# Patient Record
Sex: Female | Born: 1945 | Race: Black or African American | Hispanic: No | Marital: Married | Smoking: Former smoker
Health system: Southern US, Community
[De-identification: ages and names within clinical notes are randomized; demographics above are authoritative.]

## PROBLEM LIST (undated history)

## (undated) DIAGNOSIS — C3490 Malignant neoplasm of unspecified part of unspecified bronchus or lung: Secondary | ICD-10-CM

## (undated) DIAGNOSIS — C140 Malignant neoplasm of pharynx, unspecified: Secondary | ICD-10-CM

## (undated) DIAGNOSIS — I10 Essential (primary) hypertension: Secondary | ICD-10-CM

## (undated) DIAGNOSIS — N189 Chronic kidney disease, unspecified: Secondary | ICD-10-CM

## (undated) DIAGNOSIS — I4891 Unspecified atrial fibrillation: Secondary | ICD-10-CM

## (undated) DIAGNOSIS — D649 Anemia, unspecified: Secondary | ICD-10-CM

## (undated) HISTORY — DX: Chronic kidney disease, unspecified: N18.9

## (undated) HISTORY — DX: Malignant neoplasm of unspecified part of unspecified bronchus or lung: C34.90

## (undated) HISTORY — PX: OTHER SURGICAL HISTORY: SHX169

## (undated) HISTORY — DX: Unspecified atrial fibrillation: I48.91

---

## 1997-11-27 ENCOUNTER — Other Ambulatory Visit: Admission: RE | Admit: 1997-11-27 | Discharge: 1997-11-27 | Payer: Self-pay | Admitting: Obstetrics & Gynecology

## 1998-12-10 ENCOUNTER — Other Ambulatory Visit: Admission: RE | Admit: 1998-12-10 | Discharge: 1998-12-10 | Payer: Self-pay | Admitting: Internal Medicine

## 1999-10-18 ENCOUNTER — Ambulatory Visit (HOSPITAL_COMMUNITY): Admission: RE | Admit: 1999-10-18 | Discharge: 1999-10-18 | Payer: Self-pay | Admitting: Internal Medicine

## 1999-10-18 ENCOUNTER — Encounter: Payer: Self-pay | Admitting: Internal Medicine

## 1999-12-10 ENCOUNTER — Other Ambulatory Visit: Admission: RE | Admit: 1999-12-10 | Discharge: 1999-12-10 | Payer: Self-pay | Admitting: Internal Medicine

## 2001-02-12 ENCOUNTER — Other Ambulatory Visit: Admission: RE | Admit: 2001-02-12 | Discharge: 2001-02-12 | Payer: Self-pay | Admitting: Internal Medicine

## 2005-02-07 ENCOUNTER — Ambulatory Visit (HOSPITAL_COMMUNITY): Admission: RE | Admit: 2005-02-07 | Discharge: 2005-02-07 | Payer: Self-pay | Admitting: Internal Medicine

## 2010-08-28 ENCOUNTER — Encounter: Payer: Self-pay | Admitting: Nephrology

## 2010-11-09 ENCOUNTER — Emergency Department (HOSPITAL_COMMUNITY): Payer: Medicare Other

## 2010-11-09 ENCOUNTER — Inpatient Hospital Stay (HOSPITAL_COMMUNITY)
Admission: EM | Admit: 2010-11-09 | Discharge: 2010-11-29 | DRG: 207 | Disposition: A | Discharge status: Skilled Nursing Facility | Payer: Medicare Other | Attending: Emergency Medicine | Admitting: Emergency Medicine

## 2010-11-09 DIAGNOSIS — G589 Mononeuropathy, unspecified: Secondary | ICD-10-CM | POA: Diagnosis present

## 2010-11-09 DIAGNOSIS — N179 Acute kidney failure, unspecified: Secondary | ICD-10-CM | POA: Diagnosis present

## 2010-11-09 DIAGNOSIS — J90 Pleural effusion, not elsewhere classified: Secondary | ICD-10-CM | POA: Diagnosis present

## 2010-11-09 DIAGNOSIS — I4891 Unspecified atrial fibrillation: Secondary | ICD-10-CM

## 2010-11-09 DIAGNOSIS — E872 Acidosis, unspecified: Secondary | ICD-10-CM | POA: Diagnosis present

## 2010-11-09 DIAGNOSIS — C3490 Malignant neoplasm of unspecified part of unspecified bronchus or lung: Secondary | ICD-10-CM | POA: Diagnosis present

## 2010-11-09 DIAGNOSIS — I498 Other specified cardiac arrhythmias: Secondary | ICD-10-CM | POA: Diagnosis not present

## 2010-11-09 DIAGNOSIS — J9600 Acute respiratory failure, unspecified whether with hypoxia or hypercapnia: Secondary | ICD-10-CM | POA: Diagnosis not present

## 2010-11-09 DIAGNOSIS — E876 Hypokalemia: Secondary | ICD-10-CM | POA: Diagnosis present

## 2010-11-09 DIAGNOSIS — R197 Diarrhea, unspecified: Secondary | ICD-10-CM | POA: Diagnosis present

## 2010-11-09 DIAGNOSIS — I5020 Unspecified systolic (congestive) heart failure: Secondary | ICD-10-CM | POA: Diagnosis present

## 2010-11-09 DIAGNOSIS — R222 Localized swelling, mass and lump, trunk: Secondary | ICD-10-CM | POA: Diagnosis present

## 2010-11-09 DIAGNOSIS — A419 Sepsis, unspecified organism: Secondary | ICD-10-CM | POA: Diagnosis not present

## 2010-11-09 DIAGNOSIS — I10 Essential (primary) hypertension: Secondary | ICD-10-CM | POA: Diagnosis present

## 2010-11-09 DIAGNOSIS — D649 Anemia, unspecified: Secondary | ICD-10-CM | POA: Diagnosis present

## 2010-11-09 DIAGNOSIS — E46 Unspecified protein-calorie malnutrition: Secondary | ICD-10-CM | POA: Diagnosis present

## 2010-11-09 DIAGNOSIS — D696 Thrombocytopenia, unspecified: Secondary | ICD-10-CM | POA: Diagnosis present

## 2010-11-09 DIAGNOSIS — F410 Panic disorder [episodic paroxysmal anxiety] without agoraphobia: Secondary | ICD-10-CM | POA: Diagnosis not present

## 2010-11-09 DIAGNOSIS — R5381 Other malaise: Secondary | ICD-10-CM | POA: Diagnosis present

## 2010-11-09 DIAGNOSIS — F17200 Nicotine dependence, unspecified, uncomplicated: Secondary | ICD-10-CM | POA: Diagnosis present

## 2010-11-09 DIAGNOSIS — R6521 Severe sepsis with septic shock: Secondary | ICD-10-CM | POA: Diagnosis not present

## 2010-11-09 DIAGNOSIS — I509 Heart failure, unspecified: Secondary | ICD-10-CM | POA: Diagnosis present

## 2010-11-09 DIAGNOSIS — J189 Pneumonia, unspecified organism: Principal | ICD-10-CM | POA: Diagnosis present

## 2010-11-09 DIAGNOSIS — R634 Abnormal weight loss: Secondary | ICD-10-CM | POA: Diagnosis present

## 2010-11-09 HISTORY — DX: Malignant neoplasm of pharynx, unspecified: C14.0

## 2010-11-09 HISTORY — DX: Anemia, unspecified: D64.9

## 2010-11-09 HISTORY — DX: Essential (primary) hypertension: I10

## 2010-11-09 LAB — OCCULT BLOOD, POC DEVICE: Fecal Occult Bld: NEGATIVE

## 2010-11-09 LAB — BASIC METABOLIC PANEL
BUN: 28 mg/dL — ABNORMAL HIGH (ref 6–23)
CO2: 17 mEq/L — ABNORMAL LOW (ref 19–32)
CO2: 15 mEq/L — ABNORMAL LOW (ref 19–32)
Calcium: 6.6 mg/dL — ABNORMAL LOW (ref 8.4–10.5)
Chloride: 106 mEq/L (ref 96–112)
Chloride: 116 mEq/L — ABNORMAL HIGH (ref 96–112)
Creatinine, Ser: 1.72 mg/dL — ABNORMAL HIGH (ref 0.4–1.2)
Creatinine, Ser: 1.59 mg/dL — ABNORMAL HIGH (ref 0.4–1.2)
GFR calc Af Amer: 36 mL/min — ABNORMAL LOW (ref >60)
GFR calc Af Amer: 39 mL/min — ABNORMAL LOW (ref >60)
GFR calc non Af Amer: 32 mL/min — ABNORMAL LOW (ref >60)
Potassium: <2 mEq/L — CL (ref 3.5–5.1)
Potassium: 2.4 mEq/L — CL (ref 3.5–5.1)
Sodium: 138 mEq/L (ref 135–145)
Sodium: 138 mEq/L (ref 135–145)

## 2010-11-09 LAB — DIFFERENTIAL
Basophils Absolute: 0 10*3/uL (ref 0.0–0.1)
Basophils Relative: 0 % (ref 0–1)
Eosinophils Absolute: 0 10*3/uL (ref 0.0–0.7)
Eosinophils Relative: 0 % (ref 0–5)
Lymphocytes Relative: 30 % (ref 12–46)
Lymphs Abs: 2.1 10*3/uL (ref 0.7–4.0)
Monocytes Absolute: 1 10*3/uL (ref 0.1–1.0)
Monocytes Relative: 14 % — ABNORMAL HIGH (ref 3–12)
Neutro Abs: 3.8 10*3/uL (ref 1.7–7.7)
Neutrophils Relative %: 55 % (ref 43–77)

## 2010-11-09 LAB — CBC
HCT: 26.2 % — ABNORMAL LOW (ref 36.0–46.0)
Hemoglobin: 8.5 g/dL — ABNORMAL LOW (ref 12.0–15.0)
MCH: 29.8 pg (ref 26.0–34.0)
MCHC: 32.4 g/dL (ref 30.0–36.0)
MCV: 91.9 fL (ref 78.0–100.0)
Platelets: 250 10*3/uL (ref 150–400)
RBC: 2.85 MIL/uL — ABNORMAL LOW (ref 3.87–5.11)
RDW: 17.7 % — ABNORMAL HIGH (ref 11.5–15.5)
WBC: 6.9 10*3/uL (ref 4.0–10.5)

## 2010-11-09 LAB — HEPATIC FUNCTION PANEL
ALT: 17 U/L (ref 0–35)
AST: 31 U/L (ref 0–37)
Indirect Bilirubin: 0.3 mg/dL (ref 0.3–0.9)
Total Protein: 7.1 g/dL (ref 6.0–8.3)

## 2010-11-09 LAB — TSH: TSH: 1.612 u[IU]/mL (ref 0.350–4.500)

## 2010-11-09 LAB — T4, FREE: Free T4: 1.43 ng/dL (ref 0.80–1.80)

## 2010-11-09 LAB — MRSA PCR SCREENING: MRSA by PCR: NEGATIVE

## 2010-11-10 ENCOUNTER — Inpatient Hospital Stay (HOSPITAL_COMMUNITY): Payer: Medicare Other

## 2010-11-10 LAB — BASIC METABOLIC PANEL
BUN: 18 mg/dL (ref 6–23)
CO2: 14 mEq/L — ABNORMAL LOW (ref 19–32)
Calcium: 6.4 mg/dL — CL (ref 8.4–10.5)
Calcium: 6.9 mg/dL — ABNORMAL LOW (ref 8.4–10.5)
Chloride: 119 mEq/L — ABNORMAL HIGH (ref 96–112)
Chloride: 118 mEq/L — ABNORMAL HIGH (ref 96–112)
Creatinine, Ser: 1.48 mg/dL — ABNORMAL HIGH (ref 0.4–1.2)
GFR calc Af Amer: 40 mL/min — ABNORMAL LOW (ref >60)
GFR calc Af Amer: 43 mL/min — ABNORMAL LOW (ref >60)
GFR calc Af Amer: 45 mL/min — ABNORMAL LOW (ref >60)
GFR calc non Af Amer: 33 mL/min — ABNORMAL LOW (ref >60)
GFR calc non Af Amer: 35 mL/min — ABNORMAL LOW (ref >60)
Glucose, Bld: 92 mg/dL (ref 70–99)
Potassium: 2.2 mEq/L — CL (ref 3.5–5.1)
Potassium: 2.3 mEq/L — CL (ref 3.5–5.1)
Potassium: 2.8 mEq/L — ABNORMAL LOW (ref 3.5–5.1)
Sodium: 139 mEq/L (ref 135–145)
Sodium: 141 mEq/L (ref 135–145)

## 2010-11-10 LAB — ALBUMIN: Albumin: 1.7 g/dL — ABNORMAL LOW (ref 3.5–5.2)

## 2010-11-10 LAB — CBC
HCT: 28.4 % — ABNORMAL LOW (ref 36.0–46.0)
Hemoglobin: 6.4 g/dL — CL (ref 12.0–15.0)
Hemoglobin: 9.6 g/dL — ABNORMAL LOW (ref 12.0–15.0)
Platelets: 194 10*3/uL (ref 150–400)
RBC: 2.11 MIL/uL — ABNORMAL LOW (ref 3.87–5.11)
RBC: 3.26 MIL/uL — ABNORMAL LOW (ref 3.87–5.11)
WBC: 8.5 10*3/uL (ref 4.0–10.5)

## 2010-11-10 LAB — GIARDIA/CRYPTOSPORIDIUM SCREEN(EIA): Cryptosporidium Screen (EIA): NEGATIVE

## 2010-11-10 LAB — MAGNESIUM: Magnesium: 1.5 mg/dL (ref 1.5–2.5)

## 2010-11-10 NOTE — H&P (Signed)
  NAME:  Madison [name], Madison [name]                 ACCOUNT NO.:  [id number]  MEDICAL RECORD NO.:  [id number]           PATIENT TYPE:  E  LOCATION:  [facility]                         FACILITY:  [facility]  PHYSICIAN:  Mauro Kaufmann, MD         DATE OF BIRTH:  [birth year]  DATE OF ADMISSION:  11/09/2010 DATE OF DISCHARGE:                             HISTORY & PHYSICAL   ADDENDUM: Please make a note that, the patient has been having cough for past 2 weeks, but denies any fever and so once the patient's CAT scan is done and it is confirmed that she has mass and not pneumonia, we will get Pulmonary consultation.  If the CT scan shows pneumonia, then we will put the patient on antibiotics.     Mauro Kaufmann, MD     GL/MEDQ  D:  11/09/2010  T:  11/09/2010  Job:  [id number]  Electronically Signed by Mauro Kaufmann  on 11/10/2010 02:56:26 PM

## 2010-11-10 NOTE — H&P (Signed)
NAMESERA, HITSMAN                 ACCOUNT NO.:  [id number]  MEDICAL RECORD NO.:  [id number]           PATIENT TYPE:  E  LOCATION:  [facility]                         FACILITY:  [facility]  PHYSICIAN:  Mauro Kaufmann, MD         DATE OF BIRTH:  14-Jul-1946  DATE OF ADMISSION:  11/09/2010 DATE OF DISCHARGE:                             HISTORY & PHYSICAL   PRIMARY CARE PHYSICIAN:  Halford Chessman at [facility].  CHIEF COMPLAINT:  Abnormal labs.  HISTORY OF PRESENT ILLNESS:  This is a 65 year old female who was sent to the ER because of abnormal labs.  The patient was seen by her primary care yesterday and the lab work was done which showed a potassium of 2.0 and the patient was on call to come to the hospital so the patient came to the hospital.  The patient tells me that she has been on hydrochlorothiazide for the hypertension along with amlodipine, Cozaar, losartan by the primary care physician.  The hydrochlorothiazide which could have resulted in the hypokalemia was discontinued yesterday.  The patient also has a history of chronic watery diarrhea and unintentional weight loss going on for past 5 months.  The patient says that she has usually has 4-5 loose bowel movements every day and she blames this diarrhea for the unintentional weight loss.  The patient says that she has lost about 20 pounds of weight since December of last year.  Denies any complaints.  No chest pain.  No shortness of breath.  No nausea, vomiting.  No headache.  No blurred vision.  No passing out spell.  No dysuria, urgency. No frequency of urination.  No cough.  No shortness of breath.  PAST MEDICAL HISTORY: 1. Significant for hypertension. 2. Anemia. 3. Neuropathy.  PAST SURGICAL HISTORY:  None.  SOCIAL HISTORY:  The patient smokes one pack of cigarettes per day.  No history of alcohol abuse.  No history of illicit drug abuse.  FAMILY HISTORY:  There is positive family history of coronary artery disease in  both the parents and the patient's first cousin has a history of cancer.  The patient is not sure which cancer.  ALLERGIES:  LISINOPRIL WHICH CAUSES ANGIOEDEMA.  MEDICATIONS: 1. Amlodipine 10 mg p.o. daily. 2. Cozaar 100 mg p.o. daily. 3. Losartan 10 mg p.o. daily. 4. Folic acid 2 mg p.o. daily. 5. Multivitamin 1 p.o. daily. 6. Vitamin B1 1000 units once a day.  REVIEW OF SYSTEMS:  HEENT:  There is no headache, no blurred vision.  No runny nose or sore throat. NECK:  No history of thyroid problems. CHEST:  No history of COPD or emphysema. HEART:  No history of CAD in the past. GI: As in HPI. GENITOURINARY:  No dysuria, urgency, frequency of urination. NEUROLOGICAL:  No history of stroke or TIA in the past.  PHYSICAL EXAMINATION:  VITAL SIGNS:  The patient's blood pressure is 95/54, temperature is 98.0, pulse of 94, respiration is 19, O2 sat 100%. HEENT:  Head is atraumatic, normocephalic.  Eyes: Extraocular muscles intact.  Oral mucosa is pink and moist. NECK:  Supple. CHEST:  Clear to auscultation bilaterally. HEART:  S1 and S2 is regular.  No murmurs, rubs, or gallops. ABDOMEN:  Soft, nontender.  No organomegaly. EXTREMITIES:  No cyanosis or clubbing or edema. NEUROLOGICAL:  Cranial nerves II-XII grossly intact.  Motor strength is 5/5 in both upper and lower extremities.  Sensations are intact.  PERTINENT LABS:  WBCs 6.9, hemoglobin 8.5, hematocrit 26.2, platelet count 250.  Sodium is 138, potassium less than 2.0, chloride 106, CO2 19, BUN 28, creatinine 2.10, glucose is 89, AST 31, ALT 17, calcium is 7.6, albumin is 2.4.  Stool for occult blood is negative.  EKG shows atrial fibrillation with a rate of 79-86.  ASSESSMENT: 1. New onset atrial fibrillation. 2. Hypokalemia. 3. Lung mass. 4. Chronic diarrhea. 5. History of hypertension. 6. Weight loss. 7. Anemia. 8. Acute renal failure. 9. Tobacco abuse.  PLAN: 1. New onset atrial fibrillation.  The patient did not  have AFib in     the past and this is a new onset.  So at this time the rate is     pretty well controlled so I am going to start the patient on p.o.     Cardizem and start the patient on Lovenox 1 mg/kg subcu q.12 h.  I     called the cardiology consultation.  The AFib at this time could     have been triggered by the low potassium so the potassium will be     replaced. 2. Hypokalemia.  The patient's potassium is less than 2.0.  We will     give her 10 mEq IV x3 and also repeat a BMET today and also check     her magnesium levels. 3. Questionable lung mass.  The chest x-ray done today shows right     hilar enlargement and wedge-shaped anterior right upper lobe     airspace disease.  This could reflect combination of acute     pneumonia with right hilar lymphadenopathy or alternatively     bronchogenic carcinoma.  The patient does not have any sinus     symptoms of pneumonia at this time.  Her white count is normal.     She did not have any fever and with her history of 20 pounds of     weight loss to over past 4 months the bronchogenic carcinoma is     very likely.  So the patient will need a CT of the chest with     contrast, but unfortunately her creatinine is elevated this time so     we cannot obtain CT till the creatinine is stabilized.  I am going     to repeat a BMET and put the order for a CT of the chest with     contrast once the creatinine is less than 1.5.  The patient will go     down for CAT of the chest to rule out any bronchogenic carcinoma.     In the meantime, the patient will be admitted to the step-down unit     as the patient is mildly hypotensive, which most likely is     secondary to dehydration.  I am not going to start the antibiotics     till I get the CAT scan of the chest report showing definite     pneumonia which I doubt at this time. 4. Chronic diarrhea.  The patient has been having diarrhea for last 4     months and we are going to obtain the  stool  studies also check the     PCR C diff.  The patient may need a CT of the abdomen and pelvis     later once the creatinine is stabilized and other acute issues are     taken care of. 5. History of hypertension.  At this time the patient is hypotensive,     so we are going to hold all the antihypertensive medications.  The     patient was taking hydrochlorothiazide which was recently     discontinued by the patient's primary care provider. 6. Weight-loss.  This is an unintentional weight loss and all could be     explained due to the bronchogenic carcinoma.  We will wait for the     CT of the chest.  If the CT of the chest does not show bronchogenic     carcinoma, then further evaluation has to be done for the patient's     chronic diarrhea and further imaging of the abdomen will be done as     necessary. 7. Anemia.  The patient's stool for occult blood is negative in the     ER.  We are going to obtain 3 more stool for occult blood and this     anemia could be all anemia of chronic disease secondary to the     cancer. 8. Acute renal failure.  The patient will be started on IV fluids for     the acute renal failure.  We will check the patient's BMET in the     a.m.     Mauro Kaufmann, MD     GL/MEDQ  D:  11/09/2010  T:  11/09/2010  Job:  [id number]  Electronically Signed by Mauro Kaufmann  on 11/10/2010 02:56:20 PM

## 2010-11-11 ENCOUNTER — Other Ambulatory Visit: Payer: Self-pay | Admitting: Emergency Medicine

## 2010-11-11 ENCOUNTER — Inpatient Hospital Stay (HOSPITAL_COMMUNITY): Payer: Medicare Other

## 2010-11-11 DIAGNOSIS — C3490 Malignant neoplasm of unspecified part of unspecified bronchus or lung: Secondary | ICD-10-CM

## 2010-11-11 DIAGNOSIS — E871 Hypo-osmolality and hyponatremia: Secondary | ICD-10-CM

## 2010-11-11 DIAGNOSIS — R222 Localized swelling, mass and lump, trunk: Secondary | ICD-10-CM

## 2010-11-11 DIAGNOSIS — E782 Mixed hyperlipidemia: Secondary | ICD-10-CM

## 2010-11-11 DIAGNOSIS — J189 Pneumonia, unspecified organism: Secondary | ICD-10-CM

## 2010-11-11 DIAGNOSIS — J9600 Acute respiratory failure, unspecified whether with hypoxia or hypercapnia: Secondary | ICD-10-CM

## 2010-11-11 DIAGNOSIS — I059 Rheumatic mitral valve disease, unspecified: Secondary | ICD-10-CM

## 2010-11-11 HISTORY — DX: Malignant neoplasm of unspecified part of unspecified bronchus or lung: C34.90

## 2010-11-11 LAB — GLUCOSE, CAPILLARY
Glucose-Capillary: 160 mg/dL — ABNORMAL HIGH (ref 70–99)
Glucose-Capillary: 320 mg/dL — ABNORMAL HIGH (ref 70–99)
Glucose-Capillary: 285 mg/dL — ABNORMAL HIGH (ref 70–99)

## 2010-11-11 LAB — BASIC METABOLIC PANEL
BUN: 10 mg/dL (ref 6–23)
CO2: 11 mEq/L — ABNORMAL LOW (ref 19–32)
Chloride: 120 mEq/L — ABNORMAL HIGH (ref 96–112)
Chloride: 119 mEq/L — ABNORMAL HIGH (ref 96–112)
Chloride: 116 mEq/L — ABNORMAL HIGH (ref 96–112)
GFR calc Af Amer: 55 mL/min — ABNORMAL LOW (ref >60)
GFR calc Af Amer: 54 mL/min — ABNORMAL LOW (ref >60)
GFR calc non Af Amer: 45 mL/min — ABNORMAL LOW (ref >60)
GFR calc non Af Amer: 41 mL/min — ABNORMAL LOW (ref >60)
Glucose, Bld: 133 mg/dL — ABNORMAL HIGH (ref 70–99)
Glucose, Bld: 361 mg/dL — ABNORMAL HIGH (ref 70–99)
Potassium: 4.1 mEq/L (ref 3.5–5.1)
Potassium: 4.5 mEq/L (ref 3.5–5.1)
Potassium: 4.3 mEq/L (ref 3.5–5.1)
Sodium: 139 mEq/L (ref 135–145)
Sodium: 137 mEq/L (ref 135–145)

## 2010-11-11 LAB — BLOOD GAS, ARTERIAL
Acid-base deficit: 17.1 mmol/L — ABNORMAL HIGH (ref 0.0–2.0)
Drawn by: 277551
O2 Content: 2 L/min
O2 Saturation: 83.7 %
TCO2: 9.5 mmol/L (ref 0–100)
pCO2 arterial: 20.5 mmHg — ABNORMAL LOW (ref 35.0–45.0)
pO2, Arterial: 57 mmHg — ABNORMAL LOW (ref 80.0–100.0)

## 2010-11-11 LAB — COMPREHENSIVE METABOLIC PANEL
AST: 38 U/L — ABNORMAL HIGH (ref 0–37)
Albumin: 2.7 g/dL — ABNORMAL LOW (ref 3.5–5.2)
Alkaline Phosphatase: 66 U/L (ref 39–117)
BUN: 10 mg/dL (ref 6–23)
CO2: 13 mEq/L — ABNORMAL LOW (ref 19–32)
Chloride: 118 mEq/L — ABNORMAL HIGH (ref 96–112)
Creatinine, Ser: 1.3 mg/dL — ABNORMAL HIGH (ref 0.4–1.2)
GFR calc Af Amer: 50 mL/min — ABNORMAL LOW (ref >60)
GFR calc non Af Amer: 41 mL/min — ABNORMAL LOW (ref >60)
Potassium: 2.9 mEq/L — ABNORMAL LOW (ref 3.5–5.1)
Total Bilirubin: 1.1 mg/dL (ref 0.3–1.2)

## 2010-11-11 LAB — CBC
HCT: 29.4 % — ABNORMAL LOW (ref 36.0–46.0)
Hemoglobin: 10.1 g/dL — ABNORMAL LOW (ref 12.0–15.0)
Hemoglobin: 9.8 g/dL — ABNORMAL LOW (ref 12.0–15.0)
MCH: 29.7 pg (ref 26.0–34.0)
MCH: 29.6 pg (ref 26.0–34.0)
MCV: 87.6 fL (ref 78.0–100.0)
MCV: 88.8 fL (ref 78.0–100.0)
Platelets: 213 10*3/uL (ref 150–400)
RBC: 3.4 MIL/uL — ABNORMAL LOW (ref 3.87–5.11)
RBC: 3.31 MIL/uL — ABNORMAL LOW (ref 3.87–5.11)
WBC: 8.8 10*3/uL (ref 4.0–10.5)
WBC: 15.3 10*3/uL — ABNORMAL HIGH (ref 4.0–10.5)

## 2010-11-11 LAB — POCT I-STAT 3, ART BLOOD GAS (G3+)
Acid-base deficit: 17 mmol/L — ABNORMAL HIGH (ref 0.0–2.0)
O2 Saturation: 88 %
Patient temperature: 35
TCO2: 11 mmol/L (ref 0–100)

## 2010-11-11 LAB — APTT: aPTT: 33 seconds (ref 24–37)

## 2010-11-11 LAB — IRON AND TIBC
Iron: 35 ug/dL — ABNORMAL LOW (ref 42–135)
Saturation Ratios: 31 % (ref 20–55)
UIBC: 79 ug/dL

## 2010-11-11 LAB — CARBOXYHEMOGLOBIN
Methemoglobin: 0.7 % (ref 0.0–1.5)
Methemoglobin: 0.4 % (ref 0.0–1.5)

## 2010-11-11 LAB — CARDIAC PANEL(CRET KIN+CKTOT+MB+TROPI)
CK, MB: 2.5 ng/mL (ref 0.3–4.0)
Relative Index: INVALID (ref 0.0–2.5)
Troponin I: 0.11 ng/mL — ABNORMAL HIGH (ref 0.00–0.06)

## 2010-11-11 LAB — LACTIC ACID, PLASMA: Lactic Acid, Venous: 3.3 mmol/L — ABNORMAL HIGH (ref 0.5–2.2)

## 2010-11-11 LAB — FERRITIN: Ferritin: 824 ng/mL — ABNORMAL HIGH (ref 10–291)

## 2010-11-11 LAB — PROCALCITONIN: Procalcitonin: 0.6 ng/mL

## 2010-11-11 MED ORDER — TECHNETIUM TO 99M ALBUMIN AGGREGATED
6.0000 | Freq: Once | INTRAVENOUS | Status: AC | PRN
  Administered 2010-11-11: 6 via INTRAVENOUS

## 2010-11-11 MED ORDER — XENON XE 133 GAS
10.0000 | GAS_FOR_INHALATION | Freq: Once | RESPIRATORY_TRACT | Status: AC | PRN
  Administered 2010-11-11: 10 via RESPIRATORY_TRACT

## 2010-11-12 ENCOUNTER — Inpatient Hospital Stay (HOSPITAL_COMMUNITY): Payer: Medicare Other

## 2010-11-12 LAB — BLOOD GAS, ARTERIAL
Acid-base deficit: 11.8 mmol/L — ABNORMAL HIGH (ref 0.0–2.0)
Drawn by: 31101
FIO2: 0.6 %
MECHVT: 500 mL
O2 Saturation: 89.4 %
PEEP: 5 cmH2O
RATE: 22 resp/min
pCO2 arterial: 26.2 mmHg — ABNORMAL LOW (ref 35.0–45.0)
pO2, Arterial: 61.2 mmHg — ABNORMAL LOW (ref 80.0–100.0)

## 2010-11-12 LAB — BASIC METABOLIC PANEL
BUN: 10 mg/dL (ref 6–23)
CO2: 18 mEq/L — ABNORMAL LOW (ref 19–32)
Calcium: 6.4 mg/dL — CL (ref 8.4–10.5)
Calcium: 6.2 mg/dL — CL (ref 8.4–10.5)
Chloride: 113 mEq/L — ABNORMAL HIGH (ref 96–112)
Creatinine, Ser: 1.21 mg/dL — ABNORMAL HIGH (ref 0.4–1.2)
Creatinine, Ser: 1.06 mg/dL (ref 0.4–1.2)
GFR calc Af Amer: >60 mL/min (ref >60)
GFR calc non Af Amer: 45 mL/min — ABNORMAL LOW (ref >60)
Glucose, Bld: 126 mg/dL — ABNORMAL HIGH (ref 70–99)
Sodium: 135 mEq/L (ref 135–145)

## 2010-11-12 LAB — GLUCOSE, CAPILLARY
Glucose-Capillary: 113 mg/dL — ABNORMAL HIGH (ref 70–99)
Glucose-Capillary: 162 mg/dL — ABNORMAL HIGH (ref 70–99)
Glucose-Capillary: 126 mg/dL — ABNORMAL HIGH (ref 70–99)

## 2010-11-12 LAB — CBC
HCT: 27.5 % — ABNORMAL LOW (ref 36.0–46.0)
Hemoglobin: 9.3 g/dL — ABNORMAL LOW (ref 12.0–15.0)
MCH: 29.5 pg (ref 26.0–34.0)
MCH: 29.7 pg (ref 26.0–34.0)
MCHC: 33.1 g/dL (ref 30.0–36.0)
MCV: 89.3 fL (ref 78.0–100.0)
Platelets: 172 10*3/uL (ref 150–400)
Platelets: 168 10*3/uL (ref 150–400)
RBC: 3.13 MIL/uL — ABNORMAL LOW (ref 3.87–5.11)
RDW: 19.3 % — ABNORMAL HIGH (ref 11.5–15.5)
WBC: 14 10*3/uL — ABNORMAL HIGH (ref 4.0–10.5)

## 2010-11-12 LAB — LACTIC ACID, PLASMA: Lactic Acid, Venous: 4.8 mmol/L — ABNORMAL HIGH (ref 0.5–2.2)

## 2010-11-12 LAB — CARBOXYHEMOGLOBIN
Carboxyhemoglobin: 0.5 % (ref 0.5–1.5)
Methemoglobin: 0.6 % (ref 0.0–1.5)
O2 Saturation: 65 %
O2 Saturation: 63.5 %
Total hemoglobin: 8.4 g/dL — ABNORMAL LOW (ref 12.5–16.0)

## 2010-11-12 LAB — CALCIUM, IONIZED: Calcium, Ion: 0.97 mmol/L — ABNORMAL LOW (ref 1.12–1.32)

## 2010-11-13 ENCOUNTER — Inpatient Hospital Stay (HOSPITAL_COMMUNITY): Payer: Medicare Other

## 2010-11-13 LAB — STOOL CULTURE

## 2010-11-13 LAB — BASIC METABOLIC PANEL
BUN: 8 mg/dL (ref 6–23)
Calcium: 6.3 mg/dL — CL (ref 8.4–10.5)
Chloride: 104 mEq/L (ref 96–112)
Creatinine, Ser: 1.11 mg/dL (ref 0.4–1.2)
GFR calc Af Amer: 55 mL/min — ABNORMAL LOW (ref >60)
GFR calc non Af Amer: 46 mL/min — ABNORMAL LOW (ref >60)
Potassium: 5.2 mEq/L — ABNORMAL HIGH (ref 3.5–5.1)
Sodium: 130 mEq/L — ABNORMAL LOW (ref 135–145)

## 2010-11-13 LAB — POCT I-STAT 3, ART BLOOD GAS (G3+)
Acid-Base Excess: 6 mmol/L — ABNORMAL HIGH (ref 0.0–2.0)
Acid-base deficit: 8 mmol/L — ABNORMAL HIGH (ref 0.0–2.0)
Acid-base deficit: 6 mmol/L — ABNORMAL HIGH (ref 0.0–2.0)
O2 Saturation: 88 %
O2 Saturation: 89 %
TCO2: 20 mmol/L (ref 0–100)
pCO2 arterial: 26.1 mmHg — ABNORMAL LOW (ref 35.0–45.0)
pCO2 arterial: 34.5 mmHg — ABNORMAL LOW (ref 35.0–45.0)

## 2010-11-13 LAB — CBC
MCHC: 33.9 g/dL (ref 30.0–36.0)
RDW: 18.7 % — ABNORMAL HIGH (ref 11.5–15.5)
WBC: 15 10*3/uL — ABNORMAL HIGH (ref 4.0–10.5)

## 2010-11-13 LAB — GLUCOSE, CAPILLARY
Glucose-Capillary: 173 mg/dL — ABNORMAL HIGH (ref 70–99)
Glucose-Capillary: 172 mg/dL — ABNORMAL HIGH (ref 70–99)
Glucose-Capillary: 174 mg/dL — ABNORMAL HIGH (ref 70–99)
Glucose-Capillary: 174 mg/dL — ABNORMAL HIGH (ref 70–99)

## 2010-11-13 LAB — LACTIC ACID, PLASMA: Lactic Acid, Venous: 3 mmol/L — ABNORMAL HIGH (ref 0.5–2.2)

## 2010-11-14 ENCOUNTER — Inpatient Hospital Stay (HOSPITAL_COMMUNITY): Payer: Medicare Other

## 2010-11-14 LAB — CALCIUM, IONIZED: Calcium, Ion: 0.96 mmol/L — ABNORMAL LOW (ref 1.12–1.32)

## 2010-11-14 LAB — BASIC METABOLIC PANEL
BUN: 9 mg/dL (ref 6–23)
CO2: 19 mEq/L (ref 19–32)
Calcium: 6.3 mg/dL — CL (ref 8.4–10.5)
Chloride: 91 mEq/L — ABNORMAL LOW (ref 96–112)
GFR calc Af Amer: 57 mL/min — ABNORMAL LOW (ref >60)
GFR calc non Af Amer: 46 mL/min — ABNORMAL LOW (ref >60)
Glucose, Bld: 159 mg/dL — ABNORMAL HIGH (ref 70–99)
Potassium: 4.7 mEq/L (ref 3.5–5.1)
Sodium: 121 mEq/L — ABNORMAL LOW (ref 135–145)

## 2010-11-14 LAB — DIC (DISSEMINATED INTRAVASCULAR COAGULATION)PANEL
D-Dimer, Quant: 3.17 ug/mL-FEU — ABNORMAL HIGH (ref 0.00–0.48)
Platelets: 62 10*3/uL — ABNORMAL LOW (ref 150–400)
Prothrombin Time: 16.2 seconds — ABNORMAL HIGH (ref 11.6–15.2)
aPTT: 41 seconds — ABNORMAL HIGH (ref 24–37)

## 2010-11-14 LAB — CULTURE, BAL-QUANTITATIVE W GRAM STAIN: Colony Count: 3000

## 2010-11-14 LAB — CROSSMATCH
ABO/RH(D): O POS
Unit division: 0
Unit division: 0

## 2010-11-14 LAB — CBC
MCH: 29.8 pg (ref 26.0–34.0)
MCHC: 34 g/dL (ref 30.0–36.0)
MCV: 87.7 fL (ref 78.0–100.0)
Platelets: 78 10*3/uL — ABNORMAL LOW (ref 150–400)
RDW: 17.9 % — ABNORMAL HIGH (ref 11.5–15.5)

## 2010-11-14 LAB — BLOOD GAS, ARTERIAL
Acid-base deficit: 2.1 mmol/L — ABNORMAL HIGH (ref 0.0–2.0)
Drawn by: 252031
FIO2: 0.6 %
MECHVT: 360 mL
O2 Saturation: 92.9 %
RATE: 35 resp/min
pCO2 arterial: 35.3 mmHg (ref 35.0–45.0)

## 2010-11-14 LAB — GLUCOSE, CAPILLARY
Glucose-Capillary: 147 mg/dL — ABNORMAL HIGH (ref 70–99)
Glucose-Capillary: 144 mg/dL — ABNORMAL HIGH (ref 70–99)
Glucose-Capillary: 112 mg/dL — ABNORMAL HIGH (ref 70–99)

## 2010-11-15 ENCOUNTER — Inpatient Hospital Stay (HOSPITAL_COMMUNITY): Payer: Medicare Other

## 2010-11-15 DIAGNOSIS — E871 Hypo-osmolality and hyponatremia: Secondary | ICD-10-CM

## 2010-11-15 DIAGNOSIS — J9600 Acute respiratory failure, unspecified whether with hypoxia or hypercapnia: Secondary | ICD-10-CM

## 2010-11-15 DIAGNOSIS — R222 Localized swelling, mass and lump, trunk: Secondary | ICD-10-CM

## 2010-11-15 DIAGNOSIS — E782 Mixed hyperlipidemia: Secondary | ICD-10-CM

## 2010-11-15 LAB — BASIC METABOLIC PANEL
CO2: 24 mEq/L (ref 19–32)
CO2: 22 mEq/L (ref 19–32)
Calcium: 6.6 mg/dL — ABNORMAL LOW (ref 8.4–10.5)
Calcium: 6.6 mg/dL — ABNORMAL LOW (ref 8.4–10.5)
Calcium: 6.2 mg/dL — CL (ref 8.4–10.5)
Chloride: 96 mEq/L (ref 96–112)
Creatinine, Ser: 1.13 mg/dL (ref 0.4–1.2)
Creatinine, Ser: 1.05 mg/dL (ref 0.4–1.2)
Creatinine, Ser: 1.07 mg/dL (ref 0.4–1.2)
GFR calc Af Amer: 58 mL/min — ABNORMAL LOW (ref >60)
GFR calc Af Amer: >60 mL/min (ref >60)
GFR calc non Af Amer: 48 mL/min — ABNORMAL LOW (ref >60)
Glucose, Bld: 101 mg/dL — ABNORMAL HIGH (ref 70–99)
Glucose, Bld: 97 mg/dL (ref 70–99)

## 2010-11-15 LAB — GLUCOSE, CAPILLARY
Glucose-Capillary: 105 mg/dL — ABNORMAL HIGH (ref 70–99)
Glucose-Capillary: 105 mg/dL — ABNORMAL HIGH (ref 70–99)
Glucose-Capillary: 107 mg/dL — ABNORMAL HIGH (ref 70–99)
Glucose-Capillary: 124 mg/dL — ABNORMAL HIGH (ref 70–99)
Glucose-Capillary: 97 mg/dL (ref 70–99)

## 2010-11-15 LAB — BLOOD GAS, ARTERIAL
Bicarbonate: 26.1 mEq/L — ABNORMAL HIGH (ref 20.0–24.0)
MECHVT: 360 mL
O2 Saturation: 94 %
PEEP: 5 cmH2O
Patient temperature: 98.6
RATE: 35 resp/min
pH, Arterial: 7.501 — ABNORMAL HIGH (ref 7.350–7.400)

## 2010-11-15 LAB — HEPATIC FUNCTION PANEL
Alkaline Phosphatase: 56 U/L (ref 39–117)
Indirect Bilirubin: 0.3 mg/dL (ref 0.3–0.9)
Total Bilirubin: 0.6 mg/dL (ref 0.3–1.2)
Total Protein: 4.1 g/dL — ABNORMAL LOW (ref 6.0–8.3)

## 2010-11-15 LAB — CBC
Hemoglobin: 8.3 g/dL — ABNORMAL LOW (ref 12.0–15.0)
MCH: 30.1 pg (ref 26.0–34.0)
MCHC: 35.2 g/dL (ref 30.0–36.0)
RDW: 16.8 % — ABNORMAL HIGH (ref 11.5–15.5)

## 2010-11-15 LAB — HAPTOGLOBIN: Haptoglobin: 93 mg/dL (ref 16–200)

## 2010-11-16 ENCOUNTER — Inpatient Hospital Stay (HOSPITAL_COMMUNITY): Payer: Medicare Other

## 2010-11-16 ENCOUNTER — Encounter (HOSPITAL_COMMUNITY): Payer: Self-pay | Admitting: Radiology

## 2010-11-16 LAB — BASIC METABOLIC PANEL
BUN: 11 mg/dL (ref 6–23)
CO2: 23 mEq/L (ref 19–32)
Calcium: 7.9 mg/dL — ABNORMAL LOW (ref 8.4–10.5)
Chloride: 89 mEq/L — ABNORMAL LOW (ref 96–112)
GFR calc non Af Amer: 48 mL/min — ABNORMAL LOW (ref >60)
Glucose, Bld: 92 mg/dL (ref 70–99)
Potassium: 3.7 mEq/L (ref 3.5–5.1)
Sodium: 125 mEq/L — ABNORMAL LOW (ref 135–145)
Sodium: 121 mEq/L — ABNORMAL LOW (ref 135–145)

## 2010-11-16 LAB — DIFFERENTIAL
Basophils Absolute: 0 10*3/uL (ref 0.0–0.1)
Eosinophils Absolute: 0 10*3/uL (ref 0.0–0.7)
Eosinophils Relative: 0 % (ref 0–5)

## 2010-11-16 LAB — GLUCOSE, CAPILLARY
Glucose-Capillary: 102 mg/dL — ABNORMAL HIGH (ref 70–99)
Glucose-Capillary: 82 mg/dL (ref 70–99)
Glucose-Capillary: 97 mg/dL (ref 70–99)

## 2010-11-16 LAB — CBC
HCT: 25.6 % — ABNORMAL LOW (ref 36.0–46.0)
Hemoglobin: 9 g/dL — ABNORMAL LOW (ref 12.0–15.0)
MCHC: 35.2 g/dL (ref 30.0–36.0)
Platelets: 68 10*3/uL — ABNORMAL LOW (ref 150–400)
RBC: 3.04 MIL/uL — ABNORMAL LOW (ref 3.87–5.11)
RDW: 16.4 % — ABNORMAL HIGH (ref 11.5–15.5)
WBC: 9 10*3/uL (ref 4.0–10.5)

## 2010-11-16 LAB — MAGNESIUM: Magnesium: 1.1 mg/dL — ABNORMAL LOW (ref 1.5–2.5)

## 2010-11-16 LAB — POCT I-STAT 3, ART BLOOD GAS (G3+)
Acid-Base Excess: 3 mmol/L — ABNORMAL HIGH (ref 0.0–2.0)
Patient temperature: 36.3

## 2010-11-16 MED ORDER — IOHEXOL 300 MG/ML  SOLN: 100.0000 mL | Freq: Once | INTRAMUSCULAR | Status: DC | PRN

## 2010-11-17 ENCOUNTER — Inpatient Hospital Stay (HOSPITAL_COMMUNITY): Payer: Medicare Other

## 2010-11-17 ENCOUNTER — Other Ambulatory Visit: Payer: Self-pay | Admitting: Internal Medicine

## 2010-11-17 LAB — CULTURE, BLOOD (ROUTINE X 2)
Culture  Setup Time: 201204052325
Culture: NO GROWTH

## 2010-11-17 LAB — POCT I-STAT 3, ART BLOOD GAS (G3+)
TCO2: 25 mmol/L (ref 0–100)
pCO2 arterial: 32.8 mmHg — ABNORMAL LOW (ref 35.0–45.0)
pH, Arterial: 7.474 — ABNORMAL HIGH (ref 7.350–7.400)
pO2, Arterial: 98 mmHg (ref 80.0–100.0)

## 2010-11-17 LAB — BODY FLUID CELL COUNT WITH DIFFERENTIAL: Total Nucleated Cell Count, Fluid: 27 cu mm (ref 0–1000)

## 2010-11-17 LAB — BASIC METABOLIC PANEL
CO2: 22 mEq/L (ref 19–32)
Calcium: 7.3 mg/dL — ABNORMAL LOW (ref 8.4–10.5)
Calcium: 8 mg/dL — ABNORMAL LOW (ref 8.4–10.5)
Chloride: 92 mEq/L — ABNORMAL LOW (ref 96–112)
Chloride: 90 mEq/L — ABNORMAL LOW (ref 96–112)
Creatinine, Ser: 1.3 mg/dL — ABNORMAL HIGH (ref 0.4–1.2)
Creatinine, Ser: 1.53 mg/dL — ABNORMAL HIGH (ref 0.4–1.2)
GFR calc Af Amer: 41 mL/min — ABNORMAL LOW (ref >60)
GFR calc Af Amer: 47 mL/min — ABNORMAL LOW (ref >60)
GFR calc Af Amer: 50 mL/min — ABNORMAL LOW (ref >60)
GFR calc non Af Amer: 36 mL/min — ABNORMAL LOW (ref >60)
GFR calc non Af Amer: 41 mL/min — ABNORMAL LOW (ref >60)
Glucose, Bld: 119 mg/dL — ABNORMAL HIGH (ref 70–99)
Potassium: 3.9 mEq/L (ref 3.5–5.1)
Potassium: 3.7 mEq/L (ref 3.5–5.1)
Sodium: 121 mEq/L — ABNORMAL LOW (ref 135–145)
Sodium: 123 mEq/L — ABNORMAL LOW (ref 135–145)
Sodium: 123 mEq/L — ABNORMAL LOW (ref 135–145)

## 2010-11-17 LAB — AMYLASE, BODY FLUID

## 2010-11-17 LAB — GLUCOSE, CAPILLARY
Glucose-Capillary: 99 mg/dL (ref 70–99)
Glucose-Capillary: 101 mg/dL — ABNORMAL HIGH (ref 70–99)
Glucose-Capillary: 113 mg/dL — ABNORMAL HIGH (ref 70–99)
Glucose-Capillary: 123 mg/dL — ABNORMAL HIGH (ref 70–99)

## 2010-11-17 LAB — TRIGLYCERIDES: Triglycerides: 62 mg/dL (ref <150)

## 2010-11-17 LAB — LACTATE DEHYDROGENASE: LDH: 209 U/L (ref 94–250)

## 2010-11-17 LAB — CBC
HCT: 30.9 % — ABNORMAL LOW (ref 36.0–46.0)
Hemoglobin: 10.6 g/dL — ABNORMAL LOW (ref 12.0–15.0)
RBC: 3.65 MIL/uL — ABNORMAL LOW (ref 3.87–5.11)

## 2010-11-17 LAB — MAGNESIUM: Magnesium: 2.2 mg/dL (ref 1.5–2.5)

## 2010-11-17 LAB — CHOLESTEROL, BODY FLUID: Cholesterol, Fluid: 11 mg/dL

## 2010-11-17 LAB — PHOSPHORUS: Phosphorus: 4.1 mg/dL (ref 2.3–4.6)

## 2010-11-17 LAB — CHOLESTEROL, TOTAL: Cholesterol: 96 mg/dL (ref 0–200)

## 2010-11-17 LAB — GLUCOSE, SEROUS FLUID

## 2010-11-17 LAB — TRIGLYCERIDES, BODY FLUIDS

## 2010-11-17 LAB — PROTEIN, BODY FLUID: Total protein, fluid: <3 g/dL

## 2010-11-18 ENCOUNTER — Inpatient Hospital Stay (HOSPITAL_COMMUNITY): Payer: Medicare Other

## 2010-11-18 DIAGNOSIS — C3490 Malignant neoplasm of unspecified part of unspecified bronchus or lung: Secondary | ICD-10-CM

## 2010-11-18 DIAGNOSIS — J9600 Acute respiratory failure, unspecified whether with hypoxia or hypercapnia: Secondary | ICD-10-CM

## 2010-11-18 DIAGNOSIS — R579 Shock, unspecified: Secondary | ICD-10-CM

## 2010-11-18 LAB — BASIC METABOLIC PANEL
BUN: 24 mg/dL — ABNORMAL HIGH (ref 6–23)
CO2: 25 mEq/L (ref 19–32)
CO2: 23 mEq/L (ref 19–32)
Calcium: 8.5 mg/dL (ref 8.4–10.5)
Calcium: 8.4 mg/dL (ref 8.4–10.5)
Calcium: 8.2 mg/dL — ABNORMAL LOW (ref 8.4–10.5)
Chloride: 106 mEq/L (ref 96–112)
Creatinine, Ser: 1.06 mg/dL (ref 0.4–1.2)
Creatinine, Ser: 1.53 mg/dL — ABNORMAL HIGH (ref 0.4–1.2)
Creatinine, Ser: 1.62 mg/dL — ABNORMAL HIGH (ref 0.4–1.2)
GFR calc Af Amer: >60 mL/min (ref >60)
GFR calc non Af Amer: 34 mL/min — ABNORMAL LOW (ref >60)
Glucose, Bld: 116 mg/dL — ABNORMAL HIGH (ref 70–99)
Glucose, Bld: 114 mg/dL — ABNORMAL HIGH (ref 70–99)
Potassium: 2.7 mEq/L — CL (ref 3.5–5.1)
Potassium: 5.6 mEq/L — ABNORMAL HIGH (ref 3.5–5.1)
Sodium: 128 mEq/L — ABNORMAL LOW (ref 135–145)
Sodium: 127 mEq/L — ABNORMAL LOW (ref 135–145)

## 2010-11-18 LAB — CBC
MCH: 30.2 pg (ref 26.0–34.0)
MCV: 83.7 fL (ref 78.0–100.0)
Platelets: 97 10*3/uL — ABNORMAL LOW (ref 150–400)
RBC: 2.95 MIL/uL — ABNORMAL LOW (ref 3.87–5.11)

## 2010-11-18 LAB — GLUCOSE, CAPILLARY
Glucose-Capillary: 125 mg/dL — ABNORMAL HIGH (ref 70–99)
Glucose-Capillary: 133 mg/dL — ABNORMAL HIGH (ref 70–99)

## 2010-11-18 LAB — PROTEIN C, TOTAL: Protein C, Total: 71 % — ABNORMAL LOW (ref 72–160)

## 2010-11-19 ENCOUNTER — Inpatient Hospital Stay (HOSPITAL_COMMUNITY): Payer: Medicare Other

## 2010-11-19 LAB — GLUCOSE, CAPILLARY: Glucose-Capillary: 117 mg/dL — ABNORMAL HIGH (ref 70–99)

## 2010-11-19 LAB — BASIC METABOLIC PANEL
BUN: 27 mg/dL — ABNORMAL HIGH (ref 6–23)
CO2: 26 mEq/L (ref 19–32)
Calcium: 8.3 mg/dL — ABNORMAL LOW (ref 8.4–10.5)
Calcium: 8.1 mg/dL — ABNORMAL LOW (ref 8.4–10.5)
Chloride: 94 mEq/L — ABNORMAL LOW (ref 96–112)
Creatinine, Ser: 1.55 mg/dL — ABNORMAL HIGH (ref 0.4–1.2)
GFR calc Af Amer: 40 mL/min — ABNORMAL LOW (ref >60)
GFR calc Af Amer: 41 mL/min — ABNORMAL LOW (ref >60)
GFR calc non Af Amer: 32 mL/min — ABNORMAL LOW (ref >60)
GFR calc non Af Amer: 33 mL/min — ABNORMAL LOW (ref >60)
GFR calc non Af Amer: 34 mL/min — ABNORMAL LOW (ref >60)
Glucose, Bld: 122 mg/dL — ABNORMAL HIGH (ref 70–99)
Potassium: 3.5 mEq/L (ref 3.5–5.1)
Potassium: 3.6 mEq/L (ref 3.5–5.1)
Sodium: 131 mEq/L — ABNORMAL LOW (ref 135–145)

## 2010-11-19 LAB — CBC
HCT: 23 % — ABNORMAL LOW (ref 36.0–46.0)
MCHC: 35.7 g/dL (ref 30.0–36.0)
MCV: 84.6 fL (ref 78.0–100.0)
RDW: 15.8 % — ABNORMAL HIGH (ref 11.5–15.5)

## 2010-11-20 ENCOUNTER — Inpatient Hospital Stay (HOSPITAL_COMMUNITY): Payer: Medicare Other

## 2010-11-20 LAB — BLOOD GAS, ARTERIAL
Acid-Base Excess: 7.2 mmol/L — ABNORMAL HIGH (ref 0.0–2.0)
Bicarbonate: 30.7 mEq/L — ABNORMAL HIGH (ref 20.0–24.0)
FIO2: 0.3 %
TCO2: 31.9 mmol/L (ref 0–100)
pCO2 arterial: 39.5 mmHg (ref 35.0–45.0)
pO2, Arterial: 105 mmHg — ABNORMAL HIGH (ref 80.0–100.0)

## 2010-11-20 LAB — CBC
MCV: 85.6 fL (ref 78.0–100.0)
Platelets: 115 10*3/uL — ABNORMAL LOW (ref 150–400)
RDW: 15.9 % — ABNORMAL HIGH (ref 11.5–15.5)
WBC: 8.2 10*3/uL (ref 4.0–10.5)

## 2010-11-20 LAB — PHOSPHORUS: Phosphorus: 3.9 mg/dL (ref 2.3–4.6)

## 2010-11-20 LAB — BASIC METABOLIC PANEL
Calcium: 8.2 mg/dL — ABNORMAL LOW (ref 8.4–10.5)
GFR calc Af Amer: 44 mL/min — ABNORMAL LOW (ref >60)
GFR calc non Af Amer: 36 mL/min — ABNORMAL LOW (ref >60)
Glucose, Bld: 109 mg/dL — ABNORMAL HIGH (ref 70–99)
Sodium: 131 mEq/L — ABNORMAL LOW (ref 135–145)

## 2010-11-20 LAB — GLUCOSE, CAPILLARY
Glucose-Capillary: 128 mg/dL — ABNORMAL HIGH (ref 70–99)
Glucose-Capillary: 109 mg/dL — ABNORMAL HIGH (ref 70–99)
Glucose-Capillary: 131 mg/dL — ABNORMAL HIGH (ref 70–99)
Glucose-Capillary: 113 mg/dL — ABNORMAL HIGH (ref 70–99)

## 2010-11-20 LAB — MAGNESIUM: Magnesium: 1.4 mg/dL — ABNORMAL LOW (ref 1.5–2.5)

## 2010-11-20 LAB — HEPARIN LEVEL (UNFRACTIONATED): Heparin Unfractionated: 0.21 IU/mL — ABNORMAL LOW (ref 0.30–0.70)

## 2010-11-21 ENCOUNTER — Inpatient Hospital Stay (HOSPITAL_COMMUNITY): Payer: Medicare Other

## 2010-11-21 DIAGNOSIS — J9600 Acute respiratory failure, unspecified whether with hypoxia or hypercapnia: Secondary | ICD-10-CM

## 2010-11-21 DIAGNOSIS — E871 Hypo-osmolality and hyponatremia: Secondary | ICD-10-CM

## 2010-11-21 DIAGNOSIS — R222 Localized swelling, mass and lump, trunk: Secondary | ICD-10-CM

## 2010-11-21 DIAGNOSIS — E782 Mixed hyperlipidemia: Secondary | ICD-10-CM

## 2010-11-21 LAB — CBC
HCT: 23.3 % — ABNORMAL LOW (ref 36.0–46.0)
Hemoglobin: 7.9 g/dL — ABNORMAL LOW (ref 12.0–15.0)
MCH: 29.4 pg (ref 26.0–34.0)
MCHC: 33.9 g/dL (ref 30.0–36.0)

## 2010-11-21 LAB — BASIC METABOLIC PANEL
BUN: 31 mg/dL — ABNORMAL HIGH (ref 6–23)
CO2: 32 mEq/L (ref 19–32)
Chloride: 94 mEq/L — ABNORMAL LOW (ref 96–112)
Creatinine, Ser: 1.36 mg/dL — ABNORMAL HIGH (ref 0.4–1.2)

## 2010-11-21 LAB — BODY FLUID CULTURE: Gram Stain: NONE SEEN

## 2010-11-21 LAB — BLOOD GAS, ARTERIAL
Acid-Base Excess: 9.3 mmol/L — ABNORMAL HIGH (ref 0.0–2.0)
MECHVT: 360 mL
Patient temperature: 98.6
TCO2: 34.2 mmol/L (ref 0–100)
pH, Arterial: 7.508 — ABNORMAL HIGH (ref 7.350–7.400)

## 2010-11-21 LAB — HEMOGLOBIN AND HEMATOCRIT, BLOOD
HCT: 22.1 % — ABNORMAL LOW (ref 36.0–46.0)
Hemoglobin: 7.6 g/dL — ABNORMAL LOW (ref 12.0–15.0)

## 2010-11-21 LAB — GLUCOSE, CAPILLARY
Glucose-Capillary: 112 mg/dL — ABNORMAL HIGH (ref 70–99)
Glucose-Capillary: 95 mg/dL (ref 70–99)

## 2010-11-22 ENCOUNTER — Inpatient Hospital Stay (HOSPITAL_COMMUNITY): Payer: Medicare Other

## 2010-11-22 LAB — HEPARIN LEVEL (UNFRACTIONATED): Heparin Unfractionated: 0.17 IU/mL — ABNORMAL LOW (ref 0.30–0.70)

## 2010-11-22 LAB — CBC
Platelets: 142 10*3/uL — ABNORMAL LOW (ref 150–400)
RBC: 2.64 MIL/uL — ABNORMAL LOW (ref 3.87–5.11)
RDW: 17.3 % — ABNORMAL HIGH (ref 11.5–15.5)
WBC: 8.3 10*3/uL (ref 4.0–10.5)

## 2010-11-22 LAB — BASIC METABOLIC PANEL
BUN: 30 mg/dL — ABNORMAL HIGH (ref 6–23)
Chloride: 99 mEq/L (ref 96–112)
GFR calc Af Amer: 53 mL/min — ABNORMAL LOW (ref >60)
GFR calc non Af Amer: 44 mL/min — ABNORMAL LOW (ref >60)
Potassium: 3.8 mEq/L (ref 3.5–5.1)
Sodium: 138 mEq/L (ref 135–145)

## 2010-11-22 LAB — GLUCOSE, CAPILLARY
Glucose-Capillary: 112 mg/dL — ABNORMAL HIGH (ref 70–99)
Glucose-Capillary: 103 mg/dL — ABNORMAL HIGH (ref 70–99)
Glucose-Capillary: 106 mg/dL — ABNORMAL HIGH (ref 70–99)

## 2010-11-22 LAB — MAGNESIUM: Magnesium: 2 mg/dL (ref 1.5–2.5)

## 2010-11-22 LAB — PHOSPHORUS: Phosphorus: 3.7 mg/dL (ref 2.3–4.6)

## 2010-11-23 ENCOUNTER — Inpatient Hospital Stay (HOSPITAL_COMMUNITY): Payer: Medicare Other

## 2010-11-23 LAB — BASIC METABOLIC PANEL
CO2: 28 mEq/L (ref 19–32)
Calcium: 8.6 mg/dL (ref 8.4–10.5)
Chloride: 103 mEq/L (ref 96–112)
Creatinine, Ser: 1.29 mg/dL — ABNORMAL HIGH (ref 0.4–1.2)
GFR calc Af Amer: 50 mL/min — ABNORMAL LOW (ref >60)
Glucose, Bld: 83 mg/dL (ref 70–99)

## 2010-11-23 LAB — CBC
HCT: 23.9 % — ABNORMAL LOW (ref 36.0–46.0)
MCH: 29.6 pg (ref 26.0–34.0)
MCV: 89.5 fL (ref 78.0–100.0)
Platelets: 201 10*3/uL (ref 150–400)
RDW: 17.8 % — ABNORMAL HIGH (ref 11.5–15.5)

## 2010-11-24 ENCOUNTER — Inpatient Hospital Stay (HOSPITAL_COMMUNITY): Payer: Medicare Other

## 2010-11-24 DIAGNOSIS — C3410 Malignant neoplasm of upper lobe, unspecified bronchus or lung: Secondary | ICD-10-CM

## 2010-11-24 LAB — BASIC METABOLIC PANEL
BUN: 22 mg/dL (ref 6–23)
CO2: 21 mEq/L (ref 19–32)
Glucose, Bld: 83 mg/dL (ref 70–99)
Potassium: 3.7 mEq/L (ref 3.5–5.1)
Sodium: 139 mEq/L (ref 135–145)

## 2010-11-24 LAB — CBC
Hemoglobin: 8.3 g/dL — ABNORMAL LOW (ref 12.0–15.0)
MCH: 29.9 pg (ref 26.0–34.0)
MCHC: 32.8 g/dL (ref 30.0–36.0)
MCV: 91 fL (ref 78.0–100.0)
RBC: 2.78 MIL/uL — ABNORMAL LOW (ref 3.87–5.11)

## 2010-11-24 LAB — PROTIME-INR: Prothrombin Time: 16.4 seconds — ABNORMAL HIGH (ref 11.6–15.2)

## 2010-11-25 ENCOUNTER — Inpatient Hospital Stay (HOSPITAL_COMMUNITY): Payer: Medicare Other

## 2010-11-25 LAB — CBC
HCT: 33.2 % — ABNORMAL LOW (ref 36.0–46.0)
Hemoglobin: 10.5 g/dL — ABNORMAL LOW (ref 12.0–15.0)
MCH: 28.8 pg (ref 26.0–34.0)
MCV: 91.2 fL (ref 78.0–100.0)
RBC: 3.64 MIL/uL — ABNORMAL LOW (ref 3.87–5.11)
WBC: 10.7 10*3/uL — ABNORMAL HIGH (ref 4.0–10.5)

## 2010-11-25 LAB — BASIC METABOLIC PANEL
BUN: 17 mg/dL (ref 6–23)
BUN: 16 mg/dL (ref 6–23)
CO2: 18 mEq/L — ABNORMAL LOW (ref 19–32)
Chloride: 108 mEq/L (ref 96–112)
Chloride: 108 mEq/L (ref 96–112)
Creatinine, Ser: 1.2 mg/dL (ref 0.4–1.2)
Glucose, Bld: 91 mg/dL (ref 70–99)
Glucose, Bld: 86 mg/dL (ref 70–99)
Potassium: 3.3 mEq/L — ABNORMAL LOW (ref 3.5–5.1)
Potassium: 3.4 mEq/L — ABNORMAL LOW (ref 3.5–5.1)

## 2010-11-25 LAB — PROCALCITONIN: Procalcitonin: 0.39 ng/mL

## 2010-11-25 LAB — PROTIME-INR: Prothrombin Time: 21.6 seconds — ABNORMAL HIGH (ref 11.6–15.2)

## 2010-11-26 ENCOUNTER — Inpatient Hospital Stay (HOSPITAL_COMMUNITY): Payer: Medicare Other

## 2010-11-26 DIAGNOSIS — C3410 Malignant neoplasm of upper lobe, unspecified bronchus or lung: Secondary | ICD-10-CM

## 2010-11-26 LAB — CBC
HCT: 26.7 % — ABNORMAL LOW (ref 36.0–46.0)
Hemoglobin: 8.8 g/dL — ABNORMAL LOW (ref 12.0–15.0)
MCH: 30.1 pg (ref 26.0–34.0)
MCHC: 33 g/dL (ref 30.0–36.0)

## 2010-11-26 LAB — PROTIME-INR: INR: 2.72 — ABNORMAL HIGH (ref 0.00–1.49)

## 2010-11-27 LAB — PROTIME-INR
INR: 2.86 — ABNORMAL HIGH (ref 0.00–1.49)
Prothrombin Time: 30.1 seconds — ABNORMAL HIGH (ref 11.6–15.2)

## 2010-11-28 LAB — PROTIME-INR: Prothrombin Time: 23.8 seconds — ABNORMAL HIGH (ref 11.6–15.2)

## 2010-11-29 LAB — CBC
MCH: 29.6 pg (ref 26.0–34.0)
MCHC: 32.2 g/dL (ref 30.0–36.0)
Platelets: 238 10*3/uL (ref 150–400)
RBC: 2.97 MIL/uL — ABNORMAL LOW (ref 3.87–5.11)
RDW: 19.8 % — ABNORMAL HIGH (ref 11.5–15.5)

## 2010-11-29 LAB — PROTIME-INR: Prothrombin Time: 28.4 seconds — ABNORMAL HIGH (ref 11.6–15.2)

## 2010-11-29 NOTE — Consult Note (Signed)
Madison [name], Madison [name]                 ACCOUNT NO.:  [id number]  MEDICAL RECORD NO.:  [id number]           PATIENT TYPE:  I  LOCATION:  2103                         FACILITY:  [facility]  PHYSICIAN:  Samul Dada, M.D.DATE OF BIRTH:  11-09-1945  DATE OF CONSULTATION:  11/18/2010 DATE OF DISCHARGE:                                CONSULTATION   REASON FOR CONSULTATION:  Lung cancer.  CONSULTING PHYSICIAN:  Samul Dada, MD.  PRIMARY PHYSICIAN:  Dr. Blain Pais.  HISTORY OF PRESENT ILLNESS:  Madison [name] is a 65 year old African American female with a history of tobacco abuse, admitted on November 09, 2010, with a new onset of atrial fibrillation and rapid ventricular rate.  Her chest x-ray on admission had demonstrated the right hilar mass with right hilar adenopathy in combination with pneumonia changes. The patient had significant cardiac complications, for which she was transferred to the ICU.  Once stabilized, a CT of the chest without contrast and CT angio of the chest performed on November 10, 2010, and November 11, 2010, respectively, revealed postobstructive process involving the medial right upper lobe, with probable right hilar adenopathy - mass with enlarged AP window and tracheal lymph nodes with small bilateral pleural effusion which were scant on November 17, 2010, or larger.  No obvious disease on noncontrast CT scan, including the brain CT done today.  Based on these findings, we were asked to see the patient in consultation, after a bronchoscopy performed on November 11, 2010, revealed a white shiny endobronchial lesion in the left anterior subsegment of the left upper lobe, from which brushings were not taken, and cytology was positive for squamous cell carcinoma.  Biopsy of the right upper lobe of the bronchial admission also was positive for squamous cell carcinoma (case number [id number], Dr. Priscille Kluver).  Of note, on November 17, 2010, she had undergone right thoracentesis as  well, with removal of 950 mL of yellow nonturbid fluid, with improvement of the respiratory status, both with the findings of LDH in fluid, being at the level of 61.  The patient is sedated and intubated at this time, and we are to see her, with recommendations regarding her care.  PAST MEDICAL HISTORY: 1. Hypertension. 2. Anemia requiring transfusions during this admission for a     hemoglobin of 6.4. 3. History of neuropathy for the last 3 years, worse during the last 6     months. 4. Tobacco abuse. 5. New onset of atrial fibrillation - rapid ventricular rate during     this admission. 6. History of alcohol intake. 7. Right upper lobe pneumonia during this admission, complicated with     ARD and septic shock. 8. Ejection fraction 45% with mild MR per 2D echo. 9. Acute renal failure during this admission. 10.Thrombocytopenia during this admission.  SURGERIES:  None prior.  During this admission, the patient underwent 2 procedures, including bronchoscopy by Dr. Ortencia Kick on November 11, 2010, and right thoracentesis on November 17, 2010.  ALLERGIES:  LISINOPRIL, causes angioedema.  MEDICATIONS:  Peridex, Decadron, folic acid, Lasix, NovoLog, Zaroxolyn, Nepro, Protonix, Feldene, KCl, ProSorb, B1, fentanyl, phenylephrine.  REVIEW OF  SYSTEMS:  Unable to obtain, due to the patient's status, she is sedated and intubated.  Per chart report, it appears that she had unintentional weight loss over the last 5 months, accompanied by fatigue, chronic watery diarrhea, and urine incontinence.  Unable to obtain the rest of the review of systems.  FAMILY HISTORY:  Mother died at 61 with kidney disease.  Father died at 67 with MI.  He had a history of Alzheimer's.  He has two siblings who died with MI.  SOCIAL HISTORY:  The patient is married.  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 On health maintenance, the patient smokes one pack a day of tobacco for at least 40 years, and per family report.  According to  the chart, she drinks one gallon of brandy a week.  PHYSICAL EXAM:  GENERAL:  This is a seen 65 year old black female, ill- appearing, sedated, opens eyes upon sound, unable to interact.  She is intubated. VITAL SIGNS:  Blood pressure 100/62, pulse 100, respirations 20. HEENT:  Remarkable for multiple areas of presence of tubes, including nasogastric and ventilation.  PERRLA.  Unable to visualize the oral cavity. NECK:  Supple.  No cervical or supraclavicular masses.  The exam is limited due to the presence of other line placement. LUNGS:  With coarse breath sounds anteriorly, including rhonchi and anterior rales.  No axillary masses. CARDIOVASCULAR:  Regular rate and rhythm with a 2/6 systolic murmur.  No rubs or gallops. ABDOMEN:  No apparent tenderness.  Bowel sounds x4.  No apparent hepatosplenomegaly. GU and RECTAL:  Deferred. EXTREMITIES:  With no clubbing or cyanosis.  There is 1-2+ edema bilaterally, per edema as well.  Upper extremity edema as well.  No inguinal masses. SKIN:  Without lesions or bruising.  No apparent petechial rash.  This is the presence of bilateral TIS calf. BREASTS:  Not examined. NEURO:  Not able to be performed.  LABS:  Hemoglobin 8.9, hematocrit 24.7, white count 7.8, platelets 97. MCV 83.7, ANC 3.8 for a white count of 6.9 with lymphocytes 2.1, monocytes 1.0, iron 35, TIBC 114, percent saturation 21, ferritin 84, folic acid 12.1, B12 1338, hapto 93, D-dimer 3.17, and fibrinogen 429. PTT 41, PT 16.2, INR 1.28.  Sodium 127, potassium 5.6, BUN 21, creatinine 1.51, glucose 116, total bilirubin 0.6, AST 90, and alkaline phosphatase 56, ALT 13, total protein 4.1, albumin 1.2, calcium 8.5, LDH 209 with a pH in fluid of 61, TSH 1.612.  Cultures negative.  Hemoccult negative on November 09, 2010.  Serum osmolality 252.  Urine osmolality 295. C difficile negative.  ASSESSMENT/PLAN:  Dr. Arline Asp had seen and evaluated the patient and reviewed the chart.   This is a 65 year old African American female, currently in the ICU on ventilator, with 40% oxygen, responding to pain, but apparently not able to communicate, and not following commands.  The bronchoscopy on November 11, 2010, showed an obstructing endobronchial lesion in the anterior segment of the right upper lobe with brushings and washings showing squamous cell carcinoma in the setting of he history of tobacco abuse.  Right pleural fluid cytology is negative. From the CT scan of the chest done without IVC on November 10, 2010, the patient appears to have a postobstructive process involving the medial right upper lobe with probable right hilar adenopathy - mass with enlarged AP window and pretracheal lymph nodes, as well as small bilateral pleural effusions which scans on November 17, 2010, showed them to be larger.  No obvious distant disease on noncontrast CT scan,  including the brain scan done today.  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  In addition, it does not explain that delay toward dependency and current mental status.  Dr. Arline Asp has no diagnostic or therapeutic recommendations to make at this time.  We see no indication of benefit or benefit from radiation or chemotherapy at this moment.  If the patient's medical status does improve, she will be a candidate for a more complete, accurate staging, and combined chemo- radiation.  Dr. Arline Asp will plan to discuss her condition with the patient's family, but they are not present at this time.     Marlowe Kays, P.A.   ______________________________ Samul Dada, M.D.    SW/MEDQ  D:  11/19/2010  T:  11/20/2010  Job:  [id number]  Electronically Signed by Marlowe Kays P.A. on 11/22/2010 08:35:23 AM Electronically Signed by Kimberlee Nearing M.D. on 11/29/2010 03:17:04 PM

## 2010-12-01 NOTE — Discharge Summary (Addendum)
Madison [name], Madison [name]                 ACCOUNT NO.:  [id number]  MEDICAL RECORD NO.:  [id number]           PATIENT TYPE:  I  LOCATION:  2021                         FACILITY:  [facility]  PHYSICIAN:  Oley Balm. Sung Amabile, MD   DATE OF BIRTH:  1946/05/23  DATE OF ADMISSION:  11/09/2010 DATE OF DISCHARGE:  11/26/2010                              DISCHARGE SUMMARY   DISCHARGE DIAGNOSES: 1. Status post shock secondary to diarrhea, pneumonia/effusions/acute     respiratory distress syndrome. 2. Severe metabolic acidosis. 3. Atrial fibrillation and systolic congestive heart failure. 4. Anemia. 5. Acute renal failure secondary to shock. 6. Malnutrition. 7. Debilitation. 8. Thrombocytopenia.  LINES/TUBES: 1. November 11, 2010 to November 24, 2010 a left IJ triple-lumen catheter. 2. November 11, 2010 to November 21, 2010 oral endotracheal tube. 3. November 12, 2010 November 17, 2010 is a left radial A-line.  MICRO DATA: 1. On November 09, 2010, stool culture is negative.  On November 11, 2010 BAL     demonstrates normal oropharyngeal flora. 2. November 11, 2010, blood cultures x2 demonstrate no growth. 3. On November 17, 2010, pleural fluid demonstrates no growth.  ANTIBIOTIC DATA: 1. On November 10, 2010 to November 13, 2010, the patient was placed on     ceftriaxone. 2. On November 13, 2010 to November 15, 2010 azithromycin. 3. On November 13, 2010 to November 15, 2010 vancomycin. 4. On November 13, 2010 to November 18, 2010 Zosyn.  BEST PRACTICE:  The patient was placed on Protonix for stress ulcer prophylaxis and Lovenox for DVT prevention in the setting of new onset atrial fibrillation.  NUTRITION:  Initially, the patient was supported nutritionally with tube feeding during intensive care stay and was transitioned to an oral diet with advancement per Speech Therapy secondary to dysphagia postextubation.  PROTOCOLS/CONSULTANTS: 1. ICU hyperglycemia protocol. 2. Sedation protocol. 3. Samul Dada, MD  KEY EVENTS/STUDIES: 1. CT of the  chest on November 10, 2010 demonstrates dense medial right     upper lobe pulmonary parenchymal consolidation with small bilateral     effusions. 2. On November 11, 2010, echo demonstrates an EF of 45% with mild mitral     regurgitation.  On November 11, 2010, a bronchoscopy demonstrates a     white shiny endobronchial lesions seen in the left anterior     subsegment of the left upper lobe.  Bronchial brushings from November 11, 2010 demonstrates squamous cell carcinoma. 3. On November 16, 2010, CT of abdomen and pelvis demonstrates anasarca     and ascites.  Noted bilateral pleural effusions. 4. On November 17, 2010, thoracentesis was performed with 1 liter of     fluid removed from the right. 5. On November 18, 2010, ultrasound of the chest demonstrates moderate     pleural effusion on the left, but is not able drain. 6. On November 18, 2010, the patient required a diltiazem drip and     heparin drip for new onset atrial fibrillation.  CT of the head     without contrast demonstrates no acute abnormality.  On November 21, 2010, the patient was extubated.  HISTORY OF PRESENT ILLNESS:  Madison [name] is a 65 year old African American female smoker with a past medical history of hypertension, anemia and alcohol use who presented to her primary care provider with complaints of watery diarrhea and unintentional weight loss.  Workup demonstrated significant hypokalemia with a potassium of 2.0, and she was sent to the emergency department and admitted by [facility] with chronic diarrhea, atrial fibrillation and hypokalemia.  On November 11, 2010, she underwent a V/Q scan and had a severe panic attack with significant hypoxia and required intubation and mechanical ventilation. At that time, she was noted have severe metabolic acidosis and on 2-D echo demonstrated an EF of 45% with mild mitral regurgitation.  She also underwent bronchoscopy at the time of intubation secondary to concerns for pneumonia and was noted  to have an endobronchial lesion consistent with squamous cell carcinoma on biopsy and brushings.  She also underwent a thoracentesis on November 17, 2010, with 1 liter of fluid removed with improvement in respiratory status.  Pleural fluid was negative.  She subsequently was noted to have new onset atrial fibrillation.  During her ICU stay, she was noted to have new onset atrial fibrillation and was treated with a diltiazem drip as well as IV heparin.  She underwent a CT of the head secondary to being slow to wakened with no acute abnormality, and she subsequently was extubated on November 21, 2010.  Postextubation,  Madison [name] was noted to be severely debilitated from her already poor baseline on admission.  Dr. Arline Asp was consulted during hospital course secondary to squamous cell carcinoma findings on bronchoscopy.  At time of the evaluation, Madison [name] was intubated.  He was felt by Hem/Onc at that time that if her medical status improved, she would be a candidate for more complete accurate staging and chemo radiation.  At the time of discharge, Madison [name] plans to discharge to skilled nursing facility for acute short- term rehab.  Ms. Malicoat will need to revisit further Oncology plan with Dr. Arline Asp post skilled nursing facility discharge.  It is anticipated that she should require more than 2 weeks of intensive rehab. Unfortunately during hospital course, Ms. Stifter did have acute renal failure secondary to shock.  This did resolve during hospital course and at the time of discharge serum creatinine has returned to 1.25.  Speech Therapy, Occupational Therapy and Physical Therapy were consulted during the hospital course.  Also noted Ms. Biggs was treated for her initial pneumonia and has completed IV antibiotic therapy for such.  HOSPITAL COURSE BY DISCHARGE DIAGNOSES: 1. Status post shock secondary to diarrhea, pneumonia/effusions/acute     respiratory distress syndrome.  As per  HPI, Ms. Vuncannon was admitted     to [facility] on November 09, 2010 by [facility] secondary to     watery diarrhea and pneumonia.  She did undergo a V/Q scan on     admission to rule out pulmonary embolism.  She did have a severe     panic attacks during testing with associated hypoxia, requiring     intubation and mechanical ventilation.  At the time of intubation,     she was noted to be profoundly acidotic.  She was intubated and     maintained on mechanical ventilation as per dates above.  She     underwent a bronchoscopy after intubation, which demonstrated     findings consistent with squamous cell carcinoma..  Stool culture  was negative during the hospital course.  She was given Imodium     with reduction in watery diarrhea.  She was treated with IV     vancomycin and Zosyn for pneumonia and has completed IV antibiotics     for such.  She also underwent a thoracentesis for effusion, which     was negative for malignancy. 2. Severe metabolic acidosis in the setting of chronic diarrhea,     pneumonia and acute shock with correction of electrolyte     abnormalities and underlying pneumonia/shock, metabolic acidosis     did resolve and her renal function did return to normal. 3. New-onset atrial fibrillation/systolic congestive heart failure.     Unfortunately, Ms. Spengler did have new onset atrial fibrillation     during hospital course.  She was placed on IV heparin and IV     diltiazem initially with transition to by mouth Cardizem and     Coumadin.  At the time of dictation, Ms. Pickerel's INR has reached     2.72.  Bridge Lovenox may be stopped at this time.  It is     recommended that with change in INR from 1.86-2.72 that she     received 2.5 mg of Coumadin this evening on November 26, 2010.  Day     prior on November 25, 2010, she did receive 5 mg of Coumadin.  It is     also recommended that she have a followup PT/INR on November 27, 2010,     for further adjustment of Coumadin  dosing. 4. Anemia.  This is the setting of chronic disease.  She did require     transfusion during hospital course secondary to anemia.  At this     time, there is no evidence of acute bleeding and hemoglobin and     hematocrit are stable. 5. Acute renal failure secondary to shock.  As per HPI, Ms. Furnari     unfortunately did have subsequent renal failure in the setting of     shock.  This was monitored serially during hospital course and     currently her renal function has returned to near baseline. 6. Malnutrition.  Ms. Mordan reportedly drinks approximately one     gallon of brandy per week.  Her baseline admitting nutritional     status was poor at best with an albumin of 1.7.  During hospital     course, Nutrition was consulted for dietary recommendations.     Nutrition recommends at this time that she have snacks added     between meals for protein intake.  She also has supplemental Ensure     and  ProSource.  No carb during the day.  The patient herself     indicates that she has had minimal appetite and was afraid to eat     secondary to diarrhea.  At this time, her p.o. intake has improved     with approximately 75-100%.  It is still recommended that she     continue with a dysphagia III diet with thin liquids. 7. Debilitation secondary to baseline poor nutritional status and     acute critical illness.  Ms. Oplinger unfortunately is debilitated at     time of dictation.  She does need intensive physical therapy for     strengthening as well as adequate nutritional intake.  At this     time, it is recommended that she discharged to a skilled nursing     facility  for rehab needs prior to oncology treatment. 8. Thrombocytopenia in the setting of shock and has resolved. 9. 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  At this time, it is recommended that she     completed approximately at least 2 weeks of intensive rehab therapy      prior to pursuing oncology treatment.  DISCHARGE INSTRUCTIONS: 1. Diet:  Dysphagia III with thin liquids heart-healthy diet. 2. Activity:  She has been instructed to increase activity slowly and     as tolerated. 3. Followup:  Ms. Hillenburg is followed for primary care needs by Dr.     Blain Pais and Dr. Arline Asp for oncology need.  She will need to     follow up with Dr. Arline Asp and Dr. Blain Pais at time of discharge     from skilled nursing facility.  ANTICOAGULATION:  Ms. Geidel currently is scheduled to receive 2.5 mg of Coumadin on November 26, 2010.  The day prior her INR was 1.86.  On the day prior of November 25, 2010, she received 5 mg in the evening and her INR increased to 2.72.  She also was on a Lovenox bridge at that time. Today November 26, 2010, her Lovenox will be discontinued, and she will receive 2.5 mg dosing as above.  It is recommended she have a followup PT/INR in the a.m. of November 27, 2010, for further recommendations and adjustment of Coumadin.  DISCHARGE MEDICATIONS: 1. Coumadin.  She has been dispensed 5 mg tablets with a dose of 2.5     mg in the evening of November 26, 2010, by mouth. 2. Cardizem 120 mg ER by mouth daily. 3. Ensure 237 mL by mouth 3 times daily with meals. 4. Guaifenesin 100 mg per 5 mL by mouth every 6 hours as needed. 5. Combivent 2 puffs inhaled every 6 hours. 6. Imodium 2 mg by mouth every 8 hours as needed. 7. Multivitamin by mouth daily. 8. Folic acid 1 mg 2 tablets by mouth daily. 9. Loratadine 10 mg tabs 1 tablet by mouth daily as needed for allergy     symptoms. 10.Vitamin B 100 mg 1/2 tablet by mouth daily. 11.Vitamin D3 one tablet by mouth daily.  Ms. Putnam has been instructed to stop taking the following medications that were her previous home medications: 1. Losartan 50 mg by mouth daily. 2. Amlodipine 10 mg by mouth daily.  DISPOSITION:  At the time of discharge, Ms. Schwering has met maximum benefit of inpatient therapy and is  currently medically stable and cleared for discharge to skilled nursing facility of her choice.  Time spent on disposition greater than 45 minutes.     Canary Brim, NP   ______________________________ Oley Balm Sung Amabile, MD    BO/MEDQ  D:  11/26/2010  T:  11/26/2010  Job:  [id number]  cc:   Samul Dada, M.D. Dr. Blain Pais  Electronically Signed by Billy Fischer MD on 12/01/2010 12:59:54 AM Electronically Signed by Canary Brim  on 12/10/2010 02:21:36 PM

## 2010-12-07 NOTE — Consult Note (Signed)
[name], Madison                 ACCOUNT NO.:  [id number]  MEDICAL RECORD NO.:  [id number]           PATIENT TYPE:  I  LOCATION:  3311                         FACILITY:  [facility]  PHYSICIAN:  Bevelyn Buckles. Bensimhon, MDDATE OF BIRTH:  26-Jul-1946  DATE OF CONSULTATION:  11/09/2010 DATE OF DISCHARGE:                                CONSULTATION   PRIMARY CARE PHYSICIAN:  Dr[facility] Halford [facility]  PRIMARY CARDIOLOGIST:  None.  CHIEF COMPLAINT:  AFib/RVR.  HISTORY OF PRESENT ILLNESS:  Madison [name] is a 65 year old female with no previous history of arrhythmia.  She reports watery diarrhea 4-5 episodes per day with an approximately 20-pound weight loss over the last several months.  According to family, her nutrition is also poor. She has ongoing tobacco use and poor ambulation chronically secondary to neuropathy for which she uses a walker.  The patient says she quit drinking several months ago, but in a private conversation, a family member stated that she is continuing to drink.  Yesterday, her family including a sister and niece found that these symptoms had been going on.  They made her see her primary care yesterday and labs revealed potassium of less than 2.0.  The patient was contacted and came to the emergency room for potassium supplementation.  Madison [name] was in AFib/ RVR at times and so Cardiology was asked to evaluate her.  She has been admitted by Primary Care for hypokalemia and dehydration among other issues.  Madison [name] states that because of the neuropathy she chronically holds onto things and uses a walker.  She is chronically weak.  She denies dyspnea on exertion.  She denies palpitations or chest pain.  She has no awareness that her heart rate is rapid or irregular at this time and does not know when it started.  She has no history of presyncope or syncope.  Currently, in the emergency room on O2 and receiving IV fluids as well as potassium  supplementation, she is resting comfortably.  PAST MEDICAL HISTORY: 1. Hypertension. 2. History of mitral valve prolapse, although she has never had an     echo. 3. Neuropathy.  SURGICAL HISTORY:  None.  ALLERGIES:  LISINOPRIL, which causes angioedema.  MEDICATIONS PRIOR TO ADMISSION: 1. Amlodipine 10 mg a day. 2. Cozaar 100 mg a day. 3. Losartan 10 mg a day. 4. Folic acid 2 mg a day. 5. Multivitamin daily. 6. Vitamin B1 1000 units a day.  SOCIAL HISTORY:  She lives in [place] with her husband.  She is a retired [employer] officer.  She has a greater than 40-pack-year history of ongoing tobacco use.  She has a history of EtOH abuse, drinking half a gallon of brandy a week and according to the family member this is still going on although the patient denies it.  She denies drug abuse.  She chronically uses a walker for the last 5-6 months because of neuropathy.  FAMILY HISTORY:  Her mother died at 69 of kidney disease and stroke. Her father died at 56 with an MI and possible history of atrial fibrillation, he also had Alzheimer's.  She has 2 siblings that died of MIs, one at 83 and one at 35.  REVIEW OF SYSTEMS:  Her appetite and nutritional status is poor.  She sleeps a lot.  Her weight is down.  She wears glasses.  She has a dry cough that has been going on for several weeks.  She takes a nap for 3-4 hours every day which she has been doing for approximately the last 5 years.  She has problems with incontinence, but he wears a pad to keep from getting up at night.  She was diagnosed with neuropathy about 3 years ago.  The diarrhea is watery and nonbloody.  Full 14-point review of systems is otherwise negative except as stated in the HPI.  PHYSICAL EXAMINATION:  VITAL SIGNS:  Temperature is 98.0, blood pressure 95/54, pulse 94, respiratory rate 19, and O2 saturation 100% on room air. GENERAL:  She is a well-developed, frail African American female in no acute  distress at rest. HEENT:  Normal. NECK:  There is no lymphadenopathy, thyromegaly, or bruit noted.  She has mild JVD at approximately 10 cm. CARDIOVASCULAR:  Her heart is irregular in rate and rhythm and rapid at times with an S1 and S2 and no significant murmur, rub, or gallop is noted.  Distal pulses are intact in all 4 extremities. LUNGS:  She has a few rales noted. SKIN:  No rashes or lesions are noted. ABDOMEN:  Soft and slightly diffusely tender with active bowel sounds. EXTREMITIES:  There is no cyanosis, clubbing, or edema noted. MUSCULOSKELETAL:  There is no joint deformity or effusions and no spine or CVA tenderness. NEURO:  She is alert and oriented.  Cranial nerves II-XII grossly intact.  Chest x-ray shows a right hilar mass-like enlargement with wedge-shaped airspace disease extending anteriorly from the right hilum and the right upper lobe follow minor fissure.  There is good reflect with a combination of acute pneumonia with right hilar lymphadenopathy or bronchogenic carcinoma.  Abdominal x-ray shows a bowel gas pattern within normal limits.  EKG is AFib, rate 93, with no acute ischemic changes.  LABORATORY VALUES:  Hemoglobin 8.5, hematocrit 26.2, WBC 6.9, and platelets 250.  Sodium 138, potassium less than 2.0, chloride 106, CO2 of 19, BUN 28, creatinine 2.1, and glucose 89.  Other CMET values within normal limits except for albumin low at 2.4 and calcium at 7.6.  Fecal occult blood negative.  IMPRESSION:  Madison [name] was seen today by Dr. Gala Romney, the patient evaluated and the data reviewed.  She is a 65 year old female with a history of ethyl alcohol abuse, hypertension, and progressive weight loss.  She is admitted with a potassium of 2.0 and possible lung mass on chest x-ray.  There is an incidental finding of AFib.  She denies chest pain, shortness of breath, or palpitations.  Her heart rate is now 95- 115.  We recommend rate control with low-dose calcium  channel blockers such as Cardizem 30 mg q.6 h. as needed or as tolerated.  Her current heart rate is probably fine given her current illness level.  We agree with hydration and checking thyroid function tests.  We will make sure an echo was done as well and follow up on these results.  Because of multiple medical problems, she is not currently a Coumadin candidate.  We will continue to follow her closely with you.     Theodore Demark, PA-C   ______________________________ Bevelyn Buckles. Bensimhon, MD    RB/MEDQ  D:  11/09/2010  T:  11/10/2010  Job:  [id number]  Electronically Signed by Theodore Demark PA-C on 11/10/2010 01:48:40 PM Electronically Signed by Arvilla Meres MD on 12/07/2010 07:18:06 PM

## 2010-12-07 NOTE — Discharge Summary (Signed)
  NAMECANDIS, [name]                 ACCOUNT NO.:  [id number]  MEDICAL RECORD NO.:  [id number]           PATIENT TYPE:  I  LOCATION:  2021                         FACILITY:  [facility]  PHYSICIAN:  Charlcie Cradle. Delford Field, MD, FCCPDATE OF BIRTH:  08/15/1945  DATE OF ADMISSION:  11/09/2010 DATE OF DISCHARGE:                              DISCHARGE SUMMARY   ADDENDUM  This is addendum to a discharge summary performed on November 26, 2010, done for Dr. Shan Levans.  No significant change from previous discharge summary.  Just note that her Coumadin has been changed from 5 mg a day to 4 mg daily and that her hemoglobin is now 8.8, hematocrit 27.3, platelets 238, WBC 6.9.  Her INR on November 28, 2010 was 2.11 and is pending for today's date.  She is scheduled to be transferred to a skilled nursing facility on today's date.     Devra Dopp, MSN, ACNP   ______________________________ Charlcie Cradle. Delford Field, MD, FCCP    SM/MEDQ  D:  11/29/2010  T:  11/29/2010  Job:  [id number]  Electronically Signed by Devra Dopp MSN ACNP on 12/06/2010 04:43:37 PM Electronically Signed by Shan Levans MD FCCP on 12/07/2010 01:19:15 PM

## 2010-12-09 ENCOUNTER — Other Ambulatory Visit: Payer: Self-pay | Admitting: Internal Medicine

## 2010-12-09 ENCOUNTER — Ambulatory Visit (HOSPITAL_COMMUNITY): Payer: Medicare Other | Attending: Internal Medicine

## 2010-12-09 DIAGNOSIS — D649 Anemia, unspecified: Secondary | ICD-10-CM | POA: Insufficient documentation

## 2010-12-10 LAB — CROSSMATCH
ABO/RH(D): O POS
Unit division: 0

## 2010-12-10 NOTE — Discharge Summary (Addendum)
  NAMEPRACHI, [name]                 ACCOUNT NO.:  [id number]  MEDICAL RECORD NO.:  [id number]           PATIENT TYPE:  I  LOCATION:  2021                         FACILITY:  [facility]  PHYSICIAN:  Oley Balm. Sung Amabile, MD   DATE OF BIRTH:  11/16/1945  DATE OF ADMISSION:  11/09/2010 DATE OF DISCHARGE:                              DISCHARGE SUMMARY   ADDENDUM  Madison [name] has been scheduled for followup with Dr. Arline Asp on Dec 27, 2010, at 10 a.m.  [facility] number is [phone] and if there are any appointment problems, the direct line for scheduling is [phone].  Please call if any difficulties with keeping up appointment.     Canary Brim, NP   ______________________________ Oley Balm Sung Amabile, MD    BO/MEDQ  D:  11/26/2010  T:  11/27/2010  Job:  [id number]  cc:   Samul Dada, M.D. [facility]  Electronically Signed by Canary Brim  on 12/10/2010 02:21:38 PM Electronically Signed by Billy Fischer MD on 12/13/2010 04:23:40 AM

## 2010-12-27 ENCOUNTER — Encounter: Payer: Medicare Other | Admitting: Oncology

## 2010-12-27 ENCOUNTER — Other Ambulatory Visit (HOSPITAL_COMMUNITY): Payer: Self-pay | Admitting: Oncology

## 2010-12-27 ENCOUNTER — Encounter (HOSPITAL_BASED_OUTPATIENT_CLINIC_OR_DEPARTMENT_OTHER): Payer: Medicare Other | Admitting: Oncology

## 2010-12-27 DIAGNOSIS — C3490 Malignant neoplasm of unspecified part of unspecified bronchus or lung: Secondary | ICD-10-CM

## 2010-12-27 LAB — COMPREHENSIVE METABOLIC PANEL
ALT: 14 U/L (ref 0–35)
AST: 18 U/L (ref 0–37)
Albumin: 3.5 g/dL (ref 3.5–5.2)
BUN: 14 mg/dL (ref 6–23)
CO2: 20 mEq/L (ref 19–32)
Calcium: 8.9 mg/dL (ref 8.4–10.5)
Chloride: 109 mEq/L (ref 96–112)
Creatinine, Ser: 1.26 mg/dL — ABNORMAL HIGH (ref 0.40–1.20)
Potassium: 3.9 mEq/L (ref 3.5–5.3)

## 2010-12-27 LAB — CEA: CEA: 0.9 ng/mL (ref 0.0–5.0)

## 2010-12-27 LAB — CBC WITH DIFFERENTIAL/PLATELET
Eosinophils Absolute: 0.1 10*3/uL (ref 0.0–0.5)
HCT: 30.7 % — ABNORMAL LOW (ref 34.8–46.6)
HGB: 10.3 g/dL — ABNORMAL LOW (ref 11.6–15.9)
LYMPH%: 44.7 % (ref 14.0–49.7)
MONO#: 0.7 10*3/uL (ref 0.1–0.9)
NEUT#: 1.6 10*3/uL (ref 1.5–6.5)
NEUT%: 37.2 % — ABNORMAL LOW (ref 38.4–76.8)
Platelets: 240 10*3/uL (ref 145–400)
WBC: 4.4 10*3/uL (ref 3.9–10.3)
lymph#: 2 10*3/uL (ref 0.9–3.3)

## 2010-12-30 ENCOUNTER — Encounter (HOSPITAL_COMMUNITY)
Admission: RE | Admit: 2010-12-30 | Discharge: 2010-12-30 | Disposition: A | Discharge status: Home / Self Care | Payer: Medicare Other | Source: Ambulatory Visit | Attending: Oncology | Admitting: Oncology

## 2010-12-30 ENCOUNTER — Other Ambulatory Visit (HOSPITAL_COMMUNITY): Payer: Self-pay | Admitting: Oncology

## 2010-12-30 DIAGNOSIS — C3490 Malignant neoplasm of unspecified part of unspecified bronchus or lung: Secondary | ICD-10-CM | POA: Insufficient documentation

## 2010-12-30 DIAGNOSIS — R948 Abnormal results of function studies of other organs and systems: Secondary | ICD-10-CM | POA: Insufficient documentation

## 2010-12-30 MED ORDER — FLUDEOXYGLUCOSE F - 18 (FDG) INJECTION
17.1000 | Freq: Once | INTRAVENOUS | Status: AC | PRN
  Administered 2010-12-30: 17.1 via INTRAVENOUS

## 2010-12-31 ENCOUNTER — Ambulatory Visit (HOSPITAL_COMMUNITY)
Admission: RE | Admit: 2010-12-31 | Discharge: 2010-12-31 | Disposition: A | Discharge status: Home / Self Care | Payer: Medicare Other | Source: Ambulatory Visit | Attending: Oncology | Admitting: Oncology

## 2010-12-31 ENCOUNTER — Other Ambulatory Visit (HOSPITAL_COMMUNITY): Payer: Self-pay | Admitting: Oncology

## 2010-12-31 DIAGNOSIS — R937 Abnormal findings on diagnostic imaging of other parts of musculoskeletal system: Secondary | ICD-10-CM | POA: Insufficient documentation

## 2010-12-31 DIAGNOSIS — G319 Degenerative disease of nervous system, unspecified: Secondary | ICD-10-CM | POA: Insufficient documentation

## 2010-12-31 DIAGNOSIS — R5381 Other malaise: Secondary | ICD-10-CM | POA: Insufficient documentation

## 2010-12-31 DIAGNOSIS — Z8673 Personal history of transient ischemic attack (TIA), and cerebral infarction without residual deficits: Secondary | ICD-10-CM | POA: Insufficient documentation

## 2010-12-31 DIAGNOSIS — C3490 Malignant neoplasm of unspecified part of unspecified bronchus or lung: Secondary | ICD-10-CM | POA: Insufficient documentation

## 2010-12-31 DIAGNOSIS — R5383 Other fatigue: Secondary | ICD-10-CM | POA: Insufficient documentation

## 2011-01-04 ENCOUNTER — Encounter (HOSPITAL_BASED_OUTPATIENT_CLINIC_OR_DEPARTMENT_OTHER): Payer: Medicare Other | Admitting: Oncology

## 2011-01-04 DIAGNOSIS — C3410 Malignant neoplasm of upper lobe, unspecified bronchus or lung: Secondary | ICD-10-CM

## 2011-01-04 DIAGNOSIS — J90 Pleural effusion, not elsewhere classified: Secondary | ICD-10-CM

## 2011-01-06 ENCOUNTER — Other Ambulatory Visit (HOSPITAL_COMMUNITY): Payer: Self-pay | Admitting: Oncology

## 2011-01-06 DIAGNOSIS — C3490 Malignant neoplasm of unspecified part of unspecified bronchus or lung: Secondary | ICD-10-CM

## 2011-01-10 ENCOUNTER — Ambulatory Visit
Admission: RE | Admit: 2011-01-10 | Discharge: 2011-01-10 | Disposition: A | Discharge status: Home / Self Care | Payer: Medicare Other | Source: Ambulatory Visit | Attending: Radiation Oncology | Admitting: Radiation Oncology

## 2011-01-10 DIAGNOSIS — Z79899 Other long term (current) drug therapy: Secondary | ICD-10-CM | POA: Insufficient documentation

## 2011-01-10 DIAGNOSIS — Z7901 Long term (current) use of anticoagulants: Secondary | ICD-10-CM | POA: Insufficient documentation

## 2011-01-10 DIAGNOSIS — I059 Rheumatic mitral valve disease, unspecified: Secondary | ICD-10-CM | POA: Insufficient documentation

## 2011-01-10 DIAGNOSIS — G609 Hereditary and idiopathic neuropathy, unspecified: Secondary | ICD-10-CM | POA: Insufficient documentation

## 2011-01-10 DIAGNOSIS — C3410 Malignant neoplasm of upper lobe, unspecified bronchus or lung: Secondary | ICD-10-CM | POA: Insufficient documentation

## 2011-01-10 DIAGNOSIS — Z510 Encounter for antineoplastic radiation therapy: Secondary | ICD-10-CM | POA: Insufficient documentation

## 2011-01-10 DIAGNOSIS — I10 Essential (primary) hypertension: Secondary | ICD-10-CM | POA: Insufficient documentation

## 2011-01-13 ENCOUNTER — Encounter (HOSPITAL_BASED_OUTPATIENT_CLINIC_OR_DEPARTMENT_OTHER): Payer: Medicare Other | Admitting: Oncology

## 2011-01-13 ENCOUNTER — Other Ambulatory Visit (HOSPITAL_COMMUNITY): Payer: Self-pay | Admitting: Oncology

## 2011-01-13 ENCOUNTER — Other Ambulatory Visit (HOSPITAL_COMMUNITY): Payer: Medicare Other

## 2011-01-13 DIAGNOSIS — C3410 Malignant neoplasm of upper lobe, unspecified bronchus or lung: Secondary | ICD-10-CM

## 2011-01-13 DIAGNOSIS — C3490 Malignant neoplasm of unspecified part of unspecified bronchus or lung: Secondary | ICD-10-CM

## 2011-01-13 DIAGNOSIS — J90 Pleural effusion, not elsewhere classified: Secondary | ICD-10-CM

## 2011-01-13 LAB — COMPREHENSIVE METABOLIC PANEL
AST: 21 U/L (ref 0–37)
Alkaline Phosphatase: 113 U/L (ref 39–117)
BUN: 22 mg/dL (ref 6–23)
Calcium: 9.6 mg/dL (ref 8.4–10.5)
Chloride: 110 mEq/L (ref 96–112)
Creatinine, Ser: 1.31 mg/dL — ABNORMAL HIGH (ref 0.50–1.10)
Total Bilirubin: 0.4 mg/dL (ref 0.3–1.2)

## 2011-01-13 LAB — CBC WITH DIFFERENTIAL/PLATELET
BASO%: 0.4 % (ref 0.0–2.0)
EOS%: 1.3 % (ref 0.0–7.0)
LYMPH%: 51.4 % — ABNORMAL HIGH (ref 14.0–49.7)
MCH: 31.6 pg (ref 25.1–34.0)
MCHC: 33.5 g/dL (ref 31.5–36.0)
MCV: 94.1 fL (ref 79.5–101.0)
MONO%: 14.2 % — ABNORMAL HIGH (ref 0.0–14.0)
Platelets: 264 10*3/uL (ref 145–400)
RBC: 3.39 10*6/uL — ABNORMAL LOW (ref 3.70–5.45)
RDW: 14.2 % (ref 11.2–14.5)
nRBC: 0 % (ref 0–0)

## 2011-01-20 ENCOUNTER — Other Ambulatory Visit (HOSPITAL_COMMUNITY): Payer: Medicare Other

## 2011-01-20 ENCOUNTER — Ambulatory Visit (HOSPITAL_COMMUNITY)
Admission: RE | Admit: 2011-01-20 | Discharge: 2011-01-20 | Disposition: A | Discharge status: Home / Self Care | Payer: Medicare Other | Source: Ambulatory Visit | Attending: Oncology | Admitting: Oncology

## 2011-01-20 ENCOUNTER — Other Ambulatory Visit (HOSPITAL_COMMUNITY): Payer: Self-pay | Admitting: Oncology

## 2011-01-20 ENCOUNTER — Ambulatory Visit (HOSPITAL_COMMUNITY): Admission: RE | Admit: 2011-01-20 | Payer: Medicare Other | Source: Ambulatory Visit | Admitting: Oncology

## 2011-01-20 ENCOUNTER — Encounter (HOSPITAL_BASED_OUTPATIENT_CLINIC_OR_DEPARTMENT_OTHER): Payer: Medicare Other | Admitting: Oncology

## 2011-01-20 DIAGNOSIS — Z79899 Other long term (current) drug therapy: Secondary | ICD-10-CM | POA: Insufficient documentation

## 2011-01-20 DIAGNOSIS — C3490 Malignant neoplasm of unspecified part of unspecified bronchus or lung: Secondary | ICD-10-CM

## 2011-01-20 DIAGNOSIS — Z7901 Long term (current) use of anticoagulants: Secondary | ICD-10-CM | POA: Insufficient documentation

## 2011-01-20 DIAGNOSIS — C3410 Malignant neoplasm of upper lobe, unspecified bronchus or lung: Secondary | ICD-10-CM

## 2011-01-20 LAB — CBC
HCT: 34.6 % — ABNORMAL LOW (ref 36.0–46.0)
Hemoglobin: 11.2 g/dL — ABNORMAL LOW (ref 12.0–15.0)
MCHC: 32.4 g/dL (ref 30.0–36.0)

## 2011-01-20 LAB — COMPREHENSIVE METABOLIC PANEL
AST: 25 U/L (ref 0–37)
Albumin: 4 g/dL (ref 3.5–5.2)
Alkaline Phosphatase: 85 U/L (ref 39–117)
Chloride: 108 mEq/L (ref 96–112)
Potassium: 4.2 mEq/L (ref 3.5–5.1)
Total Bilirubin: 0.3 mg/dL (ref 0.3–1.2)

## 2011-01-20 LAB — DIFFERENTIAL
Basophils Absolute: 0 10*3/uL (ref 0.0–0.1)
Lymphocytes Relative: 48 % — ABNORMAL HIGH (ref 12–46)
Lymphs Abs: 2.8 10*3/uL (ref 0.7–4.0)
Monocytes Absolute: 0.7 10*3/uL (ref 0.1–1.0)
Neutro Abs: 2.1 10*3/uL (ref 1.7–7.7)

## 2011-01-20 LAB — APTT: aPTT: 36 seconds (ref 24–37)

## 2011-01-20 LAB — PROTIME-INR: INR: 1.16 (ref 0.00–1.49)

## 2011-01-27 ENCOUNTER — Encounter (HOSPITAL_BASED_OUTPATIENT_CLINIC_OR_DEPARTMENT_OTHER): Payer: Medicare Other | Admitting: Oncology

## 2011-01-27 ENCOUNTER — Other Ambulatory Visit (HOSPITAL_COMMUNITY): Payer: Self-pay | Admitting: Oncology

## 2011-01-27 DIAGNOSIS — C3410 Malignant neoplasm of upper lobe, unspecified bronchus or lung: Secondary | ICD-10-CM

## 2011-01-27 DIAGNOSIS — Z5111 Encounter for antineoplastic chemotherapy: Secondary | ICD-10-CM

## 2011-01-27 DIAGNOSIS — C3490 Malignant neoplasm of unspecified part of unspecified bronchus or lung: Secondary | ICD-10-CM

## 2011-01-27 DIAGNOSIS — Z7901 Long term (current) use of anticoagulants: Secondary | ICD-10-CM

## 2011-01-27 LAB — CBC WITH DIFFERENTIAL/PLATELET
Eosinophils Absolute: 0.2 10*3/uL (ref 0.0–0.5)
HCT: 32.8 % — ABNORMAL LOW (ref 34.8–46.6)
LYMPH%: 43.8 % (ref 14.0–49.7)
MONO#: 0.5 10*3/uL (ref 0.1–0.9)
NEUT#: 2.4 10*3/uL (ref 1.5–6.5)
NEUT%: 42.9 % (ref 38.4–76.8)
Platelets: 217 10*3/uL (ref 145–400)
WBC: 5.6 10*3/uL (ref 3.9–10.3)

## 2011-01-27 LAB — COMPREHENSIVE METABOLIC PANEL
ALT: 19 U/L (ref 0–35)
Albumin: 4.1 g/dL (ref 3.5–5.2)
BUN: 24 mg/dL — ABNORMAL HIGH (ref 6–23)
CO2: 22 mEq/L (ref 19–32)
Calcium: 10 mg/dL (ref 8.4–10.5)
Chloride: 110 mEq/L (ref 96–112)
Creatinine, Ser: 1.4 mg/dL — ABNORMAL HIGH (ref 0.50–1.10)

## 2011-01-27 LAB — LACTATE DEHYDROGENASE: LDH: 137 U/L (ref 94–250)

## 2011-01-31 ENCOUNTER — Encounter (HOSPITAL_BASED_OUTPATIENT_CLINIC_OR_DEPARTMENT_OTHER): Payer: Medicare Other | Admitting: Oncology

## 2011-01-31 ENCOUNTER — Other Ambulatory Visit (HOSPITAL_COMMUNITY): Payer: Self-pay | Admitting: Oncology

## 2011-01-31 DIAGNOSIS — C3490 Malignant neoplasm of unspecified part of unspecified bronchus or lung: Secondary | ICD-10-CM

## 2011-01-31 DIAGNOSIS — Z7901 Long term (current) use of anticoagulants: Secondary | ICD-10-CM

## 2011-01-31 LAB — COMPREHENSIVE METABOLIC PANEL
Albumin: 4.1 g/dL (ref 3.5–5.2)
BUN: 30 mg/dL — ABNORMAL HIGH (ref 6–23)
Calcium: 9.2 mg/dL (ref 8.4–10.5)
Chloride: 110 mEq/L (ref 96–112)
Glucose, Bld: 91 mg/dL (ref 70–99)
Potassium: 4.4 mEq/L (ref 3.5–5.3)

## 2011-01-31 LAB — CBC WITH DIFFERENTIAL/PLATELET
Basophils Absolute: 0 10*3/uL (ref 0.0–0.1)
HCT: 33.8 % — ABNORMAL LOW (ref 34.8–46.6)
HGB: 11.3 g/dL — ABNORMAL LOW (ref 11.6–15.9)
MCH: 31.3 pg (ref 25.1–34.0)
MONO#: 0.4 10*3/uL (ref 0.1–0.9)
NEUT%: 39.7 % (ref 38.4–76.8)
WBC: 4.9 10*3/uL (ref 3.9–10.3)
lymph#: 2.4 10*3/uL (ref 0.9–3.3)

## 2011-01-31 LAB — PROTIME-INR: Protime: 21.6 Seconds — ABNORMAL HIGH (ref 10.6–13.4)

## 2011-02-01 ENCOUNTER — Encounter (HOSPITAL_BASED_OUTPATIENT_CLINIC_OR_DEPARTMENT_OTHER): Payer: Medicare Other | Admitting: Oncology

## 2011-02-01 DIAGNOSIS — J90 Pleural effusion, not elsewhere classified: Secondary | ICD-10-CM

## 2011-02-01 DIAGNOSIS — C3410 Malignant neoplasm of upper lobe, unspecified bronchus or lung: Secondary | ICD-10-CM

## 2011-02-04 ENCOUNTER — Encounter (HOSPITAL_BASED_OUTPATIENT_CLINIC_OR_DEPARTMENT_OTHER): Payer: Medicare Other | Admitting: Oncology

## 2011-02-04 ENCOUNTER — Other Ambulatory Visit (HOSPITAL_COMMUNITY): Payer: Self-pay | Admitting: Oncology

## 2011-02-04 ENCOUNTER — Encounter: Payer: BC Managed Care – PPO | Admitting: Oncology

## 2011-02-04 DIAGNOSIS — C3410 Malignant neoplasm of upper lobe, unspecified bronchus or lung: Secondary | ICD-10-CM

## 2011-02-04 DIAGNOSIS — J90 Pleural effusion, not elsewhere classified: Secondary | ICD-10-CM

## 2011-02-04 DIAGNOSIS — Z7901 Long term (current) use of anticoagulants: Secondary | ICD-10-CM

## 2011-02-04 LAB — BASIC METABOLIC PANEL
CO2: 22 mEq/L (ref 19–32)
Glucose, Bld: 71 mg/dL (ref 70–99)
Potassium: 3.9 mEq/L (ref 3.5–5.3)
Sodium: 141 mEq/L (ref 135–145)

## 2011-02-04 LAB — CBC WITH DIFFERENTIAL/PLATELET
Eosinophils Absolute: 0.1 10*3/uL (ref 0.0–0.5)
MONO#: 0.6 10*3/uL (ref 0.1–0.9)
MONO%: 17.1 % — ABNORMAL HIGH (ref 0.0–14.0)
NEUT#: 1.4 10*3/uL — ABNORMAL LOW (ref 1.5–6.5)
RBC: 3.18 10*6/uL — ABNORMAL LOW (ref 3.70–5.45)
RDW: 13.6 % (ref 11.2–14.5)
WBC: 3.6 10*3/uL — ABNORMAL LOW (ref 3.9–10.3)
nRBC: 0 % (ref 0–0)

## 2011-02-04 LAB — PROTIME-INR: Protime: 24 Seconds — ABNORMAL HIGH (ref 10.6–13.4)

## 2011-02-11 ENCOUNTER — Other Ambulatory Visit (HOSPITAL_COMMUNITY): Payer: Self-pay | Admitting: Oncology

## 2011-02-11 ENCOUNTER — Encounter (HOSPITAL_BASED_OUTPATIENT_CLINIC_OR_DEPARTMENT_OTHER): Payer: Medicare Other | Admitting: Oncology

## 2011-02-11 DIAGNOSIS — C3410 Malignant neoplasm of upper lobe, unspecified bronchus or lung: Secondary | ICD-10-CM

## 2011-02-11 DIAGNOSIS — J90 Pleural effusion, not elsewhere classified: Secondary | ICD-10-CM

## 2011-02-11 DIAGNOSIS — Z7901 Long term (current) use of anticoagulants: Secondary | ICD-10-CM

## 2011-02-11 DIAGNOSIS — Z5111 Encounter for antineoplastic chemotherapy: Secondary | ICD-10-CM

## 2011-02-11 LAB — CBC WITH DIFFERENTIAL/PLATELET
BASO%: 0.8 % (ref 0.0–2.0)
Basophils Absolute: 0 10*3/uL (ref 0.0–0.1)
EOS%: 1.3 % (ref 0.0–7.0)
HGB: 9.9 g/dL — ABNORMAL LOW (ref 11.6–15.9)
MCH: 31.2 pg (ref 25.1–34.0)
MCHC: 33.8 g/dL (ref 31.5–36.0)
MCV: 92.4 fL (ref 79.5–101.0)
MONO%: 14.5 % — ABNORMAL HIGH (ref 0.0–14.0)
NEUT%: 52.8 % (ref 38.4–76.8)
RDW: 14.2 % (ref 11.2–14.5)

## 2011-02-11 LAB — COMPREHENSIVE METABOLIC PANEL
AST: 23 U/L (ref 0–37)
Alkaline Phosphatase: 102 U/L (ref 39–117)
BUN: 40 mg/dL — ABNORMAL HIGH (ref 6–23)
Glucose, Bld: 91 mg/dL (ref 70–99)
Sodium: 141 mEq/L (ref 135–145)
Total Bilirubin: 0.2 mg/dL — ABNORMAL LOW (ref 0.3–1.2)

## 2011-02-11 LAB — PROTIME-INR

## 2011-02-15 ENCOUNTER — Encounter (HOSPITAL_BASED_OUTPATIENT_CLINIC_OR_DEPARTMENT_OTHER): Payer: Medicare Other | Admitting: Oncology

## 2011-02-15 ENCOUNTER — Other Ambulatory Visit (HOSPITAL_COMMUNITY): Payer: Self-pay | Admitting: Oncology

## 2011-02-15 DIAGNOSIS — J90 Pleural effusion, not elsewhere classified: Secondary | ICD-10-CM

## 2011-02-15 DIAGNOSIS — Z7901 Long term (current) use of anticoagulants: Secondary | ICD-10-CM

## 2011-02-15 LAB — PROTIME-INR
INR: 1.7 — ABNORMAL LOW (ref 2.00–3.50)
Protime: 20.4 Seconds — ABNORMAL HIGH (ref 10.6–13.4)

## 2011-02-18 ENCOUNTER — Other Ambulatory Visit (HOSPITAL_COMMUNITY): Payer: Self-pay | Admitting: Oncology

## 2011-02-18 ENCOUNTER — Encounter (HOSPITAL_BASED_OUTPATIENT_CLINIC_OR_DEPARTMENT_OTHER): Payer: Medicare Other | Admitting: Oncology

## 2011-02-18 DIAGNOSIS — C3410 Malignant neoplasm of upper lobe, unspecified bronchus or lung: Secondary | ICD-10-CM

## 2011-02-18 DIAGNOSIS — Z7901 Long term (current) use of anticoagulants: Secondary | ICD-10-CM

## 2011-02-18 LAB — CBC WITH DIFFERENTIAL/PLATELET
EOS%: 1.3 % (ref 0.0–7.0)
Eosinophils Absolute: 0 10*3/uL (ref 0.0–0.5)
MCV: 94.2 fL (ref 79.5–101.0)
MONO%: 15.7 % — ABNORMAL HIGH (ref 0.0–14.0)
NEUT#: 1.7 10*3/uL (ref 1.5–6.5)
RBC: 3.36 10*6/uL — ABNORMAL LOW (ref 3.70–5.45)
RDW: 14.4 % (ref 11.2–14.5)
WBC: 2.7 10*3/uL — ABNORMAL LOW (ref 3.9–10.3)
lymph#: 0.6 10*3/uL — ABNORMAL LOW (ref 0.9–3.3)

## 2011-02-18 LAB — BASIC METABOLIC PANEL
Chloride: 106 mEq/L (ref 96–112)
Glucose, Bld: 100 mg/dL — ABNORMAL HIGH (ref 70–99)
Potassium: 4.4 mEq/L (ref 3.5–5.3)
Sodium: 137 mEq/L (ref 135–145)

## 2011-02-18 LAB — PROTIME-INR
INR: 2 (ref 2.00–3.50)
Protime: 24 Seconds — ABNORMAL HIGH (ref 10.6–13.4)

## 2011-02-25 ENCOUNTER — Other Ambulatory Visit (HOSPITAL_COMMUNITY): Payer: Self-pay | Admitting: Oncology

## 2011-02-25 ENCOUNTER — Encounter (HOSPITAL_BASED_OUTPATIENT_CLINIC_OR_DEPARTMENT_OTHER): Payer: Medicare Other | Admitting: Oncology

## 2011-02-25 DIAGNOSIS — Z5111 Encounter for antineoplastic chemotherapy: Secondary | ICD-10-CM

## 2011-02-25 DIAGNOSIS — C3410 Malignant neoplasm of upper lobe, unspecified bronchus or lung: Secondary | ICD-10-CM

## 2011-02-25 DIAGNOSIS — Z7901 Long term (current) use of anticoagulants: Secondary | ICD-10-CM

## 2011-02-25 DIAGNOSIS — R32 Unspecified urinary incontinence: Secondary | ICD-10-CM

## 2011-02-25 DIAGNOSIS — J90 Pleural effusion, not elsewhere classified: Secondary | ICD-10-CM

## 2011-02-25 LAB — COMPREHENSIVE METABOLIC PANEL
Albumin: 3.6 g/dL (ref 3.5–5.2)
CO2: 19 mEq/L (ref 19–32)
Calcium: 10 mg/dL (ref 8.4–10.5)
Glucose, Bld: 88 mg/dL (ref 70–99)
Potassium: 4.4 mEq/L (ref 3.5–5.3)
Sodium: 138 mEq/L (ref 135–145)
Total Bilirubin: 0.2 mg/dL — ABNORMAL LOW (ref 0.3–1.2)
Total Protein: 7.6 g/dL (ref 6.0–8.3)

## 2011-02-25 LAB — CBC WITH DIFFERENTIAL/PLATELET
Eosinophils Absolute: 0 10*3/uL (ref 0.0–0.5)
HCT: 29.7 % — ABNORMAL LOW (ref 34.8–46.6)
LYMPH%: 17.4 % (ref 14.0–49.7)
MONO#: 0.6 10*3/uL (ref 0.1–0.9)
NEUT#: 1.6 10*3/uL (ref 1.5–6.5)
NEUT%: 60.4 % (ref 38.4–76.8)
Platelets: 166 10*3/uL (ref 145–400)
WBC: 2.7 10*3/uL — ABNORMAL LOW (ref 3.9–10.3)

## 2011-03-01 ENCOUNTER — Encounter (HOSPITAL_BASED_OUTPATIENT_CLINIC_OR_DEPARTMENT_OTHER): Payer: Medicare Other | Admitting: Oncology

## 2011-03-01 ENCOUNTER — Other Ambulatory Visit (HOSPITAL_COMMUNITY): Payer: Self-pay | Admitting: Oncology

## 2011-03-01 DIAGNOSIS — Z5111 Encounter for antineoplastic chemotherapy: Secondary | ICD-10-CM

## 2011-03-01 DIAGNOSIS — Z7901 Long term (current) use of anticoagulants: Secondary | ICD-10-CM

## 2011-03-01 DIAGNOSIS — J90 Pleural effusion, not elsewhere classified: Secondary | ICD-10-CM

## 2011-03-01 LAB — PROTIME-INR
INR: 2.2 (ref 2.00–3.50)
Protime: 26.4 Seconds — ABNORMAL HIGH (ref 10.6–13.4)

## 2011-03-04 ENCOUNTER — Encounter (HOSPITAL_BASED_OUTPATIENT_CLINIC_OR_DEPARTMENT_OTHER): Payer: Medicare Other | Admitting: Oncology

## 2011-03-04 ENCOUNTER — Other Ambulatory Visit (HOSPITAL_COMMUNITY): Payer: Self-pay | Admitting: Oncology

## 2011-03-04 DIAGNOSIS — Z7901 Long term (current) use of anticoagulants: Secondary | ICD-10-CM

## 2011-03-04 DIAGNOSIS — J90 Pleural effusion, not elsewhere classified: Secondary | ICD-10-CM

## 2011-03-04 DIAGNOSIS — C3490 Malignant neoplasm of unspecified part of unspecified bronchus or lung: Secondary | ICD-10-CM

## 2011-03-04 DIAGNOSIS — Z5111 Encounter for antineoplastic chemotherapy: Secondary | ICD-10-CM

## 2011-03-04 LAB — CBC WITH DIFFERENTIAL/PLATELET
Basophils Absolute: 0 10*3/uL (ref 0.0–0.1)
EOS%: 0.3 % (ref 0.0–7.0)
HCT: 29.6 % — ABNORMAL LOW (ref 34.8–46.6)
HGB: 10.3 g/dL — ABNORMAL LOW (ref 11.6–15.9)
MCH: 32.5 pg (ref 25.1–34.0)
MCV: 93.5 fL (ref 79.5–101.0)
MONO%: 11.7 % (ref 0.0–14.0)
NEUT%: 74.2 % (ref 38.4–76.8)
Platelets: 163 10*3/uL (ref 145–400)

## 2011-03-04 LAB — BASIC METABOLIC PANEL
BUN: 37 mg/dL — ABNORMAL HIGH (ref 6–23)
Creatinine, Ser: 1.42 mg/dL — ABNORMAL HIGH (ref 0.50–1.10)
Glucose, Bld: 80 mg/dL (ref 70–99)

## 2011-03-10 ENCOUNTER — Encounter (HOSPITAL_BASED_OUTPATIENT_CLINIC_OR_DEPARTMENT_OTHER): Payer: Medicare Other | Admitting: Oncology

## 2011-03-10 ENCOUNTER — Other Ambulatory Visit (HOSPITAL_COMMUNITY): Payer: Self-pay | Admitting: Oncology

## 2011-03-10 DIAGNOSIS — D696 Thrombocytopenia, unspecified: Secondary | ICD-10-CM

## 2011-03-10 DIAGNOSIS — D72819 Decreased white blood cell count, unspecified: Secondary | ICD-10-CM

## 2011-03-10 DIAGNOSIS — Z7901 Long term (current) use of anticoagulants: Secondary | ICD-10-CM

## 2011-03-10 DIAGNOSIS — C3410 Malignant neoplasm of upper lobe, unspecified bronchus or lung: Secondary | ICD-10-CM

## 2011-03-10 LAB — COMPREHENSIVE METABOLIC PANEL
Alkaline Phosphatase: 96 U/L (ref 39–117)
BUN: 29 mg/dL — ABNORMAL HIGH (ref 6–23)
Creatinine, Ser: 1.36 mg/dL — ABNORMAL HIGH (ref 0.50–1.10)
Glucose, Bld: 82 mg/dL (ref 70–99)
Total Bilirubin: 0.3 mg/dL (ref 0.3–1.2)

## 2011-03-10 LAB — PROTIME-INR
INR: 2 (ref 2.00–3.50)
Protime: 24 Seconds — ABNORMAL HIGH (ref 10.6–13.4)

## 2011-03-10 LAB — CBC WITH DIFFERENTIAL/PLATELET
Basophils Absolute: 0 10*3/uL (ref 0.0–0.1)
Eosinophils Absolute: 0 10*3/uL (ref 0.0–0.5)
HCT: 29.6 % — ABNORMAL LOW (ref 34.8–46.6)
HGB: 10.4 g/dL — ABNORMAL LOW (ref 11.6–15.9)
LYMPH%: 15.2 % (ref 14.0–49.7)
MCV: 94 fL (ref 79.5–101.0)
MONO%: 18.6 % — ABNORMAL HIGH (ref 0.0–14.0)
NEUT#: 1.5 10*3/uL (ref 1.5–6.5)
NEUT%: 65.5 % (ref 38.4–76.8)
Platelets: 131 10*3/uL — ABNORMAL LOW (ref 145–400)
RBC: 3.15 10*6/uL — ABNORMAL LOW (ref 3.70–5.45)

## 2011-03-10 LAB — LACTATE DEHYDROGENASE: LDH: 167 U/L (ref 94–250)

## 2011-03-17 ENCOUNTER — Other Ambulatory Visit (HOSPITAL_COMMUNITY): Payer: Self-pay | Admitting: Oncology

## 2011-03-17 ENCOUNTER — Encounter (HOSPITAL_BASED_OUTPATIENT_CLINIC_OR_DEPARTMENT_OTHER): Payer: Medicare Other | Admitting: Oncology

## 2011-03-17 DIAGNOSIS — Z7901 Long term (current) use of anticoagulants: Secondary | ICD-10-CM

## 2011-03-17 LAB — CBC WITH DIFFERENTIAL/PLATELET
BASO%: 0.4 % (ref 0.0–2.0)
Eosinophils Absolute: 0 10*3/uL (ref 0.0–0.5)
MCHC: 33.4 g/dL (ref 31.5–36.0)
MCV: 94 fL (ref 79.5–101.0)
MONO#: 0.5 10*3/uL (ref 0.1–0.9)
MONO%: 21.8 % — ABNORMAL HIGH (ref 0.0–14.0)
NEUT#: 1.3 10*3/uL — ABNORMAL LOW (ref 1.5–6.5)
RBC: 3.15 10*6/uL — ABNORMAL LOW (ref 3.70–5.45)
RDW: 16 % — ABNORMAL HIGH (ref 11.2–14.5)
WBC: 2.4 10*3/uL — ABNORMAL LOW (ref 3.9–10.3)
nRBC: 0 % (ref 0–0)

## 2011-03-17 LAB — PROTIME-INR
INR: 1.8 — ABNORMAL LOW (ref 2.00–3.50)
Protime: 21.6 Seconds — ABNORMAL HIGH (ref 10.6–13.4)

## 2011-03-24 ENCOUNTER — Other Ambulatory Visit (HOSPITAL_COMMUNITY): Payer: Self-pay | Admitting: Oncology

## 2011-03-24 ENCOUNTER — Encounter (HOSPITAL_BASED_OUTPATIENT_CLINIC_OR_DEPARTMENT_OTHER): Payer: Medicare Other | Admitting: Oncology

## 2011-03-24 DIAGNOSIS — Z7901 Long term (current) use of anticoagulants: Secondary | ICD-10-CM

## 2011-03-24 LAB — PROTIME-INR
INR: 2 (ref 2.00–3.50)
Protime: 24 Seconds — ABNORMAL HIGH (ref 10.6–13.4)

## 2011-03-29 ENCOUNTER — Other Ambulatory Visit (HOSPITAL_COMMUNITY): Payer: Self-pay | Admitting: Oncology

## 2011-03-29 ENCOUNTER — Encounter (HOSPITAL_BASED_OUTPATIENT_CLINIC_OR_DEPARTMENT_OTHER): Payer: Medicare Other | Admitting: Oncology

## 2011-03-29 DIAGNOSIS — C3410 Malignant neoplasm of upper lobe, unspecified bronchus or lung: Secondary | ICD-10-CM

## 2011-03-29 DIAGNOSIS — C3490 Malignant neoplasm of unspecified part of unspecified bronchus or lung: Secondary | ICD-10-CM

## 2011-03-29 DIAGNOSIS — Z7901 Long term (current) use of anticoagulants: Secondary | ICD-10-CM

## 2011-03-29 LAB — BASIC METABOLIC PANEL
BUN: 28 mg/dL — ABNORMAL HIGH (ref 6–23)
CO2: 22 mEq/L (ref 19–32)
Chloride: 105 mEq/L (ref 96–112)
Creatinine, Ser: 1.55 mg/dL — ABNORMAL HIGH (ref 0.50–1.10)
Glucose, Bld: 81 mg/dL (ref 70–99)
Potassium: 3.9 mEq/L (ref 3.5–5.3)

## 2011-03-29 LAB — CBC WITH DIFFERENTIAL/PLATELET
Basophils Absolute: 0 10*3/uL (ref 0.0–0.1)
EOS%: 1.1 % (ref 0.0–7.0)
Eosinophils Absolute: 0 10*3/uL (ref 0.0–0.5)
HGB: 10.3 g/dL — ABNORMAL LOW (ref 11.6–15.9)
LYMPH%: 25.5 % (ref 14.0–49.7)
MCH: 33.3 pg (ref 25.1–34.0)
MCV: 96.4 fL (ref 79.5–101.0)
MONO%: 14.8 % — ABNORMAL HIGH (ref 0.0–14.0)
NEUT#: 2 10*3/uL (ref 1.5–6.5)
Platelets: 191 10*3/uL (ref 145–400)
RBC: 3.1 10*6/uL — ABNORMAL LOW (ref 3.70–5.45)
RDW: 17.6 % — ABNORMAL HIGH (ref 11.2–14.5)

## 2011-03-29 LAB — PROTIME-INR: INR: 1.8 — ABNORMAL LOW (ref 2.00–3.50)

## 2011-04-05 ENCOUNTER — Encounter (HOSPITAL_BASED_OUTPATIENT_CLINIC_OR_DEPARTMENT_OTHER): Payer: Medicare Other | Admitting: Oncology

## 2011-04-05 ENCOUNTER — Other Ambulatory Visit (HOSPITAL_COMMUNITY): Payer: Self-pay | Admitting: Oncology

## 2011-04-05 DIAGNOSIS — Z7901 Long term (current) use of anticoagulants: Secondary | ICD-10-CM

## 2011-04-05 DIAGNOSIS — Z5111 Encounter for antineoplastic chemotherapy: Secondary | ICD-10-CM

## 2011-04-05 DIAGNOSIS — J90 Pleural effusion, not elsewhere classified: Secondary | ICD-10-CM

## 2011-04-05 LAB — PROTIME-INR: INR: 2.1 (ref 2.00–3.50)

## 2011-04-12 ENCOUNTER — Other Ambulatory Visit (HOSPITAL_COMMUNITY): Payer: Self-pay | Admitting: Oncology

## 2011-04-12 ENCOUNTER — Encounter (HOSPITAL_BASED_OUTPATIENT_CLINIC_OR_DEPARTMENT_OTHER): Payer: Medicare Other | Admitting: Oncology

## 2011-04-12 DIAGNOSIS — Z5111 Encounter for antineoplastic chemotherapy: Secondary | ICD-10-CM

## 2011-04-12 DIAGNOSIS — C3410 Malignant neoplasm of upper lobe, unspecified bronchus or lung: Secondary | ICD-10-CM

## 2011-04-12 DIAGNOSIS — Z7901 Long term (current) use of anticoagulants: Secondary | ICD-10-CM

## 2011-04-12 DIAGNOSIS — J90 Pleural effusion, not elsewhere classified: Secondary | ICD-10-CM

## 2011-04-12 LAB — PROTIME-INR
INR: 3.8 — ABNORMAL HIGH (ref 2.00–3.50)
Protime: 45.6 Seconds — ABNORMAL HIGH (ref 10.6–13.4)

## 2011-04-12 LAB — COMPREHENSIVE METABOLIC PANEL
ALT: 11 U/L (ref 0–35)
AST: 18 U/L (ref 0–37)
Alkaline Phosphatase: 82 U/L (ref 39–117)
Chloride: 108 mEq/L (ref 96–112)
Creatinine, Ser: 1.55 mg/dL — ABNORMAL HIGH (ref 0.50–1.10)
Total Bilirubin: 0.4 mg/dL (ref 0.3–1.2)

## 2011-04-18 ENCOUNTER — Encounter (HOSPITAL_BASED_OUTPATIENT_CLINIC_OR_DEPARTMENT_OTHER): Payer: Medicare Other | Admitting: Oncology

## 2011-04-18 ENCOUNTER — Other Ambulatory Visit (HOSPITAL_COMMUNITY): Payer: Self-pay | Admitting: Oncology

## 2011-04-18 DIAGNOSIS — Z5111 Encounter for antineoplastic chemotherapy: Secondary | ICD-10-CM

## 2011-04-18 DIAGNOSIS — J90 Pleural effusion, not elsewhere classified: Secondary | ICD-10-CM

## 2011-04-18 DIAGNOSIS — C3410 Malignant neoplasm of upper lobe, unspecified bronchus or lung: Secondary | ICD-10-CM

## 2011-04-18 DIAGNOSIS — Z7901 Long term (current) use of anticoagulants: Secondary | ICD-10-CM

## 2011-04-19 ENCOUNTER — Encounter (HOSPITAL_COMMUNITY): Payer: Self-pay

## 2011-04-19 ENCOUNTER — Ambulatory Visit (HOSPITAL_COMMUNITY)
Admission: RE | Admit: 2011-04-19 | Discharge: 2011-04-19 | Disposition: A | Discharge status: Home / Self Care | Payer: Medicare Other | Source: Ambulatory Visit | Attending: Oncology | Admitting: Oncology

## 2011-04-19 DIAGNOSIS — C3490 Malignant neoplasm of unspecified part of unspecified bronchus or lung: Secondary | ICD-10-CM | POA: Insufficient documentation

## 2011-04-19 DIAGNOSIS — J984 Other disorders of lung: Secondary | ICD-10-CM | POA: Insufficient documentation

## 2011-04-19 DIAGNOSIS — R599 Enlarged lymph nodes, unspecified: Secondary | ICD-10-CM | POA: Insufficient documentation

## 2011-04-20 ENCOUNTER — Other Ambulatory Visit (HOSPITAL_COMMUNITY): Payer: Self-pay | Admitting: Oncology

## 2011-04-20 ENCOUNTER — Encounter (HOSPITAL_BASED_OUTPATIENT_CLINIC_OR_DEPARTMENT_OTHER): Payer: Medicare Other | Admitting: Oncology

## 2011-04-20 DIAGNOSIS — Z7901 Long term (current) use of anticoagulants: Secondary | ICD-10-CM

## 2011-04-20 LAB — PROTIME-INR
INR: 3.4 (ref 2.00–3.50)
Protime: 40.8 Seconds — ABNORMAL HIGH (ref 10.6–13.4)

## 2011-04-21 ENCOUNTER — Ambulatory Visit
Admission: RE | Admit: 2011-04-21 | Discharge: 2011-04-21 | Disposition: A | Discharge status: Home / Self Care | Payer: Medicare Other | Source: Ambulatory Visit | Attending: Radiation Oncology | Admitting: Radiation Oncology

## 2011-04-22 ENCOUNTER — Other Ambulatory Visit (HOSPITAL_COMMUNITY): Payer: Self-pay | Admitting: Oncology

## 2011-04-22 ENCOUNTER — Encounter (HOSPITAL_BASED_OUTPATIENT_CLINIC_OR_DEPARTMENT_OTHER): Payer: Medicare Other | Admitting: Oncology

## 2011-04-22 DIAGNOSIS — Z7901 Long term (current) use of anticoagulants: Secondary | ICD-10-CM

## 2011-04-22 DIAGNOSIS — Z5111 Encounter for antineoplastic chemotherapy: Secondary | ICD-10-CM

## 2011-04-22 DIAGNOSIS — J90 Pleural effusion, not elsewhere classified: Secondary | ICD-10-CM

## 2011-04-22 DIAGNOSIS — C3410 Malignant neoplasm of upper lobe, unspecified bronchus or lung: Secondary | ICD-10-CM

## 2011-04-22 LAB — CBC WITH DIFFERENTIAL/PLATELET
BASO%: 0.5 % (ref 0.0–2.0)
Basophils Absolute: 0 10*3/uL (ref 0.0–0.1)
Eosinophils Absolute: 0.3 10*3/uL (ref 0.0–0.5)
HCT: 31.7 % — ABNORMAL LOW (ref 34.8–46.6)
HGB: 10.9 g/dL — ABNORMAL LOW (ref 11.6–15.9)
LYMPH%: 24.4 % (ref 14.0–49.7)
MONO#: 0.6 10*3/uL (ref 0.1–0.9)
NEUT#: 2.5 10*3/uL (ref 1.5–6.5)
NEUT%: 55.3 % (ref 38.4–76.8)
Platelets: 285 10*3/uL (ref 145–400)
WBC: 4.6 10*3/uL (ref 3.9–10.3)
lymph#: 1.1 10*3/uL (ref 0.9–3.3)

## 2011-04-22 LAB — CEA: CEA: 1.1 ng/mL (ref 0.0–5.0)

## 2011-04-22 LAB — COMPREHENSIVE METABOLIC PANEL
ALT: 15 U/L (ref 0–35)
BUN: 31 mg/dL — ABNORMAL HIGH (ref 6–23)
CO2: 19 mEq/L (ref 19–32)
Calcium: 9.4 mg/dL (ref 8.4–10.5)
Chloride: 107 mEq/L (ref 96–112)
Creatinine, Ser: 1.64 mg/dL — ABNORMAL HIGH (ref 0.50–1.10)
Glucose, Bld: 80 mg/dL (ref 70–99)

## 2011-04-22 LAB — PROTIME-INR

## 2011-04-25 ENCOUNTER — Other Ambulatory Visit (HOSPITAL_COMMUNITY): Payer: Self-pay | Admitting: Oncology

## 2011-04-25 ENCOUNTER — Encounter (HOSPITAL_BASED_OUTPATIENT_CLINIC_OR_DEPARTMENT_OTHER): Payer: Medicare Other | Admitting: Oncology

## 2011-04-25 DIAGNOSIS — Z7901 Long term (current) use of anticoagulants: Secondary | ICD-10-CM

## 2011-04-25 DIAGNOSIS — C3410 Malignant neoplasm of upper lobe, unspecified bronchus or lung: Secondary | ICD-10-CM

## 2011-04-25 DIAGNOSIS — Z452 Encounter for adjustment and management of vascular access device: Secondary | ICD-10-CM

## 2011-04-25 LAB — PROTIME-INR
INR: 2.4 (ref 2.00–3.50)
Protime: 28.8 Seconds — ABNORMAL HIGH (ref 10.6–13.4)

## 2011-04-28 ENCOUNTER — Encounter (HOSPITAL_BASED_OUTPATIENT_CLINIC_OR_DEPARTMENT_OTHER): Payer: Medicare Other | Admitting: Oncology

## 2011-04-28 ENCOUNTER — Other Ambulatory Visit (HOSPITAL_COMMUNITY): Payer: Self-pay | Admitting: Oncology

## 2011-04-28 DIAGNOSIS — Z5111 Encounter for antineoplastic chemotherapy: Secondary | ICD-10-CM

## 2011-04-28 DIAGNOSIS — J90 Pleural effusion, not elsewhere classified: Secondary | ICD-10-CM

## 2011-04-28 DIAGNOSIS — Z7901 Long term (current) use of anticoagulants: Secondary | ICD-10-CM

## 2011-04-28 DIAGNOSIS — C3410 Malignant neoplasm of upper lobe, unspecified bronchus or lung: Secondary | ICD-10-CM

## 2011-05-02 ENCOUNTER — Encounter (HOSPITAL_BASED_OUTPATIENT_CLINIC_OR_DEPARTMENT_OTHER): Payer: Medicare Other | Admitting: Oncology

## 2011-05-02 ENCOUNTER — Other Ambulatory Visit (HOSPITAL_COMMUNITY): Payer: Self-pay | Admitting: Oncology

## 2011-05-02 DIAGNOSIS — Z7901 Long term (current) use of anticoagulants: Secondary | ICD-10-CM

## 2011-05-02 DIAGNOSIS — J90 Pleural effusion, not elsewhere classified: Secondary | ICD-10-CM

## 2011-05-02 DIAGNOSIS — Z5111 Encounter for antineoplastic chemotherapy: Secondary | ICD-10-CM

## 2011-05-02 DIAGNOSIS — C3410 Malignant neoplasm of upper lobe, unspecified bronchus or lung: Secondary | ICD-10-CM

## 2011-05-02 LAB — PROTIME-INR: Protime: 24 Seconds — ABNORMAL HIGH (ref 10.6–13.4)

## 2011-05-10 ENCOUNTER — Other Ambulatory Visit (HOSPITAL_COMMUNITY): Payer: Self-pay | Admitting: Oncology

## 2011-05-10 ENCOUNTER — Encounter (HOSPITAL_BASED_OUTPATIENT_CLINIC_OR_DEPARTMENT_OTHER): Payer: Medicare Other | Admitting: Oncology

## 2011-05-10 DIAGNOSIS — Z7901 Long term (current) use of anticoagulants: Secondary | ICD-10-CM

## 2011-05-10 DIAGNOSIS — Z5111 Encounter for antineoplastic chemotherapy: Secondary | ICD-10-CM

## 2011-05-10 DIAGNOSIS — I4891 Unspecified atrial fibrillation: Secondary | ICD-10-CM

## 2011-05-10 DIAGNOSIS — G609 Hereditary and idiopathic neuropathy, unspecified: Secondary | ICD-10-CM

## 2011-05-10 DIAGNOSIS — J90 Pleural effusion, not elsewhere classified: Secondary | ICD-10-CM

## 2011-05-10 DIAGNOSIS — C3410 Malignant neoplasm of upper lobe, unspecified bronchus or lung: Secondary | ICD-10-CM

## 2011-05-10 LAB — PROTIME-INR
INR: 3.3 (ref 2.00–3.50)
Protime: 39.6 Seconds — ABNORMAL HIGH (ref 10.6–13.4)

## 2011-05-10 LAB — CBC WITH DIFFERENTIAL/PLATELET
Eosinophils Absolute: 0.1 10*3/uL (ref 0.0–0.5)
HGB: 11.9 g/dL (ref 11.6–15.9)
LYMPH%: 21.7 % (ref 14.0–49.7)
MONO#: 0.7 10*3/uL (ref 0.1–0.9)
NEUT#: 3.4 10*3/uL (ref 1.5–6.5)
Platelets: 224 10*3/uL (ref 145–400)
RBC: 3.37 10*6/uL — ABNORMAL LOW (ref 3.70–5.45)
WBC: 5.3 10*3/uL (ref 3.9–10.3)

## 2011-05-10 LAB — COMPREHENSIVE METABOLIC PANEL
ALT: 15 U/L (ref 0–35)
Alkaline Phosphatase: 118 U/L — ABNORMAL HIGH (ref 39–117)
Sodium: 141 mEq/L (ref 135–145)
Total Bilirubin: 0.6 mg/dL (ref 0.3–1.2)
Total Protein: 8.4 g/dL — ABNORMAL HIGH (ref 6.0–8.3)

## 2011-05-12 ENCOUNTER — Encounter (HOSPITAL_BASED_OUTPATIENT_CLINIC_OR_DEPARTMENT_OTHER): Payer: Medicare Other | Admitting: Oncology

## 2011-05-12 ENCOUNTER — Other Ambulatory Visit (HOSPITAL_COMMUNITY): Payer: Self-pay | Admitting: Oncology

## 2011-05-12 DIAGNOSIS — Z5111 Encounter for antineoplastic chemotherapy: Secondary | ICD-10-CM

## 2011-05-12 DIAGNOSIS — Z7901 Long term (current) use of anticoagulants: Secondary | ICD-10-CM

## 2011-05-12 DIAGNOSIS — C3410 Malignant neoplasm of upper lobe, unspecified bronchus or lung: Secondary | ICD-10-CM

## 2011-05-12 LAB — PROTIME-INR

## 2011-05-13 ENCOUNTER — Encounter (HOSPITAL_BASED_OUTPATIENT_CLINIC_OR_DEPARTMENT_OTHER): Payer: Medicare Other | Admitting: Oncology

## 2011-05-13 DIAGNOSIS — C3410 Malignant neoplasm of upper lobe, unspecified bronchus or lung: Secondary | ICD-10-CM

## 2011-05-13 DIAGNOSIS — Z5189 Encounter for other specified aftercare: Secondary | ICD-10-CM

## 2011-05-16 ENCOUNTER — Encounter (HOSPITAL_BASED_OUTPATIENT_CLINIC_OR_DEPARTMENT_OTHER): Payer: Medicare Other | Admitting: Oncology

## 2011-05-16 ENCOUNTER — Other Ambulatory Visit (HOSPITAL_COMMUNITY): Payer: Self-pay | Admitting: Oncology

## 2011-05-16 DIAGNOSIS — Z7901 Long term (current) use of anticoagulants: Secondary | ICD-10-CM

## 2011-05-16 LAB — PROTIME-INR
INR: 2 (ref 2.00–3.50)
Protime: 24 Seconds — ABNORMAL HIGH (ref 10.6–13.4)

## 2011-05-23 ENCOUNTER — Other Ambulatory Visit (HOSPITAL_COMMUNITY): Payer: Self-pay | Admitting: Oncology

## 2011-05-23 ENCOUNTER — Encounter (HOSPITAL_BASED_OUTPATIENT_CLINIC_OR_DEPARTMENT_OTHER): Payer: Medicare Other | Admitting: Oncology

## 2011-05-23 DIAGNOSIS — Z7901 Long term (current) use of anticoagulants: Secondary | ICD-10-CM

## 2011-05-23 DIAGNOSIS — J90 Pleural effusion, not elsewhere classified: Secondary | ICD-10-CM

## 2011-05-23 LAB — CBC WITH DIFFERENTIAL/PLATELET
BASO%: 0.3 % (ref 0.0–2.0)
Eosinophils Absolute: 0.1 10*3/uL (ref 0.0–0.5)
HCT: 28.1 % — ABNORMAL LOW (ref 34.8–46.6)
MCHC: 33.8 g/dL (ref 31.5–36.0)
MONO#: 1 10*3/uL — ABNORMAL HIGH (ref 0.1–0.9)
NEUT#: 5.4 10*3/uL (ref 1.5–6.5)
NEUT%: 69.6 % (ref 38.4–76.8)
RBC: 2.8 10*6/uL — ABNORMAL LOW (ref 3.70–5.45)
WBC: 7.7 10*3/uL (ref 3.9–10.3)
lymph#: 1.3 10*3/uL (ref 0.9–3.3)
nRBC: 0 % (ref 0–0)

## 2011-05-23 LAB — PROTIME-INR

## 2011-05-27 ENCOUNTER — Other Ambulatory Visit (HOSPITAL_COMMUNITY): Payer: Self-pay | Admitting: Oncology

## 2011-05-27 ENCOUNTER — Encounter (HOSPITAL_BASED_OUTPATIENT_CLINIC_OR_DEPARTMENT_OTHER): Payer: Medicare Other | Admitting: Oncology

## 2011-05-27 DIAGNOSIS — Z7901 Long term (current) use of anticoagulants: Secondary | ICD-10-CM

## 2011-05-27 DIAGNOSIS — J90 Pleural effusion, not elsewhere classified: Secondary | ICD-10-CM

## 2011-05-27 LAB — CBC WITH DIFFERENTIAL/PLATELET
Basophils Absolute: 0 10*3/uL (ref 0.0–0.1)
Eosinophils Absolute: 0.1 10*3/uL (ref 0.0–0.5)
HCT: 27.8 % — ABNORMAL LOW (ref 34.8–46.6)
HGB: 9.5 g/dL — ABNORMAL LOW (ref 11.6–15.9)
NEUT#: 5.5 10*3/uL (ref 1.5–6.5)
RDW: 13.2 % (ref 11.2–14.5)
lymph#: 0.9 10*3/uL (ref 0.9–3.3)

## 2011-05-27 LAB — PROTIME-INR: INR: 1.9 — ABNORMAL LOW (ref 2.00–3.50)

## 2011-06-02 ENCOUNTER — Other Ambulatory Visit (HOSPITAL_COMMUNITY): Payer: Self-pay | Admitting: Oncology

## 2011-06-02 ENCOUNTER — Encounter (HOSPITAL_BASED_OUTPATIENT_CLINIC_OR_DEPARTMENT_OTHER): Payer: Medicare Other | Admitting: Oncology

## 2011-06-02 DIAGNOSIS — R030 Elevated blood-pressure reading, without diagnosis of hypertension: Secondary | ICD-10-CM

## 2011-06-02 DIAGNOSIS — Z7901 Long term (current) use of anticoagulants: Secondary | ICD-10-CM

## 2011-06-02 DIAGNOSIS — Z5111 Encounter for antineoplastic chemotherapy: Secondary | ICD-10-CM

## 2011-06-02 DIAGNOSIS — J90 Pleural effusion, not elsewhere classified: Secondary | ICD-10-CM

## 2011-06-02 DIAGNOSIS — C3410 Malignant neoplasm of upper lobe, unspecified bronchus or lung: Secondary | ICD-10-CM

## 2011-06-02 DIAGNOSIS — G609 Hereditary and idiopathic neuropathy, unspecified: Secondary | ICD-10-CM

## 2011-06-02 LAB — COMPREHENSIVE METABOLIC PANEL
Albumin: 4.4 g/dL (ref 3.5–5.2)
BUN: 21 mg/dL (ref 6–23)
Calcium: 9.3 mg/dL (ref 8.4–10.5)
Chloride: 108 mEq/L (ref 96–112)
Glucose, Bld: 90 mg/dL (ref 70–99)
Potassium: 4.1 mEq/L (ref 3.5–5.3)

## 2011-06-02 LAB — CBC WITH DIFFERENTIAL/PLATELET
Basophils Absolute: 0 10*3/uL (ref 0.0–0.1)
EOS%: 0.9 % (ref 0.0–7.0)
Eosinophils Absolute: 0 10*3/uL (ref 0.0–0.5)
HGB: 10.5 g/dL — ABNORMAL LOW (ref 11.6–15.9)
MONO#: 0.8 10*3/uL (ref 0.1–0.9)
NEUT#: 3.3 10*3/uL (ref 1.5–6.5)
RDW: 13.3 % (ref 11.2–14.5)
WBC: 5 10*3/uL (ref 3.9–10.3)
lymph#: 0.9 10*3/uL (ref 0.9–3.3)

## 2011-06-02 LAB — PROTIME-INR: INR: 1.9 — ABNORMAL LOW (ref 2.00–3.50)

## 2011-06-03 ENCOUNTER — Encounter (HOSPITAL_BASED_OUTPATIENT_CLINIC_OR_DEPARTMENT_OTHER): Payer: Medicare Other | Admitting: Oncology

## 2011-06-03 DIAGNOSIS — C3410 Malignant neoplasm of upper lobe, unspecified bronchus or lung: Secondary | ICD-10-CM

## 2011-06-09 ENCOUNTER — Other Ambulatory Visit (HOSPITAL_COMMUNITY): Payer: Self-pay | Admitting: Oncology

## 2011-06-09 ENCOUNTER — Encounter (HOSPITAL_BASED_OUTPATIENT_CLINIC_OR_DEPARTMENT_OTHER): Payer: Medicare Other | Admitting: Oncology

## 2011-06-09 DIAGNOSIS — J90 Pleural effusion, not elsewhere classified: Secondary | ICD-10-CM

## 2011-06-09 DIAGNOSIS — Z5111 Encounter for antineoplastic chemotherapy: Secondary | ICD-10-CM

## 2011-06-09 DIAGNOSIS — Z7901 Long term (current) use of anticoagulants: Secondary | ICD-10-CM

## 2011-06-09 DIAGNOSIS — C3410 Malignant neoplasm of upper lobe, unspecified bronchus or lung: Secondary | ICD-10-CM

## 2011-06-09 LAB — CBC WITH DIFFERENTIAL/PLATELET
BASO%: 0.1 % (ref 0.0–2.0)
Eosinophils Absolute: 0 10*3/uL (ref 0.0–0.5)
HCT: 27.4 % — ABNORMAL LOW (ref 34.8–46.6)
LYMPH%: 4.5 % — ABNORMAL LOW (ref 14.0–49.7)
MCHC: 34.2 g/dL (ref 31.5–36.0)
MONO#: 0.9 10*3/uL (ref 0.1–0.9)
NEUT#: 22.7 10*3/uL — ABNORMAL HIGH (ref 1.5–6.5)
NEUT%: 91.7 % — ABNORMAL HIGH (ref 38.4–76.8)
Platelets: 140 10*3/uL — ABNORMAL LOW (ref 145–400)
RBC: 2.67 10*6/uL — ABNORMAL LOW (ref 3.70–5.45)
WBC: 24.8 10*3/uL — ABNORMAL HIGH (ref 3.9–10.3)
lymph#: 1.1 10*3/uL (ref 0.9–3.3)

## 2011-06-09 LAB — PROTIME-INR
INR: 2 (ref 2.00–3.50)
Protime: 24 Seconds — ABNORMAL HIGH (ref 10.6–13.4)

## 2011-06-16 ENCOUNTER — Other Ambulatory Visit (HOSPITAL_COMMUNITY): Payer: Self-pay | Admitting: Oncology

## 2011-06-16 ENCOUNTER — Other Ambulatory Visit (HOSPITAL_BASED_OUTPATIENT_CLINIC_OR_DEPARTMENT_OTHER): Payer: Medicare Other | Admitting: Lab

## 2011-06-16 ENCOUNTER — Telehealth: Payer: Self-pay | Admitting: Medical Oncology

## 2011-06-16 DIAGNOSIS — J90 Pleural effusion, not elsewhere classified: Secondary | ICD-10-CM

## 2011-06-16 DIAGNOSIS — Z7901 Long term (current) use of anticoagulants: Secondary | ICD-10-CM

## 2011-06-16 DIAGNOSIS — Z5111 Encounter for antineoplastic chemotherapy: Secondary | ICD-10-CM

## 2011-06-16 DIAGNOSIS — C3410 Malignant neoplasm of upper lobe, unspecified bronchus or lung: Secondary | ICD-10-CM

## 2011-06-16 LAB — PROTIME-INR: Protime: 27.6 Seconds — ABNORMAL HIGH (ref 10.6–13.4)

## 2011-06-16 NOTE — Telephone Encounter (Signed)
Called pt to have her continue her 4 mg of coumadin daily. She has lab appointment next week.

## 2011-06-17 ENCOUNTER — Encounter: Payer: Self-pay | Admitting: Medical Oncology

## 2011-06-21 ENCOUNTER — Other Ambulatory Visit: Payer: Self-pay | Admitting: Oncology

## 2011-06-21 DIAGNOSIS — I4891 Unspecified atrial fibrillation: Secondary | ICD-10-CM

## 2011-06-21 DIAGNOSIS — C3490 Malignant neoplasm of unspecified part of unspecified bronchus or lung: Secondary | ICD-10-CM | POA: Insufficient documentation

## 2011-06-23 ENCOUNTER — Other Ambulatory Visit (HOSPITAL_BASED_OUTPATIENT_CLINIC_OR_DEPARTMENT_OTHER): Payer: Medicare Other | Admitting: Lab

## 2011-06-23 ENCOUNTER — Ambulatory Visit (HOSPITAL_BASED_OUTPATIENT_CLINIC_OR_DEPARTMENT_OTHER): Payer: Medicare Other | Admitting: Oncology

## 2011-06-23 ENCOUNTER — Other Ambulatory Visit (HOSPITAL_COMMUNITY): Payer: Self-pay | Admitting: Oncology

## 2011-06-23 ENCOUNTER — Ambulatory Visit (HOSPITAL_BASED_OUTPATIENT_CLINIC_OR_DEPARTMENT_OTHER): Payer: Medicare Other

## 2011-06-23 ENCOUNTER — Telehealth: Payer: Self-pay | Admitting: Oncology

## 2011-06-23 VITALS — BP 163/93 | HR 93 | Temp 97.1°F | Ht 64.5 in | Wt 129.5 lb

## 2011-06-23 DIAGNOSIS — Z7901 Long term (current) use of anticoagulants: Secondary | ICD-10-CM

## 2011-06-23 DIAGNOSIS — D696 Thrombocytopenia, unspecified: Secondary | ICD-10-CM

## 2011-06-23 DIAGNOSIS — I4891 Unspecified atrial fibrillation: Secondary | ICD-10-CM

## 2011-06-23 DIAGNOSIS — C3490 Malignant neoplasm of unspecified part of unspecified bronchus or lung: Secondary | ICD-10-CM

## 2011-06-23 DIAGNOSIS — C3410 Malignant neoplasm of upper lobe, unspecified bronchus or lung: Secondary | ICD-10-CM

## 2011-06-23 DIAGNOSIS — Z5111 Encounter for antineoplastic chemotherapy: Secondary | ICD-10-CM

## 2011-06-23 LAB — CBC WITH DIFFERENTIAL/PLATELET
Basophils Absolute: 0.1 10*3/uL (ref 0.0–0.1)
EOS%: 1.1 % (ref 0.0–7.0)
HCT: 29.8 % — ABNORMAL LOW (ref 34.8–46.6)
HGB: 10.1 g/dL — ABNORMAL LOW (ref 11.6–15.9)
LYMPH%: 13 % — ABNORMAL LOW (ref 14.0–49.7)
MCH: 35.3 pg — ABNORMAL HIGH (ref 25.1–34.0)
MCV: 104 fL — ABNORMAL HIGH (ref 79.5–101.0)
MONO%: 16.1 % — ABNORMAL HIGH (ref 0.0–14.0)
NEUT%: 68.5 % (ref 38.4–76.8)

## 2011-06-23 LAB — COMPREHENSIVE METABOLIC PANEL
CO2: 19 mEq/L (ref 19–32)
Calcium: 9.8 mg/dL (ref 8.4–10.5)
Chloride: 110 mEq/L (ref 96–112)
Creatinine, Ser: 1.61 mg/dL — ABNORMAL HIGH (ref 0.50–1.10)
Glucose, Bld: 93 mg/dL (ref 70–99)
Sodium: 142 mEq/L (ref 135–145)
Total Bilirubin: 0.3 mg/dL (ref 0.3–1.2)
Total Protein: 8.4 g/dL — ABNORMAL HIGH (ref 6.0–8.3)

## 2011-06-23 LAB — PROTIME-INR: INR: 2.2 (ref 2.00–3.50)

## 2011-06-23 MED ORDER — CARBOPLATIN CHEMO INJECTION 450 MG/45ML
200.0000 mg | Freq: Once | INTRAVENOUS | Status: AC
  Administered 2011-06-23: 200 mg via INTRAVENOUS
  Filled 2011-06-23: qty 20

## 2011-06-23 MED ORDER — SODIUM CHLORIDE 0.9 % IJ SOLN
10.0000 mL | INTRAMUSCULAR | Status: DC | PRN
  Administered 2011-06-23: 10 mL
  Filled 2011-06-23: qty 10

## 2011-06-23 MED ORDER — HEPARIN SOD (PORK) LOCK FLUSH 100 UNIT/ML IV SOLN
500.0000 [IU] | Freq: Once | INTRAVENOUS | Status: AC | PRN
  Administered 2011-06-23: 500 [IU]
  Filled 2011-06-23: qty 5

## 2011-06-23 MED ORDER — DEXAMETHASONE SODIUM PHOSPHATE 4 MG/ML IJ SOLN
20.0000 mg | Freq: Once | INTRAMUSCULAR | Status: AC
  Administered 2011-06-23: 20 mg via INTRAVENOUS

## 2011-06-23 MED ORDER — SODIUM CHLORIDE 0.9 % IV SOLN
Freq: Once | INTRAVENOUS | Status: AC
  Administered 2011-06-23: 15:00:00 via INTRAVENOUS

## 2011-06-23 MED ORDER — ONDANSETRON 16 MG/50ML IVPB (CHCC)
16.0000 mg | Freq: Once | INTRAVENOUS | Status: AC
  Administered 2011-06-23: 16 mg via INTRAVENOUS

## 2011-06-23 MED ORDER — SODIUM CHLORIDE 0.9 % IV SOLN
745.0000 mg/m2 | Freq: Once | INTRAVENOUS | Status: AC
  Administered 2011-06-23: 1216 mg via INTRAVENOUS
  Filled 2011-06-23: qty 32

## 2011-06-23 NOTE — Progress Notes (Signed)
CC:   Oneita Hurt, M.D. 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  The patient received combined chemotherapy radiation treatments beginning around mid June and concluded in early August.  In general, Madison [name] tolerated her treatments well.  She is here today with her husband, [name].  She was last seen by [place] on 06/02/2011.  She has been receiving consolidative chemotherapy consisting of carboplatin, gemcitabine with Neulasta on day 2.  These treatments started on 05/12/2011 and she has been receiving them every 3 weeks.  She has tolerated them extremely well without any significant problems.  In general, she is feeling well, regaining weight.  Madison [name] is also on Coumadin for atrial fibrillation, currently 4 mg daily.  Her prothrombin times have been in the therapeutic range.  All in all, Madison [name] is doing quite well at the present time and really is without any major complaints.  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  She has not had any recent falls.  MEDICINES:  Reviewed and recorded.  PHYSICAL EXAM:  Madison [name] certainly looks well.  Weight today is 129 pounds 8 ounces.  Height 5 feet 4-1/2 inches.  Body surface area 1.63 m squared.  HEENT:  There is no scleral icterus.  Mouth and pharynx are benign.  No peripheral adenopathy palpable.  Lungs:  Clear to percussion and auscultation.  Cardiac:  Regular rhythm with systolic ejection murmur.  Abdomen:  Benign.  The patient does have a right-sided Port-A- Cath.  Site looks fine.  Extremities:  No peripheral edema or clubbing. She uses a cane in her right hand.  LABORATORY DATA:  Today white count is 6.2, ANC 4.3, hemoglobin 10.1, hematocrit 29.8 and platelets 260,000.  Prothrombin time today was 26.4 with an INR  of 2.20.  Chemistries today are pending.  Chemistries from 06/02/2011 notable for a BUN of 21, creatinine 1.29.  Actually this is somewhat improved compared with the BUN of 39, creatinine 1.61 on 05/10/2011.  On 10/25, albumin was 4.4, liver function tests were normal.  IMPRESSION AND PLAN:  We will go ahead with another course of chemotherapy today with a dose of carboplatin modified to 200 mg because of some interval thrombocytopenia.  Gemcitabine dose will be 1200 mg. This is cycle #3.  The patient will return tomorrow for Neulasta 6 mg subcu.  On or about 11/26, the Monday after Thanksgiving, we will check CBC and prothrombin time.  We will plan to see Madison [name] again for her 4th cycle of chemotherapy on December 6 at which time we will check CBC, chemistries and prothrombin time.  It was originally my intention to get in about 4 treatments.  That will be cycle #4.  We will then reassess with a PET scan, and hopefully if the patient appears to be in a complete remission, we will stop her treatments and simply follow her.    ______________________________ Samul Dada, M.D. DSM/MEDQ  D:  06/23/2011  T:  06/23/2011  Job:  [id number]

## 2011-06-23 NOTE — Progress Notes (Signed)
This office note has been dictated.   #[id number]

## 2011-06-23 NOTE — Telephone Encounter (Signed)
gv pt appt schedule for nov/dec. Pt aware that chemo time for 12/6 may change. Marcelino Duster will adjust time if possible.

## 2011-06-24 ENCOUNTER — Ambulatory Visit (HOSPITAL_BASED_OUTPATIENT_CLINIC_OR_DEPARTMENT_OTHER): Payer: Medicare Other

## 2011-06-24 VITALS — BP 156/88 | HR 96 | Temp 98.2°F

## 2011-06-24 DIAGNOSIS — C3490 Malignant neoplasm of unspecified part of unspecified bronchus or lung: Secondary | ICD-10-CM

## 2011-06-24 DIAGNOSIS — C3410 Malignant neoplasm of upper lobe, unspecified bronchus or lung: Secondary | ICD-10-CM

## 2011-06-24 MED ORDER — PEGFILGRASTIM INJECTION 6 MG/0.6ML
6.0000 mg | Freq: Once | SUBCUTANEOUS | Status: AC
  Administered 2011-06-24: 6 mg via SUBCUTANEOUS
  Filled 2011-06-24: qty 0.6

## 2011-07-01 ENCOUNTER — Other Ambulatory Visit: Payer: Medicare Other | Admitting: Lab

## 2011-07-01 ENCOUNTER — Ambulatory Visit: Payer: Medicare Other

## 2011-07-04 ENCOUNTER — Telehealth: Payer: Self-pay | Admitting: Medical Oncology

## 2011-07-04 ENCOUNTER — Other Ambulatory Visit (HOSPITAL_BASED_OUTPATIENT_CLINIC_OR_DEPARTMENT_OTHER): Payer: Medicare Other | Admitting: Lab

## 2011-07-04 ENCOUNTER — Other Ambulatory Visit (HOSPITAL_COMMUNITY): Payer: Self-pay | Admitting: Oncology

## 2011-07-04 DIAGNOSIS — Z7901 Long term (current) use of anticoagulants: Secondary | ICD-10-CM

## 2011-07-04 DIAGNOSIS — C3410 Malignant neoplasm of upper lobe, unspecified bronchus or lung: Secondary | ICD-10-CM

## 2011-07-04 DIAGNOSIS — C3490 Malignant neoplasm of unspecified part of unspecified bronchus or lung: Secondary | ICD-10-CM

## 2011-07-04 DIAGNOSIS — I4891 Unspecified atrial fibrillation: Secondary | ICD-10-CM

## 2011-07-04 LAB — CBC WITH DIFFERENTIAL/PLATELET
BASO%: 0.5 % (ref 0.0–2.0)
HCT: 25.4 % — ABNORMAL LOW (ref 34.8–46.6)
MCHC: 33.9 g/dL (ref 31.5–36.0)
MONO#: 0.9 10*3/uL (ref 0.1–0.9)
NEUT#: 9.1 10*3/uL — ABNORMAL HIGH (ref 1.5–6.5)
NEUT%: 80.9 % — ABNORMAL HIGH (ref 38.4–76.8)
WBC: 11.3 10*3/uL — ABNORMAL HIGH (ref 3.9–10.3)
lymph#: 1.1 10*3/uL (ref 0.9–3.3)
nRBC: 0 % (ref 0–0)

## 2011-07-04 LAB — PROTIME-INR

## 2011-07-04 NOTE — Telephone Encounter (Signed)
I called pt per Robyn Hayes,NP to  Have her continue her same coumadin dose  4 mg. She voiced understanding.

## 2011-07-08 ENCOUNTER — Other Ambulatory Visit: Payer: Medicare Other | Admitting: Lab

## 2011-07-13 ENCOUNTER — Other Ambulatory Visit: Payer: Self-pay | Admitting: Oncology

## 2011-07-14 ENCOUNTER — Ambulatory Visit (HOSPITAL_BASED_OUTPATIENT_CLINIC_OR_DEPARTMENT_OTHER): Payer: Medicare Other

## 2011-07-14 ENCOUNTER — Other Ambulatory Visit (HOSPITAL_COMMUNITY): Payer: Self-pay | Admitting: Oncology

## 2011-07-14 ENCOUNTER — Other Ambulatory Visit: Payer: Medicare Other | Admitting: Lab

## 2011-07-14 ENCOUNTER — Other Ambulatory Visit: Payer: Self-pay | Admitting: Medical Oncology

## 2011-07-14 ENCOUNTER — Other Ambulatory Visit (HOSPITAL_BASED_OUTPATIENT_CLINIC_OR_DEPARTMENT_OTHER): Payer: Medicare Other | Admitting: Lab

## 2011-07-14 ENCOUNTER — Ambulatory Visit (HOSPITAL_BASED_OUTPATIENT_CLINIC_OR_DEPARTMENT_OTHER): Payer: Medicare Other | Admitting: Oncology

## 2011-07-14 DIAGNOSIS — I10 Essential (primary) hypertension: Secondary | ICD-10-CM

## 2011-07-14 DIAGNOSIS — C3490 Malignant neoplasm of unspecified part of unspecified bronchus or lung: Secondary | ICD-10-CM

## 2011-07-14 DIAGNOSIS — I4891 Unspecified atrial fibrillation: Secondary | ICD-10-CM

## 2011-07-14 DIAGNOSIS — C3410 Malignant neoplasm of upper lobe, unspecified bronchus or lung: Secondary | ICD-10-CM

## 2011-07-14 DIAGNOSIS — Z5111 Encounter for antineoplastic chemotherapy: Secondary | ICD-10-CM

## 2011-07-14 LAB — COMPREHENSIVE METABOLIC PANEL
Alkaline Phosphatase: 114 U/L (ref 39–117)
BUN: 43 mg/dL — ABNORMAL HIGH (ref 6–23)
Creatinine, Ser: 1.66 mg/dL — ABNORMAL HIGH (ref 0.50–1.10)
Glucose, Bld: 96 mg/dL (ref 70–99)
Total Bilirubin: 0.6 mg/dL (ref 0.3–1.2)

## 2011-07-14 LAB — PROTIME-INR: INR: 3 (ref 2.00–3.50)

## 2011-07-14 LAB — CBC WITH DIFFERENTIAL/PLATELET
Basophils Absolute: 0 10*3/uL (ref 0.0–0.1)
Eosinophils Absolute: 0.1 10*3/uL (ref 0.0–0.5)
HGB: 9.1 g/dL — ABNORMAL LOW (ref 11.6–15.9)
MCV: 105 fL — ABNORMAL HIGH (ref 79.5–101.0)
NEUT#: 3.2 10*3/uL (ref 1.5–6.5)
RDW: 18.9 % — ABNORMAL HIGH (ref 11.2–14.5)
lymph#: 1 10*3/uL (ref 0.9–3.3)

## 2011-07-14 MED ORDER — SODIUM CHLORIDE 0.9 % IV SOLN
150.0000 mg | Freq: Once | INTRAVENOUS | Status: AC
  Administered 2011-07-14: 150 mg via INTRAVENOUS
  Filled 2011-07-14: qty 15

## 2011-07-14 MED ORDER — SODIUM CHLORIDE 0.9 % IV SOLN
630.0000 mg/m2 | Freq: Once | INTRAVENOUS | Status: AC
  Administered 2011-07-14: 1026 mg via INTRAVENOUS
  Filled 2011-07-14: qty 27

## 2011-07-14 MED ORDER — SODIUM CHLORIDE 0.9 % IJ SOLN
10.0000 mL | INTRAMUSCULAR | Status: DC | PRN
  Administered 2011-07-14: 10 mL
  Filled 2011-07-14: qty 10

## 2011-07-14 MED ORDER — ONDANSETRON 16 MG/50ML IVPB (CHCC)
16.0000 mg | Freq: Once | INTRAVENOUS | Status: AC
  Administered 2011-07-14: 16 mg via INTRAVENOUS

## 2011-07-14 MED ORDER — GABAPENTIN 100 MG PO CAPS: 100.0000 mg | ORAL_CAPSULE | Freq: Three times a day (TID) | ORAL | Status: DC

## 2011-07-14 MED ORDER — HEPARIN SOD (PORK) LOCK FLUSH 100 UNIT/ML IV SOLN
500.0000 [IU] | Freq: Once | INTRAVENOUS | Status: AC | PRN
  Administered 2011-07-14: 500 [IU]
  Filled 2011-07-14: qty 5

## 2011-07-14 MED ORDER — SODIUM CHLORIDE 0.9 % IV SOLN
Freq: Once | INTRAVENOUS | Status: AC
  Administered 2011-07-14: 12:00:00 via INTRAVENOUS

## 2011-07-14 MED ORDER — DEXAMETHASONE SODIUM PHOSPHATE 4 MG/ML IJ SOLN
20.0000 mg | Freq: Once | INTRAMUSCULAR | Status: AC
  Administered 2011-07-14: 20 mg via INTRAVENOUS

## 2011-07-14 NOTE — Progress Notes (Signed)
Dict  [phone]

## 2011-07-14 NOTE — Progress Notes (Signed)
CC:   Oneita Hurt, M.D. 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  The patient is currently on consolidative chemotherapy following chemotherapy and radiation treatments.  She is doing quite well.  She continues to have peripheral neuropathy.  She was on Neurontin 300 mg twice a day.  However, it is not clear whether this was helping her.  She has asked for a refill as she has run out.  The patient has tolerated her chemotherapy treatments well.  These treatments with carboplatin, gemcitabine, and Neulasta were started on October 4th.  The patient is due for her last planned treatment today.  She is here today with her husband, [name].  She denies any difficulty breathing, any changes in her condition.  PROBLEM LIST:  Read as follows: 1. Squamous cell carcinoma of the medial right upper lobe, stage IIIA,     diagnosed on 11/11/2010 by fiberoptic bronchoscopy.  The patient     had a right pleural effusion at that time that was cytologically     negative.  She received radiation treatments under the direction of     Dr. Margaretmary Dys, 6600 cGy in 33 fractions from January 27, 2011     through March 15, 2011, in combination with weekly carboplatin and     Taxotere.  She was started on consolidative treatment with reduced     doses of carboplatin, gemcitabine, and Neulasta on May 12, 2011.     She has had significant thrombocytopenia not requiring transfusions     and is here today for cycle #4 of these treatments, which is     anticipated to be her final treatment. 2. Atrial fibrillation. 3. Peripheral neuropathy of feet and hands since 1998. 4. Hypertension. 5. Incontinence of urine. 6. History of shock, diarrhea, metabolic acidosis, and renal failure,     requiring admission to the  ICU in April 2012. 7. Left foot drop.  MEDICINES:  Reviewed and recorded.  Include vitamin D, Cardizem, folic acid, Niferex, EMLA cream, Imodium, Claritin, Zofran as needed, Compazine as needed, and Coumadin 4 mg daily.  The patient had been on Neurontin 300 mg twice a day.  We will plan to refill this.  PHYSICAL EXAM:  General: Mrs. Eppes It is also looks well.  Vital signs: Weight is 127 pounds which continues to improve.  The patient's baseline weight prior to illness was 136 pounds, height 5 feet 4-1/2 inches, body surface area 1.62 m squared.  Blood pressure today 147/88 in the left arm sitting.  Other vital signs are normal.  She is afebrile.  There is no scleral icterus.  Mouth and pharynx are benign. No peripheral adenopathy palpable.  Heart and lungs are normal.  Rhythm is regular.  I did not appreciate a systolic ejection murmur today. However, I have heard this from previously.  The patient may have mitral valve prolapse.  She has a right-sided Port-A-Cath.  Abdomen: With the patient sitting is benign.  Extremities: No peripheral edema or clubbing.  She has had a left foot drop in the past.  She uses a cane in her right hand.  LABORATORY DATA:  Today, white count 5.3, ANC 3.2, hemoglobin 9.1, hematocrit 27.5, and platelets 192,000.  On 11/26, platelet count was 38,000 and on the day of her last treatment, on  11/15, when the patient was last seen by [place], her platelet count was 260,000, white count 6.2, ANC 4.3, hemoglobin 10.1, and hematocrit 29.8.  Chemistries today are pending.  Chemistries from 06/23/2011 were notable for BUN of 42, creatinine 1.61.  Alkaline phosphatase 133.  Pro-time today was 36.0 with an INR of 3.00.  Pro-time on 11/26 was 27.6 with an INR of 2.30, and the Pro-time on 11/15 was 26.4 with an INR of 2.20.  The patient's last CEA was 1.1 on 04/22/2011.  IMAGING DATA:  Last CT scan of the chest without IV contrast was on 04/19/2011.  IMPRESSION AND  PLAN:  We will go ahead with 4th and final course of chemotherapy today.  We are going to reduce the doses of both carboplatin and gemcitabine because of the nadir platelet count of 38,000 on 11/26.  The carboplatin dose will be decreased from 200 mg to 150 mg and the gemcitabine dose will be decreased from 1200 mg to 1000 mg.  At the patient's request, we will omit Neulasta tomorrow.  We have being giving this with the other cycles.  We will check CBC and Pro-times approximately every 10 days, which will be December 17th and December 27th.  We will plan to see Mrs. Province again on or about January 8th at which time we will check CBC, chemistries, Pro-time, and CEA.  As stated, our plan is to stop after the 4 cycles.  At some point, we will want to reassess the patient with a PET scan.  We will need to remember to continue to maintain the Port-A-Cath with heparin flushes every 2 months.    ______________________________ Samul Dada, M.D. DSM/MEDQ  D:  07/14/2011  T:  07/14/2011  Job:  [id number]

## 2011-07-15 ENCOUNTER — Ambulatory Visit: Payer: Medicare Other

## 2011-07-25 ENCOUNTER — Other Ambulatory Visit (HOSPITAL_BASED_OUTPATIENT_CLINIC_OR_DEPARTMENT_OTHER): Payer: Medicare Other

## 2011-07-25 DIAGNOSIS — I4891 Unspecified atrial fibrillation: Secondary | ICD-10-CM

## 2011-07-25 DIAGNOSIS — C3490 Malignant neoplasm of unspecified part of unspecified bronchus or lung: Secondary | ICD-10-CM

## 2011-07-25 LAB — CBC WITH DIFFERENTIAL/PLATELET
Eosinophils Absolute: 0 10*3/uL (ref 0.0–0.5)
HCT: 22.3 % — ABNORMAL LOW (ref 34.8–46.6)
HGB: 7.4 g/dL — ABNORMAL LOW (ref 11.6–15.9)
LYMPH%: 17.3 % (ref 14.0–49.7)
MONO#: 0.4 10*3/uL (ref 0.1–0.9)
NEUT#: 1.9 10*3/uL (ref 1.5–6.5)
Platelets: 106 10*3/uL — ABNORMAL LOW (ref 145–400)
RBC: 2.11 10*6/uL — ABNORMAL LOW (ref 3.70–5.45)
WBC: 2.8 10*3/uL — ABNORMAL LOW (ref 3.9–10.3)
nRBC: 1 % — ABNORMAL HIGH (ref 0–0)

## 2011-07-25 LAB — PROTIME-INR

## 2011-08-01 ENCOUNTER — Telehealth: Payer: Self-pay | Admitting: Medical Oncology

## 2011-08-01 NOTE — Telephone Encounter (Signed)
I called pt per Dr. Arline Asp to see how she is feeling.07/25/11 her HGB was 7.4. She states that she is feeling fine, no shortness of breath or tiredness. I will check with Dr. Arline Asp to see if he would like to check labs sooner than 08/15/10.

## 2011-08-01 NOTE — Telephone Encounter (Signed)
I called pt to let her know that she has an appointment for labs 08/04/11 at 1230pm. She states she did not have this one written down.  She voiced understanding.

## 2011-08-03 ENCOUNTER — Other Ambulatory Visit (HOSPITAL_COMMUNITY): Payer: Self-pay | Admitting: Oncology

## 2011-08-03 DIAGNOSIS — G609 Hereditary and idiopathic neuropathy, unspecified: Secondary | ICD-10-CM

## 2011-08-03 DIAGNOSIS — C3490 Malignant neoplasm of unspecified part of unspecified bronchus or lung: Secondary | ICD-10-CM

## 2011-08-03 DIAGNOSIS — Z7901 Long term (current) use of anticoagulants: Secondary | ICD-10-CM

## 2011-08-04 ENCOUNTER — Other Ambulatory Visit (HOSPITAL_BASED_OUTPATIENT_CLINIC_OR_DEPARTMENT_OTHER): Payer: Medicare Other

## 2011-08-04 ENCOUNTER — Ambulatory Visit: Payer: Medicare Other

## 2011-08-04 ENCOUNTER — Telehealth: Payer: Self-pay | Admitting: Medical Oncology

## 2011-08-04 DIAGNOSIS — I4891 Unspecified atrial fibrillation: Secondary | ICD-10-CM

## 2011-08-04 DIAGNOSIS — C3490 Malignant neoplasm of unspecified part of unspecified bronchus or lung: Secondary | ICD-10-CM

## 2011-08-04 LAB — PROTIME-INR
INR: 1.9 — ABNORMAL LOW (ref 2.00–3.50)
Protime: 22.8 Seconds — ABNORMAL HIGH (ref 10.6–13.4)

## 2011-08-04 LAB — CBC WITH DIFFERENTIAL/PLATELET
BASO%: 1.3 % (ref 0.0–2.0)
HCT: 23 % — ABNORMAL LOW (ref 34.8–46.6)
MCHC: 33.5 g/dL (ref 31.5–36.0)
MONO#: 0.7 10*3/uL (ref 0.1–0.9)
NEUT%: 49.8 % (ref 38.4–76.8)
RBC: 2.09 10*6/uL — ABNORMAL LOW (ref 3.70–5.45)
WBC: 3.1 10*3/uL — ABNORMAL LOW (ref 3.9–10.3)
lymph#: 0.8 10*3/uL — ABNORMAL LOW (ref 0.9–3.3)

## 2011-08-04 NOTE — Telephone Encounter (Signed)
I called pt per Dr. Arline Asp to have her continue 4 mg coumadin daily. She has lab/MD 08/16/11. She voiced understanding.

## 2011-08-05 ENCOUNTER — Ambulatory Visit: Payer: Medicare Other

## 2011-08-16 ENCOUNTER — Other Ambulatory Visit (HOSPITAL_COMMUNITY): Payer: Self-pay | Admitting: Oncology

## 2011-08-16 ENCOUNTER — Ambulatory Visit (HOSPITAL_BASED_OUTPATIENT_CLINIC_OR_DEPARTMENT_OTHER): Payer: Medicare Other | Admitting: Oncology

## 2011-08-16 ENCOUNTER — Other Ambulatory Visit: Payer: Medicare Other | Admitting: Lab

## 2011-08-16 ENCOUNTER — Encounter: Payer: Self-pay | Admitting: Oncology

## 2011-08-16 ENCOUNTER — Telehealth: Payer: Self-pay | Admitting: Oncology

## 2011-08-16 DIAGNOSIS — Z5181 Encounter for therapeutic drug level monitoring: Secondary | ICD-10-CM

## 2011-08-16 DIAGNOSIS — C3490 Malignant neoplasm of unspecified part of unspecified bronchus or lung: Secondary | ICD-10-CM

## 2011-08-16 DIAGNOSIS — D649 Anemia, unspecified: Secondary | ICD-10-CM

## 2011-08-16 DIAGNOSIS — N289 Disorder of kidney and ureter, unspecified: Secondary | ICD-10-CM

## 2011-08-16 DIAGNOSIS — I4891 Unspecified atrial fibrillation: Secondary | ICD-10-CM

## 2011-08-16 DIAGNOSIS — Z7901 Long term (current) use of anticoagulants: Secondary | ICD-10-CM

## 2011-08-16 DIAGNOSIS — D539 Nutritional anemia, unspecified: Secondary | ICD-10-CM

## 2011-08-16 DIAGNOSIS — I480 Paroxysmal atrial fibrillation: Secondary | ICD-10-CM | POA: Insufficient documentation

## 2011-08-16 LAB — CBC WITH DIFFERENTIAL/PLATELET
BASO%: 0.5 % (ref 0.0–2.0)
MCHC: 33.7 g/dL (ref 31.5–36.0)
MONO#: 0.7 10*3/uL (ref 0.1–0.9)
RBC: 2.44 10*6/uL — ABNORMAL LOW (ref 3.70–5.45)
RDW: 18 % — ABNORMAL HIGH (ref 11.2–14.5)
WBC: 4.8 10*3/uL (ref 3.9–10.3)
lymph#: 0.7 10*3/uL — ABNORMAL LOW (ref 0.9–3.3)

## 2011-08-16 LAB — COMPREHENSIVE METABOLIC PANEL
ALT: 15 U/L (ref 0–35)
AST: 24 U/L (ref 0–37)
Alkaline Phosphatase: 84 U/L (ref 39–117)
BUN: 20 mg/dL (ref 6–23)
Calcium: 8.5 mg/dL (ref 8.4–10.5)
Chloride: 108 mEq/L (ref 96–112)
Creatinine, Ser: 1.45 mg/dL — ABNORMAL HIGH (ref 0.50–1.10)

## 2011-08-16 LAB — PROTIME-INR: INR: 1.9 — ABNORMAL LOW (ref 2.00–3.50)

## 2011-08-16 LAB — CEA: CEA: <0.5 ng/mL (ref 0.0–5.0)

## 2011-08-16 NOTE — Progress Notes (Signed)
CC:   Oneita Hurt, M.D. 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  Madison [name] is here today with her husband.  She was last seen by [place] on 07/14/2011 at the time of her 4th and final treatment of consolidative chemotherapy.  On 07/14/2011 she received carboplatin 150 mg and gemcitabine 1000 mg.  Doses were reduced because of previous thrombocytopenia, specifically a platelet count of 38,000 that occurred on 07/04/2011 following the patient's 3rd cycle of chemotherapy.  Neulasta was not given after the 4th cycle.  The patient is really without any complaints today.  She seems to be doing extraordinarily well.  She denies any respiratory or GI problems. Her neuropathy is stable.  She remains on Coumadin for atrial fibrillation.  She is eating well and gaining weight.  She denies any pain.  Her energy is good.   PROBLEM LIST.:   1. 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 The patient  had a right pleural effusion at that time that was cytologically  negative. She received radiation treatments under the direction of  Dr. Margaretmary Dys, 6600 cGy in 33 fractions from January 27, 2011  through March 15, 2011, in combination with weekly carboplatin and  Taxotere. She was started on consolidative treatment with reduced  doses of carboplatin, gemcitabine, and Neulasta on May 12, 2011.  She has had significant thrombocytopenia not requiring transfusions.  Patient completed 4 cycles of consolidative chemotherapy, completed on 07/14/11. Neulasta was omitted from cycle 4. 2. Atrial fibrillation.  3. Peripheral neuropathy of feet and hands since 1998.  4. Hypertension.  5. Incontinence of urine.  6. History of shock,  diarrhea, metabolic acidosis, and renal failure,  requiring admission to the ICU in April 2012.  7. Left foot drop. 8. Anemia. 9. Renal insufficiency. 10.Right port-a-cath placed 01/20/11.  MEDICATIONS: 1. Vitamin D 1000 units. 2. Daily Cardizem CD 180 mg daily. 3. Folic acid 1 mg daily. 4. EMLA cream. 5. Claritin 10 mg daily. 6. Poly-Iron 150 mg twice a day. 7. Thiamine 100 mg daily. 8. Coumadin 4 mg daily. 9. The patient has Compazine, Zofran and Imodium to use as needed. Medicines were reviewed and recorded.  The patient informs me that she stopped taking Neurontin recently.  PHYSICAL EXAMINATION:  The patient looks well.  Weight is 133 pounds, up from 127 pounds on 07/14/2011.  Height 5 feet 4-1/2 inches.  Body surface area 1.66 sq. m.  Blood pressure 151/91 in the left arm.  The patient does have a history of hypertension.  Pulse 107 and regular. Respirations regular and unlabored.  She is afebrile.  There is no scleral icterus.  Mouth and pharynx are benign.  No peripheral adenopathy palpable.  Heart and lungs were normal.  Rhythm is regular. Once again, I did not appreciate a systolic ejection murmur which I have heard in the past.  The patient tells me she has mitral valve prolapse. The patient has a right-sided Port-A-Cath.  That will need to be flushed with heparin on her next visit in February.  Abdomen with the patient sitting is benign with no organomegaly or masses palpable.  Extremities: No peripheral edema or clubbing.  The patient has had a left foot drop in the past.  She uses  her right cane.  O2 saturation was 100%.  LABORATORY DATA:  White count 4.8, hemoglobin 9.4, hematocrit 28.0, platelets 331,000.  MCV and MCH are markedly elevated, 114.7 and 38.7, respectively.  Pro time today is 22.8 with an INR of 1.90 with the patient on 4 mg of Coumadin.  Pro times have been fairly consistent. Pro time was last checked on 08/04/2011, was also 22.8 with an INR  of 1.90.  Chemistries from 07/14/2011 notable for BUN of 43, creatinine 1.66.  The patient does have renal insufficiency.  Albumin 4.5.  Liver function tests were normal.  CEA today is pending.  CEA on 04/22/2011 was 1.1.  Anemia workup was done during the patient's hospitalization on 11/11/2010.  Iron saturation was 31%, ferritin 824, folate 12.1, and vitamin B12 level was 1338 back on 11/11/2010.  IMAGING STUDIES:  CT scan of the chest from 04/19/2011 showed improvement compared with the chest CT of 11/10/2010 and PET scan of 12/30/2010.  IMPRESSION/PLAN:  Madison [name] is doing quite well at the present time, now 9 months from the time of diagnosis.  The patient has completed her chemotherapy/radiation treatments.  She is under observation with no treatment at the present time.  We will check a pro time in 3 weeks, which will be around January 29th. We will plan to see Madison [name] again in approximately 5 weeks, which will be February 12th, at which time we will check CBC, chemistries, pro time, iron studies, red cell folate and vitamin B12 level.  The patient will also be due for a Port-A-Cath flush with heparin at that time since her port was last flushed at that time of her chemotherapy on 12/06.  We are also planning to carry out a PET scan either in February or March.    ______________________________ Samul Dada, M.D. DSM/MEDQ  D:  08/16/2011  T:  08/16/2011  Job:  [id number]

## 2011-08-16 NOTE — Telephone Encounter (Signed)
gve the pt her jan,feb 2013 appt calendar °

## 2011-08-16 NOTE — Progress Notes (Signed)
This office note has been dictated.   #[id number]

## 2011-09-06 ENCOUNTER — Ambulatory Visit (HOSPITAL_BASED_OUTPATIENT_CLINIC_OR_DEPARTMENT_OTHER): Payer: Medicare Other

## 2011-09-06 ENCOUNTER — Other Ambulatory Visit (HOSPITAL_BASED_OUTPATIENT_CLINIC_OR_DEPARTMENT_OTHER): Payer: Medicare Other | Admitting: Lab

## 2011-09-06 DIAGNOSIS — C3490 Malignant neoplasm of unspecified part of unspecified bronchus or lung: Secondary | ICD-10-CM

## 2011-09-06 DIAGNOSIS — I4891 Unspecified atrial fibrillation: Secondary | ICD-10-CM

## 2011-09-06 DIAGNOSIS — Z469 Encounter for fitting and adjustment of unspecified device: Secondary | ICD-10-CM

## 2011-09-06 LAB — PROTIME-INR
INR: 3 (ref 2.00–3.50)
Protime: 36 Seconds — ABNORMAL HIGH (ref 10.6–13.4)

## 2011-09-06 MED ORDER — HEPARIN SOD (PORK) LOCK FLUSH 100 UNIT/ML IV SOLN
500.0000 [IU] | Freq: Once | INTRAVENOUS | Status: AC
  Administered 2011-09-06: 500 [IU] via INTRAVENOUS
  Filled 2011-09-06: qty 5

## 2011-09-06 MED ORDER — SODIUM CHLORIDE 0.9 % IJ SOLN
10.0000 mL | INTRAMUSCULAR | Status: DC | PRN
  Administered 2011-09-06: 10 mL via INTRAVENOUS
  Filled 2011-09-06: qty 10

## 2011-09-15 ENCOUNTER — Other Ambulatory Visit (HOSPITAL_COMMUNITY): Payer: Self-pay | Admitting: Oncology

## 2011-09-15 DIAGNOSIS — C3490 Malignant neoplasm of unspecified part of unspecified bronchus or lung: Secondary | ICD-10-CM

## 2011-09-15 DIAGNOSIS — D539 Nutritional anemia, unspecified: Secondary | ICD-10-CM

## 2011-09-15 DIAGNOSIS — D649 Anemia, unspecified: Secondary | ICD-10-CM

## 2011-09-15 DIAGNOSIS — I4891 Unspecified atrial fibrillation: Secondary | ICD-10-CM

## 2011-09-15 DIAGNOSIS — N289 Disorder of kidney and ureter, unspecified: Secondary | ICD-10-CM

## 2011-09-22 ENCOUNTER — Ambulatory Visit (HOSPITAL_BASED_OUTPATIENT_CLINIC_OR_DEPARTMENT_OTHER): Payer: Medicare Other | Admitting: Physician Assistant

## 2011-09-22 ENCOUNTER — Telehealth: Payer: Self-pay | Admitting: Oncology

## 2011-09-22 ENCOUNTER — Other Ambulatory Visit (HOSPITAL_BASED_OUTPATIENT_CLINIC_OR_DEPARTMENT_OTHER): Payer: Medicare Other | Admitting: Lab

## 2011-09-22 DIAGNOSIS — D649 Anemia, unspecified: Secondary | ICD-10-CM

## 2011-09-22 DIAGNOSIS — C3410 Malignant neoplasm of upper lobe, unspecified bronchus or lung: Secondary | ICD-10-CM

## 2011-09-22 DIAGNOSIS — N289 Disorder of kidney and ureter, unspecified: Secondary | ICD-10-CM

## 2011-09-22 DIAGNOSIS — D6481 Anemia due to antineoplastic chemotherapy: Secondary | ICD-10-CM

## 2011-09-22 DIAGNOSIS — I4891 Unspecified atrial fibrillation: Secondary | ICD-10-CM

## 2011-09-22 DIAGNOSIS — C3490 Malignant neoplasm of unspecified part of unspecified bronchus or lung: Secondary | ICD-10-CM

## 2011-09-22 DIAGNOSIS — D539 Nutritional anemia, unspecified: Secondary | ICD-10-CM

## 2011-09-22 DIAGNOSIS — Z7901 Long term (current) use of anticoagulants: Secondary | ICD-10-CM

## 2011-09-22 LAB — CBC WITH DIFFERENTIAL/PLATELET
BASO%: 0.8 % (ref 0.0–2.0)
EOS%: 3.4 % (ref 0.0–7.0)
HCT: 36 % (ref 34.8–46.6)
MCH: 36.8 pg — ABNORMAL HIGH (ref 25.1–34.0)
MCHC: 34.1 g/dL (ref 31.5–36.0)
MONO#: 0.4 10*3/uL (ref 0.1–0.9)
NEUT%: 33.1 % — ABNORMAL LOW (ref 38.4–76.8)
RBC: 3.33 10*6/uL — ABNORMAL LOW (ref 3.70–5.45)
WBC: 2 10*3/uL — ABNORMAL LOW (ref 3.9–10.3)
lymph#: 0.9 10*3/uL (ref 0.9–3.3)

## 2011-09-22 LAB — PROTIME-INR: INR: 3.7 — ABNORMAL HIGH (ref 2.00–3.50)

## 2011-09-22 NOTE — Telephone Encounter (Signed)
appt made and printed for 3/26 aom 

## 2011-09-22 NOTE — Progress Notes (Signed)
[facility] OFFICE PROGRESS NOTE CC: Oneita Hurt, M.D.  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 Ms. Clapham is here today with her husband. She was last seen by [place] on 08/16/2011, one month after her  4th and final treatment of consolidative chemotherapy. On 07/14/2011 she received carboplatin 150 mg and gemcitabine 1000 mg. Doses were reduced because of previous thrombocytopenia, specifically a platelet count of 38,000 that occurred on 07/04/2011 following the patient's 3rd cycle of chemotherapy. Neulasta was not given after the 4th cycle. The patient is really without any complaints today. She seems to be doing extraordinarily well. She denies any respiratory or GI problems. Her neuropathy is stable.She still uses a cane to ambulate for better stability. She remains on Coumadin for atrial fibrillation. She is eating well and gaining weight. She denies any pain. Her energy is good.Her last CEA on January 8th was less than 0.5.  MEDICAL HISTORY: 1. 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 The patient had a right pleural effusion at that time that was cytologically negative. She received radiation treatments under the direction of  Dr. Margaretmary Dys, 6600 cGy in 33 fractions from January 27, 2011 through March 15, 2011, in combination with weekly carboplatin and Taxotere. She was started on consolidative treatment with reduced doses of carboplatin, gemcitabine, and Neulasta on May 12, 2011. She has had significant thrombocytopenia not requiring transfusions. Patient completed 4 cycles of consolidative chemotherapy, completed on 07/14/11. Neulasta was omitted from cycle 4.  2. Atrial fibrillation.  3. Peripheral  neuropathy of feet and hands since 1998.  4. Hypertension.  5. Incontinence of urine.  6. History of shock, diarrhea, metabolic acidosis, and renal failure,  requiring admission to the ICU in April 2012.  7. Left foot drop.  8. Anemia.  9. Renal insufficiency.  10.Right port-a-cath placed 01/20/11.     MEDICATIONS: Current Outpatient Prescriptions  Medication Sig Dispense Refill  . cholecalciferol (VITAMIN D) 1000 UNITS tablet Take 1,000 Units by mouth daily.        Marland Kitchen diltiazem (CARDIZEM CD) 180 MG 24 hr capsule Take 180 mg by mouth daily.        . folic acid (FOLVITE) 1 MG tablet Take 1 mg by mouth daily.        Marland Kitchen lidocaine-prilocaine (EMLA) cream Apply topically once.        . loperamide (IMODIUM A-D) 2 MG tablet Take 2 mg by mouth 4 (four) times daily as needed.        . loratadine (CLARITIN) 10 MG tablet Take 10 mg by mouth daily.        . ondansetron (ZOFRAN) 8 MG tablet Take by mouth every 8 (eight) hours as needed.        Marland Kitchen POLY-IRON 150 150 MG capsule TAKE ONE CAPSULE BY MOUTH TWICE DAILY  60 capsule  1  . prochlorperazine (COMPAZINE) 10 MG tablet Take 10 mg by mouth every 6 (six) hours as needed.        . thiamine (VITAMIN B-1) 100 MG tablet TAKE  1/2 TABLET BY MOUTH EVERY DAY  15 tablet  1  . warfarin (COUMADIN) 4 MG tablet TAKE ONE TABLET BY MOUTH DAILY OR AS DIRECTED  30 tablet  1    ALLERGIES:  is allergic to lisinopril.  REVIEW OF SYSTEMS:  The rest of the 14-point review of system was negative.   Filed Vitals:   09/22/11 0843  BP: 123/80  Pulse: 107  Temp: 96.8 F (36 C)   Wt Readings from Last 3 Encounters:  09/22/11 132 lb 6.4 oz (60.056 kg)  08/16/11 133 lb (60.328 kg)  07/14/11 127 lb (57.607 kg)   ECOG Performance status:   PHYSICAL EXAMINATION:  The patient looks well.  She is afebrile. There is no scleral icterus. Mouth and pharynx are benign. No peripheral adenopathy palpable. Heart and lungs were normal. Rhythm is regular. Once again, I did not  appreciate a systolic ejection murmur which I have heard in the past. The patient tells me she has mitral valve prolapse. The patient has a right-sided Port-A-Cath, flushed in 1/29. Abdomen with the patient  sitting is benign with no organomegaly or masses palpable.Extremities: No peripheral edema or clubbing. The patient has had a left foot drop in the past. She uses her right cane. O2 saturation was 100%.      LABORATORY/RADIOLOGY DATA:   Lab 09/22/11 0815  WBC 2.0*  HGB 12.3  HCT 36.0  PLT 169  MCV 108.0*  MCH 36.8*  MCHC 34.1  RDW 14.0  LYMPHSABS 0.9  MONOABS 0.4  EOSABS 0.1  BASOSABS 0.0  BANDABS --    CMP   No results found for this basename: NA:5,K:5,CL:5,CO2:5,GLUCOSE:5,BUN:5,CREATININE:5,GFRCGP,:5,CALCIUM:5,MG:5,AST:5,ALT:5,ALKPHOS:5,BILITOT:5 in the last 168 hours      Component Value Date/Time   BILITOT 0.4 08/16/2011 0917   BILIDIR 0.3 11/15/2010 0800   IBILI 0.3 11/15/2010 0800     Radiology Studies:  No results found.     ASSESSMENT AND PLAN:   Ms. Duskin is doing quite well at the present time, now 10 months from the time of diagnosis. The patient has completed her chemotherapy/radiation treatments. She is under observation with no treatment at the present time. Her labs demonstrate a drop in her WBC to 2.0 with ANC of 0.6 despite being asymptomatic. Neupogen was suggested to the patient, but she adamantly  declined today stating that the injection "hurts too much". Thus, we recommended that the patient return for labs in Tuesday, 2/19 at which time her WBC and ANC will be again monitored. She agreed that if there is further drop, she will receive Neupogen injection to improve these counts. She has been instructed to call us in the interim if she develops fever of 101 F, chills, night sweats or if she has any questions or concerns. In addition, her protime is supratherapeutic as well at 4 mg of Coumadin a day. Today is 44.4 and 3.7. Will hold dose today, and  continue tomorrow till Tuesday 2/19 at 2 mg a day. The labs will be rechecked on that day, and further recommendations are to proceed. We will plan to see Ms. Offner again in approximately 4 weeks at which time we will check CBC, chemistries, pro time, and LDH.  The patient will also be due for a Port-A-Cath flush with heparin at the next visit with Dr. Arline Asp .  Marland Kitchen We are also planning to carry out a PET scan  in March, a few days prior to the visit with MD.

## 2011-09-23 LAB — FOLATE RBC: RBC Folate: 1896 ng/mL (ref >=366)

## 2011-09-23 LAB — COMPREHENSIVE METABOLIC PANEL
ALT: 26 U/L (ref 0–35)
AST: 48 U/L — ABNORMAL HIGH (ref 0–37)
Calcium: 8.7 mg/dL (ref 8.4–10.5)
Chloride: 108 mEq/L (ref 96–112)
Creatinine, Ser: 1.68 mg/dL — ABNORMAL HIGH (ref 0.50–1.10)
Sodium: 143 mEq/L (ref 135–145)
Total Bilirubin: 0.3 mg/dL (ref 0.3–1.2)
Total Protein: 7.7 g/dL (ref 6.0–8.3)

## 2011-09-23 LAB — LACTATE DEHYDROGENASE: LDH: 178 U/L (ref 94–250)

## 2011-09-23 LAB — VITAMIN B12: Vitamin B-12: 885 pg/mL (ref 211–911)

## 2011-09-26 ENCOUNTER — Telehealth: Payer: Self-pay | Admitting: Oncology

## 2011-09-26 ENCOUNTER — Other Ambulatory Visit: Payer: Self-pay | Admitting: Oncology

## 2011-09-26 NOTE — Telephone Encounter (Signed)
pt called to r/s her 3/26 appt to 3/29,done     aom

## 2011-09-27 ENCOUNTER — Other Ambulatory Visit: Payer: Self-pay | Admitting: Medical Oncology

## 2011-09-27 ENCOUNTER — Encounter: Payer: Self-pay | Admitting: Medical Oncology

## 2011-09-27 ENCOUNTER — Other Ambulatory Visit (HOSPITAL_BASED_OUTPATIENT_CLINIC_OR_DEPARTMENT_OTHER): Payer: Medicare Other | Admitting: Lab

## 2011-09-27 DIAGNOSIS — D649 Anemia, unspecified: Secondary | ICD-10-CM

## 2011-09-27 DIAGNOSIS — C3490 Malignant neoplasm of unspecified part of unspecified bronchus or lung: Secondary | ICD-10-CM

## 2011-09-27 DIAGNOSIS — I4891 Unspecified atrial fibrillation: Secondary | ICD-10-CM

## 2011-09-27 LAB — CBC WITH DIFFERENTIAL/PLATELET
Basophils Absolute: 0 10*3/uL (ref 0.0–0.1)
EOS%: 1.6 % (ref 0.0–7.0)
HCT: 36.2 % (ref 34.8–46.6)
HGB: 12.2 g/dL (ref 11.6–15.9)
LYMPH%: 24.7 % (ref 14.0–49.7)
MCH: 35.1 pg — ABNORMAL HIGH (ref 25.1–34.0)
MCHC: 33.7 g/dL (ref 31.5–36.0)
MCV: 104 fL — ABNORMAL HIGH (ref 79.5–101.0)
MONO%: 15.2 % — ABNORMAL HIGH (ref 0.0–14.0)
NEUT%: 58 % (ref 38.4–76.8)

## 2011-09-29 ENCOUNTER — Other Ambulatory Visit: Payer: Self-pay | Admitting: Oncology

## 2011-09-29 DIAGNOSIS — C3490 Malignant neoplasm of unspecified part of unspecified bronchus or lung: Secondary | ICD-10-CM

## 2011-09-30 ENCOUNTER — Telehealth: Payer: Self-pay | Admitting: Oncology

## 2011-09-30 NOTE — Telephone Encounter (Signed)
called pt with pet scan for 3/20  aom

## 2011-10-01 ENCOUNTER — Other Ambulatory Visit (HOSPITAL_COMMUNITY): Payer: Self-pay | Admitting: Oncology

## 2011-10-01 DIAGNOSIS — C3490 Malignant neoplasm of unspecified part of unspecified bronchus or lung: Secondary | ICD-10-CM

## 2011-10-21 ENCOUNTER — Other Ambulatory Visit: Payer: Self-pay | Admitting: Medical Oncology

## 2011-10-21 ENCOUNTER — Encounter (HOSPITAL_COMMUNITY)
Admission: RE | Admit: 2011-10-21 | Discharge: 2011-10-21 | Disposition: A | Discharge status: Home / Self Care | Payer: Medicare Other | Source: Ambulatory Visit | Attending: Oncology | Admitting: Oncology

## 2011-10-21 ENCOUNTER — Ambulatory Visit (HOSPITAL_BASED_OUTPATIENT_CLINIC_OR_DEPARTMENT_OTHER): Payer: Medicare Other | Admitting: Lab

## 2011-10-21 ENCOUNTER — Telehealth: Payer: Self-pay | Admitting: Medical Oncology

## 2011-10-21 ENCOUNTER — Ambulatory Visit (HOSPITAL_BASED_OUTPATIENT_CLINIC_OR_DEPARTMENT_OTHER): Payer: Medicare Other

## 2011-10-21 ENCOUNTER — Telehealth: Payer: Self-pay | Admitting: Oncology

## 2011-10-21 DIAGNOSIS — I4891 Unspecified atrial fibrillation: Secondary | ICD-10-CM

## 2011-10-21 DIAGNOSIS — D649 Anemia, unspecified: Secondary | ICD-10-CM | POA: Insufficient documentation

## 2011-10-21 DIAGNOSIS — C3490 Malignant neoplasm of unspecified part of unspecified bronchus or lung: Secondary | ICD-10-CM

## 2011-10-21 LAB — PROTIME-INR

## 2011-10-21 LAB — PROTHROMBIN TIME
INR: 4.21 — ABNORMAL HIGH (ref <1.50)
Prothrombin Time: 41.2 seconds — ABNORMAL HIGH (ref 11.6–15.2)

## 2011-10-21 NOTE — Telephone Encounter (Signed)
Pt called stating that she has a tooth that has been bleeding. She called her dentist and he suggested she call us since she is on coumadin. I looked to see when she had her PT/INR checked and it was last checked with her visit 09/22/11.  Per Dr. Arline Asp she needs a PT/INR today. I gave her an appointment for 130pm.  I also asked her to call her dentist to see if they can put some type of gel pressure dressing around the tooth.

## 2011-10-21 NOTE — Progress Notes (Signed)
One unit of plasma administered per protocol per downtime procedure.   Donor number is [id number]   O positive, expires 10/26/11.  Site per right chest portacath is unremarkable with excellent blood return.  MMorris, RN

## 2011-10-21 NOTE — Telephone Encounter (Signed)
Added lb for 3/18 @ 1:45 pm. Per robin she will tell pt.

## 2011-10-22 ENCOUNTER — Other Ambulatory Visit: Payer: Self-pay | Admitting: Oncology

## 2011-10-23 ENCOUNTER — Encounter: Payer: Self-pay | Admitting: Oncology

## 2011-10-23 ENCOUNTER — Other Ambulatory Visit: Payer: Self-pay | Admitting: Oncology

## 2011-10-23 NOTE — Progress Notes (Signed)
Patient developed bleeding around her left canine tooth about 3 am on Friday 10/21/11.  PT/INR was 41.2/4.21.  Patient received 1 unit of FFP at [facility].  Patient to stop coumadin.  We will check PT/INR on Monday 3/18.  Most recent PT/INR was on 09/1911 and was 28.8/2.40.

## 2011-10-24 ENCOUNTER — Other Ambulatory Visit (HOSPITAL_BASED_OUTPATIENT_CLINIC_OR_DEPARTMENT_OTHER): Payer: Medicare Other | Admitting: Lab

## 2011-10-24 ENCOUNTER — Encounter: Payer: Self-pay | Admitting: Medical Oncology

## 2011-10-24 ENCOUNTER — Other Ambulatory Visit: Payer: Self-pay | Admitting: Medical Oncology

## 2011-10-24 DIAGNOSIS — I4891 Unspecified atrial fibrillation: Secondary | ICD-10-CM

## 2011-10-24 LAB — PROTIME-INR: Protime: 24 Seconds — ABNORMAL HIGH (ref 10.6–13.4)

## 2011-10-24 MED ORDER — WARFARIN SODIUM 1 MG PO TABS: 1.0000 mg | ORAL_TABLET | ORAL | Status: DC

## 2011-10-24 NOTE — Telephone Encounter (Signed)
I spoke with pt to let her know that  Her PT/INR was 24.0 and 2.00 today. Per Dr. Arline Asp he would like for her to take 3 mg of coumadin daily. She has 4 mg tabs and 5 mg tabs. I called her in 1 mg tabs and she will break a 4 mg in half and take with 1 mg tablet. I asked about her bleeding tooth. She states that after the fresh frozen plasma her tooth stopped bleeding. Dr. Arline Asp would like for her to follow up with her dentist. I also let her know that we are referring her to the coumadin clinic. I explained how this works. She was in agreement. Pt will be scheduled to have her PT/INR checked Thurs 10/27/11.

## 2011-10-25 ENCOUNTER — Telehealth: Payer: Self-pay | Admitting: Oncology

## 2011-10-25 ENCOUNTER — Encounter: Payer: Self-pay | Admitting: Oncology

## 2011-10-25 LAB — PREPARE FRESH FROZEN PLASMA: Unit division: 0

## 2011-10-25 NOTE — Telephone Encounter (Signed)
pt called to ck on lab appt and appt was made     aom

## 2011-10-25 NOTE — Progress Notes (Signed)
Patient restarted on Coumadin at 3 mg daily on 10/24/11 after being held over the weekend.  PT/INR  On 10/24/11 was 24.0/2.00.  To check PT/INR on Thursday 3/21.   Patient was encouraged to see her dentist.  Coumadin clinic referral was put on hold.  I'm due to see patient on 3/29.    We may want to make a cardiology referral to see if patient still needs to be on Coumadin.

## 2011-10-26 ENCOUNTER — Encounter (HOSPITAL_COMMUNITY)
Admission: RE | Admit: 2011-10-26 | Discharge: 2011-10-26 | Disposition: A | Discharge status: Home / Self Care | Payer: Medicare Other | Source: Ambulatory Visit | Attending: Oncology | Admitting: Oncology

## 2011-10-26 DIAGNOSIS — R599 Enlarged lymph nodes, unspecified: Secondary | ICD-10-CM | POA: Insufficient documentation

## 2011-10-26 DIAGNOSIS — C3490 Malignant neoplasm of unspecified part of unspecified bronchus or lung: Secondary | ICD-10-CM | POA: Insufficient documentation

## 2011-10-26 MED ORDER — FLUDEOXYGLUCOSE F - 18 (FDG) INJECTION
14.7000 | Freq: Once | INTRAVENOUS | Status: AC | PRN
  Administered 2011-10-26: 14.7 via INTRAVENOUS

## 2011-10-27 ENCOUNTER — Other Ambulatory Visit: Payer: Medicare Other | Admitting: Lab

## 2011-10-27 ENCOUNTER — Telehealth: Payer: Self-pay

## 2011-10-27 ENCOUNTER — Ambulatory Visit: Payer: Medicare Other

## 2011-10-27 NOTE — Telephone Encounter (Signed)
S/w pt that her PET scan showed improvement. Per DSM

## 2011-10-28 ENCOUNTER — Encounter: Payer: Self-pay | Admitting: Oncology

## 2011-10-28 NOTE — Progress Notes (Signed)
PET scan from 10/26/11 reviewed.  Pt's lung cancer appears to have low FDG activity.  In the future we will do CT scans of the chest with IVC if renal function permits.  Pt has some renal insufficiency.

## 2011-10-30 IMAGING — CT NM PET TUM IMG INITIAL (PI) SKULL BASE T - THIGH
7 series · 25 of 25 positions shown · IV contrast (350 OM)
Comparison: CT chest 11/10/2010, CT abdomen/pelvis 11/16/2010

***ADDENDUM*** CREATED: 12/31/2010 [DATE]

The original report was by Dr. Pauk Venture.  The following
addendum is by Dr. Bakary Schubert:
Dr. Althaf Azzamy came to the reading room to review this case today.
We discussed the high activity which projects over the basion and
cervical spine in two parallel rows.  I suspect that this could
represent misregistration of data and actually be due to high
activity in the longus coli muscles, which I have encountered in
the past as a cause for high activity giving this appearance.  The
likelihood of this pattern representing metastatic disease of the
skull base and cervical spine is considered significantly less,
although attention to this region would be suggested on any follow
up studies.
We also demonstrate a very low grade activity in the mediastinal
adenopathy shown on today's PET CT.  This includes a 1.7 x 2.1 cm
right lower paratracheal lymph node, a 2.4 x 2.1 cm precarinal
node, and a 3.3 x 2.1 cm subcarinal node.  Based on the fact that
the right hilar mass has such low activity but is proven to
represent squamous cell carcinoma, I am not confident that the lack
of high activity in these nodes necessarily represents benign
etiology, and careful follow-up is recommended.
CLINICAL DATA: Initial treatment strategy for lung cancer.  Right
upper lobe biopsy 11/11/2010.
NUCLEAR MEDICINE PET CT INITIAL (PI) SKULL BASE TO THIGH
TECHNIQUE: 17.1 mCi F-18 FDG was injected intravenously via the
right cephalic vein IV site.  Full-ring PET imaging was performed
from the skull base through the mid-thighs 65  minutes after
injection.  CT data was obtained and used for attenuation
correction and anatomic localization only.  (This was not acquired
as a diagnostic CT examination.)
Fasting Blood Glucose:  91

[Series 1: pet ac · axial · 3.3mm · 4.69mm/px · z∈[-740,-14]mm · 5 of 223 slices shown]
[im 1/223]
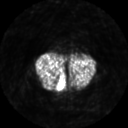
[im 56/223]
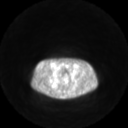
[im 112/223]
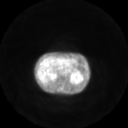
[im 167/223]
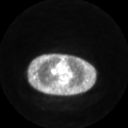
[im 223/223]
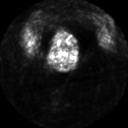

[Series 2: ct images · axial · 3.8mm · 0.98mm/px · z∈[-740,-14]mm · 5 of 223 slices shown]
[im 1/223]
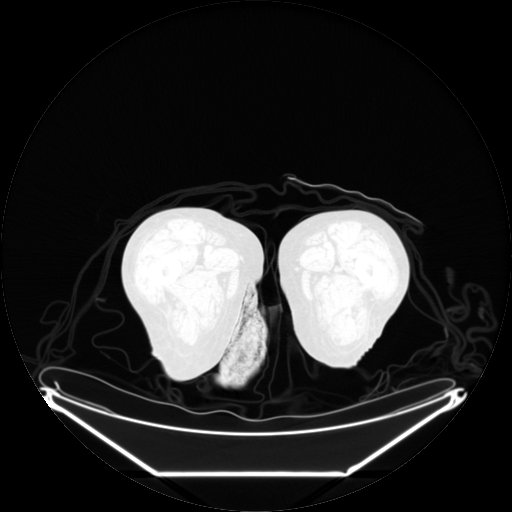
[im 56/223]
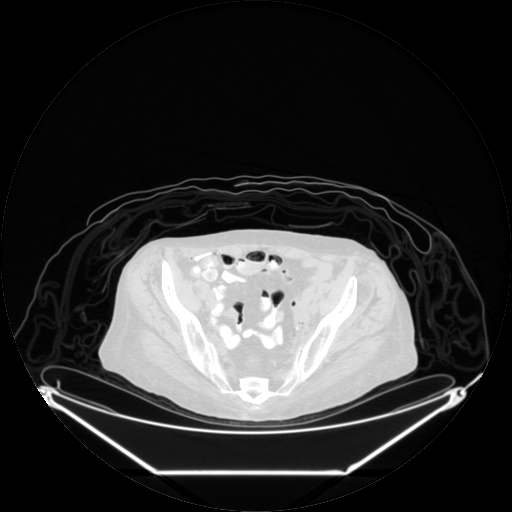
[im 112/223]
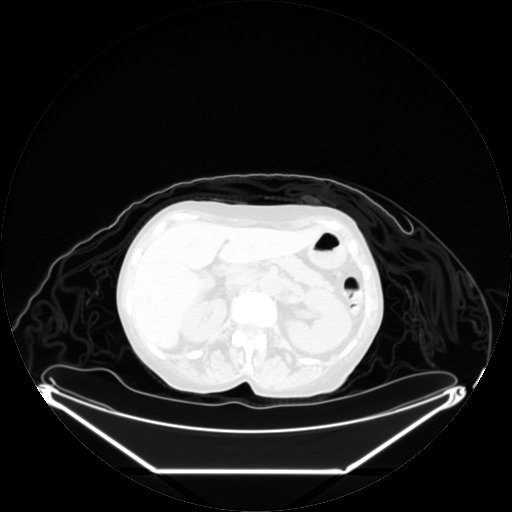
[im 167/223]
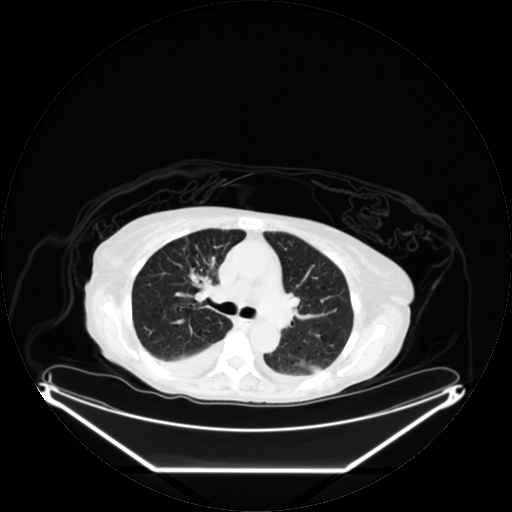
[im 223/223]
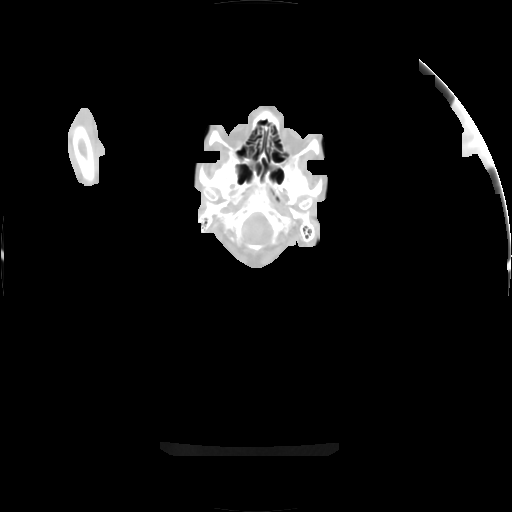

[Series 2: pet nac · axial · 3.3mm · 4.69mm/px · z∈[-740,-14]mm · 5 of 223 slices shown]
[im 1/223]
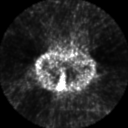
[im 56/223]
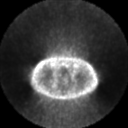
[im 112/223]
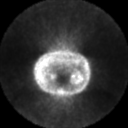
[im 167/223]
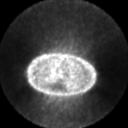
[im 223/223]
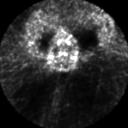

[Series 123: mip · coronal · 3.3mm · 4.69mm/px · 1 of 30 slices shown]
[im 1/30]
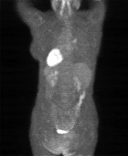

[Series 151: reformatted · axial · 3.3mm · 3.91mm/px · z∈[-740,-14]mm · 6 of 221 slices shown (1 of 2)]
[im 1/221]
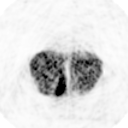
[im 45/221]
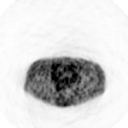
[im 89/221]
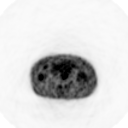
[im 133/221]
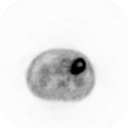
[im 177/221]
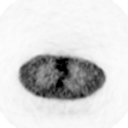
[im 221/221]
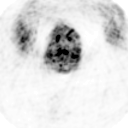

[Series 153: reformatted · coronal · 4.7mm · 5.83mm/px · 2 of 66 slices shown (2 of 2)]
[im 1/66]
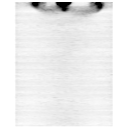
[im 66/66]
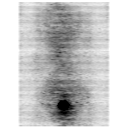

[Series 250: processed images · axial · 3.3mm · 0.98mm/px · 1 of 1 slices shown]
[im 1/1]
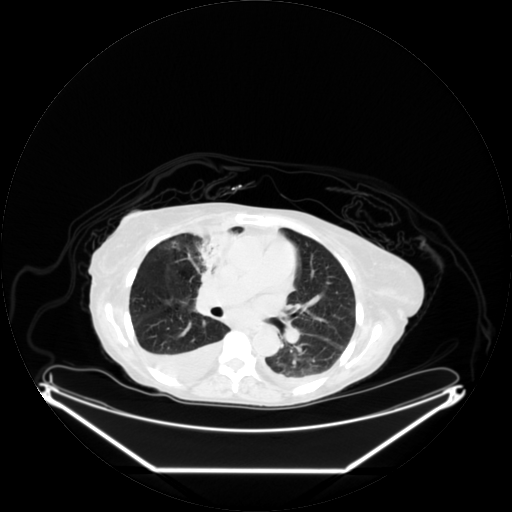

[25 of 25 positions shown; findings below may reference images not displayed]

FINDINGS: Abnormal right perihilar FDG uptake is noted, S U V maximum 3.8.

There is abnormal F D G uptake related to urinary incontinence.

Physiologic F D G uptake noted within the nondilated renal
collecting systems, bowel, myocardium.

There is abnormal F D G uptake within the skull base and adjacent
anterior aspect of the C1 ring and C2, S U V maximum 9.8.

There is a right upper lobe medial aspect consolidation without
internal abnormal F D G uptake.
IMPRESSION: Abnormal uptake within the upper cervical spine and adjacent skull
base is incompletely visualized, and is worrisome for possible
metastatic involvement, less likely degenerative change.  Dedicated
imaging of this area is recommended, preferably with contrast
enhanced CT.

Low-level abnormal right perihilar uptake, compatible with the
given diagnosis of lung cancer diagnosed at bronchoscopy of the
right upper lobe.

No abnormal F D G uptake within the abdomen or pelvis.

## 2011-10-31 ENCOUNTER — Other Ambulatory Visit: Payer: Self-pay | Admitting: Medical Oncology

## 2011-11-01 ENCOUNTER — Other Ambulatory Visit: Payer: Medicare Other | Admitting: Lab

## 2011-11-01 ENCOUNTER — Ambulatory Visit: Payer: Medicare Other | Admitting: Oncology

## 2011-11-04 ENCOUNTER — Other Ambulatory Visit (HOSPITAL_BASED_OUTPATIENT_CLINIC_OR_DEPARTMENT_OTHER): Payer: Medicare Other | Admitting: Lab

## 2011-11-04 ENCOUNTER — Encounter: Payer: Self-pay | Admitting: Oncology

## 2011-11-04 ENCOUNTER — Ambulatory Visit (HOSPITAL_BASED_OUTPATIENT_CLINIC_OR_DEPARTMENT_OTHER): Payer: Medicare Other | Admitting: Oncology

## 2011-11-04 ENCOUNTER — Telehealth: Payer: Self-pay | Admitting: Oncology

## 2011-11-04 VITALS — BP 157/97 | HR 94 | Temp 97.4°F | Ht 64.5 in | Wt 137.4 lb

## 2011-11-04 DIAGNOSIS — I4891 Unspecified atrial fibrillation: Secondary | ICD-10-CM

## 2011-11-04 DIAGNOSIS — N289 Disorder of kidney and ureter, unspecified: Secondary | ICD-10-CM

## 2011-11-04 DIAGNOSIS — D539 Nutritional anemia, unspecified: Secondary | ICD-10-CM

## 2011-11-04 DIAGNOSIS — Z5181 Encounter for therapeutic drug level monitoring: Secondary | ICD-10-CM

## 2011-11-04 DIAGNOSIS — D649 Anemia, unspecified: Secondary | ICD-10-CM

## 2011-11-04 DIAGNOSIS — C3490 Malignant neoplasm of unspecified part of unspecified bronchus or lung: Secondary | ICD-10-CM

## 2011-11-04 DIAGNOSIS — Z7901 Long term (current) use of anticoagulants: Secondary | ICD-10-CM

## 2011-11-04 LAB — COMPREHENSIVE METABOLIC PANEL
ALT: 24 U/L (ref 0–35)
Albumin: 4.4 g/dL (ref 3.5–5.2)
BUN: 19 mg/dL (ref 6–23)
CO2: 22 mEq/L (ref 19–32)
Calcium: 9 mg/dL (ref 8.4–10.5)
Chloride: 105 mEq/L (ref 96–112)
Creatinine, Ser: 1.15 mg/dL — ABNORMAL HIGH (ref 0.50–1.10)
Potassium: 3.8 mEq/L (ref 3.5–5.3)

## 2011-11-04 LAB — CBC WITH DIFFERENTIAL/PLATELET
BASO%: 0.4 % (ref 0.0–2.0)
EOS%: 0.7 % (ref 0.0–7.0)
HCT: 35.5 % (ref 34.8–46.6)
LYMPH%: 19.1 % (ref 14.0–49.7)
MCH: 35 pg — ABNORMAL HIGH (ref 25.1–34.0)
MCHC: 33.4 g/dL (ref 31.5–36.0)
NEUT%: 67.8 % (ref 38.4–76.8)
Platelets: 194 10*3/uL (ref 145–400)

## 2011-11-04 LAB — PROTIME-INR

## 2011-11-04 LAB — LACTATE DEHYDROGENASE: LDH: 183 U/L (ref 94–250)

## 2011-11-04 NOTE — Progress Notes (Signed)
This office note has been dictated.  #[id number]

## 2011-11-04 NOTE — Telephone Encounter (Signed)
Gv pt appt for april-may2013.  [facility] was closed witll call there office on 04/01 to schedule appt for pt

## 2011-11-04 NOTE — Progress Notes (Signed)
CC:   Madison [name], M.D. Madison Housekeeper, MD Madison Peer, MD Madison Showers, MD  PROBLEM LIST.:  1. 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 The patient  had a right pleural effusion at that time that was cytologically  negative. She received radiation treatments under the direction of  Dr. Margaretmary Dys, 6600 cGy in 33 fractions from January 27, 2011  through March 15, 2011, in combination with weekly carboplatin and  Taxotere. She was started on consolidative treatment with reduced  doses of carboplatin, gemcitabine, and Neulasta on May 12, 2011.  She has had significant thrombocytopenia not requiring transfusions.  Patient completed 4 cycles of consolidative chemotherapy, completed on 07/14/11.  Neulasta was omitted from cycle 4.  2. Atrial fibrillation.  3. Peripheral neuropathy of feet and hands since 1998.  4. Hypertension.  5. Incontinence of urine.  6. History of shock, diarrhea, metabolic acidosis, and renal failure,  requiring admission to the ICU in April 2012.  7. Left foot drop.  8. Anemia.  9. Renal insufficiency.  10.Right port-a-cath placed 01/20/11.    MEDICATIONS:  1. Vitamin D 1000 units.  2. Daily Cardizem CD 180 mg daily.  3. Folic acid 1 mg daily.  4. EMLA cream.  5. Claritin 10 mg daily.  6. Poly-Iron 150 mg twice a day.  7. Thiamine 100 mg daily.  8. Coumadin 4 mg daily.  9. The patient has Compazine, Zofran and Imodium to use as needed.  Medicines were reviewed and recorded. The patient informs me that she  stopped taking Neurontin.     HISTORY:  Madison [name] was seen today for followup of her stage IIIA squamous cell carcinoma involving the right upper lobe with diagnosis established in early April of 2012.  Madison [name] is here today with her husband, Madison [name].  She was last seen by [place] on 09/22/2011 and prior to that on 08/16/2011.  Her overall condition  remains quite good.  We did have a problem with Madison [name] around March 15th when the patient called to tell us that she was having bleeding from one of her teeth.  Prothrombin time at that time was 41.2 with an INR of 4.21.  She lost about a cup of blood.  Because she was actively bleeding, we had the patient come in and get 2 units of fresh frozen plasma.  On 03/18 her prothrombin time was 24.0 with an INR of 2.00.  We lowered her Coumadin dose from 4 mg to 3 mg daily and the patient has done well without any further dental or other bleeding.  Her condition overall remains excellent.  She is without any major complaints today, certainly none related to her lung cancer.  Unfortunately, she continues to have severe peripheral neuropathy, uses a cane and sometimes a walker.  She denies any falls. Her neuropathy goes back to 1998, predating her diagnosis of lung cancer by many years.  Madison [name] did undergo a PET scan on 10/26/2011.  I have carefully reviewed that with the radiologist and there is no obvious disease.  The patient was given a copy of her PET scan and we reviewed this.  PHYSICAL EXAM:  General:  Madison [name] looks well.  She is here today with a walker.  She is without complaints.  Vital signs:  Her weight today is 137.4 pounds, height 5 feet 4-1/2 inches, body surface area 1.68 meters square, blood pressure 157/97.  Other  vital signs are normal.  HEENT:  here is no scleral icterus.  Mouth and pharynx look benign.  There is no peripheral adenopathy palpable.  Heart and lungs: Normal.  Rhythm was regular at a rate of about 100.  I could not appreciate a systolic ejection murmur which I have heard in the past. The patient tells me that she has had a diagnosis of mitral valve prolapse.  There is a right-sided Port-A-Cath that was last flushed when she had her PET scan on 03/20.  Abdomen with the patient sitting is benign.  Extremities:  No peripheral edema or clubbing.  The  patient has had a left foot drop in the past.  Neurologic:  Exam is otherwise without focal findings.  LABORATORY DATA:  Today, white count 4.8, ANC 3.3, hemoglobin 11.9, hematocrit 35.5, platelets 194,000.  Prothrombin time today 22.8 with an INR of 1.90 with the patient on 3 mg per day of Coumadin.  Chemistries from 09/22/2011 were notable for a BUN of 24, creatinine 1.68.  The patient does have renal insufficiency.  AST was 48.  LDH was 178 and albumin 4.2.  Ferritin on 09/22/2011 was 1294, RBC folate 1896 and vitamin B12 level at 885.  The patient's anemia therefore is most likely related to her renal insufficiency.  CEA from 08/16/2011 was less than 0.5.  IMAGING STUDIES: 1. PET scan from 12/30/2010 showed abnormal uptake within the upper     cervical spine and adjacent skull base which is incompletely     visualized.  There was low level abnormal right perihilar uptake     compatible with the patient's diagnosis of lung cancer diagnosed at     bronchoscopy.  Tumor involved the right upper lobe.  An addendum to     the report stated that there were some alternative explanations for     the activity in the cervical spine that has more to do with     misregistration of data than the possibility of metastatic disease.     There was also noted that there was a low-grade activity in the     mediastinal adenopathy that was shown on the CT scan.  The right     lower paratracheal lymph node measured 1.7 x 2.1 cm.  There was a     2.4 x 2.1 cm precarinal lymph node, and a 3.3 x 2.1 cm subcarinal     lymph node. 2. CT scan of the chest without IV contrast from 04/19/2011 showed     improved appearance of the right chest compared with the PET scan     of 12/30/2010 and the CT scan of the chest from 11/10/2010.  Right     upper lobe dense consolidation had resolved.  Bilateral pleural     effusions had resolved.  There was a decrease in the adenopathy     seen in the chest.  There was an  indeterminate right upper lobe     nodule that warranted further followup. 3. PET scan from 10/26/2011 showed interval resolution of the     hypermetabolic right hilar lymphadenopathy as well as near-complete     resolution of the mediastinal lymphadenopathy which showed no     significant hypermetabolic activity.  There was no evidence for     extra-thoracic metastatic disease.  It was noted also that the     activity seen in the region of the skull base and C1 was no longer     visualized and was  felt to be muscular artifact from the prior PET     scan of 12/30/2010.  In reviewing this PET scan, it was suggested     that CT scans of the chest, ideally with IV contrast if patient's     renal function will allow, may give [place] the best method for followup.  IMPRESSION AND PLAN:  Mrs. Axton continues to do extremely well, now out 1 year from the time of diagnosis without evidence of disease by imaging studies and lab work.  We will plan to see Mrs. Cespedes again in approximately 2 months at which time we will check CBC, chemistries and CEA as well as a prothrombin time.  We are going to check prothrombin times every 2 weeks.  The patient will remain on Coumadin 3 mg daily.  It will be recalled that Mrs. Azizi was started on Coumadin because of some atrial fibrillation associated with her admission to the hospital in April 2012 when she was quite sick.  The question in my mind is whether she needs to still be on Coumadin.  For that reason, I am referring her to a cardiologist to assess her ongoing need for Coumadin. Hopefully Coumadin can be discontinued.  The patient also says that she will make an appointment with her dentist to assess the tooth that was bleeding.  Her prothrombin time and INR at the time of that problem were not terribly high and thus there may be some pathology associated with that tooth.    ______________________________ Samul Dada, M.D. DSM/MEDQ  D:   11/04/2011  T:  11/04/2011  Job:  [id number]

## 2011-11-07 ENCOUNTER — Telehealth: Payer: Self-pay | Admitting: Oncology

## 2011-11-07 NOTE — Telephone Encounter (Signed)
called pt and provided appt with Dr. Excell Seltzer for 04/26 @ 11:30am @ [facility].  called Dr. Gala Romney office to schedule and they stated that pt is not canidate for there office

## 2011-11-17 ENCOUNTER — Other Ambulatory Visit (HOSPITAL_BASED_OUTPATIENT_CLINIC_OR_DEPARTMENT_OTHER): Payer: Medicare Other | Admitting: Lab

## 2011-11-17 ENCOUNTER — Other Ambulatory Visit (HOSPITAL_COMMUNITY): Payer: Self-pay | Admitting: Oncology

## 2011-11-17 DIAGNOSIS — I4891 Unspecified atrial fibrillation: Secondary | ICD-10-CM

## 2011-11-17 DIAGNOSIS — Z7901 Long term (current) use of anticoagulants: Secondary | ICD-10-CM

## 2011-11-17 DIAGNOSIS — Z5181 Encounter for therapeutic drug level monitoring: Secondary | ICD-10-CM

## 2011-11-17 DIAGNOSIS — C3490 Malignant neoplasm of unspecified part of unspecified bronchus or lung: Secondary | ICD-10-CM

## 2011-11-17 LAB — PROTIME-INR

## 2011-12-01 ENCOUNTER — Other Ambulatory Visit (HOSPITAL_BASED_OUTPATIENT_CLINIC_OR_DEPARTMENT_OTHER): Payer: Medicare Other | Admitting: Lab

## 2011-12-01 DIAGNOSIS — I4891 Unspecified atrial fibrillation: Secondary | ICD-10-CM

## 2011-12-02 ENCOUNTER — Encounter: Payer: Self-pay | Admitting: Cardiovascular Disease

## 2011-12-02 ENCOUNTER — Ambulatory Visit (INDEPENDENT_AMBULATORY_CARE_PROVIDER_SITE_OTHER): Payer: Medicare Other | Admitting: Cardiovascular Disease

## 2011-12-02 VITALS — BP 140/80 | HR 108 | Ht 66.0 in | Wt 138.8 lb

## 2011-12-02 DIAGNOSIS — I4891 Unspecified atrial fibrillation: Secondary | ICD-10-CM

## 2011-12-02 NOTE — Assessment & Plan Note (Signed)
The patient has had no recurrence of her atrial fibrillation over the past year a son frequent medical visits for chemotherapy and radiation therapy. She was noted to have mild LV dysfunction and considering her age and presence of hypertension, there is significant risk of thromboembolism. Her CHADS-VASC score is 3 which would estimate an annual risk of approximately 3% for stroke. Anticoagulation is generally recommended in this situation. My recommendation is that we try to risk-stratify her further, especially since her INR has been difficult to maintain in the therapeutic range and that she has had a bleeding complication for warfarin.   I have recommended a repeat 2D echocardiogram to eval for LV dysfunction and left atrial size. She also will have an event recorder considering her palpitations. This will allow [place] to see if she is having paroxysms of atrial fib.   If there is no afib on monitoring and her LV function is preserved, I would favor discontinuation of coumadin. If there continues to be an indication for anticoagulation, we will consider changing her to either pradaxa or xarelto. I will see her back in follow-up after her echo and event monitor are completed.

## 2011-12-02 NOTE — Progress Notes (Signed)
HPI:  This is a 66 year old woman presenting for evaluation of atrial fibrillation and need for long-term anticoagulation. The patient has stage IIIa squamous cell lung cancer followed by Dr. Arline Asp. She was diagnosed approximately one year ago when presenting with acute respiratory failure. She has been treated with radiation therapy. She subsequently has undergone chemotherapy with multiple agents. Her last chemotherapy was completed in December 2012. She is also limited by severe peripheral neuropathy predating her diagnosis of lung cancer.  The patient had atrial fibrillation during her hospitalization last year. At that time she was critically ill and required mechanical ventilation. She had a large pleural effusion and underwent thoracentesis. She was treated with a strategy of rate control and anticoagulation. She has been maintained on warfarin for the past year. She did have oral bleeding last month and was noted to have an elevated INR at that time. She required FFP.  From a symptomatic perspective, the patient is doing relatively well. She denies chest pain or chest pressure. She denies shortness of breath, orthopnea, or PND. She does admit to mild ankle swelling, especially at the end of the day. She also experiences palpitations, more noticeable in the mornings. She feels that her heart races at times. To my knowledge, she has not had documented recurrence of atrial fibrillation since her hospitalization one year ago.  Outpatient Encounter Prescriptions as of 12/02/2011  Medication Sig Dispense Refill  . cholecalciferol (VITAMIN D) 1000 UNITS tablet Take 1,000 Units by mouth daily.        Marland Kitchen diltiazem (CARDIZEM CD) 180 MG 24 hr capsule Take 180 mg by mouth daily.        . folic acid (FOLVITE) 1 MG tablet Take 1 mg by mouth daily.        Marland Kitchen lidocaine-prilocaine (EMLA) cream Apply topically once.        . loperamide (IMODIUM A-D) 2 MG tablet Take 2 mg by mouth 4 (four) times daily as  needed.        . loratadine (CLARITIN) 10 MG tablet Take 10 mg by mouth daily.        Marland Kitchen POLY-IRON 150 150 MG capsule TAKE ONE CAPSULE BY MOUTH TWICE DAILY  60 capsule  1  . thiamine (VITAMIN B-1) 100 MG tablet TAKE  1/2 TABLET BY MOUTH EVERY DAY  15 tablet  2  . warfarin (COUMADIN) 1 MG tablet Take 1 tablet (1 mg total) by mouth as directed. Take 1 mg tablet with 2 mg tablet = 3 mg daily or as directed  30 tablet  2  . DISCONTD: ondansetron (ZOFRAN) 8 MG tablet Take by mouth every 8 (eight) hours as needed.        Marland Kitchen DISCONTD: prochlorperazine (COMPAZINE) 10 MG tablet Take 10 mg by mouth every 6 (six) hours as needed.        Marland Kitchen DISCONTD: warfarin (COUMADIN) 4 MG tablet TAKE ONE TABLET BY MOUTH DAILY OR AS DIRECTED  30 tablet  1    Lisinopril  Past Medical History  Diagnosis Date  . Hypertension   . Anemia   . Throat cancer     lung ca  . Lung cancer 11/11/10  . Atrial fibrillation   . Chronic kidney disease     No past surgical history on file.  History   Social History  . Marital Status: Married    Spouse Name: N/A    Number of Children: N/A  . Years of Education: N/A   Occupational History  .  Not on file.   Social History Main Topics  . Smoking status: Former Smoker -- 1.0 packs/day for 40 years    Types: Cigarettes    Quit date: 11/09/2010  . Smokeless tobacco: Not on file  . Alcohol Use: Not on file  . Drug Use: Not on file  . Sexually Active: Not on file   Other Topics Concern  . Not on file   Social History Narrative  . No narrative on file    No family history on file.  ROS: General: no fevers/chills/night sweats Eyes: no blurry vision, diplopia, or amaurosis ENT: no sore throat or hearing loss Resp: no cough, wheezing, or hemoptysis CV:  see history of present illness GI: no abdominal pain, nausea, vomiting, diarrhea, or constipation GU: no dysuria, frequency, or hematuria Skin: no rash Neuro: no headache. Positive for numbness and pain of the lower  extremities related to neuropathy Musculoskeletal: no joint pain or swelling Heme: no bleeding, DVT, or easy bruising Endo: no polydipsia or polyuria  BP 140/80  Pulse 108  Ht 5\' 6"  (1.676 m)  Wt 62.959 kg (138 lb 12.8 oz)  BMI 22.40 kg/m2  PHYSICAL EXAM: Pt is alert and oriented, WD, WN, in no distress. HEENT: normal Neck: JVP normal. Carotid upstrokes normal without bruits. No thyromegaly. Lungs: equal expansion, clear bilaterally CV: Apex is discrete and nondisplaced, RRR with grade 2/6 systolic ejection murmur at the left sternal border  Abd: soft, NT, +BS, no bruit, no hepatosplenomegaly Back: no CVA tenderness Ext: no C/C/E        Femoral pulses 2+=         DP/PT pulses intact and = Skin: warm and dry without rash Neuro: CNII-XII intact             Strength intact = bilaterally  EKG:  Sinus tachycardia with rare PVCs, left atrial enlargement, age indeterminate anterior infarct.  ASSESSMENT AND PLAN:

## 2011-12-02 NOTE — Patient Instructions (Signed)
Your physician has requested that you have an echocardiogram. Echocardiography is a painless test that uses sound waves to create images of your heart. It provides your doctor with information about the size and shape of your heart and how well your heart's chambers and valves are working. This procedure takes approximately one hour. There are no restrictions for this procedure.  Your physician has recommended that you wear an event monitor. Event monitors are medical devices that record the heart's electrical activity. Doctors most often [place] these monitors to diagnose arrhythmias. Arrhythmias are problems with the speed or rhythm of the heartbeat. The monitor is a small, portable device. You can wear one while you do your normal daily activities. This is usually used to diagnose what is causing palpitations/syncope (passing out).  Your physician recommends that you schedule a follow-up appointment in: 4 WEEKS  Your physician recommends that you continue on your current medications as directed. Please refer to the Current Medication list given to you today.

## 2011-12-13 ENCOUNTER — Encounter (INDEPENDENT_AMBULATORY_CARE_PROVIDER_SITE_OTHER): Payer: Medicare Other

## 2011-12-13 ENCOUNTER — Other Ambulatory Visit: Payer: Self-pay

## 2011-12-13 ENCOUNTER — Encounter: Payer: Self-pay | Admitting: Cardiovascular Disease

## 2011-12-13 ENCOUNTER — Ambulatory Visit (HOSPITAL_COMMUNITY): Payer: Medicare Other | Attending: Cardiovascular Disease

## 2011-12-13 DIAGNOSIS — I4891 Unspecified atrial fibrillation: Secondary | ICD-10-CM

## 2011-12-13 DIAGNOSIS — C3490 Malignant neoplasm of unspecified part of unspecified bronchus or lung: Secondary | ICD-10-CM | POA: Insufficient documentation

## 2011-12-13 DIAGNOSIS — I079 Rheumatic tricuspid valve disease, unspecified: Secondary | ICD-10-CM | POA: Insufficient documentation

## 2011-12-15 ENCOUNTER — Other Ambulatory Visit (HOSPITAL_BASED_OUTPATIENT_CLINIC_OR_DEPARTMENT_OTHER): Payer: Medicare Other | Admitting: Lab

## 2011-12-15 ENCOUNTER — Telehealth: Payer: Self-pay

## 2011-12-15 DIAGNOSIS — I4891 Unspecified atrial fibrillation: Secondary | ICD-10-CM

## 2011-12-15 LAB — PROTIME-INR: Protime: 30 Seconds — ABNORMAL HIGH (ref 10.6–13.4)

## 2011-12-15 NOTE — Telephone Encounter (Signed)
Continue coumadin at 3 mg daily. We will see her 5/24. Pt expressed understanding.

## 2011-12-28 ENCOUNTER — Other Ambulatory Visit (HOSPITAL_COMMUNITY): Payer: Self-pay | Admitting: Oncology

## 2011-12-28 DIAGNOSIS — C3490 Malignant neoplasm of unspecified part of unspecified bronchus or lung: Secondary | ICD-10-CM

## 2011-12-28 DIAGNOSIS — I4891 Unspecified atrial fibrillation: Secondary | ICD-10-CM

## 2011-12-29 ENCOUNTER — Other Ambulatory Visit: Payer: Medicare Other | Admitting: Lab

## 2011-12-29 ENCOUNTER — Ambulatory Visit: Payer: Medicare Other | Admitting: Oncology

## 2011-12-30 ENCOUNTER — Ambulatory Visit (HOSPITAL_BASED_OUTPATIENT_CLINIC_OR_DEPARTMENT_OTHER): Payer: Medicare Other | Admitting: Oncology

## 2011-12-30 ENCOUNTER — Telehealth: Payer: Self-pay | Admitting: Oncology

## 2011-12-30 ENCOUNTER — Other Ambulatory Visit: Payer: Medicare Other | Admitting: Lab

## 2011-12-30 ENCOUNTER — Encounter: Payer: Self-pay | Admitting: Oncology

## 2011-12-30 VITALS — BP 163/90 | HR 100 | Temp 97.3°F | Ht 66.0 in | Wt 141.8 lb

## 2011-12-30 DIAGNOSIS — C3490 Malignant neoplasm of unspecified part of unspecified bronchus or lung: Secondary | ICD-10-CM

## 2011-12-30 DIAGNOSIS — I4891 Unspecified atrial fibrillation: Secondary | ICD-10-CM

## 2011-12-30 LAB — COMPREHENSIVE METABOLIC PANEL
ALT: 46 U/L — ABNORMAL HIGH (ref 0–35)
AST: 73 U/L — ABNORMAL HIGH (ref 0–37)
Albumin: 4.8 g/dL (ref 3.5–5.2)
CO2: 19 mEq/L (ref 19–32)
Calcium: 9.5 mg/dL (ref 8.4–10.5)
Chloride: 106 mEq/L (ref 96–112)
Potassium: 4 mEq/L (ref 3.5–5.3)
Sodium: 141 mEq/L (ref 135–145)
Total Protein: 7.9 g/dL (ref 6.0–8.3)

## 2011-12-30 LAB — CBC WITH DIFFERENTIAL/PLATELET
BASO%: 0.4 % (ref 0.0–2.0)
Eosinophils Absolute: 0 10*3/uL (ref 0.0–0.5)
LYMPH%: 17.1 % (ref 14.0–49.7)
MCHC: 33 g/dL (ref 31.5–36.0)
MONO#: 0.6 10*3/uL (ref 0.1–0.9)
NEUT#: 3.9 10*3/uL (ref 1.5–6.5)
Platelets: 174 10*3/uL (ref 145–400)
RBC: 3.19 10*6/uL — ABNORMAL LOW (ref 3.70–5.45)
RDW: 15 % — ABNORMAL HIGH (ref 11.2–14.5)
WBC: 5.6 10*3/uL (ref 3.9–10.3)
lymph#: 1 10*3/uL (ref 0.9–3.3)
nRBC: 0 % (ref 0–0)

## 2011-12-30 LAB — PROTIME-INR: INR: 1.6 — ABNORMAL LOW (ref 2.00–3.50)

## 2011-12-30 LAB — LACTATE DEHYDROGENASE: LDH: 193 U/L (ref 94–250)

## 2011-12-30 NOTE — Telephone Encounter (Signed)
appts made and printed for pt aom °

## 2011-12-30 NOTE — Progress Notes (Signed)
This office note has been dictated.  #[id number]

## 2011-12-30 NOTE — Progress Notes (Signed)
CC:   Madison Housekeeper, MD [name], M.D. [name], MD [name], MD  PROBLEM LIST.:  1. 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 The patient  had a right pleural effusion at that time that was cytologically  negative. She received radiation treatments under the direction of  Dr. Margaretmary Dys, 6600 cGy in 33 fractions from January 27, 2011  through March 15, 2011, in combination with weekly carboplatin and  Taxotere. She was started on consolidative treatment with reduced  doses of carboplatin, gemcitabine, and Neulasta on May 12, 2011.  She has had significant thrombocytopenia not requiring transfusions.  Patient completed 4 cycles of consolidative chemotherapy, completed on 07/14/11.  Neulasta was omitted from cycle 4.  2. Atrial fibrillation.  3. Peripheral neuropathy of feet and hands since 1998.  4. Hypertension.  5. Incontinence of urine.  6. History of shock, diarrhea, metabolic acidosis, and renal failure,  requiring admission to the ICU in April 2012.  7. History of left foot drop.  8. Anemia.  9. Renal insufficiency.  10.Right port-a-cath placed 01/20/11.    MEDICATIONS:  1. Vitamin D 1000 units.  2. Daily Cardizem CD 180 mg daily.  3. Folic acid 1 mg daily.  4. EMLA cream.  5. Claritin 10 mg daily.  6. Poly-Iron 150 mg twice a day.  7. Thiamine 100 mg daily.  8. Coumadin currently 3 mg daily.  Pro-times have been drawn at the     [facility] every 2 weeks and monitored by Dr. Tonny Bollman. 9. The patient has Compazine, Zofran and Imodium to use as needed.      HISTORY:  I saw Madison [name] today for followup of her stage IIIA squamous cell carcinoma involving the right upper lobe with diagnosis established in early April 2012.  Madison [name] is here today with her husband, [name].  She was last seen by [place] on 11/04/2011.  Madison [name] tells [place] that she is  generally feeling well with no major changes in her condition.  Her neuropathy continues to be a problem.  She has urinary leakage for which she is taking an over-the-counter medicine.  She has seen Dr. Tonny Bollman recently for evaluation of her history of atrial fibrillation.  She is currently on a cardiac monitor.  I suspect that he is contemplating whether she can come off of Coumadin which was started about a year ago for atrial fibrillation that occurred in the hospital. The patient has been coming [facility] every 2 weeks for pro- time.  The results have been forwarded to Dr. Excell Seltzer.  The patient denies any change in respiratory status.  No history of pain, sense of ill health or any symptoms to suggest recurrence of her lung cancer.  PHYSICAL EXAM:  Madison [name] looks well and is in good spirits.  She is using a walker because of her peripheral neuropathy.  Weight is 141.8 pounds, height 5 feet 6 inches, body surface area 1.73 meters squared. Blood pressure 163/90.  Other vital signs are normal.  O2 saturation on room air at rest was 100%.  There is no scleral icterus.  Mouth and pharynx are benign.  No peripheral adenopathy palpable.  Heart and lungs are normal.  Rhythm was regular.  I could not appreciate a systolic ejection murmur which I have heard in the past.  There is a right-sided Port-A-Cath that was last flushed about 2 months ago.  Abdomen with the patient sitting is benign.  Extremities, no peripheral edema or clubbing.  I believe the patient's left foot drop has resolved. Dorsiflexor strength is about 4/5 bilaterally.  Neurologic exam is otherwise unremarkable.  LABORATORY DATA:  White count 5.6, ANC 3.9, hemoglobin 11.3, hematocrit 34.2, platelets 174,000.  Red cell indices are elevated, specifically MCV of 107.3 and MCH 35.4.  On 09/22/2011 red blood cell folate was 1896.  Ferritin was 1294 with an iron saturation of 32%.  Vitamin B12 level was 885.   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  Pro-time today was 19.2 with an INR of 1.60 whereas on 12/15/2011 pro-time was 30.0 with an INR of 2.50. Chemistries and a CEA today are pending.  Chemistries from 11/04/2011 were normal.  BUN was 19, creatinine 1.15.  On 09/22/2011 creatinine was 1.68.  CEA on 08/16/2011 was less than 0.5.  IMAGING STUDIES:  1. PET scan from 12/30/2010 showed abnormal uptake within the upper  cervical spine and adjacent skull base which is incompletely  visualized. There was low level abnormal right perihilar uptake  compatible with the patient's diagnosis of lung cancer diagnosed at  bronchoscopy. Tumor involved the right upper lobe. An addendum to  the report stated that there were some alternative explanations for  the activity in the cervical spine that has more to do with  misregistration of data than the possibility of metastatic disease.  There was also noted that there was a low-grade activity in the  mediastinal adenopathy that was shown on the CT scan. The right  lower paratracheal lymph node measured 1.7 x 2.1 cm. There was a  2.4 x 2.1 cm precarinal lymph node, and a 3.3 x 2.1 cm subcarinal  lymph node.  2. CT scan of the chest without IV contrast from 04/19/2011 showed  improved appearance of the right chest compared with the PET scan  of 12/30/2010 and the CT scan of the chest from 11/10/2010. Right  upper lobe dense consolidation had resolved. Bilateral pleural  effusions had resolved. There was a decrease in the adenopathy  seen in the chest. There was an indeterminate right upper lobe  nodule that warranted further followup.  3. PET scan from 10/26/2011 showed interval resolution of the  hypermetabolic right hilar lymphadenopathy as well as near-complete  resolution of the mediastinal lymphadenopathy which showed no  significant hypermetabolic activity. There was no evidence for  extra-thoracic  metastatic disease. It was noted also that the  activity seen in the region of the skull base and C1 was no longer  visualized and was felt to be muscular artifact from the prior PET  scan of 12/30/2010. In reviewing this PET scan, it was suggested  that CT scans of the chest, ideally with IV contrast if patient's  renal function will allow, may give [place] the best method for followup.   IMPRESSION AND PLAN:  Madison [name] continues to do extremely well, now over a year from the time of diagnosis with no evidence for active recurrent disease.  We are hopeful that she will continue to do well without recurrence.  Madison [name] will return next week for a Port-A-Cath flush.  She wants to use EMLA cream which she forgot today.  We will continue to schedule Madison [name] for pro-times every 2 weeks with the results forwarded to Dr. Excell Seltzer.  I asked the patient to let us know if Dr. Excell Seltzer is going to discontinue her Coumadin on the basis of  her cardiac monitoring which is currently ongoing.  If he decides to follow her Coumadin then pro-times can be drawn at the [facility] with [place].  I spoke to Madison [name] about mammograms.  I believe she is overdue.  This will be scheduled through her internist.  Will plan to see Madison [name] again in 2 months, around July 19 at which time we will check CBC, chemistries, LDH and pro-time.  It will be recalled that the patient's most recent PET scan was carried out on 10/26/2011 and we may want to reassess the status of her lung cancer with either a PET scan or possibly a CT scan of the chest with IV contrast.    ______________________________ Samul Dada, M.D. DSM/MEDQ  D:  12/30/2011  T:  12/30/2011  Job:  [id number]

## 2012-01-05 ENCOUNTER — Ambulatory Visit (HOSPITAL_BASED_OUTPATIENT_CLINIC_OR_DEPARTMENT_OTHER): Payer: Medicare Other

## 2012-01-05 VITALS — BP 169/96 | HR 112 | Temp 97.9°F

## 2012-01-05 DIAGNOSIS — Z452 Encounter for adjustment and management of vascular access device: Secondary | ICD-10-CM

## 2012-01-05 DIAGNOSIS — C3490 Malignant neoplasm of unspecified part of unspecified bronchus or lung: Secondary | ICD-10-CM

## 2012-01-05 MED ORDER — SODIUM CHLORIDE 0.9 % IJ SOLN
10.0000 mL | INTRAMUSCULAR | Status: DC | PRN
  Administered 2012-01-05: 10 mL via INTRAVENOUS
  Filled 2012-01-05: qty 10

## 2012-01-05 MED ORDER — HEPARIN SOD (PORK) LOCK FLUSH 100 UNIT/ML IV SOLN
500.0000 [IU] | Freq: Once | INTRAVENOUS | Status: AC
  Administered 2012-01-05: 500 [IU] via INTRAVENOUS
  Filled 2012-01-05: qty 5

## 2012-01-05 NOTE — Patient Instructions (Signed)
Call MD for problems 

## 2012-01-11 ENCOUNTER — Telehealth: Payer: Self-pay

## 2012-01-11 NOTE — Telephone Encounter (Signed)
I spoke with the pt and made her aware of event monitor results. Monitor showed NSR, no afib.  Per Dr Excell Seltzer pt okay to stop anticoagulation.  At this time the pt is going to continue warfarin until her follow-up with Dr Excell Seltzer. She is scheduled for an INR on 6/713 and follow-up appointment with Dr Excell Seltzer on 01/18/12.

## 2012-01-13 ENCOUNTER — Telehealth: Payer: Self-pay | Admitting: Oncology

## 2012-01-13 ENCOUNTER — Other Ambulatory Visit: Payer: Medicare Other | Admitting: Lab

## 2012-01-13 NOTE — Telephone Encounter (Signed)
pt called and l/m to cx todays lab  aom

## 2012-01-18 ENCOUNTER — Ambulatory Visit (INDEPENDENT_AMBULATORY_CARE_PROVIDER_SITE_OTHER): Payer: Medicare Other | Admitting: Cardiovascular Disease

## 2012-01-18 ENCOUNTER — Encounter: Payer: Self-pay | Admitting: Cardiovascular Disease

## 2012-01-18 VITALS — BP 150/78 | HR 100 | Ht 66.0 in | Wt 143.0 lb

## 2012-01-18 DIAGNOSIS — I10 Essential (primary) hypertension: Secondary | ICD-10-CM

## 2012-01-18 DIAGNOSIS — I4891 Unspecified atrial fibrillation: Secondary | ICD-10-CM

## 2012-01-18 MED ORDER — HYDROCHLOROTHIAZIDE 25 MG PO TABS: 25.0000 mg | ORAL_TABLET | Freq: Every day | ORAL | Status: DC

## 2012-01-18 NOTE — Progress Notes (Signed)
HPI:  This is a 66 year old woman presenting for followup of paroxysmal atrial fibrillation.  She has stage IIIa squamous cell lung cancer and is followed closely by Dr. Arline Asp. The patient had a single episode of atrial fibrillation while she was critically ill in the hospital last year. She had been continued on warfarin because of concomitant stroke risk factors of left ventricular dysfunction and hypertension. She had an episode of significant leading last year requiring FFP. When I initially saw her in April of this year, I decided to evaluate her with an echocardiogram and event recorder as it appeared she had been in sinus rhythm ever since her initial episode. Her LV function had completely normalized in comparison to her previous study which showed significant LV dysfunction when she was critically ill. Her event recorder showed only normal sinus rhythm and there were no significant arrhythmias.  She continues to feel well. She specifically denies chest pain or pressure, dyspnea, or palpitations. She's had no lightheadedness or syncope. She notes that her blood pressures have been elevated. She has chronic problems with neuropathic pains and these are unchanged.  Outpatient Encounter Prescriptions as of 01/18/2012  Medication Sig Dispense Refill  . cholecalciferol (VITAMIN D) 1000 UNITS tablet Take 1,000 Units by mouth daily.        Marland Kitchen diltiazem (CARDIZEM CD) 180 MG 24 hr capsule Take 180 mg by mouth daily.        . folic acid (FOLVITE) 1 MG tablet Take 1 mg by mouth daily.        Marland Kitchen lidocaine-prilocaine (EMLA) cream Apply topically as needed.       . loratadine (CLARITIN) 10 MG tablet Take 10 mg by mouth daily.        Marland Kitchen POLY-IRON 150 150 MG capsule TAKE ONE CAPSULE BY MOUTH TWICE DAILY  60 capsule  1  . thiamine (VITAMIN B-1) 100 MG tablet Take 100 mg by mouth daily.       Marland Kitchen DISCONTD: warfarin (COUMADIN) 1 MG tablet Take 1 tablet (1 mg total) by mouth as directed. Take 1 mg tablet with 2 mg  tablet = 3 mg daily or as directed  30 tablet  2  . hydrochlorothiazide (HYDRODIURIL) 25 MG tablet Take 1 tablet (25 mg total) by mouth daily.  30 tablet  11  . DISCONTD: loperamide (IMODIUM A-D) 2 MG tablet Take 2 mg by mouth 4 (four) times daily as needed.          Allergies  Allergen Reactions  . Lisinopril     Makes face swell    Past Medical History  Diagnosis Date  . Hypertension   . Anemia   . Throat cancer     lung ca  . Lung cancer 11/11/10  . Atrial fibrillation   . Chronic kidney disease    BP 150/78  Pulse 100  Ht 5\' 6"  (1.676 m)  Wt 64.864 kg (143 lb)  BMI 23.08 kg/m2  PHYSICAL EXAM: Pt is alert and oriented, NAD HEENT: normal Neck: JVP - normal, carotids 2+= without bruits Lungs: CTA bilaterally CV: RRR without murmur or gallop Abd: soft, NT, Positive BS, no hepatomegaly Ext: no C/C/E, distal pulses intact and equal Skin: warm/dry no rash  ASSESSMENT AND PLAN: 1. Paroxysmal atrial fibrillation. The patient has had no further evidence of atrial fib with frequent followup at the cancer Center and on an event recorder as outlined above. Her LV function has normalized. I do not think there is a strong indication  for continued oral anticoagulation. In fact, I think the risk/benefit has become unfavorable and I recommended stopping Coumadin at this time.  2. Hypertension with suboptimal control. The patient is maintained on diltiazem. I have recommended adding hydrochlorothiazide 25 mg daily. A metabolic panel will be checked in 2 weeks.  For followup I would like to see the patient back in 12 months unless problems arise.  Tonny Bollman 01/18/2012 5:57 PM

## 2012-01-18 NOTE — Patient Instructions (Addendum)
Your physician wants you to follow-up in: 12 months.  You will receive a reminder letter in the mail two months in advance. If you don't receive a letter, please call our office to schedule the follow-up appointment.  Your physician has recommended you make the following change in your medication: Stop Coumadin.  Start hydrochlorothiazide 25 mg by mouth daily.  Your physician recommends that you return for lab work in: 2 weeks--February 01, 2012.  The lab opens at 8:30

## 2012-01-20 ENCOUNTER — Other Ambulatory Visit (HOSPITAL_COMMUNITY): Payer: Self-pay | Admitting: Oncology

## 2012-01-20 DIAGNOSIS — D649 Anemia, unspecified: Secondary | ICD-10-CM

## 2012-01-24 ENCOUNTER — Other Ambulatory Visit (HOSPITAL_COMMUNITY): Payer: Self-pay | Admitting: Oncology

## 2012-01-24 DIAGNOSIS — C3490 Malignant neoplasm of unspecified part of unspecified bronchus or lung: Secondary | ICD-10-CM

## 2012-01-24 NOTE — Telephone Encounter (Signed)
per joyce in the lab the pt called to change appts,l/m for pty to call

## 2012-01-26 ENCOUNTER — Telehealth: Payer: Self-pay | Admitting: Oncology

## 2012-01-26 NOTE — Telephone Encounter (Signed)
Pt called and changed her appt to joyce for her lab draw 6/21

## 2012-01-27 ENCOUNTER — Telehealth: Payer: Self-pay

## 2012-01-27 ENCOUNTER — Other Ambulatory Visit: Payer: Self-pay | Admitting: Cardiovascular Disease

## 2012-01-27 ENCOUNTER — Other Ambulatory Visit (HOSPITAL_BASED_OUTPATIENT_CLINIC_OR_DEPARTMENT_OTHER): Payer: Medicare Other | Admitting: Lab

## 2012-01-27 ENCOUNTER — Other Ambulatory Visit: Payer: Medicare Other | Admitting: Lab

## 2012-01-27 ENCOUNTER — Other Ambulatory Visit: Payer: Self-pay

## 2012-01-27 DIAGNOSIS — N289 Disorder of kidney and ureter, unspecified: Secondary | ICD-10-CM

## 2012-01-27 DIAGNOSIS — I4891 Unspecified atrial fibrillation: Secondary | ICD-10-CM

## 2012-01-27 LAB — PROTIME-INR
INR: 1 — ABNORMAL LOW (ref 2.00–3.50)
Protime: 12 Seconds (ref 10.6–13.4)

## 2012-01-27 NOTE — Telephone Encounter (Signed)
Coumadin stopped by Dr Excell Seltzer. No need for INR tests.

## 2012-01-27 NOTE — Telephone Encounter (Signed)
lvm for pt to clarify that she has stopped coumadin per Dr Earmon Phoenix recommendation.

## 2012-01-28 LAB — BASIC METABOLIC PANEL
Calcium: 9.8 mg/dL (ref 8.4–10.5)
Creat: 1.55 mg/dL — ABNORMAL HIGH (ref 0.50–1.10)
Sodium: 137 mEq/L (ref 135–145)

## 2012-01-30 ENCOUNTER — Other Ambulatory Visit: Payer: Self-pay

## 2012-01-30 DIAGNOSIS — I10 Essential (primary) hypertension: Secondary | ICD-10-CM

## 2012-02-01 ENCOUNTER — Other Ambulatory Visit: Payer: Medicare Other

## 2012-02-10 ENCOUNTER — Other Ambulatory Visit: Payer: Medicare Other | Admitting: Lab

## 2012-02-27 ENCOUNTER — Other Ambulatory Visit: Payer: Medicare Other

## 2012-02-27 ENCOUNTER — Telehealth: Payer: Self-pay | Admitting: Oncology

## 2012-02-27 ENCOUNTER — Ambulatory Visit (HOSPITAL_BASED_OUTPATIENT_CLINIC_OR_DEPARTMENT_OTHER): Payer: Medicare Other

## 2012-02-27 ENCOUNTER — Ambulatory Visit: Payer: Medicare Other | Admitting: Oncology

## 2012-02-27 VITALS — BP 456/89 | HR 121 | Temp 97.2°F

## 2012-02-27 DIAGNOSIS — Z452 Encounter for adjustment and management of vascular access device: Secondary | ICD-10-CM

## 2012-02-27 DIAGNOSIS — C3490 Malignant neoplasm of unspecified part of unspecified bronchus or lung: Secondary | ICD-10-CM

## 2012-02-27 MED ORDER — SODIUM CHLORIDE 0.9 % IJ SOLN
10.0000 mL | INTRAMUSCULAR | Status: DC | PRN
  Administered 2012-02-27: 10 mL via INTRAVENOUS
  Filled 2012-02-27: qty 10

## 2012-02-27 MED ORDER — HEPARIN SOD (PORK) LOCK FLUSH 100 UNIT/ML IV SOLN
500.0000 [IU] | Freq: Once | INTRAVENOUS | Status: AC
  Administered 2012-02-27: 500 [IU] via INTRAVENOUS
  Filled 2012-02-27: qty 5

## 2012-02-27 NOTE — Telephone Encounter (Signed)
Pt came in today for lb/fu and appts had been cx'd due to provider but was not rescheduled and pt was not contacted. Pt sent for lb and flush. F/u r/s for 7/29 and pt given new schedule.

## 2012-03-05 ENCOUNTER — Telehealth: Payer: Self-pay | Admitting: Oncology

## 2012-03-05 ENCOUNTER — Encounter: Payer: Self-pay | Admitting: Family

## 2012-03-05 ENCOUNTER — Other Ambulatory Visit: Payer: Self-pay | Admitting: Oncology

## 2012-03-05 ENCOUNTER — Ambulatory Visit (HOSPITAL_BASED_OUTPATIENT_CLINIC_OR_DEPARTMENT_OTHER): Payer: Medicare Other | Admitting: Family

## 2012-03-05 ENCOUNTER — Ambulatory Visit (HOSPITAL_BASED_OUTPATIENT_CLINIC_OR_DEPARTMENT_OTHER): Payer: Medicare Other

## 2012-03-05 VITALS — BP 145/91 | HR 101 | Temp 97.0°F | Ht 66.0 in | Wt 144.5 lb

## 2012-03-05 DIAGNOSIS — I4891 Unspecified atrial fibrillation: Secondary | ICD-10-CM

## 2012-03-05 DIAGNOSIS — C3490 Malignant neoplasm of unspecified part of unspecified bronchus or lung: Secondary | ICD-10-CM

## 2012-03-05 DIAGNOSIS — D649 Anemia, unspecified: Secondary | ICD-10-CM

## 2012-03-05 DIAGNOSIS — C3410 Malignant neoplasm of upper lobe, unspecified bronchus or lung: Secondary | ICD-10-CM

## 2012-03-05 DIAGNOSIS — G609 Hereditary and idiopathic neuropathy, unspecified: Secondary | ICD-10-CM

## 2012-03-05 LAB — CBC WITH DIFFERENTIAL/PLATELET
BASO%: 0.7 % (ref 0.0–2.0)
EOS%: 0.6 % (ref 0.0–7.0)
MCH: 36 pg — ABNORMAL HIGH (ref 25.1–34.0)
MCHC: 34 g/dL (ref 31.5–36.0)
RDW: 14.4 % (ref 11.2–14.5)
lymph#: 1 10*3/uL (ref 0.9–3.3)

## 2012-03-05 LAB — COMPREHENSIVE METABOLIC PANEL
ALT: 64 U/L — ABNORMAL HIGH (ref 0–35)
AST: 110 U/L — ABNORMAL HIGH (ref 0–37)
Albumin: 4.6 g/dL (ref 3.5–5.2)
Calcium: 10 mg/dL (ref 8.4–10.5)
Chloride: 102 mEq/L (ref 96–112)
Potassium: 4.3 mEq/L (ref 3.5–5.3)
Sodium: 142 mEq/L (ref 135–145)
Total Protein: 7.7 g/dL (ref 6.0–8.3)

## 2012-03-05 NOTE — Patient Instructions (Signed)
Patient ID: Madison [name],   DOB: [birth year],  MRN: [id number]   [facility] Discharge Instructions  RECOMMENDATIONS MAD BY THE CONSULTANT AND ANY TEST RESULT(S) WILL BE FORWARDED TO YOU REFERRING DOCTOR   EXAM FINDINGS BY NURSE PRACTITIONER TODAY TO REPORT TO THE CLINIC OR PRIMARY PROVIDER: Please follow up with Dr. Margarita Grizzle (Urologist) regarding your urinary incontinence and nocturia   Your Current Medications Are: Current Outpatient Prescriptions  Medication Sig Dispense Refill  . cholecalciferol (VITAMIN D) 1000 UNITS tablet Take 1,000 Units by mouth daily.        Marland Kitchen diltiazem (CARDIZEM CD) 180 MG 24 hr capsule Take 180 mg by mouth daily.        . folic acid (FOLVITE) 1 MG tablet Take 1 mg by mouth daily.        . hydrochlorothiazide (HYDRODIURIL) 25 MG tablet Take 1 tablet (25 mg total) by mouth daily.  30 tablet  11  . lidocaine-prilocaine (EMLA) cream Apply topically as needed.       . loratadine (CLARITIN) 10 MG tablet Take 10 mg by mouth daily.        Marland Kitchen POLY-IRON 150 150 MG capsule TAKE ONE CAPSULE BY MOUTH TWICE DAILY  60 capsule  3  . thiamine (VITAMIN B-1) 100 MG tablet Take 100 mg by mouth daily.       Marland Kitchen DISCONTD: POLY-IRON 150 150 MG capsule TAKE ONE CAPSULE BY MOUTH TWICE DAILY  60 capsule  1     INSTRUCTIONS GIVEN, DISCUSSED AND FOLLOW-UP: (see above)  I acknowledge that I have been informed and understand all the instructions given to me and have received a copy.  I do not have any further questions at this time, but I understand that I may call the [facility] at [phone] during business hours should I have any further questions or need assistance in obtaining follow-up care.   03/05/2012, 11:23 AM

## 2012-03-05 NOTE — Progress Notes (Signed)
Patient ID: Madison [name], female   DOB: 1946/04/25, 66 y.o.   MRN: [id number] CSN: [id number]  CC: Dr. Rosezetta Schlatter, M.D. [name], M.D. [name], M.D. [name], M.D.  Problem List: Madison [name] is a 66 y.o. African-American female with a past medical history significant for: 1. 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 The patient had a right pleural effusion at that time with negative cytology. The patient received radiation treatments under the direction of Dr. Margaretmary Dys, 6600 cGy in 33 fractions from 01/27/2011 through 03/15/2011 in combination with weekly Carboplatin and Taxotere. She was started on consolidative treatment with reduced doses of Carboplatin, Gemcitabine and Neulasta on 05/12/2011. She has had significant thrombocytopenia not requiring transfusions. The patient completed 4 cycles of consolidative chemotherapy on 07/14/2011. Neulasta was omitted from cycle 4. 2. Atrial fibrillation  3. Peripheral neuropathy of feet and hands since 1998 4. Hypertension 5. Incontinence of urine 6. History of shock, diarrhea,metabolic acidosis, and renal failure requiring admission to the ICU in April 2012 7. History of left foot drop  8. Anemia 9. Renal insufficiency 10. Right Port-A-Cath placed 01/12/2011  Problem List: Past Medical History  Diagnosis Date  . Hypertension   . Anemia   . Throat cancer     lung ca  . Lung cancer 11/11/10  . Atrial fibrillation   . Chronic kidney disease    Surgical History: No past surgical history on file.  Current Medications: Current Outpatient Prescriptions  Medication Sig Dispense Refill  . cholecalciferol (VITAMIN D) 1000 UNITS tablet Take 1,000 Units by mouth daily.        Marland Kitchen diltiazem (CARDIZEM CD) 180 MG 24 hr capsule Take 180 mg by mouth daily.        . folic acid (FOLVITE) 1 MG tablet Take 1 mg by mouth daily.        .  hydrochlorothiazide (HYDRODIURIL) 25 MG tablet Take 1 tablet (25 mg total) by mouth daily.  30 tablet  11  . lidocaine-prilocaine (EMLA) cream Apply topically as needed.       . loratadine (CLARITIN) 10 MG tablet Take 10 mg by mouth daily.        Marland Kitchen POLY-IRON 150 150 MG capsule TAKE ONE CAPSULE BY MOUTH TWICE DAILY  60 capsule  3  . thiamine (VITAMIN B-1) 100 MG tablet Take 100 mg by mouth daily.       Marland Kitchen DISCONTD: POLY-IRON 150 150 MG capsule TAKE ONE CAPSULE BY MOUTH TWICE DAILY  60 capsule  1    Allergies: Allergies  Allergen Reactions  . Lisinopril     Makes face swell    Family History: The patient states there have not been any changes in her family history.  Social History: History  Substance Use Topics  . Smoking status: Former Smoker -- 1.0 packs/day for 40 years    Types: Cigarettes    Quit date: 11/09/2010  . Smokeless tobacco: Not on file  . Alcohol Use: Not on file    Review of Systems: Weight gain of 3 lbs, neuropathy of bilateral UEs and LEs and urinary incontinence/nocturia.  Otherwise negative ROS.   Physical Exam:   Blood pressure 145/91, pulse 101, temperature 97 F (36.1 C), temperature source Oral, height 5\' 6"  (1.676 m), weight 144 lb 8 oz (65.545 kg).  General appearance: Alert, cooperative, appears stated age, no distress Head: Normocephalic, without obvious abnormality, atraumatic Eyes: Conjunctivae clear, PERRLA,  EOMI Neck: No adenopathy, supple, symmetrical, trachea midline, thyroid not enlarged,  no tenderness Cardio: Regular rate and rhythm, S1, S2 normal, no murmur/click/rub/gallop GI: Soft, non-tender; bowel sounds normal; no masses,  no organomegaly   Laboratory Data: WBC 5.3, neutrophil 3.4, hemoglobin 12.1, hematocrit 35.6, platelets 160, MCV 105.9, Mono% 15.9, RBCs 3.37, MCH 36.0, MCHC 34.0 CEA, CMP and LDH are pending.  Imaging Studies: PET scan from 10/26/2011 which the impression states:  1. Interval resolution of hypermetabolic  right hilar lymphadenopathy, as well as near complete resolution of mediastinal adenopathy which shows no significant hypermetabolic activity. 2. No evidence of extra-thoracic metastatic disease   Impression/Plan: Madison [name] continues to do very well with no evidence for active or recurrent disease.  Madison [name] received a Port-A-Cath flush one week ago and will return in 2 months for another Port-A-Cath flush. The patient stated that Dr. Excell Seltzer discontinued warfarin and PT/INRs 1 month ago.  The patient still complains of urinary incontinence and nocturia.  The patient has been asked to follow-up with Dr. Margarita Grizzle, Urologist.  The patient states her neuropathy has been ongoing for 2 years.  The patient  has seen a neurologist but states the neurologist says there is nothing further they can do for her as she cannot tolerate gabapentin and she does not want to take Lyrica.  The patient states that Dr. [name] her primary care physician, typically correlates her annual mammograms. The patient states that she's never had a colonoscopy and the patient also states that she refuses the flu and pneumonia vaccines. T the patient has pending laboratories today.  The patient is scheduled for a followup visit in our office in 2 months.  A CT of the chest with contrast CMP,CBC, LDH and CEA will be obtained prior to her appointment.   Alexanderia Gorby  NP-C 03/05/2012, 1:15 PM

## 2012-03-05 NOTE — Telephone Encounter (Signed)
APPTS MADE AND PRINTED FOR PT AOM °

## 2012-04-05 ENCOUNTER — Other Ambulatory Visit: Payer: Self-pay | Admitting: Internal Medicine

## 2012-04-05 DIAGNOSIS — Z1231 Encounter for screening mammogram for malignant neoplasm of breast: Secondary | ICD-10-CM

## 2012-04-18 ENCOUNTER — Encounter (HOSPITAL_COMMUNITY): Payer: Self-pay | Admitting: Emergency Medicine

## 2012-04-18 ENCOUNTER — Inpatient Hospital Stay (HOSPITAL_COMMUNITY)
Admission: EM | Admit: 2012-04-18 | Discharge: 2012-04-25 | DRG: 872 | Disposition: A | Discharge status: Home Health | Payer: Medicare Other | Attending: Internal Medicine | Admitting: Internal Medicine

## 2012-04-18 DIAGNOSIS — R5383 Other fatigue: Secondary | ICD-10-CM | POA: Diagnosis present

## 2012-04-18 DIAGNOSIS — N390 Urinary tract infection, site not specified: Secondary | ICD-10-CM | POA: Diagnosis present

## 2012-04-18 DIAGNOSIS — G2581 Restless legs syndrome: Secondary | ICD-10-CM | POA: Diagnosis present

## 2012-04-18 DIAGNOSIS — Z85118 Personal history of other malignant neoplasm of bronchus and lung: Secondary | ICD-10-CM

## 2012-04-18 DIAGNOSIS — N179 Acute kidney failure, unspecified: Secondary | ICD-10-CM | POA: Diagnosis present

## 2012-04-18 DIAGNOSIS — R197 Diarrhea, unspecified: Secondary | ICD-10-CM | POA: Diagnosis present

## 2012-04-18 DIAGNOSIS — A419 Sepsis, unspecified organism: Principal | ICD-10-CM | POA: Diagnosis present

## 2012-04-18 DIAGNOSIS — E876 Hypokalemia: Secondary | ICD-10-CM | POA: Diagnosis present

## 2012-04-18 DIAGNOSIS — I480 Paroxysmal atrial fibrillation: Secondary | ICD-10-CM | POA: Diagnosis present

## 2012-04-18 DIAGNOSIS — E872 Acidosis, unspecified: Secondary | ICD-10-CM | POA: Diagnosis present

## 2012-04-18 DIAGNOSIS — R5381 Other malaise: Secondary | ICD-10-CM | POA: Diagnosis present

## 2012-04-18 DIAGNOSIS — N289 Disorder of kidney and ureter, unspecified: Secondary | ICD-10-CM

## 2012-04-18 DIAGNOSIS — R6510 Systemic inflammatory response syndrome (SIRS) of non-infectious origin without acute organ dysfunction: Secondary | ICD-10-CM | POA: Diagnosis present

## 2012-04-18 DIAGNOSIS — I519 Heart disease, unspecified: Secondary | ICD-10-CM

## 2012-04-18 DIAGNOSIS — I4891 Unspecified atrial fibrillation: Secondary | ICD-10-CM

## 2012-04-18 DIAGNOSIS — A0472 Enterocolitis due to Clostridium difficile, not specified as recurrent: Secondary | ICD-10-CM | POA: Diagnosis present

## 2012-04-18 DIAGNOSIS — C3490 Malignant neoplasm of unspecified part of unspecified bronchus or lung: Secondary | ICD-10-CM

## 2012-04-18 DIAGNOSIS — E860 Dehydration: Secondary | ICD-10-CM

## 2012-04-18 DIAGNOSIS — D649 Anemia, unspecified: Secondary | ICD-10-CM | POA: Diagnosis present

## 2012-04-18 LAB — CBC WITH DIFFERENTIAL/PLATELET
Basophils Relative: 0 % (ref 0–1)
Eosinophils Relative: 0 % (ref 0–5)
HCT: 31.9 % — ABNORMAL LOW (ref 36.0–46.0)
Hemoglobin: 11.4 g/dL — ABNORMAL LOW (ref 12.0–15.0)
Lymphs Abs: 0.3 10*3/uL — ABNORMAL LOW (ref 0.7–4.0)
MCH: 34.9 pg — ABNORMAL HIGH (ref 26.0–34.0)
MCV: 97.6 fL (ref 78.0–100.0)
Monocytes Relative: 10 % (ref 3–12)
Platelets: 81 10*3/uL — ABNORMAL LOW (ref 150–400)
RBC: 3.27 MIL/uL — ABNORMAL LOW (ref 3.87–5.11)
WBC: 16.3 10*3/uL — ABNORMAL HIGH (ref 4.0–10.5)

## 2012-04-18 LAB — COMPREHENSIVE METABOLIC PANEL
AST: 130 U/L — ABNORMAL HIGH (ref 0–37)
Albumin: 2.9 g/dL — ABNORMAL LOW (ref 3.5–5.2)
Alkaline Phosphatase: 86 U/L (ref 39–117)
BUN: 101 mg/dL — ABNORMAL HIGH (ref 6–23)
CO2: 15 mEq/L — ABNORMAL LOW (ref 19–32)
Chloride: 90 mEq/L — ABNORMAL LOW (ref 96–112)
GFR calc non Af Amer: 6 mL/min — ABNORMAL LOW (ref >90)
Potassium: 3.2 mEq/L — ABNORMAL LOW (ref 3.5–5.1)
Total Bilirubin: 0.8 mg/dL (ref 0.3–1.2)

## 2012-04-18 LAB — URINALYSIS, ROUTINE W REFLEX MICROSCOPIC
Glucose, UA: NEGATIVE mg/dL
Protein, ur: >300 mg/dL — AB

## 2012-04-18 LAB — URINE MICROSCOPIC-ADD ON

## 2012-04-18 MED ORDER — SODIUM CHLORIDE 0.9 % IV BOLUS (SEPSIS)
1000.0000 mL | Freq: Once | INTRAVENOUS | Status: AC
  Administered 2012-04-18: 1000 mL via INTRAVENOUS

## 2012-04-18 MED ORDER — ACETAMINOPHEN 325 MG PO TABS
650.0000 mg | ORAL_TABLET | Freq: Four times a day (QID) | ORAL | Status: DC | PRN
  Administered 2012-04-19: 650 mg via ORAL
  Filled 2012-04-18: qty 2

## 2012-04-18 MED ORDER — SODIUM CHLORIDE 0.9 % IV SOLN: INTRAVENOUS | Status: AC

## 2012-04-18 MED ORDER — METRONIDAZOLE IN NACL 5-0.79 MG/ML-% IV SOLN
500.0000 mg | Freq: Three times a day (TID) | INTRAVENOUS | Status: DC
  Administered 2012-04-19 (×2): 500 mg via INTRAVENOUS
  Filled 2012-04-18 (×4): qty 100

## 2012-04-18 MED ORDER — ACETAMINOPHEN 650 MG RE SUPP: 650.0000 mg | Freq: Four times a day (QID) | RECTAL | Status: DC | PRN

## 2012-04-18 MED ORDER — CIPROFLOXACIN IN D5W 400 MG/200ML IV SOLN
400.0000 mg | INTRAVENOUS | Status: DC
  Administered 2012-04-19 – 2012-04-24 (×7): 400 mg via INTRAVENOUS
  Filled 2012-04-18 (×9): qty 200

## 2012-04-18 MED ORDER — ONDANSETRON HCL 4 MG PO TABS: 4.0000 mg | ORAL_TABLET | Freq: Four times a day (QID) | ORAL | Status: DC | PRN

## 2012-04-18 MED ORDER — DEXTROSE 5 % IV SOLN
1.0000 g | Freq: Once | INTRAVENOUS | Status: AC
  Administered 2012-04-18: 1 g via INTRAVENOUS
  Filled 2012-04-18: qty 10

## 2012-04-18 MED ORDER — SODIUM CHLORIDE 0.9 % IV SOLN
INTRAVENOUS | Status: DC
  Administered 2012-04-19: 1000 mL via INTRAVENOUS

## 2012-04-18 MED ORDER — ONDANSETRON HCL 4 MG/2ML IJ SOLN
4.0000 mg | Freq: Four times a day (QID) | INTRAMUSCULAR | Status: DC | PRN
  Administered 2012-04-22: 4 mg via INTRAVENOUS
  Filled 2012-04-18: qty 2

## 2012-04-18 NOTE — H&P (Addendum)
Madison [name] is an 66 y.o. female.  Patient was seen and examined on April 18, 2012 at 11:15 PM. PCP -Dr. Clent Ridges of [facility]. Oncologist - Dr. Arline Asp.  Chief Complaint: Weakness and diarrhea. HPI: 66 year-old female with known history of squamous cell lung cancer stage IIIa status post radiation and chemotherapy last dose October 2012, history of atrial fibrillation presently not on Coumadin, history of LV dysfunction with last EF measured was 40-45%, admitted last year for septic shock secondary to pneumonia and diarrhea, presents to the ER because of weakness and persistent diarrhea over the last one week. Patient has been having continuous diarrhea multiple times daily watery with no abdominal pain and had only one episode of vomiting last week. Patient has been having intense fatigue and weakness. Occasionally has subjective feeling of fever and chills. Patient is making urine. In the ER patient was found to have tachycardia but not hypotensive. Patient's creatinine has gone from normal to 6 with metabolic acidosis. In addition patient has mild leukocytosis. Patient will be admitted for further management. Patient denies any chest pain or shortness of breath. Patient denies any recent travel, use of antibiotics or hospitalization.  Past Medical History  Diagnosis Date  . Hypertension   . Anemia   . Throat cancer     lung ca  . Lung cancer 11/11/10  . Atrial fibrillation   . Chronic kidney disease     Past Surgical History  Procedure Date  . Port a cath placement     History reviewed. No pertinent family history. Social History:  reports that she quit smoking about 17 months ago. Her smoking use included Cigarettes. She has a 40 pack-year smoking history. She does not have any smokeless tobacco history on file. She reports that she does not drink alcohol. Her drug history not on file.  Allergies:  Allergies  Allergen Reactions  . Lisinopril     Makes face swell      (Not in a hospital admission)  Results for orders placed during the hospital encounter of 04/18/12 (from the past 48 hour(s))  URINALYSIS, ROUTINE W REFLEX MICROSCOPIC     Status: Abnormal   Collection Time   04/18/12  8:10 PM      Component Value Range Comment   Color, Urine YELLOW  YELLOW    APPearance TURBID (*) CLEAR    Specific Gravity, Urine 1.020  1.005 - 1.030    pH 6.0  5.0 - 8.0    Glucose, UA NEGATIVE  NEGATIVE mg/dL    Hgb urine dipstick LARGE (*) NEGATIVE    Bilirubin Urine SMALL (*) NEGATIVE    Ketones, ur TRACE (*) NEGATIVE mg/dL    Protein, ur >161 (*) NEGATIVE mg/dL    Urobilinogen, UA 1.0  0.0 - 1.0 mg/dL    Nitrite NEGATIVE  NEGATIVE    Leukocytes, UA LARGE (*) NEGATIVE   URINE MICROSCOPIC-ADD ON     Status: Abnormal   Collection Time   04/18/12  8:10 PM      Component Value Range Comment   Squamous Epithelial / LPF FEW (*) RARE    WBC, UA TOO NUMEROUS TO COUNT  <3 WBC/hpf    RBC / HPF 3-6  <3 RBC/hpf    Bacteria, UA FEW (*) RARE   CBC WITH DIFFERENTIAL     Status: Abnormal   Collection Time   04/18/12  8:45 PM      Component Value Range Comment   WBC 16.3 (*) 4.0 -  10.5 K/uL    RBC 3.27 (*) 3.87 - 5.11 MIL/uL    Hemoglobin 11.4 (*) 12.0 - 15.0 g/dL    HCT 29.5 (*) 28.4 - 46.0 %    MCV 97.6  78.0 - 100.0 fL    MCH 34.9 (*) 26.0 - 34.0 pg    MCHC 35.7  30.0 - 36.0 g/dL    RDW 13.2  44.0 - 10.2 %    Platelets 81 (*) 150 - 400 K/uL    Neutrophils Relative 88 (*) 43 - 77 %    Lymphocytes Relative 2 (*) 12 - 46 %    Monocytes Relative 10  3 - 12 %    Eosinophils Relative 0  0 - 5 %    Basophils Relative 0  0 - 1 %    Neutro Abs 14.4 (*) 1.7 - 7.7 K/uL    Lymphs Abs 0.3 (*) 0.7 - 4.0 K/uL    Monocytes Absolute 1.6 (*) 0.1 - 1.0 K/uL    Eosinophils Absolute 0.0  0.0 - 0.7 K/uL    Basophils Absolute 0.0  0.0 - 0.1 K/uL    WBC Morphology TOXIC GRANULATION      Smear Review LARGE PLATELETS PRESENT     COMPREHENSIVE METABOLIC PANEL     Status: Abnormal    Collection Time   04/18/12  8:45 PM      Component Value Range Comment   Sodium 133 (*) 135 - 145 mEq/L REPEATED TO VERIFY   Potassium 3.2 (*) 3.5 - 5.1 mEq/L REPEATED TO VERIFY   Chloride 90 (*) 96 - 112 mEq/L REPEATED TO VERIFY   CO2 15 (*) 19 - 32 mEq/L REPEATED TO VERIFY   Glucose, Bld 110 (*) 70 - 99 mg/dL    BUN 725 (*) 6 - 23 mg/dL    Creatinine, Ser 3.66 (*) 0.50 - 1.10 mg/dL    Calcium 8.3 (*) 8.4 - 10.5 mg/dL    Total Protein 7.5  6.0 - 8.3 g/dL    Albumin 2.9 (*) 3.5 - 5.2 g/dL    AST 440 (*) 0 - 37 U/L    ALT 63 (*) 0 - 35 U/L    Alkaline Phosphatase 86  39 - 117 U/L    Total Bilirubin 0.8  0.3 - 1.2 mg/dL    GFR calc non Af Amer 6 (*) >90 mL/min    GFR calc Af Amer 7 (*) >90 mL/min    No results found.  Review of Systems  Constitutional: Positive for malaise/fatigue.  HENT: Negative.   Eyes: Negative.   Respiratory: Negative.   Cardiovascular: Negative.   Gastrointestinal: Positive for diarrhea.  Genitourinary: Negative.   Musculoskeletal: Negative.   Skin: Negative.   Neurological: Positive for weakness.  Endo/Heme/Allergies: Negative.   Psychiatric/Behavioral: Negative.     Blood pressure 112/61, pulse 110, temperature 98.4 F (36.9 C), resp. rate 16, SpO2 100.00%. Physical Exam  Constitutional: She is oriented to person, place, and time. She appears well-developed and well-nourished. No distress.  HENT:  Head: Normocephalic and atraumatic.  Right Ear: External ear normal.  Left Ear: External ear normal.  Nose: Nose normal.  Mouth/Throat: Oropharynx is clear and moist. No oropharyngeal exudate.  Eyes: Conjunctivae normal are normal. Pupils are equal, round, and reactive to light. Right eye exhibits no discharge. Left eye exhibits no discharge. No scleral icterus.  Neck: Normal range of motion. Neck supple.  Cardiovascular:       Tachycardia.  Respiratory: Effort normal and breath sounds normal.  No respiratory distress. She has no wheezes. She has no  rales.  GI: Soft. Bowel sounds are normal. She exhibits no distension. There is no tenderness. There is no rebound.  Musculoskeletal: Normal range of motion. She exhibits no edema and no tenderness.  Neurological: She is alert and oriented to person, place, and time.       Moves all extremities.  Skin: Skin is warm and dry. She is not diaphoretic.     Assessment/Plan #1. SIRS probably secondary to gastroenteritis - given patient's renal failure and tachycardia at this time patient is receiving her second liter of normal saline bolus after which we will continue with normal saline at 150 cc per hour. Patient does have a history of LV dysfunction closely follow respiratory status and oxygen saturation. At this time in addition I have ordered lactic acid, procalcitonin levels blood cultures urine cultures stool studies and C. difficile PCR. Empirically place patient on ciprofloxacin and Flagyl. #2. Acute renal failure nonoliguric with metabolic acidosis -  most likely from dehydration from the diarrhea. Aggressively hydrate. Closely follow intake output. Urine does not show any significant casts. Check renal sonogram. #3. Possible UTI - check urine cultures. Patient is on ciprofloxacin. #4. Thrombocytopenia - possibly secondary to her infectious cause. Given that patient also has renal failure we will check peripheral smear for any schistocytes. Closely follow CBC. Check INR. #5. History of atrial fibrillation - monitor in telemetry. Check EKG. Patient is not on Coumadin was recently discontinued by cardiologist. #6. History of hypertension - was on HCTZ will discontinue and hydrate. #7. History of non-small cell lung cancer.  CODE STATUS - full code.  We will closely monitor in telemetry. I will have low threshold to transfer to ICU if patient gets hypotensive.  Lysander Calixte N. 04/18/2012, 11:26 PM Addendum - patient's procalcitonin levels have resulted and shows a very high level. I did  discuss with pulmonary critical care consultant Dr. Marchelle Gearing. At this time we are going to bolus patient with 3 L of normal saline. Check ABG. Recheck lactic acid levels in a.m.  Midge Minium  Addendum - at this time as patient is requiring a lot of fluid and more closer attention, patient will be transferred to step down. I have consulted critical care. I have reexamined the patient at the bedside. Patient is not in acute distress. Patient's repeat labs shows decreasing hemoglobin and platelets. Patient also has hypokalemia. Bicarbonate is further worsened. Patient's renal sonogram does not show any obstruction but does show cholelithiasis with sludge. I have ordered a HIDA scan. Recheck metabolic panel and CBC few hours. Type and screen if in case patient needs transfusion. I have also  ordered lipase and troponin. Follow all pending labs including peripheral smear study and LDH.  Simon Rhein

## 2012-04-18 NOTE — ED Notes (Signed)
Diarrhea started 4 days ago, has not been able to eat anything for 4 days, saw primary care dr-- [facility] today-- sent here. Brought lab results

## 2012-04-18 NOTE — ED Provider Notes (Signed)
History     CSN: [id number]  Arrival date & time 04/18/12  1742   First MD Initiated Contact with Patient 04/18/12 1923      Chief Complaint  Patient presents with  . abnormal labs    . dehydrated    . Diarrhea    (Consider location/radiation/quality/duration/timing/severity/associated sxs/prior treatment) Patient is a 66 y.o. female presenting with diarrhea. The history is provided by the patient.  Diarrhea The primary symptoms include fatigue, nausea and diarrhea. Primary symptoms do not include abdominal pain, vomiting or rash.  The illness does not include back pain.   patient has had diarrhea over the last week. Some nausea and decreased oral intake. She states she feels fatigued. She was seen at [facility] and was transferred here with lab abnormalities. She had a creatinine of 6 and BUN of 100. Potassium was fine. She states she's had no previous kidney problems. She has a previous lung cancer history, however she does not currently on chemotherapy.  Past Medical History  Diagnosis Date  . Hypertension   . Anemia   . Throat cancer     lung ca  . Lung cancer 11/11/10  . Atrial fibrillation   . Chronic kidney disease     Past Surgical History  Procedure Date  . Port a cath placement     History reviewed. No pertinent family history.  History  Substance Use Topics  . Smoking status: Former Smoker -- 1.0 packs/day for 40 years    Types: Cigarettes    Quit date: 11/09/2010  . Smokeless tobacco: Not on file  . Alcohol Use: No    OB History    Grav Para Term Preterm Abortions TAB SAB Ect Mult Living                  Review of Systems  Constitutional: Positive for fatigue. Negative for activity change and appetite change.  HENT: Negative for neck stiffness.   Eyes: Negative for pain.  Respiratory: Negative for chest tightness and shortness of breath.   Cardiovascular: Negative for chest pain and leg swelling.  Gastrointestinal: Positive for nausea and diarrhea.  Negative for vomiting and abdominal pain.  Genitourinary: Negative for flank pain.  Musculoskeletal: Negative for back pain.  Skin: Negative for rash.  Neurological: Positive for light-headedness. Negative for weakness, numbness and headaches.  Psychiatric/Behavioral: Negative for behavioral problems.    Allergies  Lisinopril  Home Medications   Current Outpatient Rx  Name Route Sig Dispense Refill  . VITAMIN D 1000 UNITS PO TABS Oral Take 1,000 Units by mouth daily.      Marland Kitchen DILTIAZEM HCL ER COATED BEADS 180 MG PO CP24 Oral Take 180 mg by mouth daily.      Marland Kitchen FOLIC ACID 1 MG PO TABS Oral Take 1 mg by mouth daily.      Marland Kitchen HYDROCHLOROTHIAZIDE 25 MG PO TABS Oral Take 1 tablet (25 mg total) by mouth daily. 30 tablet 11  . POLYSACCHARIDE IRON COMPLEX 150 MG PO CAPS Oral Take 150 mg by mouth daily.    Marland Kitchen LIDOCAINE-PRILOCAINE 2.5-2.5 % EX KIT Topical Apply 1 application topically as needed. For port-a-cath access.    Marland Kitchen LORATADINE 10 MG PO TABS Oral Take 10 mg by mouth daily.      . ADULT MULTIVITAMIN W/MINERALS CH Oral Take 1 tablet by mouth daily.    Marland Kitchen VITAMIN B-1 100 MG PO TABS Oral Take 100 mg by mouth daily.       BP 140/77  Pulse 107  Temp 98.4 F (36.9 C)  Resp 24  SpO2 98%  Physical Exam  Nursing note and vitals reviewed. Constitutional: She is oriented to person, place, and time. She appears well-developed and well-nourished.  HENT:  Head: Normocephalic and atraumatic.  Eyes: EOM are normal. Pupils are equal, round, and reactive to light.  Neck: Normal range of motion. Neck supple.  Cardiovascular: Regular rhythm and normal heart sounds.   No murmur heard.      Mild tachycardia  Pulmonary/Chest: Effort normal and breath sounds normal. No respiratory distress. She has no wheezes. She has no rales.  Abdominal: Soft. Bowel sounds are normal. She exhibits no distension. There is no tenderness. There is no rebound and no guarding.  Musculoskeletal: Normal range of motion.    Neurological: She is alert and oriented to person, place, and time. No cranial nerve deficit.  Skin: Skin is warm and dry.  Psychiatric: She has a normal mood and affect. Her speech is normal.    ED Course  Procedures (including critical care time)  Labs Reviewed  CBC WITH DIFFERENTIAL - Abnormal; Notable for the following:    WBC 16.3 (*)     RBC 3.27 (*)     Hemoglobin 11.4 (*)     HCT 31.9 (*)     MCH 34.9 (*)     Platelets 81 (*)     Neutrophils Relative 88 (*)     Lymphocytes Relative 2 (*)     Neutro Abs 14.4 (*)     Lymphs Abs 0.3 (*)     Monocytes Absolute 1.6 (*)     All other components within normal limits  COMPREHENSIVE METABOLIC PANEL - Abnormal; Notable for the following:    Sodium 133 (*)  REPEATED TO VERIFY   Potassium 3.2 (*)  REPEATED TO VERIFY   Chloride 90 (*)  REPEATED TO VERIFY   CO2 15 (*)  REPEATED TO VERIFY   Glucose, Bld 110 (*)     BUN 101 (*)     Creatinine, Ser 6.29 (*)     Calcium 8.3 (*)     Albumin 2.9 (*)     AST 130 (*)     ALT 63 (*)     GFR calc non Af Amer 6 (*)     GFR calc Af Amer 7 (*)     All other components within normal limits  URINALYSIS, ROUTINE W REFLEX MICROSCOPIC - Abnormal; Notable for the following:    APPearance TURBID (*)     Hgb urine dipstick LARGE (*)     Bilirubin Urine SMALL (*)     Ketones, ur TRACE (*)     Protein, ur >300 (*)     Leukocytes, UA LARGE (*)     All other components within normal limits  URINE MICROSCOPIC-ADD ON - Abnormal; Notable for the following:    Squamous Epithelial / LPF FEW (*)     Bacteria, UA FEW (*)     All other components within normal limits  URINE CULTURE  LACTIC ACID, PLASMA  PROCALCITONIN  CULTURE, BLOOD (ROUTINE X 2)  CULTURE, BLOOD (ROUTINE X 2)  PROTIME-INR  STOOL CULTURE  CLOSTRIDIUM DIFFICILE BY PCR  PATHOLOGIST SMEAR REVIEW   No results found.   1. Renal insufficiency   2. Anemia   3. Dehydration   4. UTI (urinary tract infection)   5. ARF (acute renal  failure)   6. Atrial fibrillation   7. Diarrhea   8. SIRS (systemic  inflammatory response syndrome)       MDM  Patient was sent in from [place] clinic with new onset renal failure. She's not hyperkalemic. She has had diarrhea for a week and dehydration may be a cause. Blood pressure is overall good. She has some tachycardia. She's had IV fluids and states that she feels better. She has an apparent UTI. Patient be admitted to medicine.        Juliet Rude. Rubin Payor, MD 04/19/12 1610

## 2012-04-18 NOTE — Progress Notes (Signed)
ANTIBIOTIC CONSULT NOTE - INITIAL  Pharmacy Consult for Cipro Indication: Gastroenteris and UTI  Allergies  Allergen Reactions  . Lisinopril     Makes face swell    Patient Measurements:   Wt=65.5 kg  Vital Signs: Temp: 98.4 F (36.9 C) (09/11 1831) BP: 112/61 mmHg (09/11 1831) Pulse Rate: 110  (09/11 1831) Intake/Output from previous day:   Intake/Output from this shift:    Labs:  [facility] 04/18/12 2045  WBC 16.3*  HGB 11.4*  PLT 81*  LABCREA --  CREATININE 6.29*   The CrCl is unknown because both a height and weight (above a minimum accepted value) are required for this calculation. No results found for this basename: VANCOTROUGH:2,VANCOPEAK:2,VANCORANDOM:2,GENTTROUGH:2,GENTPEAK:2,GENTRANDOM:2,TOBRATROUGH:2,TOBRAPEAK:2,TOBRARND:2,AMIKACINPEAK:2,AMIKACINTROU:2,AMIKACIN:2, in the last 72 hours   Microbiology: No results found for this or any previous visit (from the past 720 hour(s)).  Medical History: Past Medical History  Diagnosis Date  . Hypertension   . Anemia   . Throat cancer     lung ca  . Lung cancer 11/11/10  . Atrial fibrillation   . Chronic kidney disease     Medications:  Scheduled:    . sodium chloride   Intravenous STAT  . cefTRIAXone (ROCEPHIN)  IV  1 g Intravenous Once  . ciprofloxacin  400 mg Intravenous Q24H  . metronidazole  500 mg Intravenous Q8H  . sodium chloride  1,000 mL Intravenous Once  . sodium chloride  1,000 mL Intravenous Once   Infusions:    . sodium chloride     Assessment: 66 yo female with history of Lung Ca. Presenting today with weakness and persistent diarrhea.  MD ordering Cipro per Rx for  Gastroenteritis and UTI.  Goal of Therapy:  Treat infection  Plan:   Cipro 400mg  IV q24h. CrCl~10 ml/min  Adjust frequency as renal function improves/pt is rehydrated.    Madison [name] 04/18/2012,11:42 PM

## 2012-04-19 ENCOUNTER — Inpatient Hospital Stay (HOSPITAL_COMMUNITY): Payer: Medicare Other

## 2012-04-19 ENCOUNTER — Encounter (HOSPITAL_COMMUNITY): Payer: Self-pay | Admitting: *Deleted

## 2012-04-19 DIAGNOSIS — D649 Anemia, unspecified: Secondary | ICD-10-CM

## 2012-04-19 DIAGNOSIS — N179 Acute kidney failure, unspecified: Secondary | ICD-10-CM

## 2012-04-19 DIAGNOSIS — C3490 Malignant neoplasm of unspecified part of unspecified bronchus or lung: Secondary | ICD-10-CM

## 2012-04-19 DIAGNOSIS — E872 Acidosis: Secondary | ICD-10-CM

## 2012-04-19 DIAGNOSIS — N289 Disorder of kidney and ureter, unspecified: Secondary | ICD-10-CM

## 2012-04-19 DIAGNOSIS — E860 Dehydration: Secondary | ICD-10-CM

## 2012-04-19 LAB — LACTATE DEHYDROGENASE: LDH: 288 U/L — ABNORMAL HIGH (ref 94–250)

## 2012-04-19 LAB — LACTIC ACID, PLASMA
Lactic Acid, Venous: 0.7 mmol/L (ref 0.5–2.2)
Lactic Acid, Venous: 1.1 mmol/L (ref 0.5–2.2)

## 2012-04-19 LAB — BASIC METABOLIC PANEL
CO2: 13 mEq/L — ABNORMAL LOW (ref 19–32)
Calcium: 6.9 mg/dL — ABNORMAL LOW (ref 8.4–10.5)
Chloride: 101 mEq/L (ref 96–112)
Glucose, Bld: 82 mg/dL (ref 70–99)
Potassium: 3.4 mEq/L — ABNORMAL LOW (ref 3.5–5.1)
Sodium: 136 mEq/L (ref 135–145)

## 2012-04-19 LAB — GLUCOSE, CAPILLARY
Glucose-Capillary: 75 mg/dL (ref 70–99)
Glucose-Capillary: 107 mg/dL — ABNORMAL HIGH (ref 70–99)

## 2012-04-19 LAB — CBC WITH DIFFERENTIAL/PLATELET
Basophils Relative: 0 % (ref 0–1)
HCT: 26.3 % — ABNORMAL LOW (ref 36.0–46.0)
Hemoglobin: 9.2 g/dL — ABNORMAL LOW (ref 12.0–15.0)
Lymphocytes Relative: 3 % — ABNORMAL LOW (ref 12–46)
Monocytes Relative: 8 % (ref 3–12)
Neutro Abs: 16.6 10*3/uL — ABNORMAL HIGH (ref 1.7–7.7)
RBC: 2.68 MIL/uL — ABNORMAL LOW (ref 3.87–5.11)
WBC: 18.7 10*3/uL — ABNORMAL HIGH (ref 4.0–10.5)

## 2012-04-19 LAB — BLOOD GAS, ARTERIAL
Bicarbonate: 13.2 mEq/L — ABNORMAL LOW (ref 20.0–24.0)
TCO2: 12.4 mmol/L (ref 0–100)
pCO2 arterial: 22.8 mmHg — ABNORMAL LOW (ref 35.0–45.0)
pH, Arterial: 7.381 (ref 7.350–7.450)

## 2012-04-19 LAB — COMPREHENSIVE METABOLIC PANEL
ALT: 40 U/L — ABNORMAL HIGH (ref 0–35)
BUN: 95 mg/dL — ABNORMAL HIGH (ref 6–23)
Calcium: 6.5 mg/dL — ABNORMAL LOW (ref 8.4–10.5)
Creatinine, Ser: 5.35 mg/dL — ABNORMAL HIGH (ref 0.50–1.10)
GFR calc Af Amer: 9 mL/min — ABNORMAL LOW (ref >90)
Glucose, Bld: 88 mg/dL (ref 70–99)
Sodium: 137 mEq/L (ref 135–145)
Total Protein: 5.3 g/dL — ABNORMAL LOW (ref 6.0–8.3)

## 2012-04-19 LAB — TROPONIN I: Troponin I: <0.3 ng/mL (ref <0.30)

## 2012-04-19 LAB — MRSA PCR SCREENING: MRSA by PCR: NEGATIVE

## 2012-04-19 LAB — CBC
Hemoglobin: 10.4 g/dL — ABNORMAL LOW (ref 12.0–15.0)
MCH: 34.8 pg — ABNORMAL HIGH (ref 26.0–34.0)
RBC: 2.99 MIL/uL — ABNORMAL LOW (ref 3.87–5.11)
WBC: 17.9 10*3/uL — ABNORMAL HIGH (ref 4.0–10.5)

## 2012-04-19 LAB — PROTIME-INR: INR: 1.15 (ref 0.00–1.49)

## 2012-04-19 MED ORDER — POTASSIUM CHLORIDE CRYS ER 20 MEQ PO TBCR
40.0000 meq | EXTENDED_RELEASE_TABLET | Freq: Two times a day (BID) | ORAL | Status: DC
  Administered 2012-04-20: 40 meq via ORAL
  Filled 2012-04-19 (×3): qty 2

## 2012-04-19 MED ORDER — DEXTROSE-NACL 5-0.9 % IV SOLN
INTRAVENOUS | Status: DC
  Administered 2012-04-19: 11:00:00 via INTRAVENOUS

## 2012-04-19 MED ORDER — DILTIAZEM HCL ER COATED BEADS 180 MG PO CP24
180.0000 mg | ORAL_CAPSULE | Freq: Every day | ORAL | Status: DC
  Filled 2012-04-19: qty 1

## 2012-04-19 MED ORDER — VITAMINS A & D EX OINT
TOPICAL_OINTMENT | CUTANEOUS | Status: AC
  Administered 2012-04-19: 5
  Filled 2012-04-19: qty 5

## 2012-04-19 MED ORDER — SODIUM CHLORIDE 0.9 % IJ SOLN
3.0000 mL | Freq: Two times a day (BID) | INTRAMUSCULAR | Status: DC
  Administered 2012-04-19: 23:00:00 via INTRAVENOUS
  Administered 2012-04-20 – 2012-04-23 (×2): 3 mL via INTRAVENOUS
  Administered 2012-04-25: 10 mL via INTRAVENOUS

## 2012-04-19 MED ORDER — POTASSIUM CHLORIDE 20 MEQ/15ML (10%) PO LIQD
40.0000 meq | ORAL | Status: DC
  Filled 2012-04-19: qty 30

## 2012-04-19 MED ORDER — FOLIC ACID 1 MG PO TABS
1.0000 mg | ORAL_TABLET | Freq: Every day | ORAL | Status: DC
  Administered 2012-04-20 – 2012-04-22 (×3): 1 mg via ORAL
  Filled 2012-04-19 (×4): qty 1

## 2012-04-19 MED ORDER — POTASSIUM CHLORIDE 10 MEQ/100ML IV SOLN
10.0000 meq | INTRAVENOUS | Status: AC
  Administered 2012-04-19 (×3): 10 meq via INTRAVENOUS
  Filled 2012-04-19 (×3): qty 100

## 2012-04-19 MED ORDER — SODIUM CHLORIDE 0.9 % IV BOLUS (SEPSIS)
3000.0000 mL | Freq: Once | INTRAVENOUS | Status: AC
  Administered 2012-04-19: 3000 mL via INTRAVENOUS

## 2012-04-19 MED ORDER — SODIUM CHLORIDE 0.9 % IV BOLUS (SEPSIS)
1000.0000 mL | Freq: Once | INTRAVENOUS | Status: AC
  Administered 2012-04-19: 1000 mL via INTRAVENOUS

## 2012-04-19 MED ORDER — METRONIDAZOLE 500 MG PO TABS
500.0000 mg | ORAL_TABLET | Freq: Three times a day (TID) | ORAL | Status: DC
  Administered 2012-04-19 – 2012-04-22 (×8): 500 mg via ORAL
  Filled 2012-04-19 (×11): qty 1

## 2012-04-19 MED ORDER — SODIUM BICARBONATE 8.4 % IV SOLN
INTRAVENOUS | Status: DC
  Administered 2012-04-19: 19:00:00 via INTRAVENOUS
  Filled 2012-04-19 (×2): qty 150

## 2012-04-19 MED ORDER — VITAMIN B-1 100 MG PO TABS
100.0000 mg | ORAL_TABLET | Freq: Every day | ORAL | Status: DC
  Administered 2012-04-20 – 2012-04-22 (×3): 100 mg via ORAL
  Filled 2012-04-19 (×4): qty 1

## 2012-04-19 MED ORDER — POTASSIUM CHLORIDE IN NACL 20-0.9 MEQ/L-% IV SOLN
INTRAVENOUS | Status: DC
  Filled 2012-04-19 (×3): qty 1000

## 2012-04-19 NOTE — Consult Note (Signed)
Name: Madison [name] MRN: [id number] DOB: 02-Oct-1945    LOS: 1  Referring Provider:  Susie Cassette Reason for Referral:  hypotension  PULMONARY / CRITICAL CARE MEDICINE  HPI:    66 year old female w/ PMH squamous cell lung CA (stage IIIa, last xrt august 2012, last chemo December 2012, PET scan march 2013 reassuring w/ near complete resolution of mediastinal adenopathy), afib (no longer on coumadin), LV dysfxn (EF 40-45%), who was admitted on 9/11 w/ CC: weakness and diarrhea X 1 week, occasional fevers and chills. On ER eval found to be tachycardic but initially normotensive w/ scr of 6 and metabolic acidosis. She was admitted for IV hydration and evaluation of diarrhea. PCCM was asked to see the am of 9/12 for boarderline hypotension.   Past Medical History  Diagnosis Date  . Hypertension   . Anemia   . Throat cancer     lung ca  . Lung cancer 11/11/10  . Atrial fibrillation   . Chronic kidney disease    Past Surgical History  Procedure Date  . Port a cath placement    Prior to Admission medications   Medication Sig Start Date End Date Taking? Authorizing Provider  cholecalciferol (VITAMIN D) 1000 UNITS tablet Take 1,000 Units by mouth daily.     Yes Historical Provider, MD  diltiazem (CARDIZEM CD) 180 MG 24 hr capsule Take 180 mg by mouth daily.     Yes Historical Provider, MD  folic acid (FOLVITE) 1 MG tablet Take 1 mg by mouth daily.     Yes Historical Provider, MD  hydrochlorothiazide (HYDRODIURIL) 25 MG tablet Take 1 tablet (25 mg total) by mouth daily. 01/18/12 01/17/13 Yes Tonny Bollman, MD  iron polysaccharides (NIFEREX) 150 MG capsule Take 150 mg by mouth daily.   Yes Historical Provider, MD  lidocaine-prilocaine (EMLA) cream Apply 1 application topically as needed. For port-a-cath access.   Yes Historical Provider, MD  loratadine (CLARITIN) 10 MG tablet Take 10 mg by mouth daily.     Yes Historical Provider, MD  Multiple Vitamin (MULTIVITAMIN WITH MINERALS) TABS Take 1 tablet by  mouth daily.   Yes Historical Provider, MD  thiamine (VITAMIN B-1) 100 MG tablet Take 100 mg by mouth daily.  10/01/11  Yes Samul Dada, MD   Allergies Allergies  Allergen Reactions  . Lisinopril     Makes face swell    Family History History reviewed. No pertinent family history. Social History  reports that she quit smoking about 17 months ago. Her smoking use included Cigarettes. She has a 40 pack-year smoking history. She does not have any smokeless tobacco history on file. She reports that she does not drink alcohol. Her drug history not on file.  Review Of Systems:   Review of Systems  Constitutional: No weight loss, gain, night sweats, + Fevers, + chills, marked fatigue poor appetite .  HEENT: No headaches, visual changes, Difficulty swallowing, Tooth/dental problems, or Sore throat,  No sneezing, itching, ear ache, nasal congestion, post nasal drip, no visual complaints CV: No chest pain, Orthopnea, PND, swelling in lower extremities, dizziness, palpitations, syncope.  GI No heartburn, indigestion, +abdominal pain,+ nausea, +vomiting, +diarrhea, change in bowel habits,+ loss of appetite, -bloody stools.  Resp: No cough, No coughing up of blood. No change in color of mucus.+ wheezing.  Skin: no rash or itching or icterus GU: no dysuria, change in color of urine, no urgency or frequency. No flank pain, no hematuria  MS: No joint pain or swelling. No  decreased range of motion  Psych: No change in mood or affect. No depression or anxiety.  Neuro: no difficulty with speech, weakness, numbness, ataxia    Brief patient description:   66 year old female w/ PMH squamous cell lung CA (stage IIIa, last xrt august 2012, last chemo December 2012, PET scan march 2013 reassuring w/ near complete resolution of mediastinal adenopathy), afib (no longer on coumadin), LV dysfxn (EF 40-45%), who was admitted on 9/11 w/ CC: weakness and diarrhea X 1 week, occasional fevers and chills. On ER  eval found to be tachycardic but initially normotensive w/ scr of 6 and metabolic acidosis. She was admitted for IV hydration and evaluation of diarrhea. PCCM was asked to see the am of 9/12 for boarderline hypotension.   Events Since Admission: Moved to ICU   Current Status: guarded  Vital Signs: Temp:  [98.2 F (36.8 C)-102.1 F (38.9 C)] 98.2 F (36.8 C) (09/12 0603) Pulse Rate:  [94-115] 97  (09/12 0743) Resp:  [16-24] 22  (09/12 0603) BP: (99-140)/(61-77) 100/62 mmHg (09/12 0743) SpO2:  [94 %-100 %] 94 % (09/12 0603) Weight:  [64.3 kg (141 lb 12.1 oz)] 64.3 kg (141 lb 12.1 oz) (09/12 0500)  Physical Examination: General:  Well developed 66 year old femal. No acute distress.  Neuro:  Awake and oriented. No focal def  HEENT:  , no JVD Cardiovascular: rrr Lungs:  CTA. occ wheeze  Abdomen:  Soft, no OM, hyperactive BS Musculoskeletal:  intact Skin:  Intact   Principal Problem:  *SIRS (systemic inflammatory response syndrome) Active Problems:  Atrial fibrillation  ARF (acute renal failure)  Diarrhea  Metabolic acidosis  LV dysfunction   ASSESSMENT AND PLAN  PULMONARY  Lab 04/19/12 0252  PHART 7.381  PCO2ART 22.8*  PO2ART 82.4  HCO3 13.2*  O2SAT 94.5   Ventilator Settings:   CXR:  RLL atx vs scarring. Can't exclude effusion  ETT:    A:   1) H/o lung cancer. Last PET scan reassuring as far as progression (march 2013).   2) RLL atelectasis vs scarring. Can't exclude element of effusion. Actually better aerated when c/w prior film but now wheezing from time to time.  P:   Keep sats > 90% F/u cxr If becomes hypoxic may need central venous pressure to guide rx.   CARDIOVASCULAR  Lab 04/19/12 0607 04/19/12 0001  TROPONINI -- --  LATICACIDVEN 0.7 1.1  PROBNP -- --   ECG:  NSR-->ST Lines: port ?   A:  1) dehydration 2)SIRS/Sepsis  In setting of colitis vs gastroenteritis  P:  Cont IVFs Check lactate  Consider central access and CVP monitoring    RENAL  Lab 04/19/12 0607 04/18/12 2045  NA 137 133*  K 2.6* 3.2*  CL 100 90*  CO2 13* 15*  BUN 95* 101*  CREATININE 5.35* 6.29*  CALCIUM 6.5* 8.3*  MG -- --  PHOS -- --   Intake/Output      09/11 0701 - 09/12 0700 09/12 0701 - 09/13 0700   I.V. (mL/kg) 1015 (15.8)    IV Piggyback 300    Total Intake(mL/kg) 1315 (20.5)    Urine (mL/kg/hr) 150 (0.1) 100   Total Output 150 100   Net +1165 -100        Urine Occurrence 1 x     Foley:  9/12  A:   1) Metabolic acidosis + AG. her delta gap suggest mostly anion gap process, but likely mix of lactic acidosis from volume depletion and  Bicarb loss from diarrhea 2) Hypokalemia 3) acute renal failure   P:   Replace K Recheck chemistry after KCL, will likely need bicarb replacement  Close obs of chemistry  Cont IVFs   GASTROINTESTINAL  Lab 04/19/12 0607 04/18/12 2045  AST 77* 130*  ALT 40* 63*  ALKPHOS 69 86  BILITOT 0.5 0.8  PROT 5.3* 7.5  ALBUMIN 2.0* 2.9*   CT abd/pelvis 9/12>>> A:   1) diarrhea and abd pain 2) elevated LFTs already improved. Favor shock liver over acute cholecystitis   P:   Change imaging to CT abd/pelvis... Suspect she is sick from colitis vs gastroenteritis not GB disease    HEMATOLOGIC  Lab 04/19/12 0607 04/19/12 0001 04/18/12 2045  HGB 9.2* -- 11.4*  HCT 26.3* -- 31.9*  PLT 65* -- 81*  INR -- 1.15 --  APTT -- -- --   A:  Mild anemia  No evidence of bleeding  P:  Trend CBC  INFECTIOUS  Lab 04/19/12 0607 04/19/12 0001 04/18/12 2045  WBC 18.7* -- 16.3*  PROCALCITON -- 142.99 --   Cultures: Stool culture  cdiff  Antibiotics: cipro (gastroeneteritis) 9/11>>> flgayl (gastroentertitis) 9/11>>>  A:   1) Gastroenteritis vs c diff colitis  2) possible UTI  P:   Agree w/ current abx (see above) F/u culture data   ENDOCRINE No results found for this basename: GLUCAP:5 in the last 168 hours A:  hypoglycemia  P:   Add dextrose   NEUROLOGIC  A:  No acute issue  P:    Supportive care   BEST PRACTICE / DISPOSITION Level of Care:  sdu Primary Service:  [facility] Consultants:  pccm  Code Status:  Full  Diet:  Npo  DVT Px:  scd GI Px:  None  Skin Integrity:  intact Social / Family:  Updated   BABCOCK,PETE,   04/19/2012, 9:00 AM  Patient seen and examined, agree with above note.  I dictated the care and orders written for this patient under my direction.  Koren Bound, M.D. [phone]

## 2012-04-19 NOTE — Progress Notes (Signed)
CARE MANAGEMENT NOTE 04/19/2012  Patient:  Madison [name], Madison [name]   Account Number:  [id number]  Date Initiated:  04/19/2012  Documentation initiated by:  Zalaya Astarita  Subjective/Objective Assessment:   hx of ca , now sirs, a,fib, in step down for monitoring,temp 102.1,     Action/Plan:   from home   Anticipated DC Date:  04/22/2012   Anticipated DC Plan:  HOME/SELF CARE  In-house referral  NA      DC Planning Services  NA      [facility] Choice  NA   Choice offered to / List presented to:  NA   DME arranged  NA      DME agency  NA     HH arranged  NA      HH agency  NA   Status of service:  In process, will continue to follow Medicare Important Message given?  NA - LOS <3 / Initial given by admissions (If response is "NO", the following Medicare IM given date fields will be blank) Date Medicare IM given:   Date Additional Medicare IM given:    Discharge Disposition:    Per UR Regulation:  Reviewed for med. necessity/level of care/duration of stay  If discussed at Long Length of Stay Meetings, dates discussed:    Comments:  [id number] Earlene Plater, RN, BSN, CCM: CHART REVIEWED AND UPDATED. NO DISCHARGE NEEDS PRESENT AT THIS TIME. CASE MANAGEMENT [phone]

## 2012-04-19 NOTE — Progress Notes (Signed)
CRITICAL VALUE ALERT  Critical value received: K: 2.6  Date of notification:  04/19/2012  Time of notification:  0725  Critical value read back:yes  Nurse who received alert:  H. Earlene Plater  MD notified (1st page): Dr. Toniann Fail  Time of first page:    MD notified (2nd page):  Time of second page:  Responding MD:    Time MD responded:  Dr. Toniann Fail

## 2012-04-19 NOTE — Progress Notes (Signed)
[facility] PROGRESS NOTE  Madison [name] ZOX:[id number] DOB: 1946/06/24 DOA: 04/18/2012 PCP: [name], MD  Assessment/Plan: Principal Problem:  *SIRS (systemic inflammatory response syndrome) Active Problems:  Atrial fibrillation  ARF (acute renal failure)  Diarrhea  Metabolic acidosis  LV dysfunction  1. SIRS probably secondary to gastroenteritis - given patient's renal failure and tachycardia at this time patient is receiving her second liter of normal saline bolus after which we will continue with normal saline at 150 cc per hour. Patient does have a history of LV dysfunction closely follow respiratory status and oxygen saturation. Procalcitonin  significantly elevated, blood cultures urine cultures stool studies and C. difficile PCR pending. Empirically place patient on ciprofloxacin and Flagyl.   #2. Acute renal failure nonoliguric with non-anion gap metabolic acidosis secondary to diarrhea - most likely from dehydration. Aggressively hydrate. Closely follow intake output. Urine does not show any significant casts. Renal ultrasound shows no hydronephrosis  #3. Possible UTI - check urine cultures. Patient is on ciprofloxacin.  #4. Thrombocytopenia - possibly secondary to her infectious cause versus DIC. Given that patient also has renal failure we will check peripheral smear for any schistocytes. Closely follow CBC. Check INR.   #5. History of atrial fibrillation - monitor in telemetry. Check EKG. Patient is not on Coumadin was recently discontinued by cardiologist. Discontinue diltiazem because of low blood pressure  #6. History of hypertension - was on HCTZ will discontinue and hydrate. Discontinue diltiazem  #7. History of non-small cell lung cancer.  #8 hida  abnormal liver function had a scan has been ordered   Objective: Filed Vitals:   04/19/12 0504 04/19/12 0603 04/19/12 0627 04/19/12 0743  BP: 102/65 99/63 104/65 100/62  Pulse: 98 94 101 97  Temp:  98.2  F (36.8 C)    TempSrc:  Oral    Resp:  22    Height:      Weight:      SpO2:  94%      Intake/Output Summary (Last 24 hours) at 04/19/12 0752 Last data filed at 04/19/12 0743  Gross per 24 hour  Intake   1315 ml  Output    250 ml  Net   1065 ml    Exam:  HENT:  Head: Atraumatic.  Nose: Nose normal.  Mouth/Throat: Oropharynx is clear and moist.  Eyes: Conjunctivae are normal. Pupils are equal, round, and reactive to light. No scleral icterus.  Neck: Neck supple. No tracheal deviation present.  Cardiovascular: Normal rate, regular rhythm, normal heart sounds and intact distal pulses.  Pulmonary/Chest: Effort normal and breath sounds normal. No respiratory distress.  Abdominal: Soft. Normal appearance and bowel sounds are normal. She exhibits no distension. There is no tenderness.  Musculoskeletal: She exhibits no edema and no tenderness.  Neurological: She is alert. No cranial nerve deficit.    Data Reviewed: Basic Metabolic Panel:  Lab 04/19/12 8119 04/18/12 2045  NA 137 133*  K 2.6* 3.2*  CL 100 90*  CO2 13* 15*  GLUCOSE 88 110*  BUN 95* 101*  CREATININE 5.35* 6.29*  CALCIUM 6.5* 8.3*  MG -- --  PHOS -- --    Liver Function Tests:  Lab 04/19/12 0607 04/18/12 2045  AST 77* 130*  ALT 40* 63*  ALKPHOS 69 86  BILITOT 0.5 0.8  PROT 5.3* 7.5  ALBUMIN 2.0* 2.9*    Lab 04/19/12 0607  LIPASE 32  AMYLASE --   No results found for this basename: AMMONIA:5 in the last 168 hours  CBC:  Lab 04/19/12 0607 04/18/12 2045  WBC 18.7* 16.3*  NEUTROABS 16.6* 14.4*  HGB 9.2* 11.4*  HCT 26.3* 31.9*  MCV 98.1 97.6  PLT 65* 81*    Cardiac Enzymes: No results found for this basename: CKTOTAL:5,CKMB:5,CKMBINDEX:5,TROPONINI:5 in the last 168 hours BNP (last 3 results) No results found for this basename: PROBNP:3 in the last 8760 hours   CBG: No results found for this basename: GLUCAP:5 in the last 168 hours  No results found for this or any previous visit  (from the past 240 hour(s)).   Studies: US Renal  04/19/2012  *RADIOLOGY REPORT*  Clinical Data: Urinary tract infection.  Renal failure.  Increased creatinine.  History of lung cancer.  RENAL/URINARY TRACT ULTRASOUND COMPLETE  Comparison:  CT abdomen and pelvis 11/16/2010.  Findings:  Right Kidney:  Right kidney measures 12.2 cm length.  No hydronephrosis.  Normal parenchymal echotexture.  Left Kidney:  Left kidney measures 12.9 cm length.  No hydronephrosis.  Normal parenchymal echotexture.  Bladder:  The bladder is decompressed.  Limited visualization.  Incidental note of stones and sludge in the gallbladder without gallbladder wall thickening or edema.  No bile duct dilatation.  IMPRESSION: Normal ultrasound appearance of the kidneys.  Decompressed bladder limits visualization.  Incidental note of cholelithiasis with sludge in the gallbladder.   Original Report Authenticated By: Marlon Pel, M.D.    Dg Chest Port 1 View  04/19/2012  *RADIOLOGY REPORT*  Clinical Data: Fever.  PORTABLE CHEST - 1 VIEW  Comparison: 11/25/2010  Findings: Shallow inspiration.  Normal heart size and pulmonary vascularity.  Mass suggested in the right hilum with consolidation in the right mid and lower lung region.  Diffuse interstitial fibrosis in the lungs.  No blunting of costophrenic angles.  No pneumothorax.  There is improvement since previous study with no new infiltrates identified.  Interval placement of a power port type right central venous catheter with tip over the low SVC region.  IMPRESSION: Right hilar mass with right mid and lower lung consolidation. Diffuse interstitial changes.  Improved parenchymal infiltrates and effusions since previous study.   Original Report Authenticated By: Marlon Pel, M.D.     Scheduled Meds:   . sodium chloride   Intravenous STAT  . cefTRIAXone (ROCEPHIN)  IV  1 g Intravenous Once  . ciprofloxacin  400 mg Intravenous Q24H  . folic acid  1 mg Oral Daily  .  metronidazole  500 mg Intravenous Q8H  . potassium chloride  10 mEq Intravenous Q1 Hr x 3  . sodium chloride  1,000 mL Intravenous Once  . sodium chloride  1,000 mL Intravenous Once  . sodium chloride  1,000 mL Intravenous Once  . sodium chloride  3,000 mL Intravenous Once  . sodium chloride  3 mL Intravenous Q12H  . thiamine  100 mg Oral Daily  . vitamin A & D      . DISCONTD: diltiazem  180 mg Oral Daily   Continuous Infusions:   . 0.9 % NaCl with KCl 20 mEq / L    . DISCONTD: sodium chloride 1,000 mL (04/19/12 0014)    Principal Problem:  *SIRS (systemic inflammatory response syndrome) Active Problems:  Atrial fibrillation  ARF (acute renal failure)  Diarrhea  Metabolic acidosis  LV dysfunction    Time spent: 40 minutes   [facility]  [facility] Pager [phone]. If 8PM-8AM, please contact night-coverage at [email or web address], password [facility] 04/19/2012, 7:52 AM  LOS: 1 day

## 2012-04-19 NOTE — Progress Notes (Signed)
Pt had several beats of SVT throughout the night, notified MD on call. New orders given and carried out. Will continue to monitor pt.

## 2012-04-19 NOTE — Progress Notes (Addendum)
Patient seen and examined Transferred to step down Blood pressure still soft after receiving 3 L of normal saline, discontinue diltiazem  ECC and consulted ABG consistent with metabolic acidosis probably secondary to diarrhea

## 2012-04-19 NOTE — Progress Notes (Signed)
INITIAL ADULT NUTRITION ASSESSMENT Date: 04/19/2012   Time: 11:50 AM Reason for Assessment: Nutrition Risk  ASSESSMENT: Female 66 y.o.  Dx: SIRS (systemic inflammatory response syndrome), colitis  Hx:  Past Medical History  Diagnosis Date  . Hypertension   . Anemia   . Throat cancer     lung ca  . Lung cancer 11/11/10  . Atrial fibrillation   . Chronic kidney disease    Past Surgical History  Procedure Date  . Port a cath placement     Related Meds:     . sodium chloride   Intravenous STAT  . cefTRIAXone (ROCEPHIN)  IV  1 g Intravenous Once  . ciprofloxacin  400 mg Intravenous Q24H  . folic acid  1 mg Oral Daily  . metronidazole  500 mg Intravenous Q8H  . potassium chloride  10 mEq Intravenous Q1 Hr x 3  . sodium chloride  1,000 mL Intravenous Once  . sodium chloride  1,000 mL Intravenous Once  . sodium chloride  1,000 mL Intravenous Once  . sodium chloride  3,000 mL Intravenous Once  . sodium chloride  3 mL Intravenous Q12H  . thiamine  100 mg Oral Daily  . vitamin A & D      . DISCONTD: diltiazem  180 mg Oral Daily     Ht: 5\' 6"  (167.6 cm) Wt: 141 lb 12.1 oz (64.3 kg)  Ideal Wt: 59.3 kg  % Ideal Wt: 108  Usual Wt:  Wt Readings from Last 10 Encounters:  04/19/12 141 lb 12.1 oz (64.3 kg)  03/05/12 144 lb 8 oz (65.545 kg)  01/18/12 143 lb (64.864 kg)  12/30/11 141 lb 12.8 oz (64.32 kg)  12/02/11 138 lb 12.8 oz (62.959 kg)  11/04/11 137 lb 6.4 oz (62.324 kg)  09/22/11 132 lb 6.4 oz (60.056 kg)  08/16/11 133 lb (60.328 kg)  07/14/11 127 lb (57.607 kg)  06/23/11 129 lb 8 oz (58.741 kg)    % Usual Wt: 110  Body mass index is 22.88 kg/(m^2).   Labs:  CMP     Component Value Date/Time   NA 137 04/19/2012 0607   K 2.6* 04/19/2012 0607   CL 100 04/19/2012 0607   CO2 13* 04/19/2012 0607   GLUCOSE 88 04/19/2012 0607   BUN 95* 04/19/2012 0607   CREATININE 5.35* 04/19/2012 0607   CREATININE 1.55* 01/27/2012 0921   CALCIUM 6.5* 04/19/2012 0607   PROT 5.3*  04/19/2012 0607   ALBUMIN 2.0* 04/19/2012 0607   AST 77* 04/19/2012 0607   ALT 40* 04/19/2012 0607   ALKPHOS 69 04/19/2012 0607   BILITOT 0.5 04/19/2012 0607   GFRNONAA 8* 04/19/2012 0607   GFRAA 9* 04/19/2012 0607    I/O last 3 completed shifts: In: 1315 [I.V.:1015; IV Piggyback:300] Out: 150 [Urine:150] Total I/O In: -  Out: 100 [Urine:100]   Diet Order: NPO  Supplements/Tube Feeding:  none IVF:    0.9 % NaCl with KCl 20 mEq / L   dextrose 5 % and 0.9% NaCl Last Rate: 125 mL/hr at 04/19/12 1105  DISCONTD: sodium chloride Last Rate: 1,000 mL (04/19/12 0014)    Estimated Nutritional Needs:   Kcal: 1600-1700 Protein: 70-80 gm Fluid: .1.6L Food/Nutrition Related Hx: Pt reports eating regular diet well until 3 days ago. Intake since has been poor.  Pt currently NPO and prepping for CT scan.  Pt has gained weight in the last year.  NUTRITION DIAGNOSIS: -Inadequate oral intake (NI-2.1).  Status: Ongoing  RELATED TO: decreased appetite  AS EVIDENCE BY: pt report  MONITORING/EVALUATION(Goals): Goal:  Tolerance of meals and intake >75%. Monitor:  Diet advancement, intake, weight, labs.  EDUCATION NEEDS: -No education needs identified at this time  INTERVENTION: Monitor for supplement needs when diet advanced.  (likes Chocolate Ensure)  Dietitian [phone]  DOCUMENTATION CODES Per approved criteria  -Not Applicable    Jeoffrey Massed 04/19/2012, 11:50 AM

## 2012-04-20 ENCOUNTER — Inpatient Hospital Stay (HOSPITAL_COMMUNITY): Payer: Medicare Other

## 2012-04-20 DIAGNOSIS — R197 Diarrhea, unspecified: Secondary | ICD-10-CM

## 2012-04-20 DIAGNOSIS — E860 Dehydration: Secondary | ICD-10-CM

## 2012-04-20 DIAGNOSIS — N390 Urinary tract infection, site not specified: Secondary | ICD-10-CM

## 2012-04-20 DIAGNOSIS — R6510 Systemic inflammatory response syndrome (SIRS) of non-infectious origin without acute organ dysfunction: Secondary | ICD-10-CM

## 2012-04-20 LAB — HEPATIC FUNCTION PANEL
ALT: 44 U/L — ABNORMAL HIGH (ref 0–35)
Alkaline Phosphatase: 81 U/L (ref 39–117)
Bilirubin, Direct: 0.2 mg/dL (ref 0.0–0.3)
Indirect Bilirubin: 0.3 mg/dL (ref 0.3–0.9)
Total Bilirubin: 0.5 mg/dL (ref 0.3–1.2)
Total Protein: 5.9 g/dL — ABNORMAL LOW (ref 6.0–8.3)

## 2012-04-20 LAB — BASIC METABOLIC PANEL
BUN: 91 mg/dL — ABNORMAL HIGH (ref 6–23)
CO2: 15 mEq/L — ABNORMAL LOW (ref 19–32)
CO2: 18 mEq/L — ABNORMAL LOW (ref 19–32)
Calcium: 6.8 mg/dL — ABNORMAL LOW (ref 8.4–10.5)
Calcium: 7 mg/dL — ABNORMAL LOW (ref 8.4–10.5)
Chloride: 104 mEq/L (ref 96–112)
Creatinine, Ser: 5.15 mg/dL — ABNORMAL HIGH (ref 0.50–1.10)
Creatinine, Ser: 4.98 mg/dL — ABNORMAL HIGH (ref 0.50–1.10)
GFR calc Af Amer: 9 mL/min — ABNORMAL LOW (ref >90)
GFR calc Af Amer: 9 mL/min — ABNORMAL LOW (ref >90)
GFR calc non Af Amer: 8 mL/min — ABNORMAL LOW (ref >90)
GFR calc non Af Amer: 8 mL/min — ABNORMAL LOW (ref >90)
Glucose, Bld: 111 mg/dL — ABNORMAL HIGH (ref 70–99)
Potassium: 2.9 mEq/L — ABNORMAL LOW (ref 3.5–5.1)
Sodium: 138 mEq/L (ref 135–145)
Sodium: 140 mEq/L (ref 135–145)

## 2012-04-20 LAB — CBC
HCT: 32 % — ABNORMAL LOW (ref 36.0–46.0)
Hemoglobin: 11.2 g/dL — ABNORMAL LOW (ref 12.0–15.0)
MCH: 33.9 pg (ref 26.0–34.0)
MCHC: 35 g/dL (ref 30.0–36.0)
RBC: 3.3 MIL/uL — ABNORMAL LOW (ref 3.87–5.11)

## 2012-04-20 LAB — TROPONIN I: Troponin I: <0.3 ng/mL (ref <0.30)

## 2012-04-20 LAB — GLUCOSE, CAPILLARY
Glucose-Capillary: 100 mg/dL — ABNORMAL HIGH (ref 70–99)
Glucose-Capillary: 111 mg/dL — ABNORMAL HIGH (ref 70–99)
Glucose-Capillary: 123 mg/dL — ABNORMAL HIGH (ref 70–99)

## 2012-04-20 MED ORDER — POTASSIUM CHLORIDE 10 MEQ/100ML IV SOLN
10.0000 meq | INTRAVENOUS | Status: AC
  Administered 2012-04-21 (×2): 10 meq via INTRAVENOUS
  Filled 2012-04-20: qty 200

## 2012-04-20 MED ORDER — POTASSIUM CHLORIDE CRYS ER 20 MEQ PO TBCR
30.0000 meq | EXTENDED_RELEASE_TABLET | Freq: Two times a day (BID) | ORAL | Status: AC
  Administered 2012-04-20 (×2): 30 meq via ORAL
  Filled 2012-04-20 (×2): qty 1

## 2012-04-20 MED ORDER — ENSURE COMPLETE PO LIQD
237.0000 mL | Freq: Two times a day (BID) | ORAL | Status: DC
  Administered 2012-04-23 – 2012-04-25 (×2): 237 mL via ORAL

## 2012-04-20 MED ORDER — POTASSIUM CHLORIDE 10 MEQ/100ML IV SOLN
10.0000 meq | INTRAVENOUS | Status: AC
  Administered 2012-04-20 (×2): 10 meq via INTRAVENOUS
  Filled 2012-04-20: qty 200

## 2012-04-20 MED ORDER — STERILE WATER FOR INJECTION IV SOLN
INTRAVENOUS | Status: DC
  Administered 2012-04-20 – 2012-04-21 (×2): via INTRAVENOUS
  Filled 2012-04-20 (×4): qty 9.7

## 2012-04-20 MED ORDER — POTASSIUM CHLORIDE IN NACL 20-0.9 MEQ/L-% IV SOLN: INTRAVENOUS | Status: DC

## 2012-04-20 MED ORDER — ALTEPLASE 2 MG IJ SOLR
2.0000 mg | Freq: Once | INTRAMUSCULAR | Status: AC
  Administered 2012-04-20: 2 mg
  Filled 2012-04-20: qty 2

## 2012-04-20 NOTE — Progress Notes (Signed)
eLink Physician-Brief Progress Note Patient Name: Madison [name] DOB: [birth year] MRN: [id number]  Date of Service  04/20/2012   HPI/Events of Note   Low k , in settgin arf  eICU Interventions  k   Intervention Category Major Interventions: Electrolyte abnormality - evaluation and management  FEINSTEIN,DANIEL J. 04/20/2012, 7:00 AM

## 2012-04-20 NOTE — Progress Notes (Signed)
Brief nutrition note  Pt diet has been advanced. Chocolate Ensure nutrition supplement added BID, provides 500 kcal and 18 grams of protien daily.   RD available for nutrition needs.   Iven Finn [facility] [phone]

## 2012-04-20 NOTE — Progress Notes (Signed)
[facility] PROGRESS NOTE  Madison [name] ZOX:[id number] DOB: 1946-03-07 DOA: 04/18/2012 PCP: [name], MD  Assessment/Plan: Principal Problem:  *SIRS (systemic inflammatory response syndrome) Active Problems:  Atrial fibrillation  ARF (acute renal failure)  Diarrhea  Metabolic acidosis  LV dysfunction  Dehydration     1.  diarrhea C. difficile colitis continue oral Flagyl 2. UTI continue ciprofloxacin 3. Dehydration/acute renal failure/likely prerenal etiology. Improving, Continue IV hydration with bicarbonate 4. Hypokalemia replete, secondary to diarrhea 5. Anion gap metabolic acidosis mixture of lactic acidosis from one end of each and bicarbonate loss from diarrhea, replace bicarbonate 6. Abnormal liver function tests hida scan canceled for now, follow liver function tests 7. Atrial fibrillation rate controlled 8. SIRS follow cultures blood cultures negative thus far likely secondary to C. difficile colitis 9. History of lung cancer currently stable 10. Hypertension holding antihypertensive medications  Code Status: full Family Communication: family updated about patient's clinical progress Disposition Plan:  As above    Brief narrative: 66 year old female w/ PMH squamous cell lung CA (stage IIIa, last xrt august 2012, last chemo December 2012, PET scan march 2013 reassuring w/ near complete resolution of mediastinal adenopathy), afib (no longer on coumadin), LV dysfxn (EF 40-45%), who was admitted on 9/11 w/ CC: weakness and diarrhea X 1 week, occasional fevers and chills. On ER eval found to be tachycardic but initially normotensive w/ scr of 6 and metabolic acidosis. She was admitted for IV hydration and evaluation of diarrhea.   Consultants: PULMONARY / CRITICAL CARE MEDICINE   Procedures:  None  Antibiotics:  Ciprofloxacin and Flagyl  HPI/Subjective: Good urine output overnight about 1750 cc Hemodynamically stable  Objective: Filed Vitals:     04/20/12 0300 04/20/12 0400 04/20/12 0638 04/20/12 0800  BP: 122/67 120/63 122/71   Pulse: 97 85 98   Temp: 98.7 F (37.1 C)   98.4 F (36.9 C)  TempSrc: Oral   Oral  Resp: 26 27 25    Height:      Weight:      SpO2: 98% 97% 99%     Intake/Output Summary (Last 24 hours) at 04/20/12 0825 Last data filed at 04/20/12 0500  Gross per 24 hour  Intake 339.58 ml  Output   1650 ml  Net -1310.42 ml    Exam:  HENT:  Head: Atraumatic.  Nose: Nose normal.  Mouth/Throat: Oropharynx is clear and moist.  Eyes: Conjunctivae are normal. Pupils are equal, round, and reactive to light. No scleral icterus.  Neck: Neck supple. No tracheal deviation present.  Cardiovascular: Normal rate, regular rhythm, normal heart sounds and intact distal pulses.  Pulmonary/Chest: Effort normal and breath sounds normal. No respiratory distress.  Abdominal: Soft. Normal appearance and bowel sounds are normal. She exhibits no distension. There is no tenderness.  Musculoskeletal: She exhibits no edema and no tenderness.  Neurological: She is alert. No cranial nerve deficit.    Data Reviewed: Basic Metabolic Panel:  Lab 04/20/12 8119 04/19/12 1340 04/19/12 0607 04/18/12 2045  NA 138 136 137 133*  K 2.9* 3.4* 2.6* 3.2*  CL 104 101 100 90*  CO2 15* 13* 13* 15*  GLUCOSE 111* 82 88 110*  BUN 91* 93* 95* 101*  CREATININE 5.15* 5.31* 5.35* 6.29*  CALCIUM 6.8* 6.9* 6.5* 8.3*  MG -- -- -- --  PHOS -- -- -- --    Liver Function Tests:  Lab 04/19/12 0607 04/18/12 2045  AST 77* 130*  ALT 40* 63*  ALKPHOS 69 86  BILITOT 0.5  0.8  PROT 5.3* 7.5  ALBUMIN 2.0* 2.9*    Lab 04/19/12 9604  LIPASE 32  AMYLASE --   No results found for this basename: AMMONIA:5 in the last 168 hours  CBC:  Lab 04/20/12 0520 04/19/12 1340 04/19/12 0607 04/18/12 2045  WBC 12.8* 17.9* 18.7* 16.3*  NEUTROABS -- -- 16.6* 14.4*  HGB 11.2* 10.4* 9.2* 11.4*  HCT 32.0* 29.7* 26.3* 31.9*  MCV 97.0 99.3 98.1 97.6  PLT 62* 67*  65* 81*    Cardiac Enzymes:  Lab 04/20/12 0520 04/19/12 2236 04/19/12 1340  CKTOTAL -- -- --  CKMB -- -- --  CKMBINDEX -- -- --  TROPONINI <0.30 <0.30 <0.30   BNP (last 3 results) No results found for this basename: PROBNP:3 in the last 8760 hours   CBG:  Lab 04/20/12 0746 04/20/12 0408 04/20/12 0015 04/19/12 2119 04/19/12 1133  GLUCAP 123* 111* 100* 107* 77    Recent Results (from the past 240 hour(s))  URINE CULTURE     Status: Normal (Preliminary result)   Collection Time   04/18/12 10:03 PM      Component Value Range Status Comment   Specimen Description URINE, CLEAN CATCH   Final    Special Requests NONE   Final    Culture  Setup Time 04/19/2012 06:02   Final    Colony Count PENDING   Incomplete    Culture Culture reincubated for better growth   Final    Report Status PENDING   Incomplete   CULTURE, BLOOD (ROUTINE X 2)     Status: Normal (Preliminary result)   Collection Time   04/19/12 12:01 AM      Component Value Range Status Comment   Specimen Description BLOOD PORTA CATH RIGHT   Final    Special Requests BOTTLES DRAWN AEROBIC AND ANAEROBIC UNKNOWN   Final    Culture  Setup Time 04/19/2012 04:05   Final    Culture     Final    Value:        BLOOD CULTURE RECEIVED NO GROWTH TO DATE CULTURE WILL BE HELD FOR 5 DAYS BEFORE ISSUING A FINAL NEGATIVE REPORT   Report Status PENDING   Incomplete   CULTURE, BLOOD (ROUTINE X 2)     Status: Normal (Preliminary result)   Collection Time   04/19/12 12:45 AM      Component Value Range Status Comment   Specimen Description BLOOD RIGHT HAND   Final    Special Requests BOTTLES DRAWN AEROBIC ONLY 2CC   Final    Culture  Setup Time 04/19/2012 04:06   Final    Culture     Final    Value:        BLOOD CULTURE RECEIVED NO GROWTH TO DATE CULTURE WILL BE HELD FOR 5 DAYS BEFORE ISSUING A FINAL NEGATIVE REPORT   Report Status PENDING   Incomplete   MRSA PCR SCREENING     Status: Normal   Collection Time   04/19/12  9:00 AM       Component Value Range Status Comment   MRSA by PCR NEGATIVE  NEGATIVE Final   CLOSTRIDIUM DIFFICILE BY PCR     Status: Abnormal   Collection Time   04/19/12  9:45 AM      Component Value Range Status Comment   C difficile by pcr POSITIVE (*) NEGATIVE Final      Studies: Ct Abdomen Pelvis Wo Contrast  04/19/2012  *RADIOLOGY REPORT*  Clinical Data: Suspected abdominal sepsis. History  of lung cancer. Elevated liver functions.  Elevated renal function.  Elevated white count.  CT ABDOMEN AND PELVIS WITHOUT CONTRAST  Technique:  Multidetector CT imaging of the abdomen and pelvis was performed following the standard protocol without intravenous contrast.  Comparison: Prior PET scan 10/26/2011.  Prior CT abdomen pelvis for and 2012.  Findings: Left greater than right pleural effusions.  Mild bibasilar atelectasis. Right hilar mass incompletely evaluated. Central venous catheter.  Unenhanced appearance of the liver shows no focal defects or dilated ducts.  Gallstones are seen in the gallbladder without wall thickening or pericholecystic fluid.  The right kidney is slightly enlarged with perinephric stranding. The left kidney is moderately enlarged with more perinephric stranding. Renal size has increased bilaterally, particularly the left, compared with prior cross-sectional imaging studies.  There is no visible renal mass or hydronephrosis.  Concern is raised for bilateral pyelonephritis, worse on the left.  Normal pancreas, adrenal glands, stomach, small bowel, and colon. Diverticulosis without diverticulitis.  No bowel obstruction.  Free pelvic fluid. Unremarkable uterus.  uterus.  No adnexal mass. Bladder decompressed by Foley catheter.  No worrisome osseous lesions.  Nonaneurysmal atherosclerotic calcification of the aorta.  IMPRESSION: Bilateral renal enlargement, left greater than right, with perinephric stranding.  Concern raised for pyelonephritis.  Free intra-abdominal fluid without free air.  No evidence  for bowel obstruction or perforation.  Bilateral pleural effusions with right infrahilar opacity, likely mass. This could be slightly worse compared with previous PET scan.   Original Report Authenticated By: Elsie Stain, M.D.    US Renal  04/19/2012  *RADIOLOGY REPORT*  Clinical Data: Urinary tract infection.  Renal failure.  Increased creatinine.  History of lung cancer.  RENAL/URINARY TRACT ULTRASOUND COMPLETE  Comparison:  CT abdomen and pelvis 11/16/2010.  Findings:  Right Kidney:  Right kidney measures 12.2 cm length.  No hydronephrosis.  Normal parenchymal echotexture.  Left Kidney:  Left kidney measures 12.9 cm length.  No hydronephrosis.  Normal parenchymal echotexture.  Bladder:  The bladder is decompressed.  Limited visualization.  Incidental note of stones and sludge in the gallbladder without gallbladder wall thickening or edema.  No bile duct dilatation.  IMPRESSION: Normal ultrasound appearance of the kidneys.  Decompressed bladder limits visualization.  Incidental note of cholelithiasis with sludge in the gallbladder.   Original Report Authenticated By: Marlon Pel, M.D.    Dg Chest Port 1 View  04/20/2012  *RADIOLOGY REPORT*  Clinical Data: Infiltrates, possible pneumonia  PORTABLE CHEST - 1 VIEW  Comparison: Portable chest x-ray of 04/19/2012  Findings: There is more opacity at both lung bases which may reflect atelectasis or possibly pneumonia.  Also, there may be a small left effusion present.  Heart size is stable.  Right Port-A- Cath is unchanged in position.  IMPRESSION: Increase in basilar opacities left greater than right.  Cannot exclude pneumonia with possible left effusion.   Original Report Authenticated By: Juline Patch, M.D.    Dg Chest Port 1 View  04/19/2012  *RADIOLOGY REPORT*  Clinical Data: Fever.  PORTABLE CHEST - 1 VIEW  Comparison: 11/25/2010  Findings: Shallow inspiration.  Normal heart size and pulmonary vascularity.  Mass suggested in the right hilum with  consolidation in the right mid and lower lung region.  Diffuse interstitial fibrosis in the lungs.  No blunting of costophrenic angles.  No pneumothorax.  There is improvement since previous study with no new infiltrates identified.  Interval placement of a power port type right central venous catheter with  tip over the low SVC region.  IMPRESSION: Right hilar mass with right mid and lower lung consolidation. Diffuse interstitial changes.  Improved parenchymal infiltrates and effusions since previous study.   Original Report Authenticated By: Marlon Pel, M.D.     Scheduled Meds:   . sodium chloride   Intravenous STAT  . alteplase  2 mg Intracatheter Once  . ciprofloxacin  400 mg Intravenous Q24H  . folic acid  1 mg Oral Daily  . metroNIDAZOLE  500 mg Oral Q8H  . potassium chloride  30 mEq Oral BID  . potassium chloride  10 mEq Intravenous Q1 Hr x 3  . potassium chloride  10 mEq Intravenous Q1 Hr x 2  . sodium chloride  3 mL Intravenous Q12H  . thiamine  100 mg Oral Daily  . DISCONTD: metronidazole  500 mg Intravenous Q8H  . DISCONTD: potassium chloride  40 mEq Oral Q4H  . DISCONTD: potassium chloride  40 mEq Oral BID   Continuous Infusions:   .  sodium bicarbonate infusion 1/4 NS 1000 mL    . DISCONTD: 0.9 % NaCl with KCl 20 mEq / L    . DISCONTD: 0.9 % NaCl with KCl 20 mEq / L    . DISCONTD: dextrose 5 % and 0.9% NaCl 125 mL/hr at 04/19/12 1105  . DISCONTD:  sodium bicarbonate infusion 1000 mL 75 mL/hr at 04/20/12 0700    Principal Problem:  *SIRS (systemic inflammatory response syndrome) Active Problems:  Atrial fibrillation  ARF (acute renal failure)  Diarrhea  Metabolic acidosis  LV dysfunction  Dehydration    Time spent: 40 minutes   [facility]  [facility] Pager [phone]. If 8PM-8AM, please contact night-coverage at [email or web address], password [facility] 04/20/2012, 8:25 AM  LOS: 2 days

## 2012-04-20 NOTE — Progress Notes (Signed)
Name: Madison [name] MRN: [id number] DOB: [birth year]    LOS: 2  Referring Provider:  Susie Cassette Reason for Referral:  hypotension  PULMONARY / CRITICAL CARE MEDICINE   Brief patient description:   66 year old female w/ PMH squamous cell lung CA (stage IIIa, last xrt august 2012, last chemo December 2012, PET scan march 2013 reassuring w/ near complete resolution of mediastinal adenopathy), afib (no longer on coumadin), LV dysfxn (EF 40-45%), who was admitted on 9/11 w/ CC: weakness and diarrhea X 1 week, occasional fevers and chills. On ER eval found to be tachycardic but initially normotensive w/ scr of 6 and metabolic acidosis. She was admitted for IV hydration and evaluation of diarrhea. PCCM was asked to see the am of 9/12 for boarderline hypotension.   Events Since Admission: Moved to ICU   Current Status: guarded  Vital Signs: Temp:  [97.3 F (36.3 C)-99.4 F (37.4 C)] 98.4 F (36.9 C) (09/13 0800) Pulse Rate:  [59-107] 92  (09/13 0900) Resp:  [22-29] 26  (09/13 0900) BP: (114-129)/(54-83) 125/78 mmHg (09/13 0800) SpO2:  [97 %-100 %] 99 % (09/13 0900) Weight:  [69.5 kg (153 lb 3.5 oz)] 69.5 kg (153 lb 3.5 oz) (09/13 0000) Room air   Physical Examination: General:  Well developed 66 year old femal. No acute distress.  Neuro:  Awake and oriented. No focal def  HEENT:  [place], no JVD Cardiovascular: rrr Lungs:  CTA. occ wheeze  Abdomen:  Soft, no OM, hyperactive BS, non-tender  Musculoskeletal:  intact Skin:  Intact   Principal Problem:  *SIRS (systemic inflammatory response syndrome) Active Problems:  Atrial fibrillation  ARF (acute renal failure)  Diarrhea  Metabolic acidosis  LV dysfunction  Dehydration   ASSESSMENT AND PLAN  PULMONARY  Lab 04/19/12 0252  PHART 7.381  PCO2ART 22.8*  PO2ART 82.4  HCO3 13.2*  O2SAT 94.5   Ventilator Settings:   CXR:  Increased RLL ATX. Overall a little worse aeration  ETT:    A:   1) H/o lung cancer. Last PET scan  reassuring as far as progression (march 2013).   2) RLL atelectasis vs scarring. Can't exclude element of effusion. Actually better aerated when c/w prior film but now wheezing from time to time.  P:   Keep sats > 90% Add pulm hygiene Keep even at this point   CARDIOVASCULAR  Lab 04/20/12 0520 04/19/12 2236 04/19/12 1340 04/19/12 1335 04/19/12 0607 04/19/12 0001  TROPONINI <0.30 <0.30 <0.30 -- -- --  LATICACIDVEN -- -- -- 1.1 0.7 1.1  PROBNP -- -- -- -- -- --   ECG:  NSR-->ST Lines: port ?   A:  1) dehydration 2)SIRS/Sepsis  In setting of c-diff P:  Cont IVFs   RENAL  Lab 04/20/12 0520 04/19/12 1340 04/19/12 0607 04/18/12 2045  NA 138 136 137 133*  K 2.9* 3.4* -- --  CL 104 101 100 90*  CO2 15* 13* 13* 15*  BUN 91* 93* 95* 101*  CREATININE 5.15* 5.31* 5.35* 6.29*  CALCIUM 6.8* 6.9* 6.5* 8.3*  MG -- -- -- --  PHOS -- -- -- --   Intake/Output      09/12 0701 - 09/13 0700 09/13 0701 - 09/14 0700   I.V. (mL/kg) 1163.3 (16.7) 75 (1.1)   IV Piggyback 100 100   Total Intake(mL/kg) 1263.3 (18.2) 175 (2.5)   Urine (mL/kg/hr) 1750 (1) 175   Total Output 1750 175   Net -486.7 0        Stool  Occurrence 10 x     Foley:  9/12  Intake/Output Summary (Last 24 hours) at 04/20/12 1029 Last data filed at 04/20/12 0844  Gross per 24 hour  Intake 1338.33 ml  Output   1825 ml  Net -486.67 ml   A:   1) Metabolic acidosis + AG and NAG process. Mostly due to bicarb loss from diarrhea and renal injury  2) Hypokalemia: prob exacerbated by bicarb 3) acute renal failure: d/t dehydration. Slowly improving.   P:   Replace K Recheck chemistry after K  Close obs of chemistry  Cont IVFs   GASTROINTESTINAL  Lab 04/20/12 0520 04/19/12 0607 04/18/12 2045  AST 67* 77* 130*  ALT 44* 40* 63*  ALKPHOS 81 69 86  BILITOT 0.5 0.5 0.8  PROT 5.9* 5.3* 7.5  ALBUMIN 2.0* 2.0* 2.9*   CT abd/pelvis 9/12>>>Bilateral renal enlargement, left greater than right, with  perinephric stranding.  Concern raised for pyelonephritis. Free intra-abdominal fluid without free air. No evidence for bowel  obstruction or perforation. Bilateral pleural effusions with right infrahilar opacity, likely mass. This could be slightly worse compared with previous PET scan.  A:   1) diarrhea and abd pain + C diff colitis: pain resolving.  2) elevated LFTs already improved. Favor shock liver over acute cholecystitis, LFTs rapidly improved.  P:   Cont flagyl  Slow advance diet   HEMATOLOGIC  Lab 04/20/12 0520 04/19/12 1340 04/19/12 0607 04/19/12 0001 04/18/12 2045  HGB 11.2* 10.4* 9.2* -- 11.4*  HCT 32.0* 29.7* 26.3* -- 31.9*  PLT 62* 67* 65* -- 81*  INR -- -- -- 1.15 --  APTT -- -- -- -- --   A:  Mild anemia  No evidence of bleeding  P:  Trend CBC  INFECTIOUS  Lab 04/20/12 0520 04/19/12 1340 04/19/12 0607 04/19/12 0001 04/18/12 2045  WBC 12.8* 17.9* 18.7* -- 16.3*  PROCALCITON 121.95 -- -- 142.99 --   Cultures: Stool culture  cdiff  9/12: positive   Antibiotics: cipro (gastroeneteritis) 9/11>>> Flagyl (gastroentertitis) 9/11>>>  A:   1) C diff Colitis  2) possible UTI  P:   Agree w/ current abx (see above) F/u culture data   ENDOCRINE  Lab 04/20/12 0746 04/20/12 0408 04/20/12 0015 04/19/12 2119 04/19/12 1133  GLUCAP 123* 111* 100* 107* 77   A:  hypoglycemia  P:   Advance diet   NEUROLOGIC  A:  No acute issue  P:   Supportive care   BEST PRACTICE / DISPOSITION Level of Care:  sdu Primary Service:  [facility]:  pccm -->we will s/o today  Code Status:  Full  Diet:  Npo  DVT Px:  scd GI Px:  None  Skin Integrity:  intact Social / Family:  Updated   BABCOCK,PETE,   04/20/2012, 9:04 AM  PCCM signing off, please call back if needed.  Patient seen and examined, agree with above note.  I dictated the care and orders written for this patient under my direction.  Koren Bound, M.D. [phone]

## 2012-04-20 NOTE — Clinical Social Work Note (Signed)
Clinical Social Work met briefly with patient at bedside today to provide emotional support. CSW has previously worked with patient and spouse at [facility] [employer].  Patient has no CSW needs at this time.  Please consult as any needs arise.  Kathrin Penner, MSW, LCSW Clinical Social Worker [facility] [phone]

## 2012-04-21 LAB — BASIC METABOLIC PANEL
BUN: 91 mg/dL — ABNORMAL HIGH (ref 6–23)
Chloride: 104 mEq/L (ref 96–112)
Creatinine, Ser: 4.74 mg/dL — ABNORMAL HIGH (ref 0.50–1.10)
Glucose, Bld: 106 mg/dL — ABNORMAL HIGH (ref 70–99)
Potassium: 3.5 mEq/L (ref 3.5–5.1)

## 2012-04-21 LAB — GLUCOSE, CAPILLARY
Glucose-Capillary: 111 mg/dL — ABNORMAL HIGH (ref 70–99)
Glucose-Capillary: 121 mg/dL — ABNORMAL HIGH (ref 70–99)
Glucose-Capillary: 134 mg/dL — ABNORMAL HIGH (ref 70–99)
Glucose-Capillary: 142 mg/dL — ABNORMAL HIGH (ref 70–99)
Glucose-Capillary: 90 mg/dL (ref 70–99)
Glucose-Capillary: 93 mg/dL (ref 70–99)
Glucose-Capillary: 97 mg/dL (ref 70–99)

## 2012-04-21 LAB — TROPONIN I
Troponin I: <0.3 ng/mL (ref <0.30)
Troponin I: <0.3 ng/mL (ref <0.30)

## 2012-04-21 LAB — CBC WITH DIFFERENTIAL/PLATELET
Basophils Absolute: 0.1 10*3/uL (ref 0.0–0.1)
Eosinophils Absolute: 0.1 10*3/uL (ref 0.0–0.7)
HCT: 26.7 % — ABNORMAL LOW (ref 36.0–46.0)
Lymphocytes Relative: 6 % — ABNORMAL LOW (ref 12–46)
Lymphs Abs: 0.6 10*3/uL — ABNORMAL LOW (ref 0.7–4.0)
MCHC: 35.6 g/dL (ref 30.0–36.0)
MCV: 96 fL (ref 78.0–100.0)
Monocytes Relative: 10 % (ref 3–12)
Neutro Abs: 8.6 10*3/uL — ABNORMAL HIGH (ref 1.7–7.7)
Platelets: 63 10*3/uL — ABNORMAL LOW (ref 150–400)
RDW: 14.5 % (ref 11.5–15.5)
WBC: 10.4 10*3/uL (ref 4.0–10.5)

## 2012-04-21 LAB — URINE CULTURE

## 2012-04-21 MED ORDER — SODIUM CHLORIDE 0.9 % IV SOLN: INTRAVENOUS | Status: DC

## 2012-04-21 MED ORDER — DILTIAZEM HCL ER COATED BEADS 180 MG PO CP24
180.0000 mg | ORAL_CAPSULE | Freq: Every day | ORAL | Status: DC
  Administered 2012-04-21 – 2012-04-25 (×5): 180 mg via ORAL
  Filled 2012-04-21 (×5): qty 1

## 2012-04-21 MED ORDER — DULOXETINE HCL 20 MG PO CPEP: 20.0000 mg | ORAL_CAPSULE | Freq: Every day | ORAL | Status: DC

## 2012-04-21 MED ORDER — VENLAFAXINE HCL ER 75 MG PO CP24
75.0000 mg | ORAL_CAPSULE | Freq: Every day | ORAL | Status: DC
  Administered 2012-04-22: 75 mg via ORAL
  Filled 2012-04-21 (×2): qty 1

## 2012-04-21 NOTE — Progress Notes (Signed)
[facility] PROGRESS NOTE  Madison [name] ZOX:[id number] DOB: 1946/02/11 DOA: 04/18/2012 PCP: [name], MD  Assessment/Plan: Principal Problem:  *SIRS (systemic inflammatory response syndrome) Active Problems:  Atrial fibrillation  ARF (acute renal failure)  Diarrhea  Metabolic acidosis  LV dysfunction  Dehydration    1. diarrhea C. difficile colitis continue oral Flagyl 2. UTI /pyelonephritis? continue ciprofloxacin 3. Dehydration/acute renal failure/likely prerenal etiology. Creatinine Improving, Continue IV hydration with bicarbonate 4. Hypokalemia replete, secondary to diarrhea 5. Anion gap metabolic acidosis mixture of lactic acidosis from one end of each and bicarbonate loss from diarrhea, replace bicarbonate 6. Abnormal liver function secondary to shock liver, tests such as hida scan canceled for now, follow liver function tests 7. Atrial fibrillation rate controlled with PVCs 8. SIRS follow cultures blood cultures negative thus far, likely secondary to C. difficile colitis 9. History of lung cancer currently stable 10. Hypertension holding antihypertensive medications, will restart diltiazem, continue to hold HCTZ, 11.  Code Status: full  Family Communication: family updated about patient's clinical progress  Disposition Plan: Transfer to telemetry   Brief narrative: 66 year old female w/ PMH squamous cell lung CA (stage IIIa, last xrt august 2012, last chemo December 2012, PET scan march 2013 reassuring w/ near complete resolution of mediastinal adenopathy), afib (no longer on coumadin), LV dysfxn (EF 40-45%), who was admitted on 9/11 w/ CC: weakness and diarrhea X 1 week, occasional fevers and chills. On ER eval found to be tachycardic but initially normotensive w/ scr of 6 and metabolic acidosis. She was admitted for IV hydration and evaluation of diarrhea. PCCM was asked to see the am of 9/12 for boarderline hypotension   Consultants:  PC  CM  Procedures:  None  Antibiotics:  Ciprofloxacin/Flagyl  HPI/Subjective: Mx. Missed beats and PVCs throughout the night   Objective: Filed Vitals:   04/20/12 2000 04/21/12 0000 04/21/12 0008 04/21/12 0411  BP: 127/76  132/79 137/91  Pulse: 102  93 85  Temp: 98.1 F (36.7 C) 98.4 F (36.9 C)  97.8 F (36.6 C)  TempSrc: Oral Oral  Oral  Resp: 23  27 25   Height:      Weight:  71.5 kg (157 lb 10.1 oz)    SpO2: 100%  99% 100%    Intake/Output Summary (Last 24 hours) at 04/21/12 0836 Last data filed at 04/21/12 0400  Gross per 24 hour  Intake 2027.5 ml  Output   1225 ml  Net  802.5 ml    Exam:  HENT:  Head: Atraumatic.  Nose: Nose normal.  Mouth/Throat: Oropharynx is clear and moist.  Eyes: Conjunctivae are normal. Pupils are equal, round, and reactive to light. No scleral icterus.  Neck: Neck supple. No tracheal deviation present.  Cardiovascular: Normal rate, regular rhythm, normal heart sounds and intact distal pulses.  Pulmonary/Chest: Effort normal and breath sounds normal. No respiratory distress.  Abdominal: Soft. Normal appearance and bowel sounds are normal. She exhibits no distension. There is no tenderness.  Musculoskeletal: She exhibits no edema and no tenderness.  Neurological: She is alert. No cranial nerve deficit.    Data Reviewed: Basic Metabolic Panel:  Lab 04/21/12 8119 04/20/12 1400 04/20/12 0520 04/19/12 1340 04/19/12 0607  NA 138 140 138 136 137  K 3.5 3.1* 2.9* 3.4* 2.6*  CL 104 103 104 101 100  CO2 21 18* 15* 13* 13*  GLUCOSE 106* 114* 111* 82 88  BUN 91* 92* 91* 93* 95*  CREATININE 4.74* 4.98* 5.15* 5.31* 5.35*  CALCIUM 6.8* 7.0*  6.8* 6.9* 6.5*  MG -- -- -- -- --  PHOS -- -- -- -- --    Liver Function Tests:  Lab 04/20/12 0520 04/19/12 0607 04/18/12 2045  AST 67* 77* 130*  ALT 44* 40* 63*  ALKPHOS 81 69 86  BILITOT 0.5 0.5 0.8  PROT 5.9* 5.3* 7.5  ALBUMIN 2.0* 2.0* 2.9*    Lab 04/19/12 0607  LIPASE 32  AMYLASE --    No results found for this basename: AMMONIA:5 in the last 168 hours  CBC:  Lab 04/21/12 0416 04/20/12 0520 04/19/12 1340 04/19/12 0607 04/18/12 2045  WBC 10.4 12.8* 17.9* 18.7* 16.3*  NEUTROABS 8.6* -- -- 16.6* 14.4*  HGB 9.5* 11.2* 10.4* 9.2* 11.4*  HCT 26.7* 32.0* 29.7* 26.3* 31.9*  MCV 96.0 97.0 99.3 98.1 97.6  PLT 63* 62* 67* 65* 81*    Cardiac Enzymes:  Lab 04/21/12 0100 04/20/12 1945 04/20/12 1144 04/20/12 0520 04/19/12 2236  CKTOTAL -- -- -- -- --  CKMB -- -- -- -- --  CKMBINDEX -- -- -- -- --  TROPONINI <0.30 <0.30 <0.30 <0.30 <0.30   BNP (last 3 results) No results found for this basename: PROBNP:3 in the last 8760 hours   CBG:  Lab 04/21/12 0342 04/21/12 0322 04/21/12 0058 04/20/12 2034 04/20/12 1944  GLUCAP 93 97 142* 134* 138*    Recent Results (from the past 240 hour(s))  URINE CULTURE     Status: Normal (Preliminary result)   Collection Time   04/18/12 10:03 PM      Component Value Range Status Comment   Specimen Description URINE, CLEAN CATCH   Final    Special Requests NONE   Final    Culture  Setup Time 04/19/2012 06:02   Final    Colony Count >=100,000 COLONIES/ML   Final    Culture GRAM NEGATIVE RODS   Final    Report Status PENDING   Incomplete   CULTURE, BLOOD (ROUTINE X 2)     Status: Normal (Preliminary result)   Collection Time   04/19/12 12:01 AM      Component Value Range Status Comment   Specimen Description BLOOD PORTA CATH RIGHT   Final    Special Requests BOTTLES DRAWN AEROBIC AND ANAEROBIC UNKNOWN   Final    Culture  Setup Time 04/19/2012 04:05   Final    Culture     Final    Value:        BLOOD CULTURE RECEIVED NO GROWTH TO DATE CULTURE WILL BE HELD FOR 5 DAYS BEFORE ISSUING A FINAL NEGATIVE REPORT   Report Status PENDING   Incomplete   CULTURE, BLOOD (ROUTINE X 2)     Status: Normal (Preliminary result)   Collection Time   04/19/12 12:45 AM      Component Value Range Status Comment   Specimen Description BLOOD RIGHT HAND    Final    Special Requests BOTTLES DRAWN AEROBIC ONLY 2CC   Final    Culture  Setup Time 04/19/2012 04:06   Final    Culture     Final    Value:        BLOOD CULTURE RECEIVED NO GROWTH TO DATE CULTURE WILL BE HELD FOR 5 DAYS BEFORE ISSUING A FINAL NEGATIVE REPORT   Report Status PENDING   Incomplete   MRSA PCR SCREENING     Status: Normal   Collection Time   04/19/12  9:00 AM      Component Value Range Status Comment   MRSA  by PCR NEGATIVE  NEGATIVE Final   CLOSTRIDIUM DIFFICILE BY PCR     Status: Abnormal   Collection Time   04/19/12  9:45 AM      Component Value Range Status Comment   C difficile by pcr POSITIVE (*) NEGATIVE Final   STOOL CULTURE     Status: Normal (Preliminary result)   Collection Time   04/19/12  2:25 PM      Component Value Range Status Comment   Specimen Description PERIRECTAL   Final    Special Requests STOOL   Final    Culture NO GROWTH 1 DAY   Final    Report Status PENDING   Incomplete      Studies: Ct Abdomen Pelvis Wo Contrast  04/19/2012  *RADIOLOGY REPORT*  Clinical Data: Suspected abdominal sepsis. History of lung cancer. Elevated liver functions.  Elevated renal function.  Elevated white count.  CT ABDOMEN AND PELVIS WITHOUT CONTRAST  Technique:  Multidetector CT imaging of the abdomen and pelvis was performed following the standard protocol without intravenous contrast.  Comparison: Prior PET scan 10/26/2011.  Prior CT abdomen pelvis for and 2012.  Findings: Left greater than right pleural effusions.  Mild bibasilar atelectasis. Right hilar mass incompletely evaluated. Central venous catheter.  Unenhanced appearance of the liver shows no focal defects or dilated ducts.  Gallstones are seen in the gallbladder without wall thickening or pericholecystic fluid.  The right kidney is slightly enlarged with perinephric stranding. The left kidney is moderately enlarged with more perinephric stranding. Renal size has increased bilaterally, particularly the left,  compared with prior cross-sectional imaging studies.  There is no visible renal mass or hydronephrosis.  Concern is raised for bilateral pyelonephritis, worse on the left.  Normal pancreas, adrenal glands, stomach, small bowel, and colon. Diverticulosis without diverticulitis.  No bowel obstruction.  Free pelvic fluid. Unremarkable uterus.  uterus.  No adnexal mass. Bladder decompressed by Foley catheter.  No worrisome osseous lesions.  Nonaneurysmal atherosclerotic calcification of the aorta.  IMPRESSION: Bilateral renal enlargement, left greater than right, with perinephric stranding.  Concern raised for pyelonephritis.  Free intra-abdominal fluid without free air.  No evidence for bowel obstruction or perforation.  Bilateral pleural effusions with right infrahilar opacity, likely mass. This could be slightly worse compared with previous PET scan.   Original Report Authenticated By: Elsie Stain, M.D.    US Renal  04/19/2012  *RADIOLOGY REPORT*  Clinical Data: Urinary tract infection.  Renal failure.  Increased creatinine.  History of lung cancer.  RENAL/URINARY TRACT ULTRASOUND COMPLETE  Comparison:  CT abdomen and pelvis 11/16/2010.  Findings:  Right Kidney:  Right kidney measures 12.2 cm length.  No hydronephrosis.  Normal parenchymal echotexture.  Left Kidney:  Left kidney measures 12.9 cm length.  No hydronephrosis.  Normal parenchymal echotexture.  Bladder:  The bladder is decompressed.  Limited visualization.  Incidental note of stones and sludge in the gallbladder without gallbladder wall thickening or edema.  No bile duct dilatation.  IMPRESSION: Normal ultrasound appearance of the kidneys.  Decompressed bladder limits visualization.  Incidental note of cholelithiasis with sludge in the gallbladder.   Original Report Authenticated By: Marlon Pel, M.D.    Dg Chest Port 1 View  04/20/2012  *RADIOLOGY REPORT*  Clinical Data: Infiltrates, possible pneumonia  PORTABLE CHEST - 1 VIEW   Comparison: Portable chest x-ray of 04/19/2012  Findings: There is more opacity at both lung bases which may reflect atelectasis or possibly pneumonia.  Also, there may be a  small left effusion present.  Heart size is stable.  Right Port-A- Cath is unchanged in position.  IMPRESSION: Increase in basilar opacities left greater than right.  Cannot exclude pneumonia with possible left effusion.   Original Report Authenticated By: Juline Patch, M.D.    Dg Chest Port 1 View  04/19/2012  *RADIOLOGY REPORT*  Clinical Data: Fever.  PORTABLE CHEST - 1 VIEW  Comparison: 11/25/2010  Findings: Shallow inspiration.  Normal heart size and pulmonary vascularity.  Mass suggested in the right hilum with consolidation in the right mid and lower lung region.  Diffuse interstitial fibrosis in the lungs.  No blunting of costophrenic angles.  No pneumothorax.  There is improvement since previous study with no new infiltrates identified.  Interval placement of a power port type right central venous catheter with tip over the low SVC region.  IMPRESSION: Right hilar mass with right mid and lower lung consolidation. Diffuse interstitial changes.  Improved parenchymal infiltrates and effusions since previous study.   Original Report Authenticated By: Marlon Pel, M.D.     Scheduled Meds:   . ciprofloxacin  400 mg Intravenous Q24H  . feeding supplement  237 mL Oral BID BM  . folic acid  1 mg Oral Daily  . metroNIDAZOLE  500 mg Oral Q8H  . potassium chloride  30 mEq Oral BID  . potassium chloride  10 mEq Intravenous Q1 Hr x 2  . potassium chloride  10 mEq Intravenous Q1 Hr x 2  . sodium chloride  3 mL Intravenous Q12H  . thiamine  100 mg Oral Daily   Continuous Infusions:   .  sodium bicarbonate infusion 1/4 NS 1000 mL 75 mL/hr at 04/21/12 0009    Principal Problem:  *SIRS (systemic inflammatory response syndrome) Active Problems:  Atrial fibrillation  ARF (acute renal failure)  Diarrhea  Metabolic  acidosis  LV dysfunction  Dehydration    Time spent: 40 minutes   [facility]  [facility] Pager [phone]. If 8PM-8AM, please contact night-coverage at [email or web address], password [email or web address] 04/21/2012, 8:36 AM  LOS: 3 days

## 2012-04-21 NOTE — Progress Notes (Signed)
Mx. Missed beats and PVCs throughout the night. On call TH aware. 1600 K 3.1. Patient given schedule K along with 2 runs. A.M bmp ordered. Will continue to monitor this pt closely.

## 2012-04-22 LAB — COMPREHENSIVE METABOLIC PANEL
AST: 24 U/L (ref 0–37)
BUN: 90 mg/dL — ABNORMAL HIGH (ref 6–23)
CO2: 22 mEq/L (ref 19–32)
Calcium: 6.6 mg/dL — ABNORMAL LOW (ref 8.4–10.5)
Chloride: 104 mEq/L (ref 96–112)
Creatinine, Ser: 4.6 mg/dL — ABNORMAL HIGH (ref 0.50–1.10)
GFR calc Af Amer: 10 mL/min — ABNORMAL LOW (ref >90)
GFR calc non Af Amer: 9 mL/min — ABNORMAL LOW (ref >90)
Glucose, Bld: 93 mg/dL (ref 70–99)
Total Bilirubin: 0.6 mg/dL (ref 0.3–1.2)

## 2012-04-22 LAB — GLUCOSE, CAPILLARY
Glucose-Capillary: 92 mg/dL (ref 70–99)
Glucose-Capillary: 82 mg/dL (ref 70–99)
Glucose-Capillary: 82 mg/dL (ref 70–99)

## 2012-04-22 LAB — HEPATIC FUNCTION PANEL
ALT: 23 U/L (ref 0–35)
AST: 21 U/L (ref 0–37)
Bilirubin, Direct: 0.3 mg/dL (ref 0.0–0.3)
Indirect Bilirubin: 0.3 mg/dL (ref 0.3–0.9)
Total Bilirubin: 0.6 mg/dL (ref 0.3–1.2)

## 2012-04-22 MED ORDER — METRONIDAZOLE IN NACL 5-0.79 MG/ML-% IV SOLN
500.0000 mg | Freq: Three times a day (TID) | INTRAVENOUS | Status: DC
  Administered 2012-04-22 – 2012-04-25 (×9): 500 mg via INTRAVENOUS
  Filled 2012-04-22 (×11): qty 100

## 2012-04-22 MED ORDER — POTASSIUM CHLORIDE CRYS ER 20 MEQ PO TBCR
20.0000 meq | EXTENDED_RELEASE_TABLET | Freq: Two times a day (BID) | ORAL | Status: AC
  Administered 2012-04-22 (×2): 20 meq via ORAL
  Filled 2012-04-22 (×4): qty 1

## 2012-04-22 MED ORDER — VANCOMYCIN 50 MG/ML ORAL SOLUTION
125.0000 mg | Freq: Four times a day (QID) | ORAL | Status: DC
  Administered 2012-04-22 – 2012-04-25 (×12): 125 mg via ORAL
  Filled 2012-04-22 (×16): qty 2.5

## 2012-04-22 MED ORDER — POTASSIUM CHLORIDE IN NACL 40-0.9 MEQ/L-% IV SOLN
INTRAVENOUS | Status: DC
  Administered 2012-04-22 – 2012-04-23 (×4): via INTRAVENOUS
  Filled 2012-04-22 (×6): qty 1000

## 2012-04-22 NOTE — Progress Notes (Signed)
[facility] PROGRESS NOTE  SHAYONA HIBBITTS ZOX:[id number] DOB: 1945-10-03 DOA: 04/18/2012 PCP: [name], MD  Assessment/Plan: Principal Problem:  *SIRS (systemic inflammatory response syndrome) Active Problems:  Atrial fibrillation  ARF (acute renal failure)  Diarrhea  Metabolic acidosis  LV dysfunction  Dehydration    diarrhea C. difficile colitis , still having some diarrhea, Considering patient in ARF and presented with SIRS and WBC>15, would change this patient's C.diff infection severe, complicated and would recommend Flagyl 500 mg IV q8h and vancomycin 500 mg PO q6h per C.diff treatment protocol  UTI /pyelonephritis? E.coli and K.pneumoniae continue ciprofloxacin  1. Dehydration/acute renal failure/likely prerenal etiology. Creatinine Improving slowly, maintaining good urine output, Continue IV hydration 2. Hypokalemia replete, secondary to diarrhea 3. Anion gap metabolic acidosis mixture of lactic acidosis from one end of each and bicarbonate loss from diarrhea, replace bicarbonate 4. Abnormal liver function secondary to shock liver, tests such as hida scan canceled for now, follow liver function tests 5. Atrial fibrillation rate controlled with PVCs 6. SIRS follow cultures blood cultures negative thus far, likely secondary to C. difficile colitis 7. History of lung cancer currently stable 8. Hypertension holding antihypertensive medications, will restart diltiazem, continue to hold HCTZ, 9.  Code Status: full  Family Communication: family updated about patient's clinical progress  Disposition Plan: Transfer to telemetry    Brief narrative:  66 year old female w/ PMH squamous cell lung CA (stage IIIa, last xrt august 2012, last chemo December 2012, PET scan march 2013 reassuring w/ near complete resolution of mediastinal adenopathy), afib (no longer on coumadin), LV dysfxn (EF 40-45%), who was admitted on 9/11 w/ CC: weakness and diarrhea X 1 week, occasional  fevers and chills. On ER eval found to be tachycardic but initially normotensive w/ scr of 6 and metabolic acidosis. She was admitted for IV hydration and evaluation of diarrhea. PCCM was asked to see the am of 9/12 for boarderline hypotension  Consultants:  PC CM Procedures:  None Antibiotics:  Ciprofloxacin/Flagyl  HPI/Subjective:  Feeling well  Objective: Filed Vitals:   04/21/12 1330 04/21/12 2306 04/22/12 0700 04/22/12 0943  BP: 133/85 115/73 120/72 124/72  Pulse: 100 99 98   Temp: 98.5 F (36.9 C) 98.2 F (36.8 C) 98.1 F (36.7 C)   TempSrc: Oral Oral Oral   Resp: 22 22 22    Height:      Weight:      SpO2: 100% 100% 98%     Intake/Output Summary (Last 24 hours) at 04/22/12 1020 Last data filed at 04/22/12 0744  Gross per 24 hour  Intake 1646.66 ml  Output    750 ml  Net 896.66 ml    Exam:  HENT:  Head: Atraumatic.  Nose: Nose normal.  Mouth/Throat: Oropharynx is clear and moist.  Eyes: Conjunctivae are normal. Pupils are equal, round, and reactive to light. No scleral icterus.  Neck: Neck supple. No tracheal deviation present.  Cardiovascular: Normal rate, regular rhythm, normal heart sounds and intact distal pulses.  Pulmonary/Chest: Effort normal and breath sounds normal. No respiratory distress.  Abdominal: Soft. Normal appearance and bowel sounds are normal. She exhibits no distension. There is no tenderness.  Musculoskeletal: She exhibits no edema and no tenderness.  Neurological: She is alert. No cranial nerve deficit.    Data Reviewed: Basic Metabolic Panel:  Lab 04/22/12 8119 04/21/12 0416 04/20/12 1400 04/20/12 0520 04/19/12 1340  NA 139 138 140 138 136  K 2.9* 3.5 3.1* 2.9* 3.4*  CL 104 104 103 104 101  CO2 22 21 18* 15* 13*  GLUCOSE 93 106* 114* 111* 82  BUN 90* 91* 92* 91* 93*  CREATININE 4.60* 4.74* 4.98* 5.15* 5.31*  CALCIUM 6.6* 6.8* 7.0* 6.8* 6.9*  MG -- -- -- -- --  PHOS -- -- -- -- --    Liver Function Tests:  Lab 04/22/12  0505 04/20/12 0520 04/19/12 0607 04/18/12 2045  AST 24 67* 77* 130*  ALT 23 44* 40* 63*  ALKPHOS 81 81 69 86  BILITOT 0.6 0.5 0.5 0.8  PROT 4.8* 5.9* 5.3* 7.5  ALBUMIN 1.9* 2.0* 2.0* 2.9*    Lab 04/19/12 0607  LIPASE 32  AMYLASE --   No results found for this basename: AMMONIA:5 in the last 168 hours  CBC:  Lab 04/21/12 0416 04/20/12 0520 04/19/12 1340 04/19/12 0607 04/18/12 2045  WBC 10.4 12.8* 17.9* 18.7* 16.3*  NEUTROABS 8.6* -- -- 16.6* 14.4*  HGB 9.5* 11.2* 10.4* 9.2* 11.4*  HCT 26.7* 32.0* 29.7* 26.3* 31.9*  MCV 96.0 97.0 99.3 98.1 97.6  PLT 63* 62* 67* 65* 81*    Cardiac Enzymes:  Lab 04/21/12 1945 04/21/12 1302 04/21/12 0920 04/21/12 0100 04/20/12 1945  CKTOTAL -- -- -- -- --  CKMB -- -- -- -- --  CKMBINDEX -- -- -- -- --  TROPONINI <0.30 <0.30 <0.30 <0.30 <0.30   BNP (last 3 results) No results found for this basename: PROBNP:3 in the last 8760 hours   CBG:  Lab 04/22/12 0807 04/22/12 0357 04/21/12 2313 04/21/12 1630 04/21/12 1139  GLUCAP 95 92 113* 111* 100*    Recent Results (from the past 240 hour(s))  URINE CULTURE     Status: Normal   Collection Time   04/18/12 10:03 PM      Component Value Range Status Comment   Specimen Description URINE, CLEAN CATCH   Final    Special Requests NONE   Final    Culture  Setup Time 04/19/2012 06:02   Final    Colony Count >=100,000 COLONIES/ML   Final    Culture     Final    Value: KLEBSIELLA PNEUMONIAE     ESCHERICHIA COLI   Report Status 04/21/2012 FINAL   Final    Organism ID, Bacteria KLEBSIELLA PNEUMONIAE   Final    Organism ID, Bacteria ESCHERICHIA COLI   Final   CULTURE, BLOOD (ROUTINE X 2)     Status: Normal (Preliminary result)   Collection Time   04/19/12 12:01 AM      Component Value Range Status Comment   Specimen Description BLOOD PORTA CATH RIGHT   Final    Special Requests BOTTLES DRAWN AEROBIC AND ANAEROBIC UNKNOWN   Final    Culture  Setup Time 04/19/2012 04:05   Final    Culture     Final     Value:        BLOOD CULTURE RECEIVED NO GROWTH TO DATE CULTURE WILL BE HELD FOR 5 DAYS BEFORE ISSUING A FINAL NEGATIVE REPORT   Report Status PENDING   Incomplete   CULTURE, BLOOD (ROUTINE X 2)     Status: Normal (Preliminary result)   Collection Time   04/19/12 12:45 AM      Component Value Range Status Comment   Specimen Description BLOOD RIGHT HAND   Final    Special Requests BOTTLES DRAWN AEROBIC ONLY 2CC   Final    Culture  Setup Time 04/19/2012 04:06   Final    Culture     Final  Value:        BLOOD CULTURE RECEIVED NO GROWTH TO DATE CULTURE WILL BE HELD FOR 5 DAYS BEFORE ISSUING A FINAL NEGATIVE REPORT   Report Status PENDING   Incomplete   MRSA PCR SCREENING     Status: Normal   Collection Time   04/19/12  9:00 AM      Component Value Range Status Comment   MRSA by PCR NEGATIVE  NEGATIVE Final   CLOSTRIDIUM DIFFICILE BY PCR     Status: Abnormal   Collection Time   04/19/12  9:45 AM      Component Value Range Status Comment   C difficile by pcr POSITIVE (*) NEGATIVE Final   STOOL CULTURE     Status: Normal (Preliminary result)   Collection Time   04/19/12  2:25 PM      Component Value Range Status Comment   Specimen Description PERIRECTAL   Final    Special Requests STOOL   Final    Culture NO SUSPICIOUS COLONIES, CONTINUING TO HOLD   Final    Report Status PENDING   Incomplete      Studies: Ct Abdomen Pelvis Wo Contrast  04/19/2012  *RADIOLOGY REPORT*  Clinical Data: Suspected abdominal sepsis. History of lung cancer. Elevated liver functions.  Elevated renal function.  Elevated white count.  CT ABDOMEN AND PELVIS WITHOUT CONTRAST  Technique:  Multidetector CT imaging of the abdomen and pelvis was performed following the standard protocol without intravenous contrast.  Comparison: Prior PET scan 10/26/2011.  Prior CT abdomen pelvis for and 2012.  Findings: Left greater than right pleural effusions.  Mild bibasilar atelectasis. Right hilar mass incompletely evaluated.  Central venous catheter.  Unenhanced appearance of the liver shows no focal defects or dilated ducts.  Gallstones are seen in the gallbladder without wall thickening or pericholecystic fluid.  The right kidney is slightly enlarged with perinephric stranding. The left kidney is moderately enlarged with more perinephric stranding. Renal size has increased bilaterally, particularly the left, compared with prior cross-sectional imaging studies.  There is no visible renal mass or hydronephrosis.  Concern is raised for bilateral pyelonephritis, worse on the left.  Normal pancreas, adrenal glands, stomach, small bowel, and colon. Diverticulosis without diverticulitis.  No bowel obstruction.  Free pelvic fluid. Unremarkable uterus.  uterus.  No adnexal mass. Bladder decompressed by Foley catheter.  No worrisome osseous lesions.  Nonaneurysmal atherosclerotic calcification of the aorta.  IMPRESSION: Bilateral renal enlargement, left greater than right, with perinephric stranding.  Concern raised for pyelonephritis.  Free intra-abdominal fluid without free air.  No evidence for bowel obstruction or perforation.  Bilateral pleural effusions with right infrahilar opacity, likely mass. This could be slightly worse compared with previous PET scan.   Original Report Authenticated By: Elsie Stain, M.D.    US Renal  04/19/2012  *RADIOLOGY REPORT*  Clinical Data: Urinary tract infection.  Renal failure.  Increased creatinine.  History of lung cancer.  RENAL/URINARY TRACT ULTRASOUND COMPLETE  Comparison:  CT abdomen and pelvis 11/16/2010.  Findings:  Right Kidney:  Right kidney measures 12.2 cm length.  No hydronephrosis.  Normal parenchymal echotexture.  Left Kidney:  Left kidney measures 12.9 cm length.  No hydronephrosis.  Normal parenchymal echotexture.  Bladder:  The bladder is decompressed.  Limited visualization.  Incidental note of stones and sludge in the gallbladder without gallbladder wall thickening or edema.  No  bile duct dilatation.  IMPRESSION: Normal ultrasound appearance of the kidneys.  Decompressed bladder limits visualization.  Incidental note  of cholelithiasis with sludge in the gallbladder.   Original Report Authenticated By: Marlon Pel, M.D.    Dg Chest Port 1 View  04/20/2012  *RADIOLOGY REPORT*  Clinical Data: Infiltrates, possible pneumonia  PORTABLE CHEST - 1 VIEW  Comparison: Portable chest x-ray of 04/19/2012  Findings: There is more opacity at both lung bases which may reflect atelectasis or possibly pneumonia.  Also, there may be a small left effusion present.  Heart size is stable.  Right Port-A- Cath is unchanged in position.  IMPRESSION: Increase in basilar opacities left greater than right.  Cannot exclude pneumonia with possible left effusion.   Original Report Authenticated By: Juline Patch, M.D.    Dg Chest Port 1 View  04/19/2012  *RADIOLOGY REPORT*  Clinical Data: Fever.  PORTABLE CHEST - 1 VIEW  Comparison: 11/25/2010  Findings: Shallow inspiration.  Normal heart size and pulmonary vascularity.  Mass suggested in the right hilum with consolidation in the right mid and lower lung region.  Diffuse interstitial fibrosis in the lungs.  No blunting of costophrenic angles.  No pneumothorax.  There is improvement since previous study with no new infiltrates identified.  Interval placement of a power port type right central venous catheter with tip over the low SVC region.  IMPRESSION: Right hilar mass with right mid and lower lung consolidation. Diffuse interstitial changes.  Improved parenchymal infiltrates and effusions since previous study.   Original Report Authenticated By: Marlon Pel, M.D.     Scheduled Meds:   . ciprofloxacin  400 mg Intravenous Q24H  . diltiazem  180 mg Oral Daily  . feeding supplement  237 mL Oral BID BM  . folic acid  1 mg Oral Daily  . metronidazole  500 mg Intravenous Q8H  . potassium chloride  20 mEq Oral BID  . sodium chloride  3 mL  Intravenous Q12H  . thiamine  100 mg Oral Daily  . vancomycin  125 mg Oral Q6H  . venlafaxine XR  75 mg Oral Q breakfast  . DISCONTD: DULoxetine  20 mg Oral Daily  . DISCONTD: metroNIDAZOLE  500 mg Oral Q8H   Continuous Infusions:   . 0.9 % NaCl with KCl 40 mEq / L    . DISCONTD: sodium chloride 50 mL/hr at 04/21/12 0845    Principal Problem:  *SIRS (systemic inflammatory response syndrome) Active Problems:  Atrial fibrillation  ARF (acute renal failure)  Diarrhea  Metabolic acidosis  LV dysfunction  Dehydration    Time spent: 40 minutes   [facility]  [facility] Pager [phone]. If 8PM-8AM, please contact night-coverage at [email or web address], password [facility] 04/22/2012, 10:20 AM  LOS: 4 days

## 2012-04-22 NOTE — Progress Notes (Signed)
ANTIBIOTIC CONSULT NOTE - FOLLOW UP  Pharmacy Consult for Cipro Indication: gastroenteritis and UTI  Allergies  Allergen Reactions  . Lisinopril     Makes face swell   Patient Measurements: Height: 5\' 6"  (167.6 cm) Weight: 157 lb 10.1 oz (71.5 kg) IBW/kg (Calculated) : 59.3   Vital Signs: Temp: 98.1 F (36.7 C) (09/15 0700) Temp src: Oral (09/15 0700) BP: 124/72 mmHg (09/15 0943) Pulse Rate: 98  (09/15 0700)  Labs:  Basename 04/22/12 0505 04/21/12 0416 04/20/12 1400 04/20/12 0520 04/19/12 1340  WBC -- 10.4 -- 12.8* 17.9*  HGB -- 9.5* -- 11.2* 10.4*  PLT -- 63* -- 62* 67*  LABCREA -- -- -- -- --  CREATININE 4.60* 4.74* 4.98* -- --   Estimated Creatinine Clearance: 12 ml/min (by C-G formula based on Cr of 4.6).  Microbiology: 9/12 blood x 2 >> ngtd 9/11 urine >> Ecoli (pan-sens), Kleb pneumo (R-nitrofurantoin only, S-amp/cefazolin/cipro/gent/septra) 9/12 c.diff: positive 9/12 stool cx: NGTD  Assessment:  73 YOF presented with diarrhea and SIRS likely 2/2 gastroenteritis and UTI.    Day #4 IV Cipro and PO Flagyl.  C.diff PCR positive.  Stool occurrence x 4 yesterday.  UTI with E.coli and K.pneumoniae, both cultures sensitive to Cipro.  ARF slowly resolving, SCr 4.6 today, CrCl~12 ml/min.  Antibiotic doses remain appropriate.  Goal of Therapy:  Doses adjusted per renal clearance Eradication of infection  Plan:   Continue Cipro 400 mg IV q24h.  F/u planned LOT for antibiotics.  Considering patient in ARF and presented with SIRS and WBC>15, would consider this patient's C.diff infection severe, complicated and would recommend Flagyl 500 mg IV q8h and vancomycin 500 mg PO q6h per C.diff treatment protocol.  Clance Boll 04/22/2012,9:56 AM

## 2012-04-23 LAB — STOOL CULTURE

## 2012-04-23 LAB — GLUCOSE, CAPILLARY
Glucose-Capillary: 107 mg/dL — ABNORMAL HIGH (ref 70–99)
Glucose-Capillary: 119 mg/dL — ABNORMAL HIGH (ref 70–99)
Glucose-Capillary: 87 mg/dL (ref 70–99)

## 2012-04-23 LAB — CBC
HCT: 24.9 % — ABNORMAL LOW (ref 36.0–46.0)
Hemoglobin: 8.8 g/dL — ABNORMAL LOW (ref 12.0–15.0)
MCH: 34.8 pg — ABNORMAL HIGH (ref 26.0–34.0)
MCHC: 35.3 g/dL (ref 30.0–36.0)

## 2012-04-23 MED ORDER — ROPINIROLE HCL 0.25 MG PO TABS
0.2500 mg | ORAL_TABLET | Freq: Every day | ORAL | Status: DC
  Administered 2012-04-23 – 2012-04-24 (×2): 0.25 mg via ORAL
  Filled 2012-04-23 (×3): qty 1

## 2012-04-23 MED ORDER — ENOXAPARIN SODIUM 40 MG/0.4ML ~~LOC~~ SOLN
40.0000 mg | SUBCUTANEOUS | Status: DC
  Filled 2012-04-23 (×2): qty 0.4

## 2012-04-23 NOTE — Care Management Note (Addendum)
    Page 1 of 2   04/25/2012     3:15:22 PM   CARE MANAGEMENT NOTE 04/25/2012  Patient:  Madison [name], Madison [name]   Account Number:  [id number]  Date Initiated:  04/19/2012  Documentation initiated by:  DAVIS,RHONDA  Subjective/Objective Assessment:   hx of ca , now sirs, a,fib, in step down for monitoring,temp 102.1,     Action/Plan:   from home   Anticipated DC Date:  04/25/2012   Anticipated DC Plan:  HOME W HOME HEALTH SERVICES  In-house referral  Clinical Social Worker      DC Planning Services  CM consult      [facility] Choice  NA   Choice offered to / List presented to:  C-1 Patient   DME arranged  NA      DME agency  NA     HH arranged  HH-1 RN  HH-2 PT  HH-3 OT      HH agency  [facility]   Status of service:  Completed, signed off Medicare Important Message given?  NA - LOS <3 / Initial given by admissions (If response is "NO", the following Medicare IM given date fields will be blank) Date Medicare IM given:   Date Additional Medicare IM given:    Discharge Disposition:  HOME W HOME HEALTH SERVICES  Per UR Regulation:  Reviewed for med. necessity/level of care/duration of stay  If discussed at Long Length of Stay Meetings, dates discussed:   04/24/2012    Comments:  04/25/12 Johnnae Impastato RN,BSN NCM [phone] PATIENT AGREE TO HH,& CHOSE [facility] ([facility]) AWARE, & FOLLOWING.RECOMMEND HHRN/PT/OT IF MD AGREE PLEASE ORDER.  04/24/12 Charley Miske RN,BSN NCM [phone] DECLINED SNF.PROVIDED [facility] AGENCY LIST.HAS ALL DME.RECOMMEND HHRN/PT/OT.  04/23/12 Hazley Dezeeuw RN,BSN NCM [phone] ELEVATED CREATINE,DIARRNEA,UTI.IVF,IV ABX.D/C PLAN SNF.  [id number] Earlene Plater, RN, BSN, CCM: CHART REVIEWED AND UPDATED. NO DISCHARGE NEEDS PRESENT AT THIS TIME. CASE MANAGEMENT [phone]

## 2012-04-23 NOTE — Clinical Social Work Psychosocial (Unsigned)
     Clinical Social Work Department BRIEF PSYCHOSOCIAL ASSESSMENT 04/23/2012  Patient:  Madison [name], Madison [name]     Account Number:  [id number]     Admit date:  04/18/2012  Clinical Social Worker:  Hattie Perch  Date/Time:  04/23/2012 12:00 M  Referred by:  Physician  Date Referred:  04/23/2012 Referred for  SNF Placement   Other Referral:   Interview type:  Patient Other interview type:    PSYCHOSOCIAL DATA Living Status:  FAMILY Admitted from facility:   Level of care:   Primary support name:  Judith Blonder Primary support relationship to patient:  SPOUSE Degree of support available:   good    CURRENT CONCERNS Current Concerns  Post-Acute Placement   Other Concerns:    SOCIAL WORK ASSESSMENT / PLAN CSW met with patient. patient is alert and oriented X3. patient in need of snf placement. patient adamantly denies snf placement. patient states that she has all the equipment she needs at home and an extensive support system. patient states that she has also previously had home health and that she does not see the point in that as well.   Assessment/plan status:   Other assessment/ plan:   Information/referral to community resources:    PATIENTS/FAMILYS RESPONSE TO PLAN OF CARE: patient to go home with no needs. no further CSW needs noted

## 2012-04-23 NOTE — Progress Notes (Signed)
Pt has refused SQ Lovenox and PO Vancomycin at this time. Pt states Vancomycin causes her to have tremors. Maeola Harman

## 2012-04-23 NOTE — Evaluation (Signed)
Physical Therapy Evaluation Patient Details Name: Madison [name] MRN: [id number] DOB: 1946/01/12 Today's Date: 04/23/2012 Time: 0950-1010 PT Time Calculation (min): 20 min  PT Assessment / Plan / Recommendation Clinical Impression  Pt admitted with acute renal failure, diarrhea, c-diff, and SIRS.  Pt would benefit from acute PT services in order to improve independence and safety with transfers and ambulation to prepare for d/c to next venue.  Pt became dizzy with standing and did not resolve so assisted to recliner and did not ambulate today.  Will continue to assess d/c plans but would recommend SNF today.    PT Assessment  Patient needs continued PT services    Follow Up Recommendations  Skilled nursing facility    Barriers to Discharge        Equipment Recommendations  None recommended by PT    Recommendations for Other Services     Frequency Min 3X/week    Precautions / Restrictions Precautions Precautions: Fall   Pertinent Vitals/Pain No pain     Mobility  Bed Mobility Bed Mobility: Supine to Sit Supine to Sit: 5: Supervision;HOB elevated Details for Bed Mobility Assistance: increased time and effort, verbal cues for technique Transfers Transfers: Sit to Stand;Stand to Sit;Stand Pivot Transfers Sit to Stand: 3: Mod assist;From bed;With upper extremity assist Stand to Sit: 4: Min assist;To chair/3-in-1;With upper extremity assist Stand Pivot Transfers: 4: Min guard Details for Transfer Assistance: verbal cues for hand placement, assist to rise and steady due to strong posterior lean against bed (pt reports fear of falling forward), pt reported dizziness and with slightly shaky UEs upon transfer so only wished to go to recliner and declined ambulation today    Exercises     PT Diagnosis: Difficulty walking;Generalized weakness  PT Problem List: Decreased strength;Decreased activity tolerance;Decreased mobility;Decreased balance;Decreased safety awareness;Decreased  knowledge of use of DME PT Treatment Interventions: DME instruction;Gait training;Functional mobility training;Therapeutic activities;Therapeutic exercise;Balance training;Neuromuscular re-education;Patient/family education   PT Goals Acute Rehab PT Goals PT Goal Formulation: With patient Time For Goal Achievement: 04/30/12 Potential to Achieve Goals: Good Pt will go Supine/Side to Sit: with modified independence PT Goal: Supine/Side to Sit - Progress: Goal set today Pt will go Sit to Stand: with supervision PT Goal: Sit to Stand - Progress: Goal set today Pt will go Stand to Sit: with supervision PT Goal: Stand to Sit - Progress: Goal set today Pt will Ambulate: >150 feet;with supervision;with least restrictive assistive device PT Goal: Ambulate - Progress: Goal set today Pt will Perform Home Exercise Program: with supervision, verbal cues required/provided PT Goal: Perform Home Exercise Program - Progress: Goal set today  Visit Information  Last PT Received On: 04/23/12 Assistance Needed: +2 (safety)    Subjective Data  Subjective: I'm so glad to see you.   Prior Functioning  Home Living Lives With: Spouse;Daughter Type of Home: House Home Access: Stairs to enter Entrance Stairs-Number of Steps: 3 Entrance Stairs-Rails: Right Home Layout: One level Home Adaptive Equipment: Shower chair without back;Hospital bed;Walker - rolling;Straight cane Prior Function Level of Independence: Independent with assistive device(s) Communication Communication: No difficulties    Cognition  Overall Cognitive Status: Appears within functional limits for tasks assessed/performed Arousal/Alertness: Awake/alert Orientation Level: Appears intact for tasks assessed Behavior During Session: [facility] for tasks performed    Extremity/Trunk Assessment Right Lower Extremity Assessment RLE ROM/Strength/Tone: [facility] for tasks assessed Left Lower Extremity Assessment LLE ROM/Strength/Tone: [facility] for tasks  assessed   Balance    End of Session PT - End of Session  Equipment Utilized During Treatment: Gait belt Activity Tolerance: Patient tolerated treatment well Patient left: in chair;with call bell/phone within reach Nurse Communication: Other (comment) (aware pt up in chair)  GP     Alexina Niccoli,KATHrine E 04/23/2012, 10:48 AM Pager: [phone]

## 2012-04-23 NOTE — Evaluation (Signed)
Occupational Therapy Evaluation Patient Details Name: Madison [name] MRN: [id number] DOB: [birth year] Today's Date: 04/23/2012 Time: 4098-1191 OT Time Calculation (min): 15 min  OT Assessment / Plan / Recommendation Clinical Impression  Pt admitted with acute renal failure, diarrhea, c-diff, and SIRS. Pt displays decreased functional mobility, balance and independence with ADL. Will benefit from skilled OT services to improve safety and independence with self care tasks.     OT Assessment  Patient needs continued OT Services    Follow Up Recommendations  Skilled nursing facility    Barriers to Discharge      Equipment Recommendations  None recommended by OT    Recommendations for Other Services    Frequency  Min 2X/week    Precautions / Restrictions Precautions Precautions: Fall        ADL  Eating/Feeding: Simulated;Independent Where Assessed - Eating/Feeding: Chair Grooming: Simulated;Wash/dry hands;Set up;Supervision/safety Where Assessed - Grooming: Unsupported sitting Upper Body Bathing: Simulated;Chest;Right arm;Left arm;Abdomen;Supervision/safety;Set up Where Assessed - Upper Body Bathing: Unsupported sitting Lower Body Bathing: Simulated;Moderate assistance Where Assessed - Lower Body Bathing: Supported sit to stand Upper Body Dressing: Simulated;Supervision/safety;Set up Where Assessed - Upper Body Dressing: Unsupported sitting Lower Body Dressing: Simulated;Moderate assistance Where Assessed - Lower Body Dressing: Supported sit to Pharmacist, hospital: Simulated;Moderate assistance Toilet Transfer Method: Sit to stand Toileting - Clothing Manipulation and Hygiene: Simulated;Moderate assistance Where Assessed - Toileting Clothing Manipulation and Hygiene: Sit to stand from 3-in-1 or toilet Tub/Shower Transfer Method: Not assessed Equipment Used: Rolling walker ADL Comments: Pt stood from chair with mod assist and with weightbearing through heels, leaning  posteriorly. With increased time, pt able to shift weight forward and gain balance to where min assist only needed. Pt reporting dizziness in standing however.      OT Diagnosis: Generalized weakness  OT Problem List: Decreased strength;Decreased activity tolerance;Decreased knowledge of use of DME or AE;Impaired balance (sitting and/or standing) OT Treatment Interventions: Self-care/ADL training;Therapeutic activities;DME and/or AE instruction;Patient/family education   OT Goals Acute Rehab OT Goals OT Goal Formulation: With patient Time For Goal Achievement: 05/07/12 Potential to Achieve Goals: Good ADL Goals Pt Will Perform Grooming: with supervision;Standing at sink ADL Goal: Grooming - Progress: Goal set today Pt Will Perform Lower Body Bathing: with supervision;Sit to stand from chair;Sit to stand from bed ADL Goal: Lower Body Bathing - Progress: Goal set today Pt Will Perform Lower Body Dressing: with supervision;Sit to stand from chair;Sit to stand from bed ADL Goal: Lower Body Dressing - Progress: Goal set today Pt Will Transfer to Toilet: with supervision;Ambulation;with DME;3-in-1 ADL Goal: Toilet Transfer - Progress: Goal set today Pt Will Perform Toileting - Clothing Manipulation: with supervision;Standing ADL Goal: Toileting - Clothing Manipulation - Progress: Goal set today Pt Will Perform Toileting - Hygiene: with supervision;Sit to stand from 3-in-1/toilet ADL Goal: Toileting - Hygiene - Progress: Goal set today  Visit Information  Last OT Received On: 04/23/12 Assistance Needed: +2 (safety)    Subjective Data  Subjective: Its the medicine making me dizzy Patient Stated Goal: wants to return home   Prior Functioning  Vision/Perception  Home Living Lives With: Spouse;Daughter Type of Home: House Home Access: Stairs to enter [employer] of Steps: 3 Entrance Stairs-Rails: Right Home Layout: One level Bathroom Shower/Tub: Therapist, art: Standard Home Adaptive Equipment: Shower chair without back;Hospital bed;Walker - rolling;Straight cane;Bedside commode/3-in-1 Prior Function Level of Independence: Independent with assistive device(s);Needs assistance Needs Assistance: Meal Prep;Light Housekeeping Meal Prep: Total Light Housekeeping: Total Communication Communication: No difficulties  Cognition  Overall Cognitive Status: Appears within functional limits for tasks assessed/performed Arousal/Alertness: Awake/alert Orientation Level: Appears intact for tasks assessed Behavior During Session: [facility] for tasks performed    Extremity/Trunk Assessment Right Lower Extremity Assessment RLE ROM/Strength/Tone: [facility] for tasks assessed Left Lower Extremity Assessment LLE ROM/Strength/Tone: [facility] for tasks assessed   Mobility  Shoulder Instructions  Bed Mobility Bed Mobility: Supine to Sit Supine to Sit: 5: Supervision;HOB elevated Details for Bed Mobility Assistance: increased time and effort, verbal cues for technique Transfers Transfers: Sit to Stand;Stand to Sit Sit to Stand: 3: Mod assist;With upper extremity assist;From chair/3-in-1 Stand to Sit: 4: Min assist;With upper extremity assist;To chair/3-in-1 Details for Transfer Assistance: verbal cues for hand placement, assist to rise and steady due to posterior lean against chair. Pt reporting dizziness in standing and note pt is shaky.       Exercise     Balance     End of Session OT - End of Session Equipment Utilized During Treatment: Gait belt Activity Tolerance: Other (comment) (dizziness) Patient left: in chair;with call bell/phone within reach  GO     Lennox Laity [phone] 04/23/2012, 11:35 AM

## 2012-04-23 NOTE — Progress Notes (Signed)
[facility] PROGRESS NOTE  Madison [name] EAV:[id number] DOB: Apr 26, 1946 DOA: 04/18/2012 PCP: [name], MD  Assessment/Plan: Principal Problem:  *SIRS (systemic inflammatory response syndrome) Active Problems:  Atrial fibrillation  ARF (acute renal failure)  Diarrhea  Metabolic acidosis  LV dysfunction  Dehydration    diarrhea C. difficile colitis , still having some diarrhea, Considering patient in ARF and presented with SIRS and WBC>15, would change this patient's C.diff infection severe, complicated and would continue Flagyl 500 mg IV q8h and vancomycin 500 mg PO q6h per C.diff treatment protocol    UTI /pyelonephritis? E.coli and K.pneumoniae continue ciprofloxacin  1. Dehydration/acute renal failure/likely prerenal etiology. Creatinine Improving slowly, maintaining good urine output, Continue IV hydration 2. Hypokalemia replete, secondary to diarrhea 3. Anion gap metabolic acidosis mixture of lactic acidosis from one end of each and bicarbonate loss from diarrhea, replace bicarbonate 4. Abnormal liver function secondary to shock liver, tests such as hida scan canceled for now, follow liver function tests 5. Atrial fibrillation rate controlled with PVCs 6. SIRS follow cultures blood cultures negative thus far, likely secondary to C. difficile colitis 7. History of lung cancer currently stable 8. Hypertension holding antihypertensive medications, continue diltiazem, continue to hold HCTZ, 9. Restless leg syndrome, start requip Code Status: full  Family Communication: family updated about patient's clinical progress  Disposition Plan: Anticipate discharge in one to 2 days to SNF versus home     Brief narrative:  66 year old female w/ PMH squamous cell lung CA (stage IIIa, last xrt august 2012, last chemo December 2012, PET scan march 2013 reassuring w/ near complete resolution of mediastinal adenopathy), afib (no longer on coumadin), LV dysfxn (EF 40-45%), who was  admitted on 9/11 w/ CC: weakness and diarrhea X 1 week, occasional fevers and chills. On ER eval found to be tachycardic but initially normotensive w/ scr of 6 and metabolic acidosis. She was admitted for IV hydration and evaluation of diarrhea. PCCM was asked to see the am of 9/12 for boarderline hypotension  Consultants:  PC CM Procedures:  None Antibiotics:  Ciprofloxacin/Flagyl   HPI/Subjective:  Complaining of restless legs    Objective: Filed Vitals:   04/22/12 0943 04/22/12 1628 04/22/12 2245 04/23/12 0500  BP: 124/72 120/64 140/73 122/76  Pulse:  89 87 82  Temp:  97.7 F (36.5 C) 98.3 F (36.8 C) 97.8 F (36.6 C)  TempSrc:  Oral Oral Oral  Resp:  20 18 18   Height:      Weight:      SpO2:  97% 99% 99%    Intake/Output Summary (Last 24 hours) at 04/23/12 1225 Last data filed at 04/23/12 1212  Gross per 24 hour  Intake 1768.75 ml  Output   3352 ml  Net -1583.25 ml    Exam:  HENT:  Head: Atraumatic.  Nose: Nose normal.  Mouth/Throat: Oropharynx is clear and moist.  Eyes: Conjunctivae are normal. Pupils are equal, round, and reactive to light. No scleral icterus.  Neck: Neck supple. No tracheal deviation present.  Cardiovascular: Normal rate, regular rhythm, normal heart sounds and intact distal pulses.  Pulmonary/Chest: Effort normal and breath sounds normal. No respiratory distress.  Abdominal: Soft. Normal appearance and bowel sounds are normal. She exhibits no distension. There is no tenderness.  Musculoskeletal: She exhibits no edema and no tenderness.  Neurological: She is alert. No cranial nerve deficit.    Data Reviewed: Basic Metabolic Panel:  Lab 04/23/12 7829 04/22/12 0505 04/21/12 0416 04/20/12 1400 04/20/12 0520  NA 141  139 138 140 138  K 3.9 2.9* 3.5 3.1* 2.9*  CL 107 104 104 103 104  CO2 18* 22 21 18* 15*  GLUCOSE 85 93 106* 114* 111*  BUN 83* 90* 91* 92* 91*  CREATININE 4.44* 4.60* 4.74* 4.98* 5.15*  CALCIUM 6.8* 6.6* 6.8* 7.0* 6.8*    MG -- -- -- -- --  PHOS -- -- -- -- --    Liver Function Tests:  Lab 04/22/12 1456 04/22/12 0505 04/20/12 0520 04/19/12 0607 04/18/12 2045  AST 21 24 67* 77* 130*  ALT 23 23 44* 40* 63*  ALKPHOS 70 81 81 69 86  BILITOT 0.6 0.6 0.5 0.5 0.8  PROT 5.1* 4.8* 5.9* 5.3* 7.5  ALBUMIN 2.0* 1.9* 2.0* 2.0* 2.9*    Lab 04/19/12 0607  LIPASE 32  AMYLASE --   No results found for this basename: AMMONIA:5 in the last 168 hours  CBC:  Lab 04/23/12 0955 04/21/12 0416 04/20/12 0520 04/19/12 1340 04/19/12 0607 04/18/12 2045  WBC 12.8* 10.4 12.8* 17.9* 18.7* --  NEUTROABS -- 8.6* -- -- 16.6* 14.4*  HGB 8.8* 9.5* 11.2* 10.4* 9.2* --  HCT 24.9* 26.7* 32.0* 29.7* 26.3* --  MCV 98.4 96.0 97.0 99.3 98.1 --  PLT 85* 63* 62* 67* 65* --    Cardiac Enzymes:  Lab 04/21/12 1945 04/21/12 1302 04/21/12 0920 04/21/12 0100 04/20/12 1945  CKTOTAL -- -- -- -- --  CKMB -- -- -- -- --  CKMBINDEX -- -- -- -- --  TROPONINI <0.30 <0.30 <0.30 <0.30 <0.30   BNP (last 3 results) No results found for this basename: PROBNP:3 in the last 8760 hours   CBG:  Lab 04/23/12 1220 04/23/12 0755 04/23/12 0405 04/23/12 0030 04/22/12 1948  GLUCAP 107* 83 83 119* 82    Recent Results (from the past 240 hour(s))  URINE CULTURE     Status: Normal   Collection Time   04/18/12 10:03 PM      Component Value Range Status Comment   Specimen Description URINE, CLEAN CATCH   Final    Special Requests NONE   Final    Culture  Setup Time 04/19/2012 06:02   Final    Colony Count >=100,000 COLONIES/ML   Final    Culture     Final    Value: KLEBSIELLA PNEUMONIAE     ESCHERICHIA COLI   Report Status 04/21/2012 FINAL   Final    Organism ID, Bacteria KLEBSIELLA PNEUMONIAE   Final    Organism ID, Bacteria ESCHERICHIA COLI   Final   CULTURE, BLOOD (ROUTINE X 2)     Status: Normal (Preliminary result)   Collection Time   04/19/12 12:01 AM      Component Value Range Status Comment   Specimen Description BLOOD PORTA CATH RIGHT    Final    Special Requests BOTTLES DRAWN AEROBIC AND ANAEROBIC UNKNOWN   Final    Culture  Setup Time 04/19/2012 04:05   Final    Culture     Final    Value:        BLOOD CULTURE RECEIVED NO GROWTH TO DATE CULTURE WILL BE HELD FOR 5 DAYS BEFORE ISSUING A FINAL NEGATIVE REPORT   Report Status PENDING   Incomplete   CULTURE, BLOOD (ROUTINE X 2)     Status: Normal (Preliminary result)   Collection Time   04/19/12 12:45 AM      Component Value Range Status Comment   Specimen Description BLOOD RIGHT HAND   Final  Special Requests BOTTLES DRAWN AEROBIC ONLY 2CC   Final    Culture  Setup Time 04/19/2012 04:06   Final    Culture     Final    Value:        BLOOD CULTURE RECEIVED NO GROWTH TO DATE CULTURE WILL BE HELD FOR 5 DAYS BEFORE ISSUING A FINAL NEGATIVE REPORT   Report Status PENDING   Incomplete   MRSA PCR SCREENING     Status: Normal   Collection Time   04/19/12  9:00 AM      Component Value Range Status Comment   MRSA by PCR NEGATIVE  NEGATIVE Final   CLOSTRIDIUM DIFFICILE BY PCR     Status: Abnormal   Collection Time   04/19/12  9:45 AM      Component Value Range Status Comment   C difficile by pcr POSITIVE (*) NEGATIVE Final   STOOL CULTURE     Status: Normal   Collection Time   04/19/12  2:25 PM      Component Value Range Status Comment   Specimen Description PERIRECTAL   Final    Special Requests STOOL   Final    Culture     Final    Value: NO SALMONELLA, SHIGELLA, CAMPYLOBACTER, YERSINIA, OR E.COLI 0157:H7 ISOLATED   Report Status 04/23/2012 FINAL   Final      Studies: Ct Abdomen Pelvis Wo Contrast  04/19/2012  *RADIOLOGY REPORT*  Clinical Data: Suspected abdominal sepsis. History of lung cancer. Elevated liver functions.  Elevated renal function.  Elevated white count.  CT ABDOMEN AND PELVIS WITHOUT CONTRAST  Technique:  Multidetector CT imaging of the abdomen and pelvis was performed following the standard protocol without intravenous contrast.  Comparison: Prior PET scan  10/26/2011.  Prior CT abdomen pelvis for and 2012.  Findings: Left greater than right pleural effusions.  Mild bibasilar atelectasis. Right hilar mass incompletely evaluated. Central venous catheter.  Unenhanced appearance of the liver shows no focal defects or dilated ducts.  Gallstones are seen in the gallbladder without wall thickening or pericholecystic fluid.  The right kidney is slightly enlarged with perinephric stranding. The left kidney is moderately enlarged with more perinephric stranding. Renal size has increased bilaterally, particularly the left, compared with prior cross-sectional imaging studies.  There is no visible renal mass or hydronephrosis.  Concern is raised for bilateral pyelonephritis, worse on the left.  Normal pancreas, adrenal glands, stomach, small bowel, and colon. Diverticulosis without diverticulitis.  No bowel obstruction.  Free pelvic fluid. Unremarkable uterus.  uterus.  No adnexal mass. Bladder decompressed by Foley catheter.  No worrisome osseous lesions.  Nonaneurysmal atherosclerotic calcification of the aorta.  IMPRESSION: Bilateral renal enlargement, left greater than right, with perinephric stranding.  Concern raised for pyelonephritis.  Free intra-abdominal fluid without free air.  No evidence for bowel obstruction or perforation.  Bilateral pleural effusions with right infrahilar opacity, likely mass. This could be slightly worse compared with previous PET scan.   Original Report Authenticated By: Elsie Stain, M.D.    US Renal  04/19/2012  *RADIOLOGY REPORT*  Clinical Data: Urinary tract infection.  Renal failure.  Increased creatinine.  History of lung cancer.  RENAL/URINARY TRACT ULTRASOUND COMPLETE  Comparison:  CT abdomen and pelvis 11/16/2010.  Findings:  Right Kidney:  Right kidney measures 12.2 cm length.  No hydronephrosis.  Normal parenchymal echotexture.  Left Kidney:  Left kidney measures 12.9 cm length.  No hydronephrosis.  Normal parenchymal echotexture.   Bladder:  The bladder is  decompressed.  Limited visualization.  Incidental note of stones and sludge in the gallbladder without gallbladder wall thickening or edema.  No bile duct dilatation.  IMPRESSION: Normal ultrasound appearance of the kidneys.  Decompressed bladder limits visualization.  Incidental note of cholelithiasis with sludge in the gallbladder.   Original Report Authenticated By: Marlon Pel, M.D.    Dg Chest Port 1 View  04/20/2012  *RADIOLOGY REPORT*  Clinical Data: Infiltrates, possible pneumonia  PORTABLE CHEST - 1 VIEW  Comparison: Portable chest x-ray of 04/19/2012  Findings: There is more opacity at both lung bases which may reflect atelectasis or possibly pneumonia.  Also, there may be a small left effusion present.  Heart size is stable.  Right Port-A- Cath is unchanged in position.  IMPRESSION: Increase in basilar opacities left greater than right.  Cannot exclude pneumonia with possible left effusion.   Original Report Authenticated By: Juline Patch, M.D.    Dg Chest Port 1 View  04/19/2012  *RADIOLOGY REPORT*  Clinical Data: Fever.  PORTABLE CHEST - 1 VIEW  Comparison: 11/25/2010  Findings: Shallow inspiration.  Normal heart size and pulmonary vascularity.  Mass suggested in the right hilum with consolidation in the right mid and lower lung region.  Diffuse interstitial fibrosis in the lungs.  No blunting of costophrenic angles.  No pneumothorax.  There is improvement since previous study with no new infiltrates identified.  Interval placement of a power port type right central venous catheter with tip over the low SVC region.  IMPRESSION: Right hilar mass with right mid and lower lung consolidation. Diffuse interstitial changes.  Improved parenchymal infiltrates and effusions since previous study.   Original Report Authenticated By: Marlon Pel, M.D.     Scheduled Meds:   . ciprofloxacin  400 mg Intravenous Q24H  . diltiazem  180 mg Oral Daily  . enoxaparin  (LOVENOX) injection  40 mg Subcutaneous Q24H  . feeding supplement  237 mL Oral BID BM  . metronidazole  500 mg Intravenous Q8H  . potassium chloride  20 mEq Oral BID  . rOPINIRole  0.25 mg Oral QHS  . sodium chloride  3 mL Intravenous Q12H  . vancomycin  125 mg Oral Q6H   Continuous Infusions:   . 0.9 % NaCl with KCl 40 mEq / L 75 mL/hr at 04/22/12 1600    Principal Problem:  *SIRS (systemic inflammatory response syndrome) Active Problems:  Atrial fibrillation  ARF (acute renal failure)  Diarrhea  Metabolic acidosis  LV dysfunction  Dehydration    Time spent: 40 minutes   [facility]  [facility] Pager [phone]. If 8PM-8AM, please contact night-coverage at [email or web address], password [facility] 04/23/2012, 12:25 PM  LOS: 5 days

## 2012-04-24 DIAGNOSIS — N289 Disorder of kidney and ureter, unspecified: Secondary | ICD-10-CM

## 2012-04-24 LAB — BASIC METABOLIC PANEL
Calcium: 6.6 mg/dL — ABNORMAL LOW (ref 8.4–10.5)
Chloride: 110 mEq/L (ref 96–112)
Creatinine, Ser: 4.22 mg/dL — ABNORMAL HIGH (ref 0.50–1.10)
GFR calc Af Amer: 12 mL/min — ABNORMAL LOW (ref >90)
Sodium: 140 mEq/L (ref 135–145)

## 2012-04-24 LAB — CBC
HCT: 22.6 % — ABNORMAL LOW (ref 36.0–46.0)
Platelets: 85 10*3/uL — ABNORMAL LOW (ref 150–400)
RBC: 2.26 MIL/uL — ABNORMAL LOW (ref 3.87–5.11)
RDW: 15.4 % (ref 11.5–15.5)
WBC: 9.4 10*3/uL (ref 4.0–10.5)

## 2012-04-24 LAB — RETICULOCYTES
RBC.: 2.56 MIL/uL — ABNORMAL LOW (ref 3.87–5.11)
Retic Ct Pct: 0.5 % (ref 0.4–3.1)

## 2012-04-24 LAB — GLUCOSE, CAPILLARY: Glucose-Capillary: 117 mg/dL — ABNORMAL HIGH (ref 70–99)

## 2012-04-24 LAB — IRON AND TIBC
Saturation Ratios: 67 % — ABNORMAL HIGH (ref 20–55)
UIBC: 35 ug/dL — ABNORMAL LOW (ref 125–400)

## 2012-04-24 MED ORDER — SODIUM CHLORIDE 0.9 % IV SOLN
INTRAVENOUS | Status: DC
  Administered 2012-04-24 – 2012-04-25 (×4): via INTRAVENOUS

## 2012-04-24 NOTE — Progress Notes (Signed)
Physical Therapy Treatment Patient Details Name: ASPIN PALOMAREZ MRN: [id number] DOB: 22-Jul-1946 Today's Date: 04/24/2012 Time: 4098-1191 PT Time Calculation (min): 21 min  PT Assessment / Plan / Recommendation Comments on Treatment Session  Pt able to ambulate in hallway today and denies dizziness with ambulation.  Still presents with min assist for posterior lean upon standing.    Follow Up Recommendations  Skilled nursing facility (per chart, pt declining SNF and HHPT however)    Barriers to Discharge        Equipment Recommendations  None recommended by OT;None recommended by PT    Recommendations for Other Services    Frequency     Plan Discharge plan remains appropriate;Frequency remains appropriate    Precautions / Restrictions Precautions Precautions: Fall   Pertinent Vitals/Pain No pain    Mobility  Transfers Transfers: Sit to Stand;Stand to Sit Sit to Stand: 4: Min assist;From chair/3-in-1;With upper extremity assist Stand to Sit: 4: Min guard;To chair/3-in-1;With upper extremity assist Details for Transfer Assistance: verbal cues for safe technique and to shift weight forward as pt still posteriorly leans against chair to rise Ambulation/Gait Ambulation/Gait Assistance: 4: Min guard Ambulation Distance (Feet): 100 Feet (total) Ambulation/Gait Assistance Details: 20 feet, standing rest break, 30 feet, standing rest break then 50 feet back to room, verbal cues for safe use of RW (distance from body), pt reports no dizziness with ambulation (+2 for safety in case of dizziness) Gait Pattern: Step-through pattern;Decreased stride length Gait velocity: decreased    Exercises     PT Diagnosis:    PT Problem List:   PT Treatment Interventions:     PT Goals Acute Rehab PT Goals PT Goal: Sit to Stand - Progress: Progressing toward goal PT Goal: Stand to Sit - Progress: Progressing toward goal PT Goal: Ambulate - Progress: Progressing toward goal  Visit  Information  Last PT Received On: 04/24/12 Assistance Needed: +1    Subjective Data  Subjective: I got dizzy when I got up this morning from taking that medicine.   Cognition  Overall Cognitive Status: Appears within functional limits for tasks assessed/performed    Balance  Balance Balance Assessed: Yes Dynamic Standing Balance Dynamic Standing - Balance Support: During functional activity Dynamic Standing - Level of Assistance: 4: Min assist Dynamic Standing - Comments: pt pulling up under garment and required min assist to steady, verbal cue to find balance prior to attempting donning   End of Session PT - End of Session Activity Tolerance: Patient tolerated treatment well Patient left: in chair;with call bell/phone within reach;with chair alarm set   GP     Godric Lavell,KATHrine E 04/24/2012, 10:52 AM Pager: [phone]

## 2012-04-24 NOTE — Progress Notes (Signed)
Occupational Therapy Treatment Patient Details Name: Madison [name] MRN: [id number] DOB: 1946-05-01 Today's Date: 04/24/2012 Time: 1530-1606 OT Time Calculation (min): 36 min  OT Assessment / Plan / Recommendation Comments on Treatment Session Pt still with diarrhea and bowel incontinence with mobility.  Still needs min assist for consistent sit to stand with LB selfcare and toileting tasks.  If discharged home recommend close supervision/min assist from spouse and daughter with continued OT home health.      Follow Up Recommendations  Home health OT       Equipment Recommendations  None recommended by OT       Frequency Min 2X/week   Plan Discharge plan needs to be updated    Precautions / Restrictions Precautions Precautions: Fall Precaution Comments: bowel incontinence Restrictions Weight Bearing Restrictions: No   Pertinent Vitals/Pain No report of pain, dyspnea 2/4 with activity during session    ADL  Grooming: Performed;Set up;Wash/dry hands Where Assessed - Grooming: Unsupported sitting Lower Body Bathing: Performed;Minimal assistance Where Assessed - Lower Body Bathing: Supported sit to stand Lower Body Dressing: Performed;Supervision/safety (gripper socks only) Where Assessed - Lower Body Dressing: Supported sit to stand Toilet Transfer: Performed;Minimal assistance Toilet Transfer Method: Other (comment) (ambulate with RW to bedside toilet) Acupuncturist: Bedside commode Toileting - Clothing Manipulation and Hygiene: Performed;Minimal assistance Where Assessed - Engineer, mining and Hygiene: Sit to stand from 3-in-1 or toilet Equipment Used: Rolling walker Transfers/Ambulation Related to ADLs: Pt needs min assist for sit to stand from bedside chair and from 3:1.  Min guard assist for balance with functional transfer using the RW. ADL Comments: Pt ambulated to the bathroom but experienced bowel incontinence during mobility.  Able to  perform some aspects of LB bathing to clean herself as well as donning new gripper socks.  Min instructional cueing for hand placement with sit to stand.     OT Goals ADL Goals ADL Goal: Grooming - Progress: Progressing toward goals ADL Goal: Lower Body Bathing - Progress: Progressing toward goals ADL Goal: Lower Body Dressing - Progress: Progressing toward goals ADL Goal: Toilet Transfer - Progress: Progressing toward goals ADL Goal: Toileting - Clothing Manipulation - Progress: Progressing toward goals ADL Goal: Toileting - Hygiene - Progress: Progressing toward goals  Visit Information  Last OT Received On: 04/24/12 Assistance Needed: +1    Subjective Data  Subjective: I'm so tired right now, but I guess I'll try. Patient Stated Goal: Did not state but agreeable to walk to the bathroom.      Cognition  Overall Cognitive Status: Appears within functional limits for tasks assessed/performed Arousal/Alertness: Awake/alert Orientation Level: Appears intact for tasks assessed Behavior During Session: [facility] for tasks performed    Mobility  Shoulder Instructions Transfers Transfers: Sit to Stand Sit to Stand: 4: Min assist;With upper extremity assist;With armrests;From chair/3-in-1 Stand to Sit: 4: Min assist;To chair/3-in-1;With armrests;With upper extremity assist Details for Transfer Assistance: min instructional cueing for hand placement         Balance Balance Balance Assessed: Yes Dynamic Standing Balance Dynamic Standing - Balance Support: Right upper extremity supported;Left upper extremity supported Dynamic Standing - Level of Assistance: 4: Min assist   End of Session OT - End of Session Activity Tolerance: Patient limited by fatigue Patient left: in chair;with call bell/phone within reach;with chair alarm set     Kemontae Dunklee OTR/L 04/24/2012, 4:17 PM

## 2012-04-24 NOTE — Progress Notes (Signed)
CSW received consultation to assess for skilled nursing placement. Per patient's chart, patient has been declining SNF. CSW contacted patient and she confirmed that she was declining SNF. CSW encouraged patient to be open to [facility] services that were discussed with her earlier by [facility]. Patient stated she would. CSW signing off, please re-consult as needed.  Lia Foyer, LCSWA [facility] Clinical Social Worker Contact #: [phone] (PRN)

## 2012-04-24 NOTE — Progress Notes (Signed)
[facility] PROGRESS NOTE  Madison [name] ZOX:[id number] DOB: 01/27/1946 DOA: 04/18/2012 PCP: [name], MD  Assessment/Plan: Principal Problem:  *SIRS (systemic inflammatory response syndrome) Active Problems:  Atrial fibrillation  ARF (acute renal failure)  Diarrhea  Metabolic acidosis  LV dysfunction  Dehydration    1. C. difficile colitis continue with IV Flagyl and oral vancomycin, will discontinue IV Flagyl tomorrow and simply continue with oral vancomycin 2. Klebsiella/Escherichia coli UTI continue ciprofloxacin today discontinue tomorrow 3. Acute kidney injury likely secondary to severe C. difficile prerenal, creatinine improving very slowly, good urine output 4. Hypokalemia requiring constant repletion 5. Anion gap metabolic acidosis secondary to severe C. difficile colitis requiring bicarbonate replacement and step down admission for about 3 days 6. History of lung cancer stable 7. Restless leg syndrome started on drip 8. Hypertension holding antihypertensive medications with the exception of diltiazem 9. Anemia hemoglobin has been trending down steadily. We'll check stool guaiacs. If lower than 7.5 tomorrow we'll transfuse again, Lovenox has been discontinued 10.   Code Status: full Family Communication: family updated about patient's clinical progress Disposition Plan:  Not stable for discharge to hemoglobin trending down, denies snf placement  placement   Brief narrative: 66 year old female w/ PMH squamous cell lung CA (stage IIIa, last xrt august 2012, last chemo December 2012, PET scan march 2013 reassuring w/ near complete resolution of mediastinal adenopathy), afib (no longer on coumadin), LV dysfxn (EF 40-45%), who was admitted on 9/11 w/ CC: weakness and diarrhea X 1 week, occasional fevers and chills. On ER eval found to be tachycardic but initially normotensive w/ scr of 6 and metabolic acidosis. She was admitted for IV hydration and evaluation of  diarrhea. Pulmonary critical care consultation was obtained due to hypotension and sepsis   HPI/Subjective: Feeling well, denies any melanotic black tarry stools  Objective: Filed Vitals:   04/23/12 1903 04/23/12 1906 04/23/12 2105 04/24/12 0437  BP: 115/65 123/68 125/74 119/68  Pulse: 92 106 92 85  Temp:   98.2 F (36.8 C) 98.1 F (36.7 C)  TempSrc:   Oral Oral  Resp:   18 18  Height:      Weight:    65.1 kg (143 lb 8.3 oz)  SpO2:   100% 99%    Intake/Output Summary (Last 24 hours) at 04/24/12 0939 Last data filed at 04/24/12 0612  Gross per 24 hour  Intake   3240 ml  Output   1003 ml  Net   2237 ml    Exam:  HENT:  Head: Atraumatic.  Nose: Nose normal.  Mouth/Throat: Oropharynx is clear and moist.  Eyes: Conjunctivae are normal. Pupils are equal, round, and reactive to light. No scleral icterus.  Neck: Neck supple. No tracheal deviation present.  Cardiovascular: Normal rate, regular rhythm, normal heart sounds and intact distal pulses.  Pulmonary/Chest: Effort normal and breath sounds normal. No respiratory distress.  Abdominal: Soft. Normal appearance and bowel sounds are normal. She exhibits no distension. There is no tenderness.  Musculoskeletal: She exhibits no edema and no tenderness.  Neurological: She is alert. No cranial nerve deficit.    Data Reviewed: Basic Metabolic Panel:  Lab 04/24/12 8119 04/23/12 0540 04/22/12 0505 04/21/12 0416 04/20/12 1400  NA 140 141 139 138 140  K 4.7 3.9 2.9* 3.5 3.1*  CL 110 107 104 104 103  CO2 17* 18* 22 21 18*  GLUCOSE 84 85 93 106* 114*  BUN 75* 83* 90* 91* 92*  CREATININE 4.22* 4.44* 4.60* 4.74* 4.98*  CALCIUM 6.6* 6.8* 6.6* 6.8* 7.0*  MG -- -- -- -- --  PHOS -- -- -- -- --    Liver Function Tests:  Lab 04/22/12 1456 04/22/12 0505 04/20/12 0520 04/19/12 0607 04/18/12 2045  AST 21 24 67* 77* 130*  ALT 23 23 44* 40* 63*  ALKPHOS 70 81 81 69 86  BILITOT 0.6 0.6 0.5 0.5 0.8  PROT 5.1* 4.8* 5.9* 5.3* 7.5    ALBUMIN 2.0* 1.9* 2.0* 2.0* 2.9*    Lab 04/19/12 0607  LIPASE 32  AMYLASE --   No results found for this basename: AMMONIA:5 in the last 168 hours  CBC:  Lab 04/24/12 0410 04/23/12 0955 04/21/12 0416 04/20/12 0520 04/19/12 1340 04/19/12 0607 04/18/12 2045  WBC 9.4 12.8* 10.4 12.8* 17.9* -- --  NEUTROABS -- -- 8.6* -- -- 16.6* 14.4*  HGB 7.9* 8.8* 9.5* 11.2* 10.4* -- --  HCT 22.6* 24.9* 26.7* 32.0* 29.7* -- --  MCV 100.0 98.4 96.0 97.0 99.3 -- --  PLT 85* 85* 63* 62* 67* -- --    Cardiac Enzymes:  Lab 04/21/12 1945 04/21/12 1302 04/21/12 0920 04/21/12 0100 04/20/12 1945  CKTOTAL -- -- -- -- --  CKMB -- -- -- -- --  CKMBINDEX -- -- -- -- --  TROPONINI <0.30 <0.30 <0.30 <0.30 <0.30   BNP (last 3 results) No results found for this basename: PROBNP:3 in the last 8760 hours   CBG:  Lab 04/24/12 0750 04/24/12 0434 04/24/12 0005 04/23/12 2056 04/23/12 1703  GLUCAP 85 89 117* 87 83    Recent Results (from the past 240 hour(s))  URINE CULTURE     Status: Normal   Collection Time   04/18/12 10:03 PM      Component Value Range Status Comment   Specimen Description URINE, CLEAN CATCH   Final    Special Requests NONE   Final    Culture  Setup Time 04/19/2012 06:02   Final    Colony Count >=100,000 COLONIES/ML   Final    Culture     Final    Value: KLEBSIELLA PNEUMONIAE     ESCHERICHIA COLI   Report Status 04/21/2012 FINAL   Final    Organism ID, Bacteria KLEBSIELLA PNEUMONIAE   Final    Organism ID, Bacteria ESCHERICHIA COLI   Final   CULTURE, BLOOD (ROUTINE X 2)     Status: Normal (Preliminary result)   Collection Time   04/19/12 12:01 AM      Component Value Range Status Comment   Specimen Description BLOOD PORTA CATH RIGHT   Final    Special Requests BOTTLES DRAWN AEROBIC AND ANAEROBIC UNKNOWN   Final    Culture  Setup Time 04/19/2012 04:05   Final    Culture     Final    Value:        BLOOD CULTURE RECEIVED NO GROWTH TO DATE CULTURE WILL BE HELD FOR 5 DAYS BEFORE  ISSUING A FINAL NEGATIVE REPORT   Report Status PENDING   Incomplete   CULTURE, BLOOD (ROUTINE X 2)     Status: Normal (Preliminary result)   Collection Time   04/19/12 12:45 AM      Component Value Range Status Comment   Specimen Description BLOOD RIGHT HAND   Final    Special Requests BOTTLES DRAWN AEROBIC ONLY [facility]   Final    Culture  Setup Time 04/19/2012 04:06   Final    Culture     Final    Value:  BLOOD CULTURE RECEIVED NO GROWTH TO DATE CULTURE WILL BE HELD FOR 5 DAYS BEFORE ISSUING A FINAL NEGATIVE REPORT   Report Status PENDING   Incomplete   MRSA PCR SCREENING     Status: Normal   Collection Time   04/19/12  9:00 AM      Component Value Range Status Comment   MRSA by PCR NEGATIVE  NEGATIVE Final   CLOSTRIDIUM DIFFICILE BY PCR     Status: Abnormal   Collection Time   04/19/12  9:45 AM      Component Value Range Status Comment   C difficile by pcr POSITIVE (*) NEGATIVE Final   STOOL CULTURE     Status: Normal   Collection Time   04/19/12  2:25 PM      Component Value Range Status Comment   Specimen Description PERIRECTAL   Final    Special Requests STOOL   Final    Culture     Final    Value: NO SALMONELLA, SHIGELLA, CAMPYLOBACTER, YERSINIA, OR E.COLI 0157:H7 ISOLATED   Report Status 04/23/2012 FINAL   Final      Studies: Ct Abdomen Pelvis Wo Contrast  04/19/2012  *RADIOLOGY REPORT*  Clinical Data: Suspected abdominal sepsis. History of lung cancer. Elevated liver functions.  Elevated renal function.  Elevated white count.  CT ABDOMEN AND PELVIS WITHOUT CONTRAST  Technique:  Multidetector CT imaging of the abdomen and pelvis was performed following the standard protocol without intravenous contrast.  Comparison: Prior PET scan 10/26/2011.  Prior CT abdomen pelvis for and 2012.  Findings: Left greater than right pleural effusions.  Mild bibasilar atelectasis. Right hilar mass incompletely evaluated. Central venous catheter.  Unenhanced appearance of the liver shows no  focal defects or dilated ducts.  Gallstones are seen in the gallbladder without wall thickening or pericholecystic fluid.  The right kidney is slightly enlarged with perinephric stranding. The left kidney is moderately enlarged with more perinephric stranding. Renal size has increased bilaterally, particularly the left, compared with prior cross-sectional imaging studies.  There is no visible renal mass or hydronephrosis.  Concern is raised for bilateral pyelonephritis, worse on the left.  Normal pancreas, adrenal glands, stomach, small bowel, and colon. Diverticulosis without diverticulitis.  No bowel obstruction.  Free pelvic fluid. Unremarkable uterus.  uterus.  No adnexal mass. Bladder decompressed by Foley catheter.  No worrisome osseous lesions.  Nonaneurysmal atherosclerotic calcification of the aorta.  IMPRESSION: Bilateral renal enlargement, left greater than right, with perinephric stranding.  Concern raised for pyelonephritis.  Free intra-abdominal fluid without free air.  No evidence for bowel obstruction or perforation.  Bilateral pleural effusions with right infrahilar opacity, likely mass. This could be slightly worse compared with previous PET scan.   Original Report Authenticated By: Elsie Stain, M.D.    US Renal  04/19/2012  *RADIOLOGY REPORT*  Clinical Data: Urinary tract infection.  Renal failure.  Increased creatinine.  History of lung cancer.  RENAL/URINARY TRACT ULTRASOUND COMPLETE  Comparison:  CT abdomen and pelvis 11/16/2010.  Findings:  Right Kidney:  Right kidney measures 12.2 cm length.  No hydronephrosis.  Normal parenchymal echotexture.  Left Kidney:  Left kidney measures 12.9 cm length.  No hydronephrosis.  Normal parenchymal echotexture.  Bladder:  The bladder is decompressed.  Limited visualization.  Incidental note of stones and sludge in the gallbladder without gallbladder wall thickening or edema.  No bile duct dilatation.  IMPRESSION: Normal ultrasound appearance of the  kidneys.  Decompressed bladder limits visualization.  Incidental note of  cholelithiasis with sludge in the gallbladder.   Original Report Authenticated By: Marlon Pel, M.D.    Dg Chest Port 1 View  04/20/2012  *RADIOLOGY REPORT*  Clinical Data: Infiltrates, possible pneumonia  PORTABLE CHEST - 1 VIEW  Comparison: Portable chest x-ray of 04/19/2012  Findings: There is more opacity at both lung bases which may reflect atelectasis or possibly pneumonia.  Also, there may be a small left effusion present.  Heart size is stable.  Right Port-A- Cath is unchanged in position.  IMPRESSION: Increase in basilar opacities left greater than right.  Cannot exclude pneumonia with possible left effusion.   Original Report Authenticated By: Juline Patch, M.D.    Dg Chest Port 1 View  04/19/2012  *RADIOLOGY REPORT*  Clinical Data: Fever.  PORTABLE CHEST - 1 VIEW  Comparison: 11/25/2010  Findings: Shallow inspiration.  Normal heart size and pulmonary vascularity.  Mass suggested in the right hilum with consolidation in the right mid and lower lung region.  Diffuse interstitial fibrosis in the lungs.  No blunting of costophrenic angles.  No pneumothorax.  There is improvement since previous study with no new infiltrates identified.  Interval placement of a power port type right central venous catheter with tip over the low SVC region.  IMPRESSION: Right hilar mass with right mid and lower lung consolidation. Diffuse interstitial changes.  Improved parenchymal infiltrates and effusions since previous study.   Original Report Authenticated By: Marlon Pel, M.D.     Scheduled Meds:   . ciprofloxacin  400 mg Intravenous Q24H  . diltiazem  180 mg Oral Daily  . feeding supplement  237 mL Oral BID BM  . metronidazole  500 mg Intravenous Q8H  . rOPINIRole  0.25 mg Oral QHS  . sodium chloride  3 mL Intravenous Q12H  . vancomycin  125 mg Oral Q6H  . DISCONTD: enoxaparin (LOVENOX) injection  40 mg Subcutaneous  Q24H   Continuous Infusions:   . sodium chloride    . DISCONTD: 0.9 % NaCl with KCl 40 mEq / L 75 mL/hr at 04/23/12 2120    Principal Problem:  *SIRS (systemic inflammatory response syndrome) Active Problems:  Atrial fibrillation  ARF (acute renal failure)  Diarrhea  Metabolic acidosis  LV dysfunction  Dehydration    Time spent: 40 minutes   [facility]  [facility] Pager [phone]. If 8PM-8AM, please contact night-coverage at [email or web address], password [facility] 04/24/2012, 9:39 AM  LOS: 6 days

## 2012-04-25 LAB — BASIC METABOLIC PANEL
BUN: 63 mg/dL — ABNORMAL HIGH (ref 6–23)
CO2: 16 mEq/L — ABNORMAL LOW (ref 19–32)
Calcium: 6.9 mg/dL — ABNORMAL LOW (ref 8.4–10.5)
Creatinine, Ser: 3.84 mg/dL — ABNORMAL HIGH (ref 0.50–1.10)
Glucose, Bld: 91 mg/dL (ref 70–99)

## 2012-04-25 LAB — CULTURE, BLOOD (ROUTINE X 2)
Culture: NO GROWTH
Culture: NO GROWTH

## 2012-04-25 LAB — CBC
HCT: 24.1 % — ABNORMAL LOW (ref 36.0–46.0)
MCHC: 34 g/dL (ref 30.0–36.0)
MCV: 100.4 fL — ABNORMAL HIGH (ref 78.0–100.0)
RDW: 15.5 % (ref 11.5–15.5)

## 2012-04-25 MED ORDER — HEPARIN SOD (PORK) LOCK FLUSH 100 UNIT/ML IV SOLN
500.0000 [IU] | INTRAVENOUS | Status: AC | PRN
  Administered 2012-04-25: 500 [IU]

## 2012-04-25 MED ORDER — VANCOMYCIN 50 MG/ML ORAL SOLUTION: ORAL | Status: DC

## 2012-04-25 NOTE — Discharge Summary (Signed)
Physician Discharge Summary  Madison [name] ION:[id number] DOB: [birth year] DOA: 04/18/2012  PCP: [name], MD  Admit date: 04/18/2012 Discharge date: 04/25/2012  Recommendations for Outpatient Follow-up:  1. Home health- PT/OT/RN 2. CBC/BMP on Monday  Discharge Diagnoses:  Principal Problem:  *SIRS (systemic inflammatory response syndrome) Active Problems:  Atrial fibrillation  ARF (acute renal failure)  Diarrhea  Metabolic acidosis  LV dysfunction  Dehydration   Discharge Condition: improved  Diet recommendation: cardiac  Filed Weights   04/21/12 0000 04/24/12 0437 04/25/12 0500  Weight: 71.5 kg (157 lb 10.1 oz) 65.1 kg (143 lb 8.3 oz) 70.217 kg (154 lb 12.8 oz)    History of present illness:  66 year-old female with known history of squamous cell lung cancer stage IIIa status post radiation and chemotherapy last dose October 2012, history of atrial fibrillation presently not on Coumadin, history of LV dysfunction with last EF measured was 40-45%, admitted last year for septic shock secondary to pneumonia and diarrhea, presents to the ER because of weakness and persistent diarrhea over the last one week. Patient has been having continuous diarrhea multiple times daily watery with no abdominal pain and had only one episode of vomiting last week. Patient has been having intense fatigue and weakness. Occasionally has subjective feeling of fever and chills. Patient is making urine. In the ER patient was found to have tachycardia but not hypotensive. Patient's creatinine has gone from normal to 6 with metabolic acidosis. In addition patient has mild leukocytosis. Patient will be admitted for further management. Patient denies any chest pain or shortness of breath.  Patient denies any recent travel, use of antibiotics or hospitalization.   Hospital Course:  c. difficile colitis:oral vancomycin for at least 2 weeks  Klebsiella/Escherichia coli UTI: ciprofloxacin treated  Acute  kidney injury likely secondary to severe C. difficile prerenal, creatinine improving very slowly, good urine output- encourage PO intake and monitor closely as outpatient Weakness- refused SNF placement- home with home health   Discharge Exam: Filed Vitals:   04/24/12 0437 04/24/12 2256 04/25/12 0500 04/25/12 0550  BP: 119/68 134/74  130/74  Pulse: 85 94  98  Temp: 98.1 F (36.7 C) 98.6 F (37 C)  97.8 F (36.6 C)  TempSrc: Oral Oral  Oral  Resp: 18 18  20   Height:      Weight: 65.1 kg (143 lb 8.3 oz)  70.217 kg (154 lb 12.8 oz)   SpO2: 99% 100%  100%    General: A+Ox3, NAD- wanting to go home Cardiovascular: rrr Respiratory: clear  Discharge Instructions      Discharge Orders    Future Appointments: Provider: Department: Dept Phone: Center:   05/01/2012 9:00 AM Windell Hummingbird [facility] [phone] None   05/01/2012 9:00 AM [facility] [phone] None   05/01/2012  9:30 AM [facility] [phone] [place]   05/04/2012 9:00 AM [facility] [phone] [facility]   05/08/2012 10:00 AM Samul Dada, MD [facility] [phone] None     Future Orders Please Complete By Expires   Diet - low sodium heart healthy      Increase activity slowly      Discharge instructions      Comments:   Cbc/bmp on Monday Drink lots of fluid Monitor urine output- if decreases, call PCP Home health PT/OT/RN       Medication List     As of 04/25/2012 12:31 PM    STOP taking these medications  hydrochlorothiazide 25 MG tablet   Commonly known as: HYDRODIURIL      TAKE these medications         cholecalciferol 1000 UNITS tablet   Commonly known as: VITAMIN D   Take 1,000 Units by mouth daily.      diltiazem 180 MG 24 hr capsule   Commonly known as: CARDIZEM CD   Take 180 mg by mouth daily.      folic acid 1 MG tablet   Commonly known as: FOLVITE   Take 1 mg by mouth daily.      iron  polysaccharides 150 MG capsule   Commonly known as: NIFEREX   Take 150 mg by mouth daily.      lidocaine-prilocaine cream   Commonly known as: EMLA   Apply 1 application topically as needed. For port-a-cath access.      loratadine 10 MG tablet   Commonly known as: CLARITIN   Take 10 mg by mouth daily.      multivitamin with minerals Tabs   Take 1 tablet by mouth daily.      thiamine 100 MG tablet   Commonly known as: VITAMIN B-1   Take 100 mg by mouth daily.      vancomycin 50 mg/mL oral solution   Commonly known as: VANCOCIN   2.64ml q 6 hours x 14 days           The results of significant diagnostics from this hospitalization (including imaging, microbiology, ancillary and laboratory) are listed below for reference.    Significant Diagnostic Studies: Ct Abdomen Pelvis Wo Contrast  04/19/2012  *RADIOLOGY REPORT*  Clinical Data: Suspected abdominal sepsis. History of lung cancer. Elevated liver functions.  Elevated renal function.  Elevated white count.  CT ABDOMEN AND PELVIS WITHOUT CONTRAST  Technique:  Multidetector CT imaging of the abdomen and pelvis was performed following the standard protocol without intravenous contrast.  Comparison: Prior PET scan 10/26/2011.  Prior CT abdomen pelvis for and 2012.  Findings: Left greater than right pleural effusions.  Mild bibasilar atelectasis. Right hilar mass incompletely evaluated. Central venous catheter.  Unenhanced appearance of the liver shows no focal defects or dilated ducts.  Gallstones are seen in the gallbladder without wall thickening or pericholecystic fluid.  The right kidney is slightly enlarged with perinephric stranding. The left kidney is moderately enlarged with more perinephric stranding. Renal size has increased bilaterally, particularly the left, compared with prior cross-sectional imaging studies.  There is no visible renal mass or hydronephrosis.  Concern is raised for bilateral pyelonephritis, worse on the left.   Normal pancreas, adrenal glands, stomach, small bowel, and colon. Diverticulosis without diverticulitis.  No bowel obstruction.  Free pelvic fluid. Unremarkable uterus.  uterus.  No adnexal mass. Bladder decompressed by Foley catheter.  No worrisome osseous lesions.  Nonaneurysmal atherosclerotic calcification of the aorta.  IMPRESSION: Bilateral renal enlargement, left greater than right, with perinephric stranding.  Concern raised for pyelonephritis.  Free intra-abdominal fluid without free air.  No evidence for bowel obstruction or perforation.  Bilateral pleural effusions with right infrahilar opacity, likely mass. This could be slightly worse compared with previous PET scan.   Original Report Authenticated By: Elsie Stain, M.D.    US Renal  04/19/2012  *RADIOLOGY REPORT*  Clinical Data: Urinary tract infection.  Renal failure.  Increased creatinine.  History of lung cancer.  RENAL/URINARY TRACT ULTRASOUND COMPLETE  Comparison:  CT abdomen and pelvis 11/16/2010.  Findings:  Right Kidney:  Right kidney measures 12.2 cm  length.  No hydronephrosis.  Normal parenchymal echotexture.  Left Kidney:  Left kidney measures 12.9 cm length.  No hydronephrosis.  Normal parenchymal echotexture.  Bladder:  The bladder is decompressed.  Limited visualization.  Incidental note of stones and sludge in the gallbladder without gallbladder wall thickening or edema.  No bile duct dilatation.  IMPRESSION: Normal ultrasound appearance of the kidneys.  Decompressed bladder limits visualization.  Incidental note of cholelithiasis with sludge in the gallbladder.   Original Report Authenticated By: Marlon Pel, M.D.    Dg Chest Port 1 View  04/20/2012  *RADIOLOGY REPORT*  Clinical Data: Infiltrates, possible pneumonia  PORTABLE CHEST - 1 VIEW  Comparison: Portable chest x-ray of 04/19/2012  Findings: There is more opacity at both lung bases which may reflect atelectasis or possibly pneumonia.  Also, there may be a small  left effusion present.  Heart size is stable.  Right Port-A- Cath is unchanged in position.  IMPRESSION: Increase in basilar opacities left greater than right.  Cannot exclude pneumonia with possible left effusion.   Original Report Authenticated By: Juline Patch, M.D.    Dg Chest Port 1 View  04/19/2012  *RADIOLOGY REPORT*  Clinical Data: Fever.  PORTABLE CHEST - 1 VIEW  Comparison: 11/25/2010  Findings: Shallow inspiration.  Normal heart size and pulmonary vascularity.  Mass suggested in the right hilum with consolidation in the right mid and lower lung region.  Diffuse interstitial fibrosis in the lungs.  No blunting of costophrenic angles.  No pneumothorax.  There is improvement since previous study with no new infiltrates identified.  Interval placement of a power port type right central venous catheter with tip over the low SVC region.  IMPRESSION: Right hilar mass with right mid and lower lung consolidation. Diffuse interstitial changes.  Improved parenchymal infiltrates and effusions since previous study.   Original Report Authenticated By: Marlon Pel, M.D.     Microbiology: Recent Results (from the past 240 hour(s))  URINE CULTURE     Status: Normal   Collection Time   04/18/12 10:03 PM      Component Value Range Status Comment   Specimen Description URINE, CLEAN CATCH   Final    Special Requests NONE   Final    Culture  Setup Time 04/19/2012 06:02   Final    Colony Count >=100,000 COLONIES/ML   Final    Culture     Final    Value: KLEBSIELLA PNEUMONIAE     ESCHERICHIA COLI   Report Status 04/21/2012 FINAL   Final    Organism ID, Bacteria KLEBSIELLA PNEUMONIAE   Final    Organism ID, Bacteria ESCHERICHIA COLI   Final   CULTURE, BLOOD (ROUTINE X 2)     Status: Normal   Collection Time   04/19/12 12:01 AM      Component Value Range Status Comment   Specimen Description BLOOD PORTA CATH RIGHT   Final    Special Requests BOTTLES DRAWN AEROBIC AND ANAEROBIC UNKNOWN   Final     Culture  Setup Time 04/19/2012 04:05   Final    Culture NO GROWTH 5 DAYS   Final    Report Status 04/25/2012 FINAL   Final   CULTURE, BLOOD (ROUTINE X 2)     Status: Normal   Collection Time   04/19/12 12:45 AM      Component Value Range Status Comment   Specimen Description BLOOD RIGHT HAND   Final    Special Requests BOTTLES DRAWN AEROBIC ONLY  [facility]   Final    Culture  Setup Time 04/19/2012 04:06   Final    Culture NO GROWTH 5 DAYS   Final    Report Status 04/25/2012 FINAL   Final   MRSA PCR SCREENING     Status: Normal   Collection Time   04/19/12  9:00 AM      Component Value Range Status Comment   MRSA by PCR NEGATIVE  NEGATIVE Final   CLOSTRIDIUM DIFFICILE BY PCR     Status: Abnormal   Collection Time   04/19/12  9:45 AM      Component Value Range Status Comment   C difficile by pcr POSITIVE (*) NEGATIVE Final   STOOL CULTURE     Status: Normal   Collection Time   04/19/12  2:25 PM      Component Value Range Status Comment   Specimen Description PERIRECTAL   Final    Special Requests STOOL   Final    Culture     Final    Value: NO SALMONELLA, SHIGELLA, CAMPYLOBACTER, YERSINIA, OR E.COLI 0157:H7 ISOLATED   Report Status 04/23/2012 FINAL   Final      Labs: Basic Metabolic Panel:  Lab 04/25/12 4540 04/24/12 0410 04/23/12 0540 04/22/12 0505 04/21/12 0416  NA 140 140 141 139 138  K 4.3 4.7 3.9 2.9* 3.5  CL 110 110 107 104 104  CO2 16* 17* 18* 22 21  GLUCOSE 91 84 85 93 106*  BUN 63* 75* 83* 90* 91*  CREATININE 3.84* 4.22* 4.44* 4.60* 4.74*  CALCIUM 6.9* 6.6* 6.8* 6.6* 6.8*  MG -- -- -- -- --  PHOS -- -- -- -- --   Liver Function Tests:  Lab 04/22/12 1456 04/22/12 0505 04/20/12 0520 04/19/12 0607 04/18/12 2045  AST 21 24 67* 77* 130*  ALT 23 23 44* 40* 63*  ALKPHOS 70 81 81 69 86  BILITOT 0.6 0.6 0.5 0.5 0.8  PROT 5.1* 4.8* 5.9* 5.3* 7.5  ALBUMIN 2.0* 1.9* 2.0* 2.0* 2.9*    Lab 04/19/12 0607  LIPASE 32  AMYLASE --   No results found for this basename:  AMMONIA:5 in the last 168 hours CBC:  Lab 04/24/12 0410 04/23/12 0955 04/21/12 0416 04/20/12 0520 04/19/12 1340 04/19/12 0607 04/18/12 2045  WBC 9.4 12.8* 10.4 12.8* 17.9* -- --  NEUTROABS -- -- 8.6* -- -- 16.6* 14.4*  HGB 7.9* 8.8* 9.5* 11.2* 10.4* -- --  HCT 22.6* 24.9* 26.7* 32.0* 29.7* -- --  MCV 100.0 98.4 96.0 97.0 99.3 -- --  PLT 85* 85* 63* 62* 67* -- --   Cardiac Enzymes:  Lab 04/21/12 1945 04/21/12 1302 04/21/12 0920 04/21/12 0100 04/20/12 1945  CKTOTAL -- -- -- -- --  CKMB -- -- -- -- --  CKMBINDEX -- -- -- -- --  TROPONINI <0.30 <0.30 <0.30 <0.30 <0.30   BNP: BNP (last 3 results) No results found for this basename: PROBNP:3 in the last 8760 hours CBG:  Lab 04/24/12 1820 04/24/12 1225 04/24/12 0750 04/24/12 0434 04/24/12 0005  GLUCAP 78 92 85 89 117*    Time coordinating discharge: 35 minutes  Signed:  Benjamine Mola, Yeilin Zweber  [facility] 04/25/2012, 12:31 PM

## 2012-04-25 NOTE — Progress Notes (Signed)
Physical Therapy Treatment Patient Details Name: Madison [name] MRN: [id number] DOB: 1945-12-17 Today's Date: 04/25/2012 Time: 4098-1191 PT Time Calculation (min): 28 min  PT Assessment / Plan / Recommendation Comments on Treatment Session  Pt doing well with mobility, distance limited by frequent diarrhea.  Pt performed standing BLE exercises.     Follow Up Recommendations  Skilled nursing facility (per chart, pt declining SNF and HHPT however)    Barriers to Discharge        Equipment Recommendations  None recommended by OT;None recommended by PT    Recommendations for Other Services    Frequency Min 3X/week   Plan Discharge plan remains appropriate;Frequency remains appropriate    Precautions / Restrictions Precautions Precautions: Fall Precaution Comments: bowel incontinence Restrictions Weight Bearing Restrictions: No   Pertinent Vitals/Pain *pt denied pain**    Mobility  Transfers Sit to Stand: With upper extremity assist;With armrests;From chair/3-in-1;4: Min guard Stand to Sit: To chair/3-in-1;With armrests;With upper extremity assist;4: Min guard Details for Transfer Assistance: min instructional cueing for hand placement Ambulation/Gait Ambulation/Gait Assistance: 5: Supervision Ambulation Distance (Feet): 40 Feet (20' x 2) Assistive device: Rolling walker Gait Pattern: Step-to pattern General Gait Details: no LOB, some assist required to negotiate obstacles with RW (pt had WIDE RW), at end of session exchanged wide  RW for standard    Exercises General Exercises - Lower Extremity Hip ABduction/ADduction: AROM;Both;5 reps;Standing Hip Flexion/Marching: AROM;20 reps;Standing Heel Raises: AROM;Both;5 reps;Standing Mini-Sqauts: AROM;Both;20 reps;Standing   PT Diagnosis:    PT Problem List:   PT Treatment Interventions:     PT Goals Acute Rehab PT Goals PT Goal Formulation: With patient Time For Goal Achievement: 04/30/12 Potential to Achieve Goals:  Good Pt will go Supine/Side to Sit: with modified independence Pt will go Sit to Stand: with supervision PT Goal: Sit to Stand - Progress: Progressing toward goal Pt will go Stand to Sit: with supervision PT Goal: Stand to Sit - Progress: Progressing toward goal Pt will Ambulate: >150 feet;with supervision;with least restrictive assistive device PT Goal: Ambulate - Progress: Progressing toward goal Pt will Perform Home Exercise Program: with supervision, verbal cues required/provided PT Goal: Perform Home Exercise Program - Progress: Progressing toward goal  Visit Information  Last PT Received On: 04/25/12 Assistance Needed: +1    Subjective Data  Subjective: I don't want to have diarrhea in the hallway.  Let's just walk in the room.  Patient Stated Goal: return to PLOF   Cognition  Overall Cognitive Status: Appears within functional limits for tasks assessed/performed Arousal/Alertness: Awake/alert Orientation Level: Appears intact for tasks assessed Behavior During Session: [facility] for tasks performed    Balance     End of Session PT - End of Session Activity Tolerance: Patient tolerated treatment well Patient left: in chair;with call bell/phone within reach;with chair alarm set   GP     Tamala Ser 04/25/2012, 10:54 AM [phone]

## 2012-04-25 NOTE — Progress Notes (Signed)
ANTIBIOTIC CONSULT NOTE - FOLLOW UP  Pharmacy Consult for Cipro Indication: gastroenteritis and UTI  Allergies  Allergen Reactions  . Lisinopril     Makes face swell   Patient Measurements: Height: 5\' 6"  (167.6 cm) Weight: 154 lb 12.8 oz (70.217 kg) IBW/kg (Calculated) : 59.3   Vital Signs: Temp: 97.8 F (36.6 C) (09/18 0550) Temp src: Oral (09/18 0550) BP: 130/74 mmHg (09/18 0550) Pulse Rate: 98  (09/18 0550)  Labs:  Basename 04/25/12 0530 04/24/12 0410 04/23/12 0955 04/23/12 0540  WBC -- 9.4 12.8* --  HGB -- 7.9* 8.8* --  PLT -- 85* 85* --  LABCREA -- -- -- --  CREATININE 3.84* 4.22* -- 4.44*   Estimated Creatinine Clearance: 13.3 ml/min (by C-G formula based on Cr of 3.84).  Microbiology: 9/12 blood x 2 >> ngtd 9/11 urine >> Ecoli (pan-sens), Kleb pneumo (R-nitrofurantoin only, S-amp/cefazolin/cipro/gent/septra) 9/12 c.diff: positive 9/12 stool cx: NGTD  Assessment:  71 YOF presented with diarrhea and SIRS likely 2/2 gastroenteritis and UTI.    Day #7 IV Cipro and Flagyl, #4 PO Vanc  C.diff PCR positive.   UTI with E.coli and K.pneumoniae, both cultures sensitive to Cipro.  ARF slowly resolving, SCr 3.84 today, CrCl~14 ml/min.  Antibiotic doses remain appropriate.  MD note 9/17 indicates Cipro/Flagyl will end today, will continue PO Vanc.  Goal of Therapy:  Doses adjusted per renal clearance Eradication of infection  Plan:   Continue Cipro 400 mg IV q24h  F/U plan to discontinue  Loralee Pacas, PharmD, BCPS Pager: [phone] 04/25/2012 10:37 AM

## 2012-04-30 ENCOUNTER — Other Ambulatory Visit: Payer: Medicare Other | Admitting: Lab

## 2012-05-01 ENCOUNTER — Encounter: Payer: Self-pay | Admitting: Oncology

## 2012-05-01 ENCOUNTER — Other Ambulatory Visit: Payer: Self-pay | Admitting: Nurse Practitioner

## 2012-05-01 ENCOUNTER — Other Ambulatory Visit (HOSPITAL_BASED_OUTPATIENT_CLINIC_OR_DEPARTMENT_OTHER): Payer: Medicare Other | Admitting: Lab

## 2012-05-01 ENCOUNTER — Ambulatory Visit: Payer: Medicare Other

## 2012-05-01 ENCOUNTER — Encounter (HOSPITAL_COMMUNITY)
Admission: RE | Admit: 2012-05-01 | Discharge: 2012-05-01 | Disposition: A | Discharge status: Home / Self Care | Payer: Medicare Other | Source: Ambulatory Visit | Attending: Oncology | Admitting: Oncology

## 2012-05-01 ENCOUNTER — Other Ambulatory Visit: Payer: Self-pay | Admitting: *Deleted

## 2012-05-01 ENCOUNTER — Other Ambulatory Visit: Payer: Self-pay

## 2012-05-01 ENCOUNTER — Ambulatory Visit (HOSPITAL_COMMUNITY)
Admission: RE | Admit: 2012-05-01 | Discharge: 2012-05-01 | Disposition: A | Discharge status: Home / Self Care | Payer: Medicare Other | Source: Ambulatory Visit | Attending: Family | Admitting: Family

## 2012-05-01 ENCOUNTER — Other Ambulatory Visit: Payer: Self-pay | Admitting: Family

## 2012-05-01 VITALS — BP 133/82 | HR 72 | Temp 97.1°F | Resp 20

## 2012-05-01 VITALS — BP 124/79 | HR 111 | Resp 20

## 2012-05-01 DIAGNOSIS — C3490 Malignant neoplasm of unspecified part of unspecified bronchus or lung: Secondary | ICD-10-CM

## 2012-05-01 DIAGNOSIS — N289 Disorder of kidney and ureter, unspecified: Secondary | ICD-10-CM

## 2012-05-01 DIAGNOSIS — D649 Anemia, unspecified: Secondary | ICD-10-CM

## 2012-05-01 DIAGNOSIS — R918 Other nonspecific abnormal finding of lung field: Secondary | ICD-10-CM | POA: Insufficient documentation

## 2012-05-01 DIAGNOSIS — J90 Pleural effusion, not elsewhere classified: Secondary | ICD-10-CM | POA: Insufficient documentation

## 2012-05-01 LAB — COMPREHENSIVE METABOLIC PANEL (CC13)
Alkaline Phosphatase: 70 U/L (ref 40–150)
BUN: 28 mg/dL — ABNORMAL HIGH (ref 7.0–26.0)
Glucose: 104 mg/dl — ABNORMAL HIGH (ref 70–99)
Total Bilirubin: 0.7 mg/dL (ref 0.20–1.20)

## 2012-05-01 LAB — CBC WITH DIFFERENTIAL/PLATELET
BASO%: 0.7 % (ref 0.0–2.0)
LYMPH%: 16.5 % (ref 14.0–49.7)
MCHC: 32.6 g/dL (ref 31.5–36.0)
MCV: 101.8 fL — ABNORMAL HIGH (ref 79.5–101.0)
MONO#: 0.8 10*3/uL (ref 0.1–0.9)
MONO%: 10.7 % (ref 0.0–14.0)
Platelets: 217 10*3/uL (ref 145–400)
RBC: 1.69 10*6/uL — ABNORMAL LOW (ref 3.70–5.45)
RDW: 15.9 % — ABNORMAL HIGH (ref 11.2–14.5)
WBC: 7.2 10*3/uL (ref 3.9–10.3)
nRBC: 0 % (ref 0–0)

## 2012-05-01 LAB — CEA: CEA: 0.6 ng/mL (ref 0.0–5.0)

## 2012-05-01 MED ORDER — SODIUM CHLORIDE 0.9 % IJ SOLN
10.0000 mL | INTRAMUSCULAR | Status: DC | PRN
  Administered 2012-05-01: 10 mL via INTRAVENOUS
  Filled 2012-05-01: qty 10

## 2012-05-01 MED ORDER — HEPARIN SOD (PORK) LOCK FLUSH 100 UNIT/ML IV SOLN
500.0000 [IU] | Freq: Every day | INTRAVENOUS | Status: DC | PRN
  Filled 2012-05-01: qty 5

## 2012-05-01 MED ORDER — ACETAMINOPHEN 325 MG PO TABS
650.0000 mg | ORAL_TABLET | Freq: Once | ORAL | Status: AC
  Administered 2012-05-01: 650 mg via ORAL

## 2012-05-01 MED ORDER — HEPARIN SOD (PORK) LOCK FLUSH 100 UNIT/ML IV SOLN
500.0000 [IU] | Freq: Once | INTRAVENOUS | Status: AC
  Administered 2012-05-01: 500 [IU] via INTRAVENOUS
  Filled 2012-05-01: qty 5

## 2012-05-01 MED ORDER — SODIUM CHLORIDE 0.9 % IJ SOLN
10.0000 mL | INTRAMUSCULAR | Status: DC | PRN
  Filled 2012-05-01: qty 10

## 2012-05-01 MED ORDER — DIPHENHYDRAMINE HCL 25 MG PO CAPS
25.0000 mg | ORAL_CAPSULE | Freq: Once | ORAL | Status: AC
  Administered 2012-05-01: 25 mg via ORAL

## 2012-05-01 MED ORDER — SODIUM CHLORIDE 0.9 % IV SOLN
250.0000 mL | Freq: Once | INTRAVENOUS | Status: AC
Start: 2012-05-01 — End: 2012-05-01
  Administered 2012-05-01: 250 mL via INTRAVENOUS

## 2012-05-01 NOTE — Progress Notes (Signed)
Accessed with power port needle for ct scan, good blood return, flushed.  dmr

## 2012-05-01 NOTE — Progress Notes (Signed)
Mrs. Hendry receive 2 units of packed red cells today without any problems.  CBC today showed a white count of 7.2, ANC 5.1, hemoglobin 5.6, hematocrit 17.2, platelet count of 217,000. On 04/25/2012, the hemoglobin was 8.2 hematocrit 24.1.  In addition, potassium was 3.4, BUN 28.0 and creatinine 3.2. Albumin was 2.8.  The patient had a CT scan of the chest without IV contrast today. She is due to see me for an appointment on October 1.  Given the dramatic drop in her hemoglobin from 8.2 to 5.6 since 04/25/2012, we  need to check stools for occult blood and recheck labs on Friday, 05/04/2012.

## 2012-05-01 NOTE — Patient Instructions (Addendum)
Blood Transfusion Information  WHAT IS A BLOOD TRANSFUSION?  A transfusion is the replacement of blood or some of its parts. Blood is made up of multiple cells which provide different functions.   Red blood cells carry oxygen and are used for blood loss replacement.   White blood cells fight against infection.   Platelets control bleeding.   Plasma helps clot blood.   Other blood products are available for specialized needs, such as hemophilia or other clotting disorders.  BEFORE THE TRANSFUSION   Who gives blood for transfusions?    You may be able to donate blood to be used at a later date on yourself (autologous donation).   Relatives can be asked to donate blood. This is generally not any safer than if you have received blood from a stranger. The same precautions are taken to ensure safety when a relative's blood is donated.   Healthy volunteers who are fully evaluated to make sure their blood is safe. This is blood bank blood.  Transfusion therapy is the safest it has ever been in the practice of medicine. Before blood is taken from a donor, a complete history is taken to make sure that person has no history of diseases nor engages in risky social behavior (examples are intravenous drug use or sexual activity with multiple partners). The donor's travel history is screened to minimize risk of transmitting infections, such as malaria. The donated blood is tested for signs of infectious diseases, such as HIV and hepatitis. The blood is then tested to be sure it is compatible with you in order to minimize the chance of a transfusion reaction. If you or a relative donates blood, this is often done in anticipation of surgery and is not appropriate for emergency situations. It takes many days to process the donated blood.  RISKS AND COMPLICATIONS  Although transfusion therapy is very safe and saves many lives, the main dangers of transfusion include:    Getting an infectious disease.   Developing a  transfusion reaction. This is an allergic reaction to something in the blood you were given. Every precaution is taken to prevent this.  The decision to have a blood transfusion has been considered carefully by your caregiver before blood is given. Blood is not given unless the benefits outweigh the risks.  AFTER THE TRANSFUSION   Right after receiving a blood transfusion, you will usually feel much better and more energetic. This is especially true if your red blood cells have gotten low (anemic). The transfusion raises the level of the red blood cells which carry oxygen, and this usually causes an energy increase.   The nurse administering the transfusion will monitor you carefully for complications.  HOME CARE INSTRUCTIONS   No special instructions are needed after a transfusion. You may find your energy is better. Speak with your caregiver about any limitations on activity for underlying diseases you may have.  SEEK MEDICAL CARE IF:    Your condition is not improving after your transfusion.   You develop redness or irritation at the intravenous (IV) site.  SEEK IMMEDIATE MEDICAL CARE IF:   Any of the following symptoms occur over the next 12 hours:   Shaking chills.   You have a temperature by mouth above 102 F (38.9 C), not controlled by medicine.   Chest, back, or muscle pain.   People around you feel you are not acting correctly or are confused.   Shortness of breath or difficulty breathing.   Dizziness and fainting.     You get a rash or develop hives.   You have a decrease in urine output.   Your urine turns a dark color or changes to pink, red, or brown.  Any of the following symptoms occur over the next 10 days:   You have a temperature by mouth above 102 F (38.9 C), not controlled by medicine.   Shortness of breath.   Weakness after normal activity.   The white part of the eye turns yellow (jaundice).   You have a decrease in the amount of urine or are urinating less often.   Your  urine turns a dark color or changes to pink, red, or brown.  Document Released: 07/22/2000 Document Revised: 07/14/2011 Document Reviewed: 03/10/2008  [employer] Patient Information 2012 [employer].

## 2012-05-02 ENCOUNTER — Other Ambulatory Visit: Payer: Self-pay | Admitting: Medical Oncology

## 2012-05-02 ENCOUNTER — Telehealth: Payer: Self-pay | Admitting: Oncology

## 2012-05-02 LAB — TYPE AND SCREEN
ABO/RH(D): O POS
Antibody Screen: NEGATIVE
Unit division: 0

## 2012-05-02 NOTE — Telephone Encounter (Signed)
called pt to reschedule lab needed to be done    By Collier Salina

## 2012-05-02 NOTE — Telephone Encounter (Signed)
s.w. pt and she wanted her lab the day of her appt bc she had mammogram on 9.27.13....made appt with urology which is 10.10.13 @ 10:30am with Dr. Margarita Grizzle....gave pt d.t for OCT schedule....sed

## 2012-05-03 ENCOUNTER — Other Ambulatory Visit: Payer: Self-pay | Admitting: Medical Oncology

## 2012-05-03 ENCOUNTER — Encounter: Payer: Self-pay | Admitting: Medical Oncology

## 2012-05-04 ENCOUNTER — Ambulatory Visit
Admission: RE | Admit: 2012-05-04 | Discharge: 2012-05-04 | Disposition: A | Discharge status: Home / Self Care | Payer: Medicare Other | Source: Ambulatory Visit | Attending: Internal Medicine | Admitting: Internal Medicine

## 2012-05-04 ENCOUNTER — Telehealth: Payer: Self-pay | Admitting: *Deleted

## 2012-05-04 ENCOUNTER — Telehealth: Payer: Self-pay | Admitting: Medical Oncology

## 2012-05-04 ENCOUNTER — Other Ambulatory Visit: Payer: Medicare Other | Admitting: Lab

## 2012-05-04 ENCOUNTER — Other Ambulatory Visit (HOSPITAL_BASED_OUTPATIENT_CLINIC_OR_DEPARTMENT_OTHER): Payer: Medicare Other | Admitting: Lab

## 2012-05-04 DIAGNOSIS — Z1231 Encounter for screening mammogram for malignant neoplasm of breast: Secondary | ICD-10-CM

## 2012-05-04 DIAGNOSIS — D649 Anemia, unspecified: Secondary | ICD-10-CM

## 2012-05-04 LAB — CBC & DIFF AND RETIC
BASO%: 0.2 % (ref 0.0–2.0)
LYMPH%: 19.1 % (ref 14.0–49.7)
MCHC: 33.7 g/dL (ref 31.5–36.0)
MCV: 98.8 fL (ref 79.5–101.0)
MONO#: 0.7 10*3/uL (ref 0.1–0.9)
MONO%: 11.5 % (ref 0.0–14.0)
Platelets: 164 10*3/uL (ref 145–400)
RBC: 3.45 10*6/uL — ABNORMAL LOW (ref 3.70–5.45)
RDW: 16.4 % — ABNORMAL HIGH (ref 11.2–14.5)
Retic %: 0.99 % (ref 0.70–2.10)
Retic Ct Abs: 34.16 10*3/uL (ref 33.70–90.70)
WBC: 6.4 10*3/uL (ref 3.9–10.3)

## 2012-05-04 LAB — BASIC METABOLIC PANEL (CC13)
BUN: 19 mg/dL (ref 7.0–26.0)
Calcium: 8.2 mg/dL — ABNORMAL LOW (ref 8.4–10.4)
Chloride: 116 mEq/L — ABNORMAL HIGH (ref 98–107)
Creatinine: 2.8 mg/dL — ABNORMAL HIGH (ref 0.6–1.1)

## 2012-05-04 NOTE — Telephone Encounter (Signed)
Gave patient 05-08-2012 starting at 9:30am

## 2012-05-04 NOTE — Telephone Encounter (Signed)
I called pt per Dr. Arline Asp to let her know that her potassium is 3.4. He would like for pt for eat bananas and orange juice. She voiced understanding.

## 2012-05-07 ENCOUNTER — Other Ambulatory Visit: Payer: Medicare Other | Admitting: Lab

## 2012-05-08 ENCOUNTER — Other Ambulatory Visit: Payer: Medicare Other | Admitting: Lab

## 2012-05-08 ENCOUNTER — Telehealth: Payer: Self-pay | Admitting: Oncology

## 2012-05-08 ENCOUNTER — Other Ambulatory Visit: Payer: Self-pay | Admitting: Medical Oncology

## 2012-05-08 ENCOUNTER — Encounter: Payer: Self-pay | Admitting: Medical Oncology

## 2012-05-08 ENCOUNTER — Other Ambulatory Visit (HOSPITAL_BASED_OUTPATIENT_CLINIC_OR_DEPARTMENT_OTHER): Payer: Medicare Other | Admitting: Lab

## 2012-05-08 ENCOUNTER — Ambulatory Visit (HOSPITAL_BASED_OUTPATIENT_CLINIC_OR_DEPARTMENT_OTHER): Payer: Medicare Other | Admitting: Oncology

## 2012-05-08 ENCOUNTER — Encounter: Payer: Self-pay | Admitting: Oncology

## 2012-05-08 ENCOUNTER — Other Ambulatory Visit: Payer: Self-pay | Admitting: Internal Medicine

## 2012-05-08 VITALS — BP 130/82 | HR 102 | Temp 96.7°F | Resp 18 | Ht 66.0 in | Wt 139.9 lb

## 2012-05-08 DIAGNOSIS — G609 Hereditary and idiopathic neuropathy, unspecified: Secondary | ICD-10-CM

## 2012-05-08 DIAGNOSIS — D649 Anemia, unspecified: Secondary | ICD-10-CM

## 2012-05-08 DIAGNOSIS — N289 Disorder of kidney and ureter, unspecified: Secondary | ICD-10-CM

## 2012-05-08 DIAGNOSIS — C3490 Malignant neoplasm of unspecified part of unspecified bronchus or lung: Secondary | ICD-10-CM

## 2012-05-08 DIAGNOSIS — R928 Other abnormal and inconclusive findings on diagnostic imaging of breast: Secondary | ICD-10-CM

## 2012-05-08 DIAGNOSIS — C3410 Malignant neoplasm of upper lobe, unspecified bronchus or lung: Secondary | ICD-10-CM

## 2012-05-08 LAB — CBC WITH DIFFERENTIAL/PLATELET
BASO%: 0.8 % (ref 0.0–2.0)
EOS%: 3.3 % (ref 0.0–7.0)
LYMPH%: 26.4 % (ref 14.0–49.7)
MCHC: 33 g/dL (ref 31.5–36.0)
MCV: 103.7 fL — ABNORMAL HIGH (ref 79.5–101.0)
MONO%: 16.9 % — ABNORMAL HIGH (ref 0.0–14.0)
Platelets: 209 10*3/uL (ref 145–400)
RBC: 3.39 10*6/uL — ABNORMAL LOW (ref 3.70–5.45)

## 2012-05-08 LAB — BASIC METABOLIC PANEL (CC13)
Calcium: 8.8 mg/dL (ref 8.4–10.4)
Sodium: 144 mEq/L (ref 136–145)

## 2012-05-08 MED ORDER — POTASSIUM CHLORIDE CRYS ER 20 MEQ PO TBCR: EXTENDED_RELEASE_TABLET | ORAL | Status: DC

## 2012-05-08 NOTE — Telephone Encounter (Signed)
appts made and printed for pt aom °

## 2012-05-08 NOTE — Progress Notes (Signed)
This office note has been dictated.  #[id number]

## 2012-05-08 NOTE — Progress Notes (Signed)
CC:   Georgann Housekeeper, MD [name], M.D. [name], MD [name], MD  PROBLEM LIST.:  1. 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 The patient  had a right pleural effusion at that time that was cytologically  negative. She received radiation treatments under the direction of  Dr. Margaretmary Dys, 6600 cGy in 33 fractions from January 27, 2011  through March 15, 2011, in combination with weekly carboplatin and  Taxotere. She was started on consolidative treatment with reduced  doses of carboplatin, gemcitabine, and Neulasta on May 12, 2011.  She has had significant thrombocytopenia not requiring transfusions.  Patient completed 4 cycles of consolidative chemotherapy, completed on 07/14/11.  Neulasta was omitted from cycle 4.  2. Atrial fibrillation.  3. Peripheral neuropathy of feet and hands since 1998.  4. Hypertension.  5. Incontinence of urine.  6. History of shock, diarrhea, metabolic acidosis, and renal failure,  requiring admission to the ICU in April 2012.  7. History of left foot drop.  8. Anemia.  9. Renal insufficiency.  10. Right port-a-cath placed 01/20/11. 11. Admission to the hospital from 04/18/2012 through 04/25/2012 with     diarrhea, dehydration, worsening of renal insufficiency secondary     to Clostridium difficile colitis.  The patient is about to finish     up her oral vancomycin.  There was no history of prior antibiotic     exposure. 12. Anemia with unexplained drop in the patient's hemoglobin from 8.2     on 04/25/2012 to 5.6 on 05/01/2012.  The patient received 2 units     of packed red cells.  Stool cards on 05/08/2012 were negative for     occult blood.  In part, the anemia may be secondary to the     patient's underlying renal insufficiency.   MEDICATIONS:  1. Vitamin D 1000 units.  2. Daily Cardizem CD 180 mg daily.  3. Folic acid 1 mg daily.  4.  EMLA cream.  5. Claritin 10 mg daily.  6. Poly-Iron 150 mg twice a day.  7. Thiamine 100 mg daily.  8. The patient has Compazine, Zofran and Imodium to use as needed.  9. The patient is currently on vancomycin oral solution, which she     will finish today, 05/08/2012, after a 14-day course.   SMOKING HISTORY:  The patient smoked a pack of cigarettes a day for 30 years.  She stopped smoking in April 2012 at the time of her lung cancer Diagnosis.   HISTORY:  Madison [name] was seen today for followup of her stage IIIA squamous cell carcinoma involving the right upper lobe with diagnosis established in early April 2012.  Ms. Roma is here today with her husband.  She was last seen by [place] on 03/05/2012 and prior to that on 12/30/2011.  The patient required admission to the hospital from 04/18/2012 through 04/25/2012 with problems related to C difficile colitis.  She had renal insufficiency, acute on chronic.  She has made a nice recovery.  The patient came into the office on 05/01/2012 for blood work and was noted to have a hemoglobin of 5.6, whereas the hemoglobin had been 8.2 at the time of discharge on 04/25/2012.  In addition, BUN was 28.0, creatinine 3.2, albumin 2.8.  BUN and creatinine actually were improving.  The patient required 2 units of packed red cells on 05/01/2012.  Stool cards were negative for occult blood today,  05/08/2012.  The patient is without any major complaints today.  She is troubled primarily by urinary incontinence and her neuropathy.  These are old problems.  I should mention that the patient did undergo a CT scan of the chest without IV contrast as followup for her lung cancer.  There was no evidence for disease.  I have reviewed that CT scan.  Scan was compared with the CT scan of 10/26/2011.  The patient also has had bilateral screening mammograms on 05/04/2012.  There were some calcifications of the left breast that require further evaluation.   As stated, the patient is without any other complaints today and seems to be doing fairly well.  PHYSICAL EXAMINATION:  She looks well.  Weight is 139.9 pounds, height 5 feet 6 inches, body surface area 1.72 sq. m.  Blood pressure 130/82. Pulse is 100 and regular.  Respirations regular and unlabored.  She is afebrile.  There is no scleral icterus.  Mouth and pharynx are benign. There is no peripheral adenopathy palpable.  Heart and lungs:  Normal. Rhythm is regular.  The patient is no longer on Coumadin.  I believe this was stopped about 3 or 4 months ago.  Breasts:  Somewhat pendulous, otherwise unremarkable.  No masses, dimpling, nipple inversion.  No axillary adenopathy.  There is a right-sided Port-A-Cath that was placed on 01/20/2011 and flushed with heparin about a week ago when the patient received blood.  Abdomen with the patient sitting is benign. Extremities:  Left ankle shows trace to 1+ edema.  Neurologic:  Grossly normal.  The patient has had a left foot drop.  LABORATORY DATA:  Today, white count 4.3, ANC 2.3, hemoglobin 11.6, hematocrit 35.1, platelets 209,000.  Chemistries today consisting of a BMET notable for a BUN of 17.0, creatinine 2.7.  BUN and creatinine continue to slowly improve.  Potassium was 3.4 today.  It will be recommended that the patient take 20 mEq of potassium today and tomorrow and then stop.  On 05/01/2012, BUN was 28, creatinine 3.2, potassium 3.4, albumin 2.8.  Liver function tests including LDH were normal.  CEA on 05/01/2012 was 0.6.  IMAGING STUDIES:  1. PET scan from 12/30/2010 showed abnormal uptake within the upper  cervical spine and adjacent skull base which is incompletely  visualized. There was low level abnormal right perihilar uptake  compatible with the patient's diagnosis of lung cancer diagnosed at  bronchoscopy. Tumor involved the right upper lobe. An addendum to  the report stated that there were some alternative explanations  for  the activity in the cervical spine that has more to do with  misregistration of data than the possibility of metastatic disease.  There was also noted that there was a low-grade activity in the  mediastinal adenopathy that was shown on the CT scan. The right  lower paratracheal lymph node measured 1.7 x 2.1 cm. There was a  2.4 x 2.1 cm precarinal lymph node, and a 3.3 x 2.1 cm subcarinal  lymph node.  2. CT scan of the chest without IV contrast from 04/19/2011 showed  improved appearance of the right chest compared with the PET scan  of 12/30/2010 and the CT scan of the chest from 11/10/2010. Right  upper lobe dense consolidation had resolved. Bilateral pleural  effusions had resolved. There was a decrease in the adenopathy  seen in the chest. There was an indeterminate right upper lobe  nodule that warranted further followup.  3. PET scan from 10/26/2011 showed interval resolution of  the  hypermetabolic right hilar lymphadenopathy as well as near-complete  resolution of the mediastinal lymphadenopathy which showed no  significant hypermetabolic activity. There was no evidence for  extra-thoracic metastatic disease. It was noted also that the  activity seen in the region of the skull base and C1 was no longer  visualized and was felt to be muscular artifact from the prior PET  scan of 12/30/2010. In reviewing this PET scan, it was suggested  that CT scans of the chest, ideally with IV contrast if patient's  renal function will allow, may give [place] the best method for followup. 4. CT scan of the chest without IV contrast on 05/01/2012 showed     stable borderline enlarged lower right paratracheal lymph node     measuring 1.2 cm.  There were some tiny nodules within the right     upper lobe that are stable compared with previous exams.     Comparison was made with the CT scan of 10/26/2011. 5. Digital bilateral screening mammogram on 05/04/2012 showed     calcifications in the left  breast.  Further evaluation was     suggested.   IMPRESSION AND PLAN:  Ms. Muela is now out about a year and a half from the time of diagnosis of her squamous cell carcinoma of the right upper lobe, stage IIIA.  There is no sign of recurrent disease at this time although the patient, unfortunately, remains at high risk for recurrence.  The patient seems to be doing well following recent problems as described above.  I have advised her to follow up on her mammogram.  We will try to make an appointment for her to see Dr. Fidela Salisbury, whom she has seen in the past regarding her renal disease.  I have suggested the patient talk with Dr. Clent Ridges about Pneumovax and flu shot.  We will continue to flush the Port-A-Cath every 2 months.  We are also going to arrange for the patient to have a couple of doses of potassium chloride 20 mEq daily for 2 days.  The patient is no longer having diarrhea, nausea or vomiting. She says her stools are soft.  Will plan to check CBC and BMET on the patient in 3 weeks and plan to see her again in 6 weeks, at which time we will check CBC and chemistries, including an LDH.  The patient will be due for a Port-A- Cath flush at that time.  She can see Norina Buzzard, nurse practitioner, on her next visit in 6 weeks.    ______________________________ Samul Dada, M.D. DSM/MEDQ  D:  05/08/2012  T:  05/08/2012  Job:  [id number]

## 2012-05-08 NOTE — Patient Instructions (Addendum)
Need to follow-up on abnormal left mammogram.  To arrange appointment with Dr. Fidela Salisbury.  I think you have seen him in the past.  Ask Dr, Clent Ridges about pneumonia and flu shots.  Continue to flush port with heparin about every 2 months.

## 2012-05-09 ENCOUNTER — Telehealth: Payer: Self-pay | Admitting: Medical Oncology

## 2012-05-09 NOTE — Telephone Encounter (Signed)
I called pt regarding an appt with [facility]. Dr. Arline Asp wanted her to follow up with Dr[facility]. We received a form requesting records for an appt at [facility]. She states she made that appt and she realized it was the wrong place. I told her I can cancel this appt and get her an appt with Dr. Ivor Messier. She was in agreement. I called [facility] and cancelled the appt.

## 2012-05-09 NOTE — Telephone Encounter (Signed)
Madison [name] called from Dr. [name] office to let us know that pt has not been seen there since 2011. Dr. Arline Asp has been sending notes to Dr. Briant Cedar but Dr. [name] has seen pt in the past. She requested office notes and labs and she will review with Dr. [name] and set up appt. Labs and notes faxed.

## 2012-05-09 NOTE — Telephone Encounter (Signed)
I called Dr. Edd Arbour office and left a message requesting  an appt for pt. She has seen Dr. Briant Cedar in the past and needs follow up for renal insufficiency. I asked them to either call us with app or pt. I informed the pt and asked her to call us if she does not hear from them.

## 2012-05-14 ENCOUNTER — Ambulatory Visit
Admission: RE | Admit: 2012-05-14 | Discharge: 2012-05-14 | Disposition: A | Discharge status: Home / Self Care | Payer: Medicare Other | Source: Ambulatory Visit | Attending: Internal Medicine | Admitting: Internal Medicine

## 2012-05-14 DIAGNOSIS — R928 Other abnormal and inconclusive findings on diagnostic imaging of breast: Secondary | ICD-10-CM

## 2012-05-17 ENCOUNTER — Telehealth: Payer: Self-pay

## 2012-05-17 NOTE — Telephone Encounter (Signed)
Larita Fife from [facility] called to let us know pt has an appt with them on 06/01/12

## 2012-05-28 ENCOUNTER — Telehealth: Payer: Self-pay | Admitting: Oncology

## 2012-05-28 NOTE — Telephone Encounter (Signed)
called [facility] and s/w pam,pt has an appt 06/01/12 @10 :00 and pt is aware.  kathy b filled out ref. to [facility],

## 2012-05-29 ENCOUNTER — Other Ambulatory Visit (HOSPITAL_BASED_OUTPATIENT_CLINIC_OR_DEPARTMENT_OTHER): Payer: Medicare Other | Admitting: Lab

## 2012-05-29 DIAGNOSIS — C3490 Malignant neoplasm of unspecified part of unspecified bronchus or lung: Secondary | ICD-10-CM

## 2012-05-29 DIAGNOSIS — D649 Anemia, unspecified: Secondary | ICD-10-CM

## 2012-05-29 DIAGNOSIS — N289 Disorder of kidney and ureter, unspecified: Secondary | ICD-10-CM

## 2012-05-29 LAB — CBC WITH DIFFERENTIAL/PLATELET
Basophils Absolute: 0 10*3/uL (ref 0.0–0.1)
Eosinophils Absolute: 0.1 10*3/uL (ref 0.0–0.5)
LYMPH%: 31.4 % (ref 14.0–49.7)
MCH: 33.9 pg (ref 25.1–34.0)
MCHC: 33.8 g/dL (ref 31.5–36.0)
MONO#: 0.7 10*3/uL (ref 0.1–0.9)
MONO%: 13.5 % (ref 0.0–14.0)
RBC: 3.17 10*6/uL — ABNORMAL LOW (ref 3.70–5.45)
RDW: 16 % — ABNORMAL HIGH (ref 11.2–14.5)
lymph#: 1.6 10*3/uL (ref 0.9–3.3)

## 2012-05-29 LAB — BASIC METABOLIC PANEL (CC13)
BUN: 31 mg/dL — ABNORMAL HIGH (ref 7.0–26.0)
CO2: 15 mEq/L — ABNORMAL LOW (ref 22–29)
Calcium: 9.9 mg/dL (ref 8.4–10.4)
Glucose: 89 mg/dl (ref 70–99)
Sodium: 141 mEq/L (ref 136–145)

## 2012-05-30 ENCOUNTER — Other Ambulatory Visit: Payer: Self-pay | Admitting: Medical Oncology

## 2012-05-30 ENCOUNTER — Other Ambulatory Visit (HOSPITAL_COMMUNITY): Payer: Self-pay | Admitting: Oncology

## 2012-05-30 DIAGNOSIS — C3430 Malignant neoplasm of lower lobe, unspecified bronchus or lung: Secondary | ICD-10-CM

## 2012-05-30 DIAGNOSIS — D649 Anemia, unspecified: Secondary | ICD-10-CM

## 2012-05-30 MED ORDER — POLYSACCHARIDE IRON COMPLEX 150 MG PO CAPS: 150.0000 mg | ORAL_CAPSULE | Freq: Two times a day (BID) | ORAL | Status: DC

## 2012-06-11 ENCOUNTER — Telehealth: Payer: Self-pay | Admitting: Oncology

## 2012-06-11 NOTE — Telephone Encounter (Signed)
Changed time of 11/12 appt per desk nurse. S/w pt and per pt moved 11/12 appt to 11/21 due to she has back to back appts that week. Pt has new d/t for 11/21.

## 2012-06-19 ENCOUNTER — Other Ambulatory Visit: Payer: Medicare Other | Admitting: Lab

## 2012-06-19 ENCOUNTER — Ambulatory Visit: Payer: Medicare Other | Admitting: Family

## 2012-06-22 ENCOUNTER — Telehealth: Payer: Self-pay

## 2012-06-22 NOTE — Telephone Encounter (Signed)
Received call from [facility] at Dr Claris Che office that labs Dr Clent Ridges requested were to be drawn with next scheduled appt. They are reducing sticks for the pt. Faxed order was forwarded to lab and note placed in lab appt entry.

## 2012-06-28 ENCOUNTER — Other Ambulatory Visit: Payer: Self-pay | Admitting: Oncology

## 2012-06-28 ENCOUNTER — Other Ambulatory Visit (HOSPITAL_BASED_OUTPATIENT_CLINIC_OR_DEPARTMENT_OTHER): Payer: Medicare Other | Admitting: Lab

## 2012-06-28 ENCOUNTER — Other Ambulatory Visit: Payer: Self-pay | Admitting: Medical Oncology

## 2012-06-28 ENCOUNTER — Telehealth: Payer: Self-pay | Admitting: Oncology

## 2012-06-28 ENCOUNTER — Encounter: Payer: Self-pay | Admitting: Family

## 2012-06-28 ENCOUNTER — Ambulatory Visit: Payer: Medicare Other

## 2012-06-28 ENCOUNTER — Ambulatory Visit (HOSPITAL_BASED_OUTPATIENT_CLINIC_OR_DEPARTMENT_OTHER): Payer: Medicare Other | Admitting: Family

## 2012-06-28 VITALS — BP 160/82 | HR 94 | Temp 97.5°F

## 2012-06-28 VITALS — BP 166/91 | HR 110 | Temp 96.9°F | Resp 20 | Ht 66.0 in | Wt 140.8 lb

## 2012-06-28 DIAGNOSIS — C3410 Malignant neoplasm of upper lobe, unspecified bronchus or lung: Secondary | ICD-10-CM

## 2012-06-28 DIAGNOSIS — N6320 Unspecified lump in the left breast, unspecified quadrant: Secondary | ICD-10-CM | POA: Insufficient documentation

## 2012-06-28 DIAGNOSIS — C3490 Malignant neoplasm of unspecified part of unspecified bronchus or lung: Secondary | ICD-10-CM

## 2012-06-28 DIAGNOSIS — R921 Mammographic calcification found on diagnostic imaging of breast: Secondary | ICD-10-CM

## 2012-06-28 DIAGNOSIS — D649 Anemia, unspecified: Secondary | ICD-10-CM

## 2012-06-28 DIAGNOSIS — N289 Disorder of kidney and ureter, unspecified: Secondary | ICD-10-CM

## 2012-06-28 DIAGNOSIS — R928 Other abnormal and inconclusive findings on diagnostic imaging of breast: Secondary | ICD-10-CM

## 2012-06-28 LAB — COMPREHENSIVE METABOLIC PANEL (CC13)
AST: 24 U/L (ref 5–34)
Alkaline Phosphatase: 108 U/L (ref 40–150)
BUN: 57 mg/dL — ABNORMAL HIGH (ref 7.0–26.0)
Creatinine: 3.2 mg/dL (ref 0.6–1.1)

## 2012-06-28 LAB — CBC WITH DIFFERENTIAL/PLATELET
BASO%: 0.5 % (ref 0.0–2.0)
Basophils Absolute: 0 10*3/uL (ref 0.0–0.1)
EOS%: 0.7 % (ref 0.0–7.0)
HGB: 9.7 g/dL — ABNORMAL LOW (ref 11.6–15.9)
MCH: 34.3 pg — ABNORMAL HIGH (ref 25.1–34.0)
MCHC: 34.8 g/dL (ref 31.5–36.0)
MCV: 98.5 fL (ref 79.5–101.0)
MONO%: 7.8 % (ref 0.0–14.0)
NEUT%: 68.1 % (ref 38.4–76.8)
RDW: 15.6 % — ABNORMAL HIGH (ref 11.2–14.5)

## 2012-06-28 MED ORDER — SODIUM CHLORIDE 0.9 % IJ SOLN
10.0000 mL | INTRAMUSCULAR | Status: DC | PRN
  Administered 2012-06-28: 10 mL via INTRAVENOUS
  Filled 2012-06-28: qty 10

## 2012-06-28 MED ORDER — HEPARIN SOD (PORK) LOCK FLUSH 100 UNIT/ML IV SOLN
500.0000 [IU] | Freq: Once | INTRAVENOUS | Status: AC
  Administered 2012-06-28: 500 [IU] via INTRAVENOUS
  Filled 2012-06-28: qty 5

## 2012-06-28 NOTE — Patient Instructions (Addendum)
Please contact us if you have any questions or concerns. Please get the flu and pneumonia vaccines as soon as possible Please get left breast biopsy as soon as possible. Monitor blood pressure and report elevated blood pressure to Dr. Clent Ridges (166/91) We will let Dr. [name] know about you elevated kidney laboratories

## 2012-06-28 NOTE — Telephone Encounter (Signed)
appts made and printed for pt aom pt aware that i will call with [facility] appt

## 2012-06-28 NOTE — Progress Notes (Signed)
Patient ID: Madison [name], female   DOB: [birth year], 66 y.o.   MRN: [id number] CSN: [id number]  CC: Dr. Rosezetta Schlatter, MD [name], MD [name], MD [name], MD [name] [name], MD  Problem List: Madison [name] is a 66 y.o. African-American female with a past medical history significant for:  1. 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 The patient  had a right pleural effusion at that time that was cytologically negative. She received radiation treatments under the direction of Dr. Margaretmary Dys, 6600 cGy in 33 fractions from January 27, 2011 through March 15, 2011, in combination with weekly carboplatin and Taxotere. She was started on consolidative treatment with reduced doses of carboplatin, gemcitabine, and Neulasta on May 12, 2011. She has had significant thrombocytopenia not requiring transfusions. Patient completed 4 cycles of consolidative chemotherapy, completed on 07/14/11. Neulasta was omitted from cycle 4.  2. Atrial fibrillation  3. Peripheral neuropathy of feet and hands since 1998.  4. Hypertension  5. Incontinence of urine 6. History of shock, diarrhea, metabolic acidosis, and renal failure, requiring admission to the ICU in April 2012.  7. History of left foot drop 8. Anemia 9. Renal insufficiency. 10. Right port-a-cath placed 01/20/11 11. Admission to the hospital from 04/18/2012 through 04/25/2012 with diarrhea, dehydration, worsening of renal insufficiency secondary to Clostridium difficile colitis. The patient is about to finish up her oral vancomycin. There was no history of prior antibiotic exposure.  12. Anemia with unexplained drop in the patient's hemoglobin from 8.2 on 04/25/2012 to 5.6 on 05/01/2012. The patient received 2 units of packed red cells. Stool cards on 05/08/2012 were negative for occult blood. In part, the anemia may be secondary to the patient's  underlying renal insufficiency.  Dr. Arline Asp and I saw Madison [name] today for followup of her stage IIIA squamous cell carcinoma involving the right upper lobe with diagnosis established in early April 2012. Madison [name] is here today with her husband [name]. She was last seen by [place] on 05/08/2012. The patient required admission to the hospital from 04/18/2012 through 04/25/2012 with problems related to C difficile colitis. She also had acute on chronic renal insufficiency.  Madison [name] has made a nice recovery. When patient came into the office on 05/01/2012 for blood work, it was noted to have a hemoglobin of 5.6, whereas the hemoglobin had been 8.2 at the time of discharge on 04/25/2012. In addition, BUN was 28.0, creatinine 3.2, albumin 2.8. BUN and creatinine actually were improving. The patient required 2 units of packed red cells on 05/01/2012. Stool cards were negative for occult blood on 05/08/2012.  Madison [name] is without any major complaints today. She is troubled primarily by urinary incontinence and her neuropathy. These are ongoing problems.  Madison [name] denies any other symptomatology.  The patient did undergo a CT scan of the chest without IV contrast on 04/19/2012 as followup for her lung cancer. There was no evidence for disease. The CT scan was compared with the CT scan of 10/26/2011. The patient also has had bilateral screening mammograms on 05/04/2012. There were some calcifications of the left breast that require further evaluation and we will be scheduling the patient for a left breast biopsy today. The patient's BUN/Cr are quite elevated today (57/3.2) in addition to her blood pressure (166/91 and 170/95).  These findings were explained to the patient and her husband.  We will also ensure that Dr.  [name] and Dr. Clent Ridges are informed of our findings also.   Problem List: Past Medical History  Diagnosis Date  . Hypertension   . Anemia   . Throat cancer     lung ca  . Lung cancer  11/11/10  . Atrial fibrillation   . Chronic kidney disease    Surgical History: Past Surgical History  Procedure Date  . Port a cath placement     Current Medications: Current Outpatient Prescriptions  Medication Sig Dispense Refill  . cholecalciferol (VITAMIN D) 1000 UNITS tablet Take 1,000 Units by mouth daily.        Marland Kitchen diltiazem (CARDIZEM CD) 180 MG 24 hr capsule Take 180 mg by mouth daily.        . folic acid (FOLVITE) 1 MG tablet Take 1 mg by mouth daily.        . iron polysaccharides (NIFEREX) 150 MG capsule Take 150 mg by mouth daily.      . iron polysaccharides (POLY-IRON 150) 150 MG capsule       . lidocaine-prilocaine (EMLA) cream Apply 1 application topically as needed. For port-a-cath access.      . loratadine (CLARITIN) 10 MG tablet Take 10 mg by mouth daily.        . Multiple Vitamin (MULTIVITAMIN WITH MINERALS) TABS Take 1 tablet by mouth daily.      . potassium chloride SA (K-DUR,KLOR-CON) 20 MEQ tablet Take 1 tablet for 2 days then as directed.  10 tablet  0  . Probiotic Product (ALIGN) 4 MG CAPS Take 4 mg by mouth daily.      Marland Kitchen thiamine (VITAMIN B-1) 100 MG tablet Take 100 mg by mouth daily.        No current facility-administered medications for this visit.   Facility-Administered Medications Ordered in Other Visits  Medication Dose Route Frequency Provider Last Rate Last Dose  . [COMPLETED] heparin lock flush 100 unit/mL  500 Units Intravenous Once Samul Dada, MD   500 Units at 06/28/12 0955  . sodium chloride 0.9 % injection 10 mL  10 mL Intravenous PRN Samul Dada, MD   10 mL at 06/28/12 0955    Allergies: Allergies  Allergen Reactions  . Lisinopril     Makes face swell    Family History: The patient states there have not been any changes in her family history.  Social History: History  Substance Use Topics  . Smoking status: Former Smoker -- 1.0 packs/day for 40 years    Types: Cigarettes    Quit date: 11/09/2010  . Smokeless  tobacco: Never Used  . Alcohol Use: No    Review of Systems: A 10 point review of symptoms was completed and is negative except as noted above.  Physical Exam:   Blood pressure 166/91, pulse 110, temperature 96.9 F (36.1 C), temperature source Oral, resp. rate 20, height 5\' 6"  (1.676 m), weight 140 lb 12.8 oz (63.866 kg).  General appearance: Alert, cooperative, well nourished, well developed, no apparent distress  Head: Normocephalic, without obvious abnormality, atraumatic  Eyes: Conjunctivae/corneas clear, arcus senilis, PERRLA, EOMI, icteric sclera Nose: Nares, septum and mucosa are normal, no drainage or sinus tenderness  Neck: No adenopathy, supple, trachea midline, thyroid not enlarged, symmetric, no tenderness  Resp: Clear to auscultation bilaterally  Cardio: Regular rate and rhythm, S1, S2 normal, no murmur, click, rub or gallop, right chest Port-A-Cath without signs of infection  GI: Soft, non-tender, distended, normoactive bowel sounds, no organomegaly  Extremities: Extremities normal, atraumatic,  no cyanosis or edema Lymph nodes:  Cervical, supraclavicular and axillary nodes normal  Neurologic: Grossly normal   Laboratory Data: Results for orders placed in visit on 06/28/12 (from the past 24 hour(s))  CBC WITH DIFFERENTIAL     Status: Abnormal   Collection Time   06/28/12  9:27 AM      Component Value Range   WBC 6.2  3.9 - 10.3 10e3/uL   NEUT# 4.2  1.5 - 6.5 10e3/uL   HGB 9.7 (*) 11.6 - 15.9 g/dL   HCT 16.1 (*) 09.6 - 04.5 %   Platelets 184  145 - 400 10e3/uL   MCV 98.5  79.5 - 101.0 fL   MCH 34.3 (*) 25.1 - 34.0 pg   MCHC 34.8  31.5 - 36.0 g/dL   RBC 4.09 (*) 8.11 - 9.14 10e6/uL   RDW 15.6 (*) 11.2 - 14.5 %   lymph# 1.4  0.9 - 3.3 10e3/uL   MONO# 0.5  0.1 - 0.9 10e3/uL   Eosinophils Absolute 0.0  0.0 - 0.5 10e3/uL   Basophils Absolute 0.0  0.0 - 0.1 10e3/uL   NEUT% 68.1  38.4 - 76.8 %   LYMPH% 22.9  14.0 - 49.7 %   MONO% 7.8  0.0 - 14.0 %   EOS% 0.7  0.0  - 7.0 %   BASO% 0.5  0.0 - 2.0 %   Narrative:    Items were attached to this order:  Outside Physician OrdersPerformed At:  [facility]               [address]               [place], [place] [address]  COMPREHENSIVE METABOLIC PANEL (CC13)     Status: Abnormal   Collection Time   06/28/12  9:27 AM      Component Value Range   Sodium 139  136 - 145 mEq/L   Potassium 4.3  3.5 - 5.1 mEq/L   Chloride 110 (*) 98 - 107 mEq/L   CO2 17 (*) 22 - 29 mEq/L   Glucose 84  70 - 99 mg/dl   BUN 62.1 (*) 7.0 - 30.8 mg/dL   Creatinine 3.2 Repeated and Verified (*) 0.6 - 1.1 mg/dL   Total Bilirubin 6.57  0.20 - 1.20 mg/dL   Alkaline Phosphatase 108  40 - 150 U/L   AST 24  5 - 34 U/L   ALT 19  0 - 55 U/L   Total Protein 8.5 (*) 6.4 - 8.3 g/dL   Albumin 4.0  3.5 - 5.0 g/dL   Calcium 9.8  8.4 - 84.6 mg/dL   Narrative:    Has the patient fasted?->NoPerformed At:  [facility]               [address]               [place], [place] [address]  LACTATE DEHYDROGENASE (CC13)     Status: Normal   Collection Time   06/28/12  9:27 AM      Component Value Range   LDH 174  125 - 245 U/L   Narrative:    Performed At:  [facility]               [address]               [place], [place] [address]    Imaging Studies: 1. PET scan from 12/30/2010  showed abnormal uptake within the upper cervical spine and adjacent skull base which is incompletely visualized. There was low level abnormal right perihilar uptake compatible with the patient's diagnosis of lung cancer diagnosed at bronchoscopy. Tumor involved the right upper lobe. An addendum to the report stated that there were some alternative explanations for the activity in the cervical spine that has more to do with misregistration of data than the possibility of metastatic disease. There was also noted that there was a low-grade activity in the mediastinal adenopathy that was shown on the CT scan. The right lower paratracheal  lymph node measured 1.7 x 2.1 cm. There was a 2.4 x 2.1 cm precarinal lymph node, and a 3.3 x 2.1 cm subcarinal lymph node.  2. CT scan of the chest without IV contrast from 04/19/2011 showed improved appearance of the right chest compared with the PET scan of 12/30/2010 and the CT scan of the chest from 11/10/2010. Right upper lobe dense consolidation had resolved. Bilateral pleural effusions had resolved. There was a decrease in the adenopathy seen in the chest. There was an indeterminate right upper lobe nodule that warranted further followup.  3. PET scan from 10/26/2011 showed interval resolution of the hypermetabolic right hilar lymphadenopathy as well as near-complete resolution of the mediastinal lymphadenopathy which showed no significant hypermetabolic activity. There was no evidence for extra-thoracic metastatic disease. It was noted also that the activity seen in the region of the skull base and C1 was no longer visualized and was felt to be muscular artifact from the prior PET scan of 12/30/2010. In reviewing this PET scan, it was suggested that CT scans of the chest, ideally with IV contrast if patient's renal function will allow, may give [place] the best method for followup.  4. CT scan of the chest without IV contrast on 05/01/2012 showed stable borderline enlarged lower right paratracheal lymph node measuring 1.2 cm. There were some tiny nodules within the right upper lobe that are stable compared with previous exams. Comparison was made with the CT scan of 10/26/2011.  5. Digital bilateral screening mammogram on 05/04/2012 showed calcifications in the left breast. Further evaluation was suggested. 6. Digital diagnostic left breast mammogram on 05/14/2012 showed suspicious left breast calcifications. Tissue sampling is  recommended.    Impression/Plan: Ms. [name] is now out about a year and a half from the time of diagnosis of her squamous cell carcinoma of the right upper lobe, stage IIIA.   Unfortunately she remains at high risk for recurrence. The patient seems to be doing well following recent problems as described above.  Dr. Arline Asp is planning on repeating a chest CT in February or March 2014.  We will schedule the patient for a breast biopsy today.  Madison [name] stated she visited Dr. [name] at [facility] approximate 1 week ago.  We will notify Dr. [name] of the patient's elevated BUN/Cr (57/3.2).   Dr. Arline Asp emphasized again to Madison [name] to talk with Dr. Clent Ridges about Pneumovax and influenza vaccination.   The patient's Port-A-Cath was flushed today and we will continue to flush the Port-A-Cath every 2 months.   Will plan to check a CBC and BMP every 2 weeks until we see Madison [name] again on 08/23/2012.  At that time, we will check CBC and chemistries, including an LDH. The patient will be due for a Port-A- Cath flush at that time also.  The patient and her husband are encouraged to contact us in the interim with any questions or  concerns.   Titus Drone  NP-C 06/28/2012, 12:59 PM

## 2012-06-28 NOTE — Telephone Encounter (Signed)
appts made and ptrinted and mailed to pt pt aware of her bx appts at the brst center

## 2012-06-29 ENCOUNTER — Telehealth: Payer: Self-pay | Admitting: Family

## 2012-06-29 NOTE — Telephone Encounter (Signed)
Notified Jan Publishing rights manager) at [facility] [phone]) about Madison [name]'s elevated BUN/CR.  Jan says that she has access to EPIC (to see the lab results) and will give the results to Dr. [name].

## 2012-06-29 NOTE — Telephone Encounter (Signed)
Notified Annice Pih - CMA at Dr. Claris Che office [phone]) about Mrs. Cinque's elevated BUN/CR and elevated blood pressure readings.

## 2012-07-11 ENCOUNTER — Telehealth: Payer: Self-pay | Admitting: *Deleted

## 2012-07-11 NOTE — Telephone Encounter (Signed)
Patient called for clarification on appts for Friday 07/13/12, needs explanation of where and what is happening. Informed patient that lab appt is for [place], then she is scheduled Biopsy with breast center at 0930. Called and asked appts at [facility] to please call and explain procedure to patient. Informed Madison [name] that if she needs to change the lab appt, please call and r/s.

## 2012-07-12 ENCOUNTER — Other Ambulatory Visit: Payer: Medicare Other

## 2012-07-13 ENCOUNTER — Ambulatory Visit
Admission: RE | Admit: 2012-07-13 | Discharge: 2012-07-13 | Disposition: A | Discharge status: Home / Self Care | Payer: Medicare Other | Source: Ambulatory Visit | Attending: Oncology | Admitting: Oncology

## 2012-07-13 ENCOUNTER — Other Ambulatory Visit (HOSPITAL_BASED_OUTPATIENT_CLINIC_OR_DEPARTMENT_OTHER): Payer: Medicare Other | Admitting: Lab

## 2012-07-13 DIAGNOSIS — R921 Mammographic calcification found on diagnostic imaging of breast: Secondary | ICD-10-CM

## 2012-07-13 DIAGNOSIS — D649 Anemia, unspecified: Secondary | ICD-10-CM

## 2012-07-13 DIAGNOSIS — R928 Other abnormal and inconclusive findings on diagnostic imaging of breast: Secondary | ICD-10-CM

## 2012-07-13 DIAGNOSIS — C3490 Malignant neoplasm of unspecified part of unspecified bronchus or lung: Secondary | ICD-10-CM

## 2012-07-13 DIAGNOSIS — C3410 Malignant neoplasm of upper lobe, unspecified bronchus or lung: Secondary | ICD-10-CM

## 2012-07-13 LAB — CBC WITH DIFFERENTIAL/PLATELET
BASO%: 1 % (ref 0.0–2.0)
EOS%: 1.2 % (ref 0.0–7.0)
HCT: 26.8 % — ABNORMAL LOW (ref 34.8–46.6)
LYMPH%: 32.9 % (ref 14.0–49.7)
MCH: 34.5 pg — ABNORMAL HIGH (ref 25.1–34.0)
MCHC: 34.5 g/dL (ref 31.5–36.0)
MONO#: 0.5 10*3/uL (ref 0.1–0.9)
NEUT%: 48.3 % (ref 38.4–76.8)
Platelets: 160 10*3/uL (ref 145–400)

## 2012-07-13 LAB — BASIC METABOLIC PANEL (CC13)
CO2: 16 mEq/L — ABNORMAL LOW (ref 22–29)
Calcium: 9.2 mg/dL (ref 8.4–10.4)
Chloride: 109 mEq/L — ABNORMAL HIGH (ref 98–107)
Potassium: 4 mEq/L (ref 3.5–5.1)
Sodium: 142 mEq/L (ref 136–145)

## 2012-07-17 ENCOUNTER — Other Ambulatory Visit: Payer: Self-pay | Admitting: Oncology

## 2012-07-17 DIAGNOSIS — N289 Disorder of kidney and ureter, unspecified: Secondary | ICD-10-CM

## 2012-07-17 DIAGNOSIS — D649 Anemia, unspecified: Secondary | ICD-10-CM

## 2012-07-26 ENCOUNTER — Other Ambulatory Visit (HOSPITAL_BASED_OUTPATIENT_CLINIC_OR_DEPARTMENT_OTHER): Payer: Medicare Other | Admitting: Lab

## 2012-07-26 DIAGNOSIS — C3410 Malignant neoplasm of upper lobe, unspecified bronchus or lung: Secondary | ICD-10-CM

## 2012-07-26 DIAGNOSIS — C3490 Malignant neoplasm of unspecified part of unspecified bronchus or lung: Secondary | ICD-10-CM

## 2012-07-26 DIAGNOSIS — D649 Anemia, unspecified: Secondary | ICD-10-CM

## 2012-07-26 LAB — CBC WITH DIFFERENTIAL/PLATELET
Basophils Absolute: 0 10*3/uL (ref 0.0–0.1)
EOS%: 0.6 % (ref 0.0–7.0)
HCT: 29.3 % — ABNORMAL LOW (ref 34.8–46.6)
HGB: 10 g/dL — ABNORMAL LOW (ref 11.6–15.9)
LYMPH%: 32.4 % (ref 14.0–49.7)
MCH: 34.8 pg — ABNORMAL HIGH (ref 25.1–34.0)
NEUT%: 52.4 % (ref 38.4–76.8)
Platelets: 179 10*3/uL (ref 145–400)
lymph#: 1.3 10*3/uL (ref 0.9–3.3)

## 2012-07-26 LAB — BASIC METABOLIC PANEL (CC13)
BUN: 39 mg/dL — ABNORMAL HIGH (ref 7.0–26.0)
CO2: 19 mEq/L — ABNORMAL LOW (ref 22–29)
Calcium: 9.3 mg/dL (ref 8.4–10.4)
Chloride: 111 mEq/L — ABNORMAL HIGH (ref 98–107)
Creatinine: 2.4 mg/dL — ABNORMAL HIGH (ref 0.6–1.1)

## 2012-07-27 NOTE — Progress Notes (Signed)
Quick Note:  Please notify patient and call/fax these results to patient's doctors. ______ 

## 2012-08-09 ENCOUNTER — Other Ambulatory Visit: Payer: Medicare Other

## 2012-08-09 ENCOUNTER — Telehealth: Payer: Self-pay | Admitting: Oncology

## 2012-08-09 NOTE — Telephone Encounter (Signed)
pt needed to cancel 1.2.14 lab due to being out of town and did not know when she was comming back.Marland KitchenMarland KitchenDone

## 2012-08-16 ENCOUNTER — Telehealth: Payer: Self-pay | Admitting: Oncology

## 2012-08-16 NOTE — Telephone Encounter (Signed)
1/20 appt moved to 1/27 poof. Not able to reach pt by phone or lm. Letter mailed.

## 2012-08-27 ENCOUNTER — Other Ambulatory Visit: Payer: Medicare Other | Admitting: Lab

## 2012-08-27 ENCOUNTER — Ambulatory Visit: Payer: Medicare Other | Admitting: Oncology

## 2012-09-03 ENCOUNTER — Other Ambulatory Visit: Payer: Medicare Other | Admitting: Lab

## 2012-09-03 ENCOUNTER — Ambulatory Visit: Payer: Medicare Other | Admitting: Oncology

## 2012-09-06 ENCOUNTER — Telehealth: Payer: Self-pay | Admitting: Oncology

## 2012-09-06 NOTE — Telephone Encounter (Signed)
Returned pt's call re r/s appt. Not able to reach pt or lm. Appt r/s tp 3/25 and schedule mailed.

## 2012-09-10 ENCOUNTER — Other Ambulatory Visit: Payer: Self-pay | Admitting: Medical Oncology

## 2012-09-10 ENCOUNTER — Telehealth: Payer: Self-pay | Admitting: Medical Oncology

## 2012-09-10 ENCOUNTER — Telehealth: Payer: Self-pay | Admitting: Oncology

## 2012-09-10 NOTE — Telephone Encounter (Signed)
Pt called and left a message earlier today about her port flush. I noticed that pt had cancelled,rescheduled and missed her appointments. She called in a scheduled an appointment at the end of March.Per Dr.Murinson he feels pt needs to be seen sooner. I called pt and told her that we can see her 09/27/12 for labs and MD 3:30/400 pm. She states really want early morning appointments. I tried to explain that Dr. Arline Asp is booked and this is the earliest we can see her.

## 2012-09-10 NOTE — Telephone Encounter (Signed)
S/w pt today re flush appt for 2/6.

## 2012-09-10 NOTE — Telephone Encounter (Signed)
S/w pt re new appt for 2/20 @ 3pm (lb/flush/DM). Pt aware 2/6 flush appt cx'd. Flush scheduled after lb on 2/20 due to flush nurse will be gone when pt's finishes visit w/DM.

## 2012-09-22 ENCOUNTER — Other Ambulatory Visit: Payer: Self-pay

## 2012-09-27 ENCOUNTER — Ambulatory Visit: Payer: Medicare Other

## 2012-09-27 ENCOUNTER — Other Ambulatory Visit: Payer: Self-pay | Admitting: Family

## 2012-09-27 ENCOUNTER — Encounter: Payer: Self-pay | Admitting: Oncology

## 2012-09-27 ENCOUNTER — Ambulatory Visit (HOSPITAL_BASED_OUTPATIENT_CLINIC_OR_DEPARTMENT_OTHER): Payer: Medicare Other | Admitting: Oncology

## 2012-09-27 ENCOUNTER — Other Ambulatory Visit: Payer: Medicare Other | Admitting: Lab

## 2012-09-27 VITALS — BP 153/93 | HR 57 | Temp 97.6°F | Resp 18 | Ht 66.0 in | Wt 147.3 lb

## 2012-09-27 LAB — CBC WITH DIFFERENTIAL/PLATELET
BASO%: 0.4 % (ref 0.0–2.0)
Basophils Absolute: 0 10*3/uL (ref 0.0–0.1)
EOS%: 0.4 % (ref 0.0–7.0)
Eosinophils Absolute: 0 10*3/uL (ref 0.0–0.5)
HCT: 30.5 % — ABNORMAL LOW (ref 34.8–46.6)
HGB: 10.2 g/dL — ABNORMAL LOW (ref 11.6–15.9)
LYMPH%: 25.3 % (ref 14.0–49.7)
MCH: 33.9 pg (ref 25.1–34.0)
MCHC: 33.4 g/dL (ref 31.5–36.0)
MCV: 101.3 fL — ABNORMAL HIGH (ref 79.5–101.0)
MONO#: 0.8 10*3/uL (ref 0.1–0.9)
MONO%: 14.8 % — ABNORMAL HIGH (ref 0.0–14.0)
NEUT#: 3 10*3/uL (ref 1.5–6.5)
NEUT%: 59.1 % (ref 38.4–76.8)
Platelets: 177 10*3/uL (ref 145–400)
RBC: 3.01 10*6/uL — ABNORMAL LOW (ref 3.70–5.45)
RDW: 14.3 % (ref 11.2–14.5)
WBC: 5.1 10*3/uL (ref 3.9–10.3)
lymph#: 1.3 10*3/uL (ref 0.9–3.3)
nRBC: 0 % (ref 0–0)

## 2012-09-27 LAB — COMPREHENSIVE METABOLIC PANEL (CC13)
ALT: 103 U/L — ABNORMAL HIGH (ref 0–55)
CO2: 18 mEq/L — ABNORMAL LOW (ref 22–29)
Calcium: 9.6 mg/dL (ref 8.4–10.4)
Chloride: 101 mEq/L (ref 98–107)
Creatinine: 2.6 mg/dL — ABNORMAL HIGH (ref 0.6–1.1)
Glucose: 102 mg/dl — ABNORMAL HIGH (ref 70–99)
Total Bilirubin: 0.68 mg/dL (ref 0.20–1.20)
Total Protein: 8.4 g/dL — ABNORMAL HIGH (ref 6.4–8.3)

## 2012-09-27 MED ORDER — HEPARIN SOD (PORK) LOCK FLUSH 100 UNIT/ML IV SOLN
500.0000 [IU] | Freq: Once | INTRAVENOUS | Status: AC
Start: 2012-09-27 — End: 2012-09-27
  Administered 2012-09-27: 500 [IU] via INTRAVENOUS
  Filled 2012-09-27: qty 5

## 2012-09-27 MED ORDER — SODIUM CHLORIDE 0.9 % IJ SOLN
10.0000 mL | INTRAMUSCULAR | Status: DC | PRN
  Administered 2012-09-27: 10 mL via INTRAVENOUS
  Filled 2012-09-27: qty 10

## 2012-09-27 NOTE — Progress Notes (Signed)
This office note has been dictated.  #[id number]

## 2012-09-28 NOTE — Progress Notes (Signed)
CC:   Georgann Housekeeper, MD [name], M.D. [name], MD [name], MD  PROBLEM LIST.:  1. 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 The patient  had a right pleural effusion at that time that was cytologically  negative. She received radiation treatments under the direction of  Dr. Margaretmary Dys, 6600 cGy in 33 fractions from January 27, 2011  through March 15, 2011, in combination with weekly carboplatin and  Taxotere. She was started on consolidative treatment with reduced  doses of carboplatin, gemcitabine, and Neulasta on May 12, 2011.  She has had significant thrombocytopenia not requiring transfusions.  Patient completed 4 cycles of consolidative chemotherapy, completed on 07/14/11.  Neulasta was omitted from cycle 4.  2. Atrial fibrillation.  3. Peripheral neuropathy of feet and hands since 1998.  4. Hypertension.  5. Incontinence of urine.  6. History of shock, diarrhea, metabolic acidosis, and renal failure,  requiring admission to the ICU in April 2012.  7. History of left foot drop.  8. Anemia.  9. Renal insufficiency.  10. Right port-a-cath placed 01/20/11.  11. Admission to the hospital from 04/18/2012 through 04/25/2012 with  diarrhea, dehydration, worsening of renal insufficiency secondary  to Clostridium difficile colitis. The patient is about to finish  up her oral vancomycin. There was no history of prior antibiotic  exposure.  12. Anemia with unexplained drop in the patient's hemoglobin from 8.2  on 04/25/2012 to 5.6 on 05/01/2012. The patient received 2 units  of packed red cells. Stool cards on 05/08/2012 were negative for  occult blood. In part, the anemia may be secondary to the  patient's underlying renal insufficiency. 13. Bilateral screening mammogram from 05/04/2012 showed calcifications involving the left breast.  A core needle biopsy carried out on the  left breast on 07/13/2012 showed a fibroadenoma with associated calcifications.  No atypia or malignancy was identified.  MEDICATIONS:  Reviewed and recorded. Current Outpatient Prescriptions  Medication Sig Dispense Refill  . cholecalciferol (VITAMIN D) 1000 UNITS tablet Take 1,000 Units by mouth daily.        Marland Kitchen diltiazem (CARDIZEM CD) 180 MG 24 hr capsule Take 180 mg by mouth daily.        . folic acid (FOLVITE) 1 MG tablet Take 1 mg by mouth daily.        . iron polysaccharides (NIFEREX) 150 MG capsule Take 150 mg by mouth daily.      . Probiotic Product (ALIGN) 4 MG CAPS Take 4 mg by mouth daily.      Marland Kitchen thiamine (VITAMIN B-1) 100 MG tablet Take 100 mg by mouth daily.       Marland Kitchen lidocaine-prilocaine (EMLA) cream Apply 1 application topically as needed. For port-a-cath access.      . loratadine (CLARITIN) 10 MG tablet Take 10 mg by mouth daily.        . Multiple Vitamin (MULTIVITAMIN WITH MINERALS) TABS Take 1 tablet by mouth daily.       No current facility-administered medications for this visit.    IMMUNIZATIONS:  We have no record pertaining to Pneumovax or flu shots.  It was suggested that she consult with her primary care physician.  SMOKING HISTORY:  The patient has smoked a pack cigarettes a day for 30 years.  She stopped smoking in April 2012 at the time of her lung cancer diagnosis.  HISTORY:  I saw Madison [name] today for followup of her stage  IIIA squamous cell carcinoma involving the right upper lobe with diagnosis established in early April 2012.  Madison [name] was last seen by [place] on 06/28/2012 and prior to that on 05/08/2012.  She is without complaints today.  Her overall condition remains excellent.  The patient did have a core needle biopsy of her left breast on 07/13/2012.  This showed a fibroadenoma with calcifications.  The patient denies any cough, shortness of breath, pain, fever, any sense of ill health.  She is here today with her husband [name] and her son  [name].  PHYSICAL EXAMINATION:  General:  She looks well.  She gets around with a walker and at times a cane.  Weight is 147 pounds 4.8 ounces as compared with 140 pounds 12.8 ounces back on 06/28/2012.  Height 5 feet 6 inches, body surface area 1.76 sq m.  Vital Signs:  Blood pressure 153/93. Other vital signs are normal.  HEENT:  There is no scleral icterus. Mouth and pharynx are benign.  No peripheral adenopathy palpable.  Heart and lungs:  Normal.  Rhythm is regular.  Breasts:  Not examined.  There is a right-sided Port-A-Cath that had been placed on 01/20/2011.  This was flushed with heparin today and on her last visit on 06/28/2012. Abdomen:  With the patient sitting is benign.  Extremities:  Puffy ankles, left greater than right, without pitting.  Neurologic:  Grossly normal, except for a left foot drop and some generalized weakness in the hip flexors.  LABORATORY DATA:  Today, white count 5.1, ANC 3.0, hemoglobin 10.2, hematocrit 30.5, platelets 177,000.  Chemistries today notable for elevated liver function tests, specifically an AST of 143, an ALT of 103, and an LDH of 395.  Albumin was 4.2, bilirubin 0.68.  BUN 42, creatinine 2.6.   IMAGING STUDIES:  1. PET scan from 12/30/2010 showed abnormal uptake within the upper  cervical spine and adjacent skull base which is incompletely  visualized. There was low level abnormal right perihilar uptake  compatible with the patient's diagnosis of lung cancer diagnosed at  bronchoscopy. Tumor involved the right upper lobe. An addendum to  the report stated that there were some alternative explanations for  the activity in the cervical spine that has more to do with  misregistration of data than the possibility of metastatic disease.  There was also noted that there was a low-grade activity in the  mediastinal adenopathy that was shown on the CT scan. The right  lower paratracheal lymph node measured 1.7 x 2.1 cm. There was a  2.4 x  2.1 cm precarinal lymph node, and a 3.3 x 2.1 cm subcarinal  lymph node.  2. CT scan of the chest without IV contrast from 04/19/2011 showed  improved appearance of the right chest compared with the PET scan  of 12/30/2010 and the CT scan of the chest from 11/10/2010. Right  upper lobe dense consolidation had resolved. Bilateral pleural  effusions had resolved. There was a decrease in the adenopathy  seen in the chest. There was an indeterminate right upper lobe  nodule that warranted further followup.  3. PET scan from 10/26/2011 showed interval resolution of the  hypermetabolic right hilar lymphadenopathy as well as near-complete  resolution of the mediastinal lymphadenopathy which showed no  significant hypermetabolic activity. There was no evidence for  extra-thoracic metastatic disease. It was noted also that the  activity seen in the region of the skull base and C1 was no longer  visualized and was felt to be  muscular artifact from the prior PET  scan of 12/30/2010. In reviewing this PET scan, it was suggested  that CT scans of the chest, ideally with IV contrast if patient's  renal function will allow, may give [place] the best method for followup.  4. CT scan of the chest without IV contrast on 05/01/2012 showed  stable borderline enlarged lower right paratracheal lymph node  measuring 1.2 cm. There were some tiny nodules within the right  upper lobe that are stable compared with previous exams.  Comparison was made with the CT scan of 10/26/2011.  5. Digital bilateral screening mammogram on 05/04/2012 showed  calcifications in the left breast. Further evaluation was  suggested. 6. Digital diagnostic left mammogram on 05/14/2012 showed suspicious left breast calcifications.  Stereotactic biopsy of the left breast was recommended. 7. Stereotactic-guided vacuum-assisted biopsy of the left breast and specimen radiograph was carried out on 07/13/2012.  Pathology revealed a fibroadenoma  with calcifications.    IMPRESSION AND PLAN:  Mrs. Carboni is now approaching 2 years from the time of diagnosis.  Today I am most concerned about her elevated liver function tests, particularly her LDH.  The patient has renal insufficiency and thus, we will not be able to use IV contrast.  We will go ahead and obtain a CT scan of the chest without IV contrast.  This should show approximately 80% of the liver.  If the etiology of the patient's elevated liver function tests is unclear, we may want to get an MRI of the liver without IV contrast.  Certainly, I am concerned about the possibility of tumor recurrence within the liver, even though the patient seems to be doing well from a clinical standpoint, especially symptomatically.  We will have the patient return in 2 months, at which time we will check CBC, chemistries, and CEA.  The patient will also be due for a Port-A- Cath flush with heparin.  We are continuing to maintain the Port-A-Cath with heparin flushes every 2 months.    ______________________________ Samul Dada, M.D. DSM/MEDQ  D:  09/27/2012  T:  09/28/2012  Job:  [id number]

## 2012-10-01 ENCOUNTER — Telehealth: Payer: Self-pay | Admitting: Oncology

## 2012-10-02 ENCOUNTER — Ambulatory Visit (HOSPITAL_COMMUNITY)
Admission: RE | Admit: 2012-10-02 | Discharge: 2012-10-02 | Disposition: A | Discharge status: Home / Self Care | Payer: Medicare Other | Source: Ambulatory Visit | Attending: Oncology | Admitting: Oncology

## 2012-10-02 ENCOUNTER — Encounter (HOSPITAL_COMMUNITY): Payer: Self-pay

## 2012-10-02 DIAGNOSIS — Z923 Personal history of irradiation: Secondary | ICD-10-CM | POA: Insufficient documentation

## 2012-10-02 DIAGNOSIS — Z9221 Personal history of antineoplastic chemotherapy: Secondary | ICD-10-CM | POA: Insufficient documentation

## 2012-10-02 DIAGNOSIS — R059 Cough, unspecified: Secondary | ICD-10-CM | POA: Insufficient documentation

## 2012-10-02 DIAGNOSIS — C3490 Malignant neoplasm of unspecified part of unspecified bronchus or lung: Secondary | ICD-10-CM | POA: Insufficient documentation

## 2012-10-02 DIAGNOSIS — M5144 Schmorl's nodes, thoracic region: Secondary | ICD-10-CM | POA: Insufficient documentation

## 2012-10-02 DIAGNOSIS — M439 Deforming dorsopathy, unspecified: Secondary | ICD-10-CM | POA: Insufficient documentation

## 2012-10-02 DIAGNOSIS — R911 Solitary pulmonary nodule: Secondary | ICD-10-CM | POA: Insufficient documentation

## 2012-10-02 DIAGNOSIS — K7689 Other specified diseases of liver: Secondary | ICD-10-CM | POA: Insufficient documentation

## 2012-10-02 NOTE — Progress Notes (Signed)
Quick Note:  Please notify patient and call/fax these results to patient's doctors. ______ 

## 2012-10-08 ENCOUNTER — Encounter: Payer: Self-pay | Admitting: Oncology

## 2012-10-08 NOTE — Progress Notes (Signed)
CT scan of the chest without IV contrast from 10/02/2012 was reviewed with the radiologist. There is a new mild superior end plate compression deformity at T12 as compared with the chest CT scan from 05/01/2012. There is no evidence for recurrent lung cancer.

## 2012-10-09 ENCOUNTER — Telehealth: Payer: Self-pay | Admitting: Medical Oncology

## 2012-10-09 NOTE — Telephone Encounter (Signed)
I called pt with her CT results but there was not answer.

## 2012-10-30 ENCOUNTER — Ambulatory Visit: Payer: Medicare Other | Admitting: Oncology

## 2012-10-30 ENCOUNTER — Other Ambulatory Visit: Payer: Medicare Other | Admitting: Lab

## 2012-11-26 ENCOUNTER — Other Ambulatory Visit: Payer: Self-pay | Admitting: Lab

## 2012-11-26 ENCOUNTER — Ambulatory Visit: Payer: Medicare Other

## 2012-11-26 ENCOUNTER — Encounter: Payer: Self-pay | Admitting: Lab

## 2012-11-26 ENCOUNTER — Encounter: Payer: Self-pay | Admitting: Oncology

## 2012-11-26 ENCOUNTER — Telehealth: Payer: Self-pay | Admitting: Oncology

## 2012-11-26 ENCOUNTER — Other Ambulatory Visit (HOSPITAL_BASED_OUTPATIENT_CLINIC_OR_DEPARTMENT_OTHER): Payer: Medicare Other | Admitting: Lab

## 2012-11-26 ENCOUNTER — Ambulatory Visit: Payer: Self-pay | Admitting: Oncology

## 2012-11-26 ENCOUNTER — Ambulatory Visit (HOSPITAL_BASED_OUTPATIENT_CLINIC_OR_DEPARTMENT_OTHER): Payer: Medicare Other | Admitting: Oncology

## 2012-11-26 DIAGNOSIS — R7989 Other specified abnormal findings of blood chemistry: Secondary | ICD-10-CM

## 2012-11-26 DIAGNOSIS — C3490 Malignant neoplasm of unspecified part of unspecified bronchus or lung: Secondary | ICD-10-CM

## 2012-11-26 DIAGNOSIS — C3410 Malignant neoplasm of upper lobe, unspecified bronchus or lung: Secondary | ICD-10-CM

## 2012-11-26 LAB — COMPREHENSIVE METABOLIC PANEL (CC13)
BUN: 28.2 mg/dL — ABNORMAL HIGH (ref 7.0–26.0)
CO2: 15 mEq/L — ABNORMAL LOW (ref 22–29)
Creatinine: 2.2 mg/dL — ABNORMAL HIGH (ref 0.6–1.1)
Glucose: 72 mg/dl (ref 70–99)
Total Bilirubin: 0.47 mg/dL (ref 0.20–1.20)

## 2012-11-26 LAB — CBC WITH DIFFERENTIAL/PLATELET
Basophils Absolute: 0 10*3/uL (ref 0.0–0.1)
EOS%: 1.9 % (ref 0.0–7.0)
HCT: 30.4 % — ABNORMAL LOW (ref 34.8–46.6)
HGB: 10.3 g/dL — ABNORMAL LOW (ref 11.6–15.9)
LYMPH%: 31.7 % (ref 14.0–49.7)
MCH: 36 pg — ABNORMAL HIGH (ref 25.1–34.0)
MCV: 105.5 fL — ABNORMAL HIGH (ref 79.5–101.0)
MONO%: 17.8 % — ABNORMAL HIGH (ref 0.0–14.0)
NEUT%: 48.3 % (ref 38.4–76.8)
RDW: 14.8 % — ABNORMAL HIGH (ref 11.2–14.5)

## 2012-11-26 MED ORDER — HEPARIN SOD (PORK) LOCK FLUSH 100 UNIT/ML IV SOLN
500.0000 [IU] | Freq: Once | INTRAVENOUS | Status: AC
  Administered 2012-11-26: 500 [IU] via INTRAVENOUS
  Filled 2012-11-26: qty 5

## 2012-11-26 MED ORDER — SODIUM CHLORIDE 0.9 % IJ SOLN
10.0000 mL | INTRAMUSCULAR | Status: DC | PRN
  Administered 2012-11-26: 10 mL via INTRAVENOUS
  Filled 2012-11-26: qty 10

## 2012-11-26 NOTE — Telephone Encounter (Signed)
gv and printed appt sched and avs for June °

## 2012-11-26 NOTE — Progress Notes (Signed)
CC:   Madison Housekeeper, MD [name], M.D. Madison Peer, MD Madison Showers, MD Madison [name] Madison [name], M.D.   PROBLEM LIST.:  1. 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 The patient  had a right pleural effusion at that time that was cytologically  negative. She received radiation treatments under the direction of  Dr. Margaretmary [name], 6600 cGy in 33 fractions from January 27, 2011  through March 15, 2011, in combination with weekly carboplatin and  Taxotere. She was started on consolidative treatment with reduced  doses of carboplatin, gemcitabine, and Neulasta on May 12, 2011.  She has had significant thrombocytopenia not requiring transfusions.  Patient completed 4 cycles of consolidative chemotherapy, completed on 07/14/11.  Neulasta was omitted from cycle 4. CT scan of the chest without IV contrast carried out on 10/02/2012 showed no evidence of recurrent lung cancer.  2. Atrial fibrillation.  3. Peripheral neuropathy of feet and hands since 1998.  4. Hypertension.  5. Incontinence of urine.  6. History of shock, diarrhea, metabolic acidosis, and renal failure,  requiring admission to the ICU in April 2012.  7. History of left foot drop.  8. Anemia.  9. Renal insufficiency.  10. Right port-a-cath placed 01/20/11.  11. Admission to the hospital from 04/18/2012 through 04/25/2012 with  diarrhea, dehydration, worsening of renal insufficiency secondary  to Clostridium difficile colitis. The patient is about to finish  up her oral vancomycin. There was no history of prior antibiotic  exposure.  12. Anemia with unexplained drop in the patient's hemoglobin from 8.2  on 04/25/2012 to 5.6 on 05/01/2012. The patient received 2 units  of packed red cells. Stool cards on 05/08/2012 were negative for  occult blood. In part, the anemia may be secondary to the  patient's underlying renal insufficiency.   13. Bilateral screening mammogram from 05/04/2012 showed calcifications  involving the left breast. A core needle biopsy carried out on the left  breast on 07/13/2012 showed a fibroadenoma with associated calcifications. No atypia or malignancy was identified.   MEDICATIONS:  Reviewed and recorded. Current Outpatient Prescriptions  Medication Sig Dispense Refill  . cholecalciferol (VITAMIN D) 1000 UNITS tablet Take 1,000 Units by mouth daily.        . folic acid (FOLVITE) 1 MG tablet Take 1 mg by mouth daily.        . iron polysaccharides (NIFEREX) 150 MG capsule Take 150 mg by mouth daily.      Marland Kitchen lidocaine-prilocaine (EMLA) cream Apply 1 application topically as needed. For port-a-cath access.      . loratadine (CLARITIN) 10 MG tablet Take 10 mg by mouth daily.        . Multiple Vitamin (MULTIVITAMIN WITH MINERALS) TABS Take 1 tablet by mouth daily.      . Probiotic Product (ALIGN) 4 MG CAPS Take 4 mg by mouth daily.      Marland Kitchen thiamine (VITAMIN B-1) 100 MG tablet Take 100 mg by mouth daily.       Marland Kitchen diltiazem (CARDIZEM CD) 180 MG 24 hr capsule Take 180 mg by mouth daily.         No current facility-administered medications for this visit.   Facility-Administered Medications Ordered in Other Visits  Medication Dose Route Frequency Provider Last Rate Last Dose  . sodium chloride 0.9 % injection 10 mL  10 mL Intravenous PRN Madison Dada, MD   10 mL at 11/26/12 1045  IMMUNIZATIONS: We have no records pertaining to Pneumovax or flu shots.  It was suggested that she consult with her primary care physician.    SMOKING HISTORY: The patient has smoked a pack cigarettes a day for 30  years. She stopped smoking in April 2012 at the time of her lung cancer  diagnosis.    HISTORY:  Madison [name] was seen today for followup of her stage IIIA squamous cell carcinoma involving the right upper lobe with diagnosis established in early April 2012.  Madison [name] was last seen by [place] on 09/27/2012.   She is here today with her husband Madison [name].  She underwent a CT scan of the chest without IV contrast on 10/02/2012. There was no evidence of recurrent lung cancer.  There was the interval development of hepatic steatosis and a mild superior endplate compression deformity at T12.  The patient is here today in a wheelchair.  She gets around with a walker or cane.  She still has incontinence of urine and a peripheral neuropathy.  She is really without any new complaints, specifically denies any pain, respiratory difficulties.  Appetite and energy are good.  She is eating okay.  As stated, she is without any complaints today.  She denies any changes in her condition.  PHYSICAL EXAMINATION:  General:  There is little change.  Weight is 147 pounds 12.8 ounces, height 5 feet 6 inches, body surface area 1.77 sq m. Vital Signs:  Blood pressure 153/73.  Other vital signs are normal, except for a pulse that is regular at 120.  The patient is asymptomatic. O2 saturation on room air at rest was 100%.  HEENT:  There is no scleral icterus.  Mouth and pharynx are benign.  No peripheral adenopathy palpable.  Heart and lungs:  Normal.  Rhythm is regular.  Breasts:  Not examined.  There is a right-sided Port-A-Cath which was flushed with heparin today.  Abdomen:  With the patient sitting is benign. Extremities:  Puffy left ankle.  Neurologic: Grossly normal, except for a left foot drop and some generalized weakness in the hip flexors.  The patient needs to hold on when she walks.  She is wearing Depends.  LABORATORY DATA:  White count 3.6, ANC 1.7, hemoglobin 10.3, hematocrit 30.4, platelets 138,000.  There has been some fluctuation in the white count and platelet count in the past.  Chemistries notable for BUN 28, creatinine 2.2.  Albumin 3.7.  LDH 236, down from 395 on 09/27/2012. AST was 131 and ALT 68.  Those are also slightly improved.  CEA today is pending.  The most recent CEA was 0.6 on  05/01/2012.  IMAGING STUDIES:  1. PET scan from 12/30/2010 showed abnormal uptake within the upper  cervical spine and adjacent skull base which is incompletely  visualized. There was low level abnormal right perihilar uptake  compatible with the patient's diagnosis of lung cancer diagnosed at  bronchoscopy. Tumor involved the right upper lobe. An addendum to  the report stated that there were some alternative explanations for  the activity in the cervical spine that has more to do with  misregistration of data than the possibility of metastatic disease.  There was also noted that there was a low-grade activity in the  mediastinal adenopathy that was shown on the CT scan. The right  lower paratracheal lymph node measured 1.7 x 2.1 cm. There was a  2.4 x 2.1 cm precarinal lymph node, and a 3.3 x 2.1 cm subcarinal  lymph node.  2.  CT scan of the chest without IV contrast from 04/19/2011 showed  improved appearance of the right chest compared with the PET scan  of 12/30/2010 and the CT scan of the chest from 11/10/2010. Right  upper lobe dense consolidation had resolved. Bilateral pleural  effusions had resolved. There was a decrease in the adenopathy  seen in the chest. There was an indeterminate right upper lobe  nodule that warranted further followup.  3. PET scan from 10/26/2011 showed interval resolution of the  hypermetabolic right hilar lymphadenopathy as well as near-complete  resolution of the mediastinal lymphadenopathy which showed no  significant hypermetabolic activity. There was no evidence for  extra-thoracic metastatic disease. It was noted also that the  activity seen in the region of the skull base and C1 was no longer  visualized and was felt to be muscular artifact from the prior PET  scan of 12/30/2010. In reviewing this PET scan, it was suggested  that CT scans of the chest, ideally with IV contrast if patient's  renal function will allow, may give [place] the best  method for followup.  4. CT scan of the chest without IV contrast on 05/01/2012 showed  stable borderline enlarged lower right paratracheal lymph node  measuring 1.2 cm. There were some tiny nodules within the right  upper lobe that are stable compared with previous exams.  Comparison was made with the CT scan of 10/26/2011.  5. Digital bilateral screening mammogram on 05/04/2012 showed  calcifications in the left breast. Further evaluation was  suggested.  6. Digital diagnostic left mammogram on 05/14/2012 showed  suspicious left breast calcifications. Stereotactic biopsy of the left  breast was recommended.  7. Stereotactic-guided vacuum-assisted biopsy of the left breast and  specimen radiograph was carried out on 07/13/2012. Pathology revealed a  fibroadenoma with calcifications.  8. CT scan of the chest without IV contrast on 10/02/2012 showed stable appearance of the chest with right hilar distortion and adjacent paramediastinal radiation changes.  There was a stable right upper lobe pulmonary nodule and centrally lucent lesion with probable surrounding scar.  There was no evidence of local recurrence or metastatic disease. There was interval development of steatosis and a mild superior endplate compression deformity at T12 when compared with the prior chest CT scan from 05/01/2012.   IMPRESSION AND PLAN:  Madison [name] is now out 2 years from the time of diagnosis.  Clinically, she seems to be doing well.  I was worried about her elevated LDH on her last visit here 2 months ago.  LDH today has come down to 236.  Today the patient's white count and platelet count are slightly low for reasons that are unclear.  The patient has no new complaints, seems to be disease-free both clinically and by CT scan of the chest carried out on 10/02/2012.  We will continue to maintain the patient's Port-A-Cath with heparin flushes every 2 months.  Port-A-Cath was flushed with heparin  today.  We will plan to see Madison [name] again in 2 months, which should be around June 20th, at which time we will check CBC and chemistries.  She will be due for Port-A-Cath flush at that time.    ______________________________ Madison [name], M.D. DSM/MEDQ  D:  11/26/2012  T:  11/26/2012  Job:  [id number]

## 2012-11-26 NOTE — Progress Notes (Signed)
This office note has been dictated.  #[id number]

## 2012-12-26 ENCOUNTER — Telehealth: Payer: Self-pay | Admitting: Oncology

## 2012-12-26 NOTE — Telephone Encounter (Signed)
s.w. pt and r/s appt per Olegario Messier

## 2013-01-25 ENCOUNTER — Other Ambulatory Visit: Payer: Medicare Other | Admitting: Lab

## 2013-01-25 ENCOUNTER — Ambulatory Visit: Payer: Medicare Other | Admitting: Oncology

## 2013-01-28 ENCOUNTER — Other Ambulatory Visit (HOSPITAL_COMMUNITY): Payer: Self-pay | Admitting: Oncology

## 2013-01-28 DIAGNOSIS — D649 Anemia, unspecified: Secondary | ICD-10-CM

## 2013-02-05 ENCOUNTER — Telehealth: Payer: Self-pay

## 2013-02-05 ENCOUNTER — Encounter: Payer: Self-pay | Admitting: Oncology

## 2013-02-05 ENCOUNTER — Ambulatory Visit: Payer: Medicare Other

## 2013-02-05 ENCOUNTER — Ambulatory Visit (HOSPITAL_BASED_OUTPATIENT_CLINIC_OR_DEPARTMENT_OTHER): Payer: Medicare Other | Admitting: Oncology

## 2013-02-05 ENCOUNTER — Other Ambulatory Visit (HOSPITAL_BASED_OUTPATIENT_CLINIC_OR_DEPARTMENT_OTHER): Payer: Medicare Other | Admitting: Lab

## 2013-02-05 DIAGNOSIS — C3490 Malignant neoplasm of unspecified part of unspecified bronchus or lung: Secondary | ICD-10-CM

## 2013-02-05 LAB — COMPREHENSIVE METABOLIC PANEL (CC13)
ALT: 51 U/L (ref 0–55)
Albumin: 3.6 g/dL (ref 3.5–5.0)
BUN: 26.9 mg/dL — ABNORMAL HIGH (ref 7.0–26.0)
CO2: 18 mEq/L — ABNORMAL LOW (ref 22–29)
Calcium: 9.3 mg/dL (ref 8.4–10.4)
Chloride: 106 mEq/L (ref 98–109)
Creatinine: 2.2 mg/dL — ABNORMAL HIGH (ref 0.6–1.1)
Potassium: 4.2 mEq/L (ref 3.5–5.1)

## 2013-02-05 LAB — CBC WITH DIFFERENTIAL/PLATELET
Eosinophils Absolute: 0 10*3/uL (ref 0.0–0.5)
HCT: 28.8 % — ABNORMAL LOW (ref 34.8–46.6)
LYMPH%: 18.8 % (ref 14.0–49.7)
MONO#: 0.8 10*3/uL (ref 0.1–0.9)
NEUT#: 3.2 10*3/uL (ref 1.5–6.5)
NEUT%: 64.5 % (ref 38.4–76.8)
Platelets: 162 10*3/uL (ref 145–400)
WBC: 4.9 10*3/uL (ref 3.9–10.3)
lymph#: 0.9 10*3/uL (ref 0.9–3.3)

## 2013-02-05 LAB — LACTATE DEHYDROGENASE (CC13): LDH: 250 U/L — ABNORMAL HIGH (ref 125–245)

## 2013-02-05 MED ORDER — HEPARIN SOD (PORK) LOCK FLUSH 100 UNIT/ML IV SOLN
500.0000 [IU] | Freq: Once | INTRAVENOUS | Status: AC
  Administered 2013-02-05: 500 [IU] via INTRAVENOUS
  Filled 2013-02-05: qty 5

## 2013-02-05 MED ORDER — SODIUM CHLORIDE 0.9 % IJ SOLN
10.0000 mL | INTRAMUSCULAR | Status: DC | PRN
  Administered 2013-02-05: 10 mL via INTRAVENOUS
  Filled 2013-02-05: qty 10

## 2013-02-05 NOTE — Telephone Encounter (Signed)
S/w pt to stop taking her oral iron per Dr Arline Asp, progress note is not dictated yet to tell her reasoning. Clarified that CT in august is without contrast

## 2013-02-05 NOTE — Progress Notes (Signed)
This office note has been dictated.  #[id number]

## 2013-02-05 NOTE — Patient Instructions (Signed)
Call MD for problems or concerns 

## 2013-02-06 NOTE — Progress Notes (Signed)
CC:   Georgann Housekeeper, MD [name], M.D. [name], MD [name], MD [name] [name], M.D.  PROBLEM LIST.:  1. 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 The patient  had a right pleural effusion at that time that was cytologically  negative. She received radiation treatments under the direction of  Dr. Margaretmary Dys, 6600 cGy in 33 fractions from January 27, 2011  through March 15, 2011, in combination with weekly carboplatin and  Taxotere. She was started on consolidative treatment with reduced  doses of carboplatin, gemcitabine, and Neulasta on May 12, 2011.  She has had significant thrombocytopenia not requiring transfusions.  Patient completed 4 cycles of consolidative chemotherapy, completed on 07/14/11.  Neulasta was omitted from cycle 4. CT scan of the chest without IV contrast carried out on 10/02/2012 showed no evidence of recurrent lung cancer.   2. Atrial fibrillation.  3. Peripheral neuropathy of feet and hands since 1998.  4. Hypertension.  5. Incontinence of urine.  6. History of shock, diarrhea, metabolic acidosis, and renal failure,  requiring admission to the ICU in April 2012.  7. History of left foot drop.  8. Anemia.  9. Renal insufficiency.  10. Admission to the hospital from 04/18/2012 through 04/25/2012 with  diarrhea, dehydration, worsening of renal insufficiency secondary  to Clostridium difficile colitis. The patient is about to finish  up her oral vancomycin. There was no history of prior antibiotic  exposure.  11. Anemia with unexplained drop in the patient's hemoglobin from 8.2  on 04/25/2012 to 5.6 on 05/01/2012. The patient received 2 units  of packed red cells. Stool cards on 05/08/2012 were negative for  occult blood. In part, the anemia may be secondary to the  patient's underlying renal insufficiency.  12. Bilateral screening mammogram from  05/04/2012 showed calcifications  involving the left breast. A core needle biopsy carried out on the left  breast on 07/13/2012 showed a fibroadenoma with associated calcifications. No atypia or malignancy was identified. 13. Right port-a-cath placed 01/20/11.   MEDICATIONS:  Reviewed and recorded. Current Outpatient Prescriptions  Medication Sig Dispense Refill  . cholecalciferol (VITAMIN D) 1000 UNITS tablet Take 1,000 Units by mouth daily.        Marland Kitchen diltiazem (CARDIZEM CD) 180 MG 24 hr capsule Take 180 mg by mouth daily.        . folic acid (FOLVITE) 1 MG tablet Take 1 mg by mouth daily.        Marland Kitchen lidocaine-prilocaine (EMLA) cream Apply 1 application topically as needed. For port-a-cath access.      . loratadine (CLARITIN) 10 MG tablet Take 10 mg by mouth daily.        . Multiple Vitamin (MULTIVITAMIN WITH MINERALS) TABS Take 1 tablet by mouth daily.      Marland Kitchen POLY-IRON 150 150 MG capsule TAKE ONE CAPSULE BY MOUTH TWICE DAILY  60 capsule  3  . Probiotic Product (ALIGN) 4 MG CAPS Take 4 mg by mouth daily.      Marland Kitchen thiamine (VITAMIN B-1) 100 MG tablet Take 100 mg by mouth daily.        No current facility-administered medications for this visit.    IMMUNIZATIONS: We have no records pertaining to Pneumovax or flu shots.  It was suggested that she consult with her primary care physician.    SMOKING HISTORY: The patient has smoked a pack cigarettes a day for 30  years. She  stopped smoking in April 2012 at the time of her lung cancer  diagnosis.    HISTORY:  I saw Madison [name] today for followup of her stage IIIA squamous cell carcinoma involving the right upper lobe with diagnosis established, in early April 2012.  Madison [name] was last seen by [place] on 11/26/2012.  She is here today with her husband [name].  We continue to maintain her Port-A-Catheter with heparin flushes every 2 months.  The patient denies any changes in her condition.  She feels generally well. She is here today in a  wheelchair but she uses a walker outside of the Cancer [facility].  The patient denies any change in her respiratory status. She denies any pain and is without any complaints today.  PHYSICAL EXAMINATION:  General:  She looks well.  VITAL SIGNS:  Weight is 149 pounds 8 ounces, height 5 feet 6 inches, body surface area 1.78 m square.  Blood pressure 148/90.  Other vital signs are normal.  O2 saturation on room air at rest was 100%.  HEENT:  She wears glasses.  No scleral icterus.  Mouth and pharynx are benign.  No peripheral adenopathy palpable.  Heart and lungs:  Normal.  Rhythm was regular. Breasts:  Not examined.  She did have mammograms back in late September 2013.  There is a right-sided Port-A-Cath which was flushed with heparin today.  Abdomen:  With the patient sitting is benign.  Extremities:  1 to 2+ bilateral ankle edema.  Neurologic exam:  As previously described. I did not test for station and gait.  In the past the patient has had a left foot drop and weakness in the hip flexors.  LABORATORY DATA:  White count 4.9, ANC 3.2, hemoglobin 9.7, hematocrit 28.8, platelets 162,000.  Chemistries:  Notable for a BUN of 27, creatinine 2.2, albumin 3.6.  LDH is currently pending.  IMAGING STUDIES:  1. PET scan from 12/30/2010 showed abnormal uptake within the upper  cervical spine and adjacent skull base which is incompletely  visualized. There was low level abnormal right perihilar uptake  compatible with the patient's diagnosis of lung cancer diagnosed at  bronchoscopy. Tumor involved the right upper lobe. An addendum to  the report stated that there were some alternative explanations for  the activity in the cervical spine that has more to do with  misregistration of data than the possibility of metastatic disease.  There was also noted that there was a low-grade activity in the  mediastinal adenopathy that was shown on the CT scan. The right  lower paratracheal lymph node measured  1.7 x 2.1 cm. There was a  2.4 x 2.1 cm precarinal lymph node, and a 3.3 x 2.1 cm subcarinal  lymph node.  2. CT scan of the chest without IV contrast from 04/19/2011 showed  improved appearance of the right chest compared with the PET scan  of 12/30/2010 and the CT scan of the chest from 11/10/2010. Right  upper lobe dense consolidation had resolved. Bilateral pleural  effusions had resolved. There was a decrease in the adenopathy  seen in the chest. There was an indeterminate right upper lobe  nodule that warranted further followup.  3. PET scan from 10/26/2011 showed interval resolution of the  hypermetabolic right hilar lymphadenopathy as well as near-complete  resolution of the mediastinal lymphadenopathy which showed no  significant hypermetabolic activity. There was no evidence for  extra-thoracic metastatic disease. It was noted also that the  activity seen in the region of the  skull base and C1 was no longer  visualized and was felt to be muscular artifact from the prior PET  scan of 12/30/2010. In reviewing this PET scan, it was suggested  that CT scans of the chest, ideally with IV contrast if patient's  renal function will allow, may give [place] the best method for followup.  4. CT scan of the chest without IV contrast on 05/01/2012 showed  stable borderline enlarged lower right paratracheal lymph node  measuring 1.2 cm. There were some tiny nodules within the right  upper lobe that are stable compared with previous exams.  Comparison was made with the CT scan of 10/26/2011.  5. Digital bilateral screening mammogram on 05/04/2012 showed  calcifications in the left breast. Further evaluation was  suggested.  6. Digital diagnostic left mammogram on 05/14/2012 showed  suspicious left breast calcifications. Stereotactic biopsy of the left  breast was recommended.  7. Stereotactic-guided vacuum-assisted biopsy of the left breast and  specimen radiograph was carried out on  07/13/2012. Pathology revealed a  fibroadenoma with calcifications.  8. CT scan of the chest without IV contrast on 10/02/2012 showed stable  appearance of the chest with right hilar distortion and adjacent  paramediastinal radiation changes. There was a stable right upper lobe  pulmonary nodule and centrally lucent lesion with probable surrounding  scar. There was no evidence of local recurrence or metastatic disease.  There was interval development of steatosis and a mild superior endplate  compression deformity at T12 when compared with the prior chest CT scan  from 05/01/2012.    IMPRESSION AND PLAN:  Ms. Opie is now out more than 2 years from the time of diagnosis and seems to be doing well without any signs of recurrent disease.  We are continuing to maintain her Port-A-Cath with heparin flushes every 2 months.  I have suggested to the patient that we leave the Port-A-Cath in for at least another year given the relatively high risk for recurrent disease.  I note that the patient is on iron.  We had checked iron studies back on 04/24/2012.  Iron saturation at that time was 67% and ferritin was 1567. We will have the patient stop her iron.  I spoke with Mrs. Benham about her anemia.  I suspect this is related to her renal insufficiency.  I offered to give her injections of erythropoietin stimulating agents.  The patient has declined this.  She apparently is tolerating her moderate anemia without any problems.  We will have Mrs. Mitten return in 2 months around August 26 at which time we will check CBC, chemistries, and CEA.  About 1 week prior to her appointment I have ordered a CT scan of the chest without IV contrast to compare it with the last CT scan from 10/02/2012.    ______________________________ Samul Dada, M.D. DSM/MEDQ  D:  02/05/2013  T:  02/06/2013  Job:  [id number]

## 2013-02-11 ENCOUNTER — Telehealth: Payer: Self-pay | Admitting: Oncology

## 2013-02-11 NOTE — Telephone Encounter (Signed)
Not able to reach pt or lm. vm full and sys asking for remote access code. Mailed August schedule lb/flush/ct/CP1. Changed time of ct 8/20 to coord w/lb and flush. S/w Carrie in [place]. New time for CT indicated on schedule/AVS sent to patient.

## 2013-02-17 IMAGING — CR DG CHEST 1V PORT
1 series · 1 of 1 positions shown · non-contrast
Comparison: 11/25/2010

CLINICAL DATA: Fever.

PORTABLE CHEST - 1 VIEW

[AP]
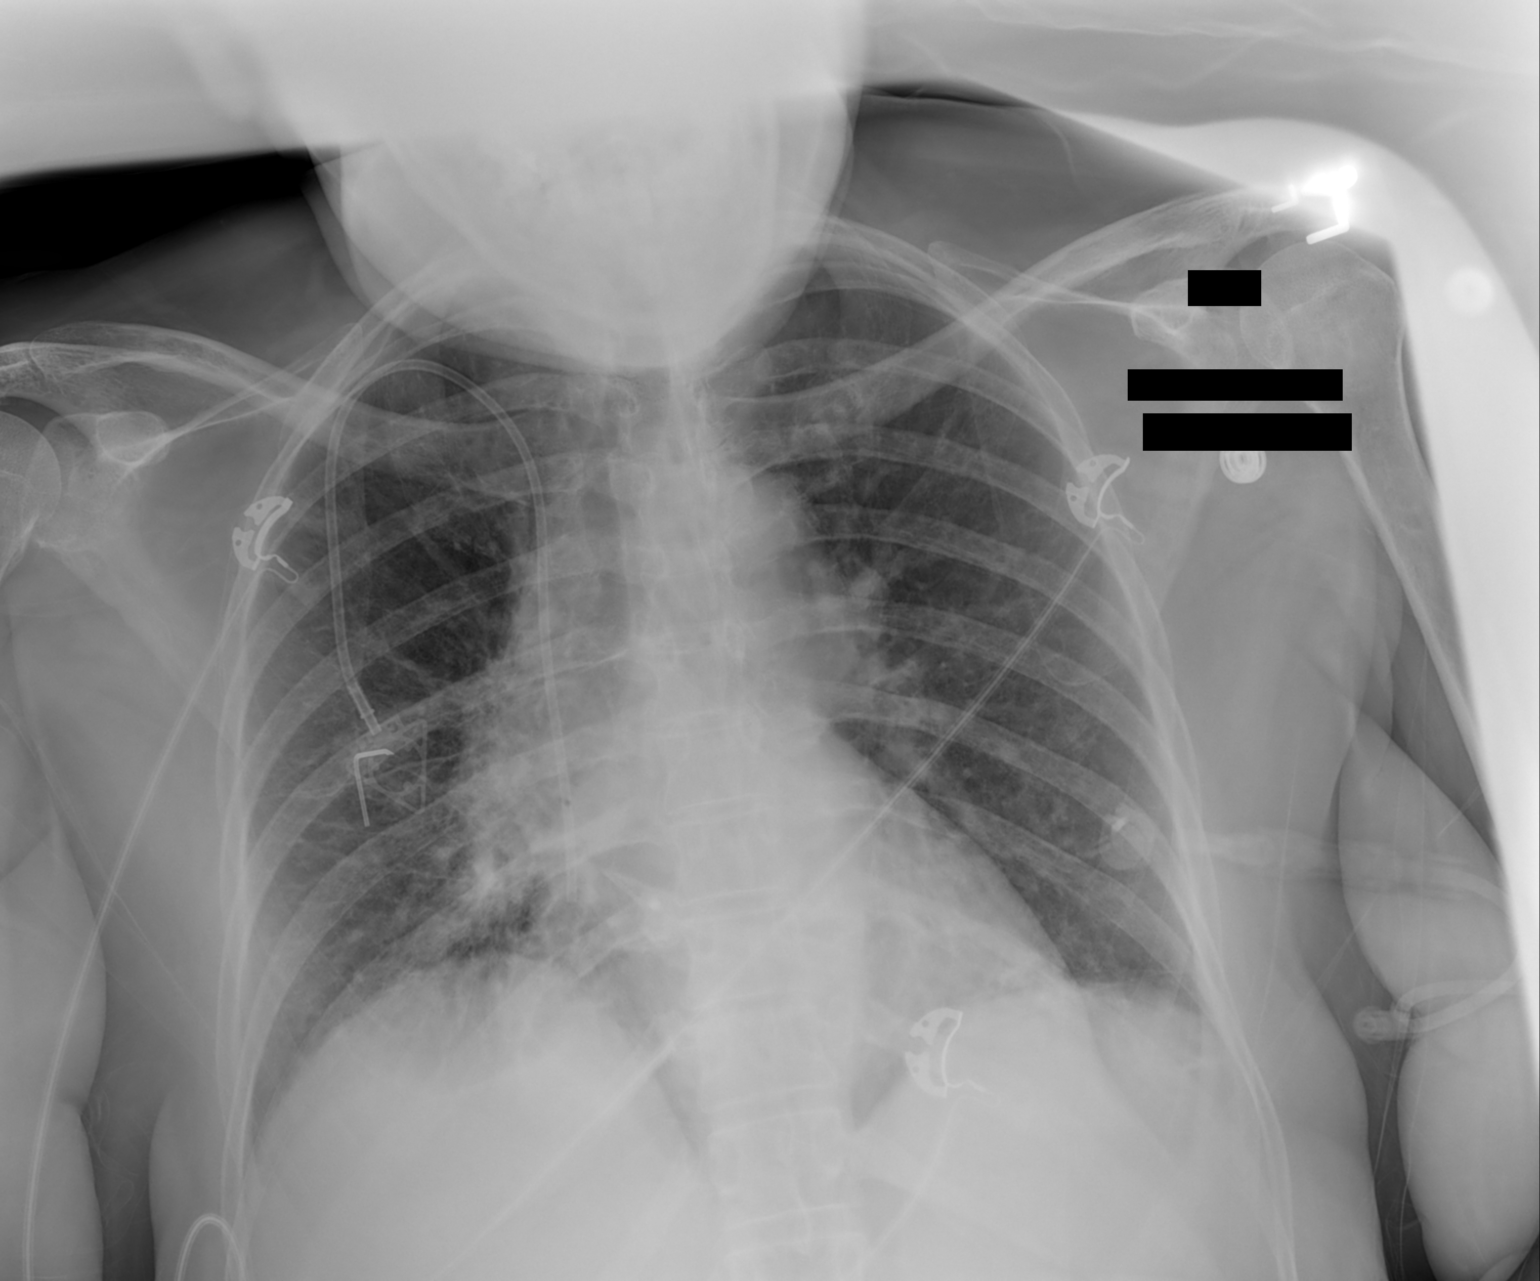

[1 of 1 positions shown; findings below may reference images not displayed]

FINDINGS: Shallow inspiration.  Normal heart size and pulmonary
vascularity.  Mass suggested in the right hilum with consolidation
in the right mid and lower lung region.  Diffuse interstitial
fibrosis in the lungs.  No blunting of costophrenic angles.  No
pneumothorax.  There is improvement since previous study with no
new infiltrates identified.  Interval placement of a power port
type right central venous catheter with tip over the low SVC
region.
IMPRESSION: Right hilar mass with right mid and lower lung consolidation.
Diffuse interstitial changes.  Improved parenchymal infiltrates and
effusions since previous study.

## 2013-02-18 IMAGING — CR DG CHEST 1V PORT
1 series · 1 of 1 positions shown · non-contrast
Comparison: Portable chest x-ray of 04/19/2012

CLINICAL DATA: Infiltrates, possible pneumonia

PORTABLE CHEST - 1 VIEW

[AP]
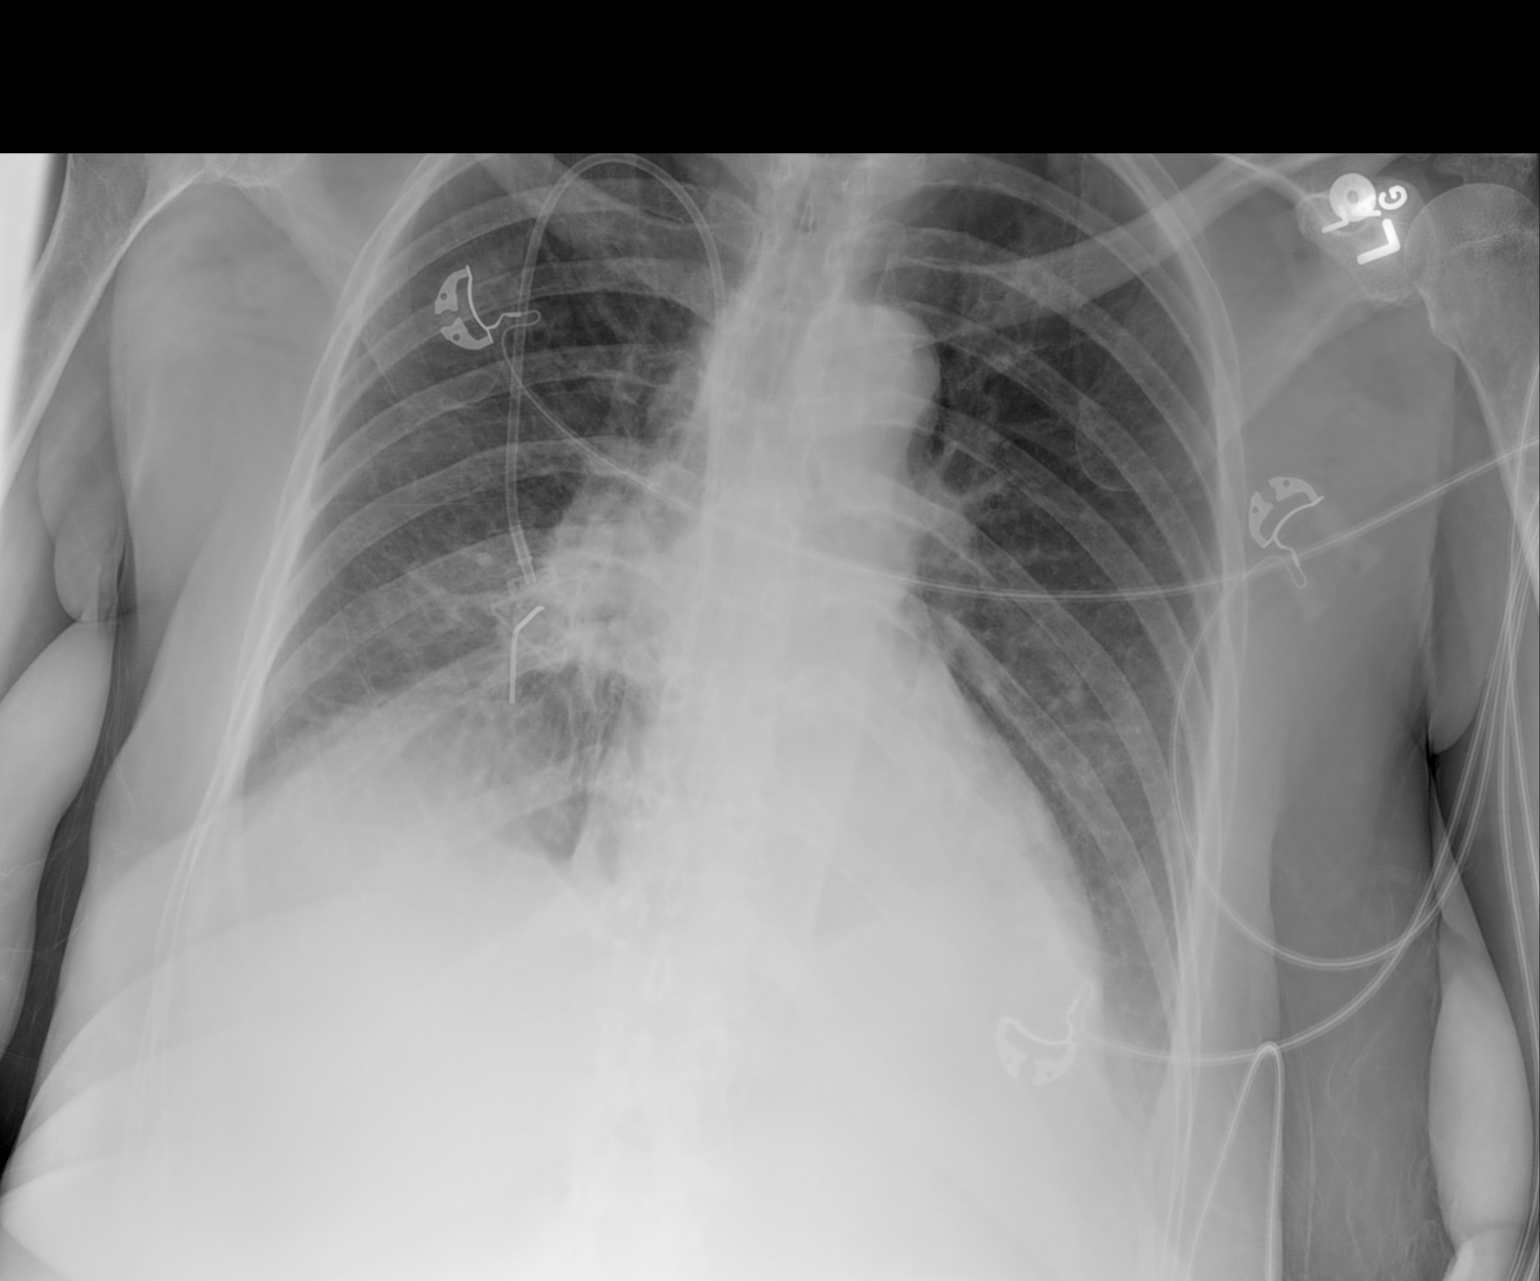

[1 of 1 positions shown; findings below may reference images not displayed]

FINDINGS: There is more opacity at both lung bases which may
reflect atelectasis or possibly pneumonia.  Also, there may be a
small left effusion present.  Heart size is stable.  Right Port-A-
Cath is unchanged in position.
IMPRESSION: Increase in basilar opacities left greater than right.  Cannot
exclude pneumonia with possible left effusion.

## 2013-03-13 ENCOUNTER — Other Ambulatory Visit: Payer: Self-pay

## 2013-03-27 ENCOUNTER — Ambulatory Visit (HOSPITAL_COMMUNITY)
Admission: RE | Admit: 2013-03-27 | Discharge: 2013-03-27 | Disposition: A | Discharge status: Home / Self Care | Payer: Medicare Other | Source: Ambulatory Visit | Attending: Oncology | Admitting: Oncology

## 2013-03-27 ENCOUNTER — Telehealth: Payer: Self-pay

## 2013-03-27 ENCOUNTER — Telehealth: Payer: Self-pay | Admitting: Hematology and Oncology

## 2013-03-27 ENCOUNTER — Ambulatory Visit (HOSPITAL_BASED_OUTPATIENT_CLINIC_OR_DEPARTMENT_OTHER): Payer: Medicare Other

## 2013-03-27 ENCOUNTER — Other Ambulatory Visit (HOSPITAL_COMMUNITY): Payer: Medicare Other

## 2013-03-27 ENCOUNTER — Other Ambulatory Visit (HOSPITAL_BASED_OUTPATIENT_CLINIC_OR_DEPARTMENT_OTHER): Payer: Medicare Other | Admitting: Lab

## 2013-03-27 DIAGNOSIS — Z452 Encounter for adjustment and management of vascular access device: Secondary | ICD-10-CM

## 2013-03-27 DIAGNOSIS — M479 Spondylosis, unspecified: Secondary | ICD-10-CM | POA: Insufficient documentation

## 2013-03-27 DIAGNOSIS — C3490 Malignant neoplasm of unspecified part of unspecified bronchus or lung: Secondary | ICD-10-CM | POA: Insufficient documentation

## 2013-03-27 DIAGNOSIS — M439 Deforming dorsopathy, unspecified: Secondary | ICD-10-CM | POA: Insufficient documentation

## 2013-03-27 DIAGNOSIS — J438 Other emphysema: Secondary | ICD-10-CM | POA: Insufficient documentation

## 2013-03-27 LAB — COMPREHENSIVE METABOLIC PANEL (CC13)
ALT: 33 U/L (ref 0–55)
AST: 97 U/L — ABNORMAL HIGH (ref 5–34)
Albumin: 3.7 g/dL (ref 3.5–5.0)
Alkaline Phosphatase: 149 U/L (ref 40–150)
Calcium: 9.2 mg/dL (ref 8.4–10.4)
Chloride: 108 mEq/L (ref 98–109)
Potassium: 4.5 mEq/L (ref 3.5–5.1)
Sodium: 143 mEq/L (ref 136–145)
Total Protein: 7.9 g/dL (ref 6.4–8.3)

## 2013-03-27 LAB — CBC WITH DIFFERENTIAL/PLATELET
BASO%: 0.5 % (ref 0.0–2.0)
EOS%: 0.6 % (ref 0.0–7.0)
HCT: 28.5 % — ABNORMAL LOW (ref 34.8–46.6)
MCH: 34.4 pg — ABNORMAL HIGH (ref 25.1–34.0)
MCHC: 33.7 g/dL (ref 31.5–36.0)
MONO#: 0.7 10*3/uL (ref 0.1–0.9)
NEUT%: 65.5 % (ref 38.4–76.8)
RDW: 14.5 % (ref 11.2–14.5)
WBC: 6.2 10*3/uL (ref 3.9–10.3)
lymph#: 1.4 10*3/uL (ref 0.9–3.3)
nRBC: 0 % (ref 0–0)

## 2013-03-27 MED ORDER — HEPARIN SOD (PORK) LOCK FLUSH 100 UNIT/ML IV SOLN
500.0000 [IU] | Freq: Once | INTRAVENOUS | Status: DC
  Filled 2013-03-27: qty 5

## 2013-03-27 MED ORDER — SODIUM CHLORIDE 0.9 % IJ SOLN
10.0000 mL | INTRAMUSCULAR | Status: DC | PRN
  Administered 2013-03-27: 10 mL via INTRAVENOUS
  Filled 2013-03-27: qty 10

## 2013-03-27 NOTE — Telephone Encounter (Signed)
, °

## 2013-03-27 NOTE — Telephone Encounter (Signed)
Faxed CT chest to Dr Clent Ridges per Dr Roanna Raider

## 2013-03-27 NOTE — Telephone Encounter (Signed)
On prior phone call,  pt stated she fell in her bathroom last week. She is sitting on donut pillow and has to take time to get up and down from chair. This information was included on fax of CT to Dr Loleta Chance.

## 2013-03-27 NOTE — Telephone Encounter (Signed)
S/w pt about new L1 compression fracture. She said her new PCP is Dr Mirna Mires at [facility]. Will fax CT to Dr Loleta Chance.

## 2013-04-02 ENCOUNTER — Ambulatory Visit: Payer: Medicare Other

## 2013-04-04 ENCOUNTER — Telehealth: Payer: Self-pay | Admitting: Hematology and Oncology

## 2013-04-04 ENCOUNTER — Ambulatory Visit (HOSPITAL_BASED_OUTPATIENT_CLINIC_OR_DEPARTMENT_OTHER): Payer: Medicare Other | Admitting: Hematology and Oncology

## 2013-04-04 VITALS — BP 151/74 | HR 98 | Temp 97.6°F | Resp 18 | Ht 66.0 in | Wt 142.9 lb

## 2013-04-04 DIAGNOSIS — D649 Anemia, unspecified: Secondary | ICD-10-CM

## 2013-04-04 DIAGNOSIS — G609 Hereditary and idiopathic neuropathy, unspecified: Secondary | ICD-10-CM

## 2013-04-04 DIAGNOSIS — Z85118 Personal history of other malignant neoplasm of bronchus and lung: Secondary | ICD-10-CM

## 2013-04-04 MED ORDER — GABAPENTIN 300 MG PO CAPS: 300.0000 mg | ORAL_CAPSULE | Freq: Three times a day (TID) | ORAL | Status: DC

## 2013-04-04 NOTE — Telephone Encounter (Signed)
s.w. pt and advised on OCT appt....pt requested mailed sched.Marland KitchenMarland KitchenDone

## 2013-04-16 ENCOUNTER — Telehealth: Payer: Self-pay | Admitting: Internal Medicine

## 2013-04-16 NOTE — Telephone Encounter (Signed)
Returned pt's call re not having port flush appts on schedule. Added flush appts for 10/1 and 11/12 and moved pt from CP2 back to CP1- pt aware. Pt also mentioned lb w/flush and is aware I could not add lb w/o order. Message to desk nurse (robin) to see it pt should be having lbs w/flush appts.

## 2013-04-16 NOTE — Progress Notes (Signed)
ID: Madison [name] OB: 09-17-1945  MR#: [id number]  NWG#:[id number]   [facility]  [phone] [phone]) [phone]   OFFICE PROGRESS NOTE  PCP: [name], MD  PROBLEM LIST.:  1. 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 The patient  had a right pleural effusion at that time that was cytologically  negative. She received radiation treatments under the direction of  Dr. Margaretmary Dys, 6600 cGy in 33 fractions from January 27, 2011  through March 15, 2011, in combination with weekly carboplatin and  Taxotere. She was started on consolidative treatment with reduced  doses of carboplatin, gemcitabine, and Neulasta on May 12, 2011.  She has had significant thrombocytopenia not requiring transfusions.  Patient completed 4 cycles of consolidative chemotherapy, completed on 07/14/11.  Neulasta was omitted from cycle 4. CT scan of the chest without IV contrast carried out on 10/02/2012 showed no evidence of recurrent lung cancer.  2. Atrial fibrillation.  3. Peripheral neuropathy of feet and hands since 1998.  4. Hypertension.  5. Incontinence of urine.  6. History of shock, diarrhea, metabolic acidosis, and renal failure,  requiring admission to the ICU in April 2012.  7. History of left foot drop.  8. Anemia.  9. Renal insufficiency.  10. Admission to the hospital from 04/18/2012 through 04/25/2012 with  diarrhea, dehydration, worsening of renal insufficiency secondary  to Clostridium difficile colitis. The patient is about to finish  up her oral vancomycin. There was no history of prior antibiotic  exposure.  11. Anemia with unexplained drop in the patient's hemoglobin from 8.2  on 04/25/2012 to 5.6 on 05/01/2012. The patient received 2 units  of packed red cells. Stool cards on 05/08/2012 were negative for  occult blood. In part, the anemia may be secondary to the  patient's  underlying renal insufficiency.  12. Bilateral screening mammogram from 05/04/2012 showed calcifications  involving the left breast. A core needle biopsy carried out on the left  breast on 07/13/2012 showed a fibroadenoma with associated calcifications. No atypia or malignancy was identified.  13. Right port-a-cath placed 01/20/11.  INTERVAL HISTORY: Patient is a 67 y.o. female who presented for follow up visit. Her appetite is fair. Occasionally she has nasal discharge. 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 The patient denied fever, chills, night sweats, change in weight. She denied headaches, double vision, blurry vision, nasal congestion,  hearing problems, odynophagia or dysphagia. No chest pain, palpitations, dyspnea, cough, abdominal pain, nausea, vomiting, diarrhea, constipation, hematochezia. The patient denied dysuria, nocturia, polyuria, hematuria, myalgia,  tingling, psychiatric problems.  Review of Systems  Constitutional: Negative for fever, chills, weight loss, malaise/fatigue and diaphoresis.  HENT: Negative for hearing loss, nosebleeds, congestion, sore throat, neck pain, tinnitus and ear discharge.   Eyes: Negative for blurred vision, double vision, photophobia and pain.  Respiratory: Negative for cough, hemoptysis, sputum production, shortness of breath and wheezing.   Cardiovascular: Negative for chest pain, palpitations, orthopnea, claudication, leg swelling and PND.  Gastrointestinal: Negative for heartburn, nausea, vomiting, abdominal pain, diarrhea, constipation, blood in stool and melena.  Genitourinary: Negative for dysuria, urgency, frequency and hematuria.  Musculoskeletal: Negative for myalgias, back pain, joint pain and falls.  Skin: Negative for itching and rash.  Neurological: Positive for sensory change. Negative for dizziness, tingling, tremors, speech change, focal weakness, seizures, loss of consciousness, weakness and headaches.    Endo/Heme/Allergies: Does not bruise/bleed easily.  Psychiatric/Behavioral: Negative.  PAST MEDICAL HISTORY: Past Medical History  Diagnosis Date  . Hypertension   . Anemia   . Atrial fibrillation   . Chronic kidney disease   . lung ca     lung ca  . Lung cancer 11/11/10    PAST SURGICAL HISTORY: Past Surgical History  Procedure Laterality Date  . Port a cath placement      FAMILY HISTORY Family History  Problem Relation Age of Onset  . Diabetes Mother   . Heart attack Father   . Hypertension Father   . Cancer Sister   . Stroke Brother   . Hypertension Brother   . Heart attack Brother   . Hypertension Brother    HEALTH MAINTENANCE: History  Substance Use Topics  . Smoking status: Former Smoker -- 1.00 packs/day for 40 years    Types: Cigarettes    Quit date: 11/09/2010  . Smokeless tobacco: Never Used  . Alcohol Use: No    Allergies  Allergen Reactions  . Lisinopril     Makes face swell    Current Outpatient Prescriptions  Medication Sig Dispense Refill  . diltiazem (CARDIZEM CD) 180 MG 24 hr capsule Take 180 mg by mouth daily.        . folic acid (FOLVITE) 1 MG tablet Take 1 mg by mouth daily.        Marland Kitchen lidocaine-prilocaine (EMLA) cream Apply 1 application topically as needed. For port-a-cath access.      . loratadine (CLARITIN) 10 MG tablet Take 10 mg by mouth daily.        . Multiple Vitamin (MULTIVITAMIN WITH MINERALS) TABS Take 1 tablet by mouth daily.      Marland Kitchen POLY-IRON 150 150 MG capsule TAKE ONE CAPSULE BY MOUTH TWICE DAILY  60 capsule  3  . Probiotic Product (ALIGN) 4 MG CAPS Take 4 mg by mouth daily.      Marland Kitchen thiamine (VITAMIN B-1) 100 MG tablet Take 100 mg by mouth daily.       . cholecalciferol (VITAMIN D) 1000 UNITS tablet Take 1,000 Units by mouth daily.        Marland Kitchen gabapentin (NEURONTIN) 300 MG capsule Take 1 capsule (300 mg total) by mouth 3 (three) times daily.  90 capsule  1   No current facility-administered medications for this visit.     OBJECTIVE: Filed Vitals:   04/04/13 0910  BP: 151/74  Pulse: 98  Temp: 97.6 F (36.4 C)  Resp: 18     Body mass index is 23.08 kg/(m^2).      PHYSICAL EXAMINATION:  HEENT: Sclerae anicteric.  Conjunctivae were pink. Pupils round and reactive bilaterally. Oral mucosa is moist without ulceration or thrush. No occipital, submandibular, cervical, supraclavicular or axillar adenopathy. Lungs: clear to auscultation without wheezes. No rales or rhonchi. Heart: regular rate and rhythm. No murmur, gallop or rubs. Abdomen: soft, non tender. No guarding or rebound tenderness. Bowel sounds are present. No palpable hepatosplenomegaly. MSK: no focal spinal tenderness. Extremities: No clubbing or cyanosis.No calf tenderness to palpitation, no peripheral edema. The patient had grossly intact strength in upper and lower extremities. Skin exam was without ecchymosis, petechiae. Neuro: non-focal, alert and oriented to time, person and place, appropriate affect  LAB RESULTS:  CMP     Component Value Date/Time   NA 143 03/27/2013 0923   NA 140 04/25/2012 0530   K 4.5 03/27/2013 0923   K 4.3 04/25/2012 0530   CL 108* 11/26/2012 1025   CL 110 04/25/2012 0530  CO2 16* 03/27/2013 0923   CO2 16* 04/25/2012 0530   GLUCOSE 73 03/27/2013 0923   GLUCOSE 72 11/26/2012 1025   GLUCOSE 91 04/25/2012 0530   BUN 26.0 03/27/2013 0923   BUN 63* 04/25/2012 0530   CREATININE 2.1* 03/27/2013 0923   CREATININE 3.84* 04/25/2012 0530   CREATININE 1.55* 01/27/2012 0921   CALCIUM 9.2 03/27/2013 0923   CALCIUM 6.9* 04/25/2012 0530   PROT 7.9 03/27/2013 0923   PROT 5.1* 04/22/2012 1456   ALBUMIN 3.7 03/27/2013 0923   ALBUMIN 2.0* 04/22/2012 1456   AST 97* 03/27/2013 0923   AST 21 04/22/2012 1456   ALT 33 03/27/2013 0923   ALT 23 04/22/2012 1456   ALKPHOS 149 03/27/2013 0923   ALKPHOS 70 04/22/2012 1456   BILITOT 0.77 03/27/2013 0923   BILITOT 0.6 04/22/2012 1456   GFRNONAA 11* 04/25/2012 0530   GFRAA 13* 04/25/2012 0530    Lab  Results  Component Value Date   WBC 6.2 03/27/2013   NEUTROABS 4.1 03/27/2013   HGB 9.6* 03/27/2013   HCT 28.5* 03/27/2013   MCV 102.2* 03/27/2013   PLT 141* 03/27/2013      Chemistry      Component Value Date/Time   NA 143 03/27/2013 0923   NA 140 04/25/2012 0530   K 4.5 03/27/2013 0923   K 4.3 04/25/2012 0530   CL 108* 11/26/2012 1025   CL 110 04/25/2012 0530   CO2 16* 03/27/2013 0923   CO2 16* 04/25/2012 0530   BUN 26.0 03/27/2013 0923   BUN 63* 04/25/2012 0530   CREATININE 2.1* 03/27/2013 0923   CREATININE 3.84* 04/25/2012 0530   CREATININE 1.55* 01/27/2012 0921      Component Value Date/Time   CALCIUM 9.2 03/27/2013 0923   CALCIUM 6.9* 04/25/2012 0530   ALKPHOS 149 03/27/2013 0923   ALKPHOS 70 04/22/2012 1456   AST 97* 03/27/2013 0923   AST 21 04/22/2012 1456   ALT 33 03/27/2013 0923   ALT 23 04/22/2012 1456   BILITOT 0.77 03/27/2013 0923   BILITOT 0.6 04/22/2012 1456      STUDIES: Ct Chest Wo Contrast  03/27/2013   *RADIOLOGY REPORT*  Clinical Data: Lung cancer follow-up  CT CHEST WITHOUT CONTRAST  Technique:  Multidetector CT imaging of the chest was performed following the standard protocol without IV contrast.  Comparison: 10/02/2012  Findings:  There is no pleural effusion identified.  There is mild centrilobular emphysema identified.  Paramediastinal radiation change is identified within the right lung and appears stable. There is a stable sub solid lesion within the right upper lobe measuring 9 mm, image 21/series 5.  Adjacent nodule is stable measuring 4 mm, image 22/series 5.  The heart size is normal.  There is no pericardial effusion.  There is no enlarged mediastinal or hilar adenopathy.  No axillary or supraclavicular adenopathy identified.  Limited imaging through the upper abdomen shows no acute findings. There is mild diffuse low attenuation within the liver suggestive of fatty infiltration.  Perinephric fat stranding is again noted bilaterally.  Review of the visualized osseous  structures is significant for mild spondylosis.  Stable superior compression deformity involving the T12 vertebra.  There is a new superior endplate compression deformity involving T11 with loss of approximately 15% of the vertebral body height.  IMPRESSION:  1.  Stable appearance of the chest with radiation change in the right lung. 2.  Solid and adjacent sub solid lesions in the right upper lobe are unchanged from previous exam. 3.  New L1 compression deformity.   Original Report Authenticated By: Signa Kell, M.D.    ASSESSMENT AND PLAN: 1. 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 CT chest from 03/27/13 did not show recurrence of disease. Patient is stable and has no symptoms, except neuropathy. 2. Neuropathy. Neurontin was offer to patient. 3. Anemia. Probably secondary to chronic kidney disease. Can stop to take iron supplement. 4. Port-A-Cath with  heparin flushes every 2 months. We will have Mrs. Kasik return in 2 months around August 26 at which  time we will check CBC, chemistries, and CEA. About 1 week prior to her  appointment I have ordered a CT scan of the chest without IV contrast to  compare it with the last CT scan from 10/02/2012.  5. Follow up in 2 months   Myra Rude, MD   04/05/2013 4:48 AM

## 2013-04-19 NOTE — Addendum Note (Signed)
Addended by: Mathis Dad on: 04/19/2013 11:08 AM   Modules accepted: Kipp Brood

## 2013-05-06 ENCOUNTER — Ambulatory Visit (HOSPITAL_BASED_OUTPATIENT_CLINIC_OR_DEPARTMENT_OTHER): Payer: Medicare Other

## 2013-05-06 VITALS — BP 155/74 | HR 100 | Temp 98.3°F | Resp 16

## 2013-05-06 DIAGNOSIS — C3490 Malignant neoplasm of unspecified part of unspecified bronchus or lung: Secondary | ICD-10-CM

## 2013-05-06 DIAGNOSIS — Z452 Encounter for adjustment and management of vascular access device: Secondary | ICD-10-CM

## 2013-05-06 MED ORDER — HEPARIN SOD (PORK) LOCK FLUSH 100 UNIT/ML IV SOLN
500.0000 [IU] | Freq: Once | INTRAVENOUS | Status: AC
  Administered 2013-05-06: 500 [IU] via INTRAVENOUS
  Filled 2013-05-06: qty 5

## 2013-05-06 MED ORDER — SODIUM CHLORIDE 0.9 % IJ SOLN
10.0000 mL | INTRAMUSCULAR | Status: DC | PRN
  Administered 2013-05-06: 10 mL via INTRAVENOUS
  Filled 2013-05-06: qty 10

## 2013-05-08 ENCOUNTER — Telehealth: Payer: Self-pay | Admitting: Cardiovascular Disease

## 2013-05-08 ENCOUNTER — Telehealth: Payer: Self-pay | Admitting: Internal Medicine

## 2013-05-08 NOTE — Telephone Encounter (Signed)
Returned pt's call re wanting a sooner appt due concerns about swelling in her feet. Not able to reach pt or lm - memory full. Pt's message was forwarded to desk nurse re this concern.

## 2013-05-08 NOTE — Telephone Encounter (Signed)
New Problem  Pt states her feet are swelling// to prevent an appointment she request a call from the nurse with alternatives to keep her feet from swelling.

## 2013-05-08 NOTE — Telephone Encounter (Signed)
The pt was last seen 01/18/12. I spoke with the pt and she complained of bilateral swelling in her feet that started one week ago.  The pt said her swelling does go down during the night and returns throughout the day.  The pt said she had purchased a new pair of shoes due to swelling and they cut across the top of her feet and this caused her to "bruise" or injure her feet.  I instructed the pt to decrease the salt in her diet, elevate legs when able and wear compression stockings.   The pt agreed with plan.  I also scheduled the pt for a ROV with Dr Excell Seltzer. The pt will call the office with any other questions or concerns.

## 2013-05-30 ENCOUNTER — Telehealth: Payer: Self-pay | Admitting: Internal Medicine

## 2013-05-30 ENCOUNTER — Other Ambulatory Visit (HOSPITAL_BASED_OUTPATIENT_CLINIC_OR_DEPARTMENT_OTHER): Payer: Medicare Other | Admitting: Lab

## 2013-05-30 ENCOUNTER — Ambulatory Visit (HOSPITAL_BASED_OUTPATIENT_CLINIC_OR_DEPARTMENT_OTHER): Payer: Medicare Other | Admitting: Internal Medicine

## 2013-05-30 ENCOUNTER — Other Ambulatory Visit: Payer: Self-pay | Admitting: Internal Medicine

## 2013-05-30 DIAGNOSIS — N189 Chronic kidney disease, unspecified: Secondary | ICD-10-CM

## 2013-05-30 DIAGNOSIS — D249 Benign neoplasm of unspecified breast: Secondary | ICD-10-CM

## 2013-05-30 DIAGNOSIS — C3410 Malignant neoplasm of upper lobe, unspecified bronchus or lung: Secondary | ICD-10-CM

## 2013-05-30 DIAGNOSIS — K760 Fatty (change of) liver, not elsewhere classified: Secondary | ICD-10-CM

## 2013-05-30 DIAGNOSIS — D649 Anemia, unspecified: Secondary | ICD-10-CM

## 2013-05-30 DIAGNOSIS — R7401 Elevation of levels of liver transaminase levels: Secondary | ICD-10-CM

## 2013-05-30 DIAGNOSIS — D242 Benign neoplasm of left breast: Secondary | ICD-10-CM

## 2013-05-30 DIAGNOSIS — N289 Disorder of kidney and ureter, unspecified: Secondary | ICD-10-CM

## 2013-05-30 LAB — CBC WITH DIFFERENTIAL/PLATELET
Eosinophils Absolute: 0 10*3/uL (ref 0.0–0.5)
MCV: 101.1 fL — ABNORMAL HIGH (ref 79.5–101.0)
MONO#: 0.7 10*3/uL (ref 0.1–0.9)
MONO%: 14.3 % — ABNORMAL HIGH (ref 0.0–14.0)
NEUT#: 3.1 10*3/uL (ref 1.5–6.5)
RBC: 2.66 10*6/uL — ABNORMAL LOW (ref 3.70–5.45)
RDW: 15.7 % — ABNORMAL HIGH (ref 11.2–14.5)
nRBC: 0 % (ref 0–0)

## 2013-05-30 LAB — COMPREHENSIVE METABOLIC PANEL (CC13)
ALT: 66 U/L — ABNORMAL HIGH (ref 0–55)
Anion Gap: 15 mEq/L — ABNORMAL HIGH (ref 3–11)
CO2: 15 mEq/L — ABNORMAL LOW (ref 22–29)
Sodium: 142 mEq/L (ref 136–145)
Total Bilirubin: 1.15 mg/dL (ref 0.20–1.20)
Total Protein: 7 g/dL (ref 6.4–8.3)

## 2013-05-30 MED ORDER — HEPARIN SOD (PORK) LOCK FLUSH 100 UNIT/ML IV SOLN
500.0000 [IU] | Freq: Once | INTRAVENOUS | Status: AC
  Administered 2013-05-30: 500 [IU] via INTRAVENOUS
  Filled 2013-05-30: qty 5

## 2013-05-30 MED ORDER — SODIUM CHLORIDE 0.9 % IJ SOLN
10.0000 mL | INTRAMUSCULAR | Status: DC | PRN
  Administered 2013-05-30: 10 mL via INTRAVENOUS
  Filled 2013-05-30: qty 10

## 2013-05-30 NOTE — Telephone Encounter (Signed)
Gave pt appt for lab , flush and MD on December 2014

## 2013-05-30 NOTE — Progress Notes (Signed)
[facility] OFFICE PROGRESS NOTE  [name],[name], MD [address] [place] [address] [employer] [place] [address]  DIAGNOSIS: Lung cancer, unspecified laterality - Plan: heparin lock flush 100 unit/mL, sodium chloride 0.9 % injection 10 mL, CBC with Differential, Comprehensive metabolic panel  Anemia - Plan: CBC with Differential, Comprehensive metabolic panel  Renal insufficiency - Plan: CBC with Differential, Comprehensive metabolic panel  Elevated transaminase level  Fatty liver  Fibroadenoma of left breast  Chief Complaint  Patient presents with  . Lung cancer, right    CURRENT THERAPY:  INTERVAL HISTORY: Madison [name] 67 y.o. 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 She is accompanied by her husband Madison [name].  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 She did not receive the flu shot.  She stopped taking the gabapentin because it made her feel sick.   She denies recent hospitalizations or emergency room visit.  She is followed by Kidney specialist (Dr. Lowell [name]) regularly.  She gets around with a  walker and at times a cane.  She is scheduled for a mammogram this December.  Her last diagnostic mammogram demonstrated left breast calcification with biopsy demonstrating fibroadenoma with calcifications.    The patient denied fever, chills, night sweats, change in weight. She denied headaches, double vision, blurry vision, nasal congestion, hearing problems, odynophagia or dysphagia. No chest pain, palpitations, dyspnea, cough, abdominal pain, nausea, vomiting, diarrhea, constipation, hematochezia. The patient denied dysuria, nocturia, polyuria, hematuria, myalgia, psychiatric problems.  MEDICAL HISTORY: Past Medical History  Diagnosis Date  . Hypertension   . Anemia   . Atrial fibrillation   . Chronic kidney disease   . lung ca     lung ca  . Lung cancer 11/11/10     INTERIM HISTORY: has Lung cancer; Anemia; Renal insufficiency; Atrial fibrillation; ARF (acute renal failure); Diarrhea; SIRS (systemic inflammatory response syndrome); Metabolic acidosis; LV dysfunction; Dehydration; Breast mass, left; Elevated transaminase level; Fatty liver; and Fibroadenoma of left breast on her problem list.    ALLERGIES:  is allergic to lisinopril.  MEDICATIONS: has a current medication list which includes the following prescription(s): b complex-biotin-fa, cholecalciferol, diltiazem, lidocaine-prilocaine, loratadine, multivitamin with minerals, and probiotic product, and the following Facility-Administered Medications: sodium chloride.  SURGICAL HISTORY:  Past Surgical History  Procedure Laterality Date  . Port a cath placement     PROBLEM LIST.:  1. 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 The patient had a right pleural effusion at that time that was cytologically negative. She received radiation treatments under the direction of Dr. Margaretmary [name], 6600 cGy in 33 fractions from January 27, 2011 through March 15, 2011, in combination with weekly carboplatin and Taxotere. She was started on consolidative treatment with reduced doses of carboplatin, gemcitabine, and Neulasta on May 12, 2011. She has had significant thrombocytopenia not requiring transfusions. Patient completed 4 cycles of consolidative chemotherapy, completed on 07/14/11.  Neulasta was omitted from cycle 4. CT scan of the chest without IV contrast carried out on 10/02/2012 showed no evidence of recurrent lung cancer.  2. Atrial fibrillation.  3. Peripheral neuropathy of feet and hands since 1998.  4. Hypertension.  5. Incontinence of urine.  6. History of shock, diarrhea, metabolic acidosis, and renal failure, requiring admission to the ICU in April 2012.  7. History of left foot drop.  8. Anemia.  9.  Renal insufficiency.   10. Admission to the hospital from 04/18/2012 through 04/25/2012 with diarrhea, dehydration, worsening of renal insufficiency secondary to Clostridium difficile colitis. The patient is about to finish up her oral vancomycin. There was no history of prior antibiotic exposure.  11. Anemia with unexplained drop in the patient's hemoglobin from 8.2 on 04/25/2012 to 5.6 on 05/01/2012. The patient received 2 units of packed red cells. Stool cards on 05/08/2012 were negative for occult blood. In part, the anemia may be secondary to the patient's underlying renal insufficiency.  12. Bilateral screening mammogram from 05/04/2012 showed calcifications involving the left breast. A core needle biopsy carried out on the left breast on 07/13/2012 showed a fibroadenoma with associated calcifications. No atypia or malignancy was identified.  13. Right port-a-cath placed 01/20/11  REVIEW OF SYSTEMS:   Constitutional: Denies fevers, chills or abnormal weight loss Eyes: Denies blurriness of vision Ears, nose, mouth, throat, and face: Denies mucositis or sore throat Respiratory: Denies cough, dyspnea or wheezes Cardiovascular: Denies palpitation, chest discomfort or lower extremity swelling Gastrointestinal:  Denies nausea, heartburn or change in bowel habits Skin: Denies abnormal skin rashes Lymphatics: Denies new lymphadenopathy or easy bruising Neurological:Denies numbness, tingling of the hands but reports tingling in the feet that is longstanding but denies new weaknesses Behavioral/Psych: Mood is stable, no new changes  All other systems were reviewed with the patient and are negative.  PHYSICAL EXAMINATION: ECOG PERFORMANCE STATUS: 2 - Symptomatic, <50% confined to bed  Blood pressure 151/80, pulse 101, temperature 97.5 F (36.4 C), temperature source Oral, resp. rate 18, height 5\' 6"  (1.676 m), weight 146 lb 3.2 oz (66.316 kg).  GENERAL:alert, no distress and comfortable SKIN: skin color, texture,  turgor are normal, no rashes or significant lesions; R port a cath EYES: normal, Conjunctiva are pink and non-injected, sclera clear OROPHARYNX:no exudate, no erythema and lips, buccal mucosa, and tongue normal  NECK: supple, thyroid normal size, non-tender, without nodularity LYMPH:  no palpable lymphadenopathy in the cervical, axillary or supraclavicular LUNGS: clear to auscultation and percussion with normal breathing effort HEART: regular rate & rhythm and no murmurs and puffy  ankles, left greater than right, without pitting.  ABDOMEN:abdomen soft, non-tender and normal bowel sounds Musculoskeletal:no cyanosis of digits and no clubbing  NEURO: alert & oriented x 3 with fluent speech, no focal motor/sensory deficits  Labs:  Lab Results  Component Value Date   WBC 5.1 05/30/2013   HGB 9.0* 05/30/2013   HCT 26.9* 05/30/2013   MCV 101.1* 05/30/2013   PLT 145 05/30/2013   NEUTROABS 3.1 05/30/2013      Chemistry      Component Value Date/Time   NA 142 05/30/2013 0939   NA 140 04/25/2012 0530   K 3.7 05/30/2013 0939   K 4.3 04/25/2012 0530   CL 108* 11/26/2012 1025   CL 110 04/25/2012 0530   CO2 15* 05/30/2013 0939   CO2 16* 04/25/2012 0530   BUN 32.9* 05/30/2013 0939   BUN 63* 04/25/2012 0530   CREATININE 2.4* 05/30/2013 0939   CREATININE 3.84* 04/25/2012 0530   CREATININE 1.55* 01/27/2012 0921      Component Value Date/Time   CALCIUM 8.7 05/30/2013 0939   CALCIUM 6.9* 04/25/2012 0530   ALKPHOS 133 05/30/2013 0939   ALKPHOS 70 04/22/2012 1456   AST 163* 05/30/2013 0939   AST 21 04/22/2012 1456   ALT 66* 05/30/2013 0939   ALT 23 04/22/2012 1456   BILITOT 1.15 05/30/2013 0939   BILITOT 0.6 04/22/2012  1456     GFR Estimated Creatinine Clearance: 20.7 ml/min (by C-G formula based on Cr of 2.4).  Studies:  No results found.   RADIOGRAPHIC STUDIES: No results found.  ASSESSMENT: Madison [name] 67 y.o. female with a history of Lung cancer, unspecified laterality - Plan:  heparin lock flush 100 unit/mL, sodium chloride 0.9 % injection 10 mL, CBC with Differential, Comprehensive metabolic panel  Anemia - Plan: CBC with Differential, Comprehensive metabolic panel  Renal insufficiency - Plan: CBC with Differential, Comprehensive metabolic panel  Elevated transaminase level  Fatty liver  Fibroadenoma of left breast  PLAN:  1. 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  --Clinically, she remains stable. CT chest from 03/27/13 did not show recurrence of disease. Patient is stable and has no symptoms, except neuropathy.   2. Neuropathy.  --She did not tolerate gabapentin and will notify us it persists or worsens.   3. Anemia.  --Probably secondary to chronic kidney disease.   4. L breast fibroadenoma --She had a core needle biopsy of her left breast on 07/13/2012. This showed a fibroadenoma with calcifications.  5. Elevated Transaminases. -- The etiology of the patient's elevated liver function tests is likely due to fatty infiltration, suggestive from the recent CT of chest done on 03/27/2013.  There was mild diffuse low attenuation within the liver suggestive of fatty infiltration.   We will continue to observe for now and advise weight lost.    6. CKDz. -- Followed by Dr. Lowell [name] in [place].   Creatinine stable.  Avoiding nephrotoxins.   7. Follow-up. --Port-A-Cath with heparin flushes every 2 months. We will have Mrs. Sprung return in 2 months at which time we will check CBC, chemistries, and CEA.    All questions were answered. The patient knows to call the clinic with any problems, questions or concerns. We can certainly see the patient much sooner if necessary.  I spent 15 minutes counseling the patient face to face. The total time spent in the appointment was 25 minutes.    Shondale Quinley, MD 05/31/2013 4:25 PM

## 2013-05-30 NOTE — Patient Instructions (Signed)
Chronic Kidney Disease Chronic kidney disease occurs when the kidneys are damaged over a long period. The kidneys are two organs that lie on either side of the spine between the middle of the back and the front of the abdomen. The kidneys:   Remove wastes and extra water from the blood.   Produce important hormones. These help keep bones strong, regulate blood pressure, and help create red blood cells.   Balance the fluids and chemicals in the blood and tissues. A small amount of kidney damage may not cause problems, but a large amount of damage may make it difficult or impossible for the kidneys to work the way they should. If steps are not taken to slow down the kidney damage or stop it from getting worse, the kidneys may stop working permanently. Most of the time, chronic kidney disease does not go away. However, it can often be controlled, and those with the disease can usually live normal lives. CAUSES  The most common causes of chronic kidney disease are diabetes and high blood pressure (hypertension). Chronic kidney disease may also be caused by:   Diseases that cause kidneys' filters to become inflamed.   Diseases that affect the immune system.   Genetic diseases.   Medicines that damage the kidneys, such as anti-inflammatory medicines.  Poisoning or exposure to toxic substances.   A reoccurring kidney or urinary infection.   A problem with urine flow. This may be caused by:   Cancer.   Kidney stones.   An enlarged prostate in males. SYMPTOMS  Because the kidney damage in chronic kidney disease occurs slowly, symptoms develop slowly and may not be obvious until the kidney damage becomes severe. A person may have a kidney disease for years without showing any symptoms. Symptoms can include:   Swelling (edema) of the legs, ankles, or feet.   Tiredness (lethargy).   Nausea or vomiting.   Confusion.   Problems with urination, such as:   Decreased urine  production.   Frequent urination, especially at night.   Frequent accidents in children who are potty trained.   Muscle twitches and cramps.   Shortness of breath.  Weakness.   Persistent itchiness.   Loss of appetite.  Metallic taste in the mouth.  Trouble sleeping.  Slowed development in children.  Short stature in children. DIAGNOSIS  Chronic kidney disease may be detected and diagnosed by tests, including blood, urine, imaging, or kidney biopsy tests.  TREATMENT  Most chronic kidney diseases cannot be cured. Treatment usually involves relieving symptoms and preventing or slowing the progression of the disease. Treatment may include:   A special diet. You may need to avoid alcohol and foods thatare salty and high in potassium.   Medicines. These may:   Lower blood pressure.   Relieve anemia.   Relieve swelling.   Protect the bones. HOME CARE INSTRUCTIONS   Follow your prescribed diet.   Only take over-the-counter or prescription medicines as directed by your caregiver.  Do not take any new medicines (prescription, over-the-counter, or nutritional supplements) unless approved by your caregiver. Many medicines can worsen your kidney damage or need to have the dose adjusted.   Quit smoking if you are a smoker. Talk to your caregiver about a smoking cessation program.   Keep all follow-up appointments as directed by your caregiver. SEEK IMMEDIATE MEDICAL CARE IF:  Your symptoms get worse or you develop new symptoms.   You develop symptoms of end-stage kidney disease. These include:   Headaches.  Abnormally dark or light skin.   Numbness in the hands or feet.   Easy bruising.   Frequent hiccups.   Menstruation stops.   You have a fever.   You have decreased urine production.   You havepain or bleeding when urinating. MAKE SURE YOU:  Understand these instructions.  Will watch your condition.  Will get help right  away if you are not doing well or get worse. FOR MORE INFORMATION  American Association of Kidney Patients: ResidentialShow.is National Kidney Foundation: www.kidney.org American Kidney Fund: FightingMatch.com.ee [facility]: www.lifeoptions.org and www.kidneyschool.org Document Released: 05/03/2008 Document Revised: 07/11/2012 Document Reviewed: 03/23/2012 [facility] Patient Information 2014 [place], [place]. Anemia, Frequently Asked Questions WHAT ARE THE SYMPTOMS OF ANEMIA?  Headache.  Difficulty thinking.  Fatigue.  Shortness of breath.  Weakness.  Rapid heartbeat. AT WHAT POINT ARE PEOPLE CONSIDERED ANEMIC?  This varies with gender and age.   Both hemoglobin (Hgb) and hematocrit values are used to define anemia. These lab values are obtained from a complete blood count (CBC) test. This is performed at a caregiver's office.  The normal range of hemoglobin values for adult men is 14.0 g/dL to 16.1 g/dL. For nonpregnant women, values are 12.3 g/dL to 09.6 g/dL.  The World Health Organization defines anemia as less than 12 g/dL for nonpregnant women and less than 13 g/dL for men.  For adult males, the average normal hematocrit is 46%, and the range is 40% to 52%.  For adult females, the average normal hematocrit is 41%, and the range is 35% to 47%.  Values that fall below the lower limits can be a sign of anemia and should have further checking (evaluation). GROUPS OF PEOPLE WHO ARE AT RISK FOR DEVELOPING ANEMIA INCLUDE:   Infants who are breastfed or taking a formula that is not fortified with iron.  Children going through a rapid growth spurt. The iron available can not keep up with the needs for a red cell mass which must grow with the child.  Women in childbearing years. They need iron because of blood loss during menstruation.  Pregnant women. The growing fetus creates a high demand for iron.  People with ongoing gastrointestinal blood loss are at risk of  developing iron deficiency.  Individuals with leukemia or cancer who must receive chemotherapy or radiation to treat their disease. The drugs or radiation used to treat these diseases often decreases the bone marrow's ability to make cells of all classes. This includes red blood cells, white blood cells, and platelets.  Individuals with chronic inflammatory conditions such as rheumatoid arthritis or chronic infections.  The elderly. ARE SOME TYPES OF ANEMIA INHERITED?   Yes, some types of anemia are due to inherited or genetic defects.  Sickle cell anemia. This occurs most often in people of African, African American, and Mediterranean descent.  Thalassemia (or Cooley's anemia). This type is found in people of Mediterranean and Southeast Asian descent. These types of anemia are common.  [place]. This is rare. CAN CERTAIN MEDICATIONS CAUSE A PERSON TO BECOME ANEMIC?  Yes. For example, drugs to fight cancer (chemotherapeutic agents) often cause anemia. These drugs can slow the bone marrow's ability to make red blood cells. If there are not enough red blood cells, the body does not get enough oxygen. WHAT HEMATOCRIT LEVEL IS REQUIRED TO DONATE BLOOD?  The lower limit of an acceptable hematocrit for blood donors is 38%. If you have a low hematocrit value, you should schedule an appointment with your caregiver. ARE  BLOOD TRANSFUSIONS COMMONLY USED TO CORRECT ANEMIA, AND ARE THEY DANGEROUS?  They are used to treat anemia as a last resort. Your caregiver will find the cause of the anemia and correct it if possible. Most blood transfusions are given because of excessive bleeding at the time of surgery, with trauma, or because of bone marrow suppression in patients with cancer or leukemia on chemotherapy. Blood transfusions are safer than ever before. We also know that blood transfusions affect the immune system and may increase certain risks. There is also a concern for human error. In 1/16,000  transfusions, a patient receives a transfusion of blood that is not matched with his or her blood type.  WHAT IS IRON DEFICIENCY ANEMIA AND CAN I CORRECT IT BY CHANGING MY DIET?  Iron is an essential part of hemoglobin. Without enough hemoglobin, anemia develops and the body does not get the right amount of oxygen. Iron deficiency anemia develops after the body has had a low level of iron for a long time. This is either caused by blood loss, not taking in or absorbing enough iron, or increased demands for iron (like pregnancy or rapid growth).  Foods from animal origin such as beef, chicken, and pork, are good sources of iron. Be sure to have one of these foods at each meal. Vitamin C helps your body absorb iron. Foods rich in Vitamin C include citrus, bell pepper, strawberries, spinach and cantaloupe. In some cases, iron supplements may be needed in order to correct the iron deficiency. In the case of poor absorption, extra iron may have to be given directly into the vein through a needle (intravenously). I HAVE BEEN DIAGNOSED WITH IRON DEFICIENCY ANEMIA AND MY CAREGIVER PRESCRIBED IRON SUPPLEMENTS. HOW LONG WILL IT TAKE FOR MY BLOOD TO BECOME NORMAL?  It depends on the degree of anemia at the beginning of treatment. Most people with mild to moderate iron deficiency, anemia will correct the anemia over a period of 2 to 3 months. But after the anemia is corrected, the iron stored by the body is still low. Caregivers often suggest an additional 6 months of oral iron therapy once the anemia has been reversed. This will help prevent the iron deficiency anemia from quickly happening again. Non-anemic adult males should take iron supplements only under the direction of a doctor, too much iron can cause liver damage.  MY HEMOGLOBIN IS 9 G/DL AND I AM SCHEDULED FOR SURGERY. SHOULD I POSTPONE THE SURGERY?  If you have Hgb of 9, you should discuss this with your caregiver right away. Many patients with similar  hemoglobin levels have had surgery without problems. If minimal blood loss is expected for a minor procedure, no treatment may be necessary.  If a greater blood loss is expected for more extensive procedures, you should ask your caregiver about being treated with erythropoietin and iron. This is to accelerate the recovery of your hemoglobin to a normal level before surgery. An anemic patient who undergoes high-blood-loss surgery has a greater risk of surgical complications and need for a blood transfusion, which also carries some risk.  I HAVE BEEN TOLD THAT HEAVY MENSTRUAL PERIODS CAUSE ANEMIA. IS THERE ANYTHING I CAN DO TO PREVENT THE ANEMIA?  Anemia that results from heavy periods is usually due to iron deficiency. You can try to meet the increased demands for iron caused by the heavy monthly blood loss by increasing the intake of iron-rich foods. Iron supplements may be required. Discuss your concerns with your caregiver. WHAT CAUSES  ANEMIA DURING PREGNANCY?  Pregnancy places major demands on the body. The mother must meet the needs of both her body and her growing baby. The body needs enough iron and folate to make the right amount of red blood cells. To prevent anemia while pregnant, the mother should stay in close contact with her caregiver.  Be sure to eat a diet that has foods rich in iron and folate like liver and dark green leafy vegetables. Folate plays an important role in the normal development of a baby's spinal cord. Folate can help prevent serious disorders like spina bifida. If your diet does not provide adequate nutrients, you may want to talk with your caregiver about nutritional supplements.  WHAT IS THE RELATIONSHIP BETWEEN FIBROID TUMORS AND ANEMIA IN WOMEN?  The relationship is usually caused by the increased menstrual blood loss caused by fibroids. Good iron intake may be required to prevent iron deficiency anemia from developing.  Document Released: 03/02/2004 Document Revised:  10/17/2011 Document Reviewed: 08/17/2010 [facility] Patient Information 2014 [place], [place].

## 2013-05-31 DIAGNOSIS — D242 Benign neoplasm of left breast: Secondary | ICD-10-CM | POA: Insufficient documentation

## 2013-05-31 DIAGNOSIS — K760 Fatty (change of) liver, not elsewhere classified: Secondary | ICD-10-CM | POA: Insufficient documentation

## 2013-06-04 ENCOUNTER — Other Ambulatory Visit: Payer: Self-pay

## 2013-06-04 MED ORDER — DILTIAZEM HCL ER COATED BEADS 180 MG PO CP24: 180.0000 mg | ORAL_CAPSULE | Freq: Every day | ORAL | Status: DC

## 2013-06-13 ENCOUNTER — Other Ambulatory Visit: Payer: Self-pay

## 2013-06-18 ENCOUNTER — Telehealth: Payer: Self-pay | Admitting: Internal Medicine

## 2013-06-18 NOTE — Telephone Encounter (Signed)
returned pt call and had flush with 10.23 does not want to come back b4 next visit

## 2013-06-20 ENCOUNTER — Telehealth: Payer: Self-pay | Admitting: Internal Medicine

## 2013-06-20 NOTE — Telephone Encounter (Signed)
sw pt adv of rs of 12/22 appts to 12/31 bc of Dr Charlesetta Garibaldi schedule change Email cal to pt shh

## 2013-07-23 ENCOUNTER — Ambulatory Visit: Payer: Medicare Other

## 2013-07-26 ENCOUNTER — Ambulatory Visit: Payer: Medicare Other | Admitting: Cardiovascular Disease

## 2013-07-30 ENCOUNTER — Ambulatory Visit: Payer: Medicare Other

## 2013-07-30 ENCOUNTER — Other Ambulatory Visit: Payer: Medicare Other | Admitting: Lab

## 2013-08-05 ENCOUNTER — Other Ambulatory Visit: Payer: Medicare Other | Admitting: Lab

## 2013-08-06 ENCOUNTER — Other Ambulatory Visit: Payer: Medicare Other | Admitting: Lab

## 2013-08-07 ENCOUNTER — Telehealth: Payer: Self-pay | Admitting: Physician Assistant

## 2013-08-07 ENCOUNTER — Other Ambulatory Visit (HOSPITAL_BASED_OUTPATIENT_CLINIC_OR_DEPARTMENT_OTHER): Payer: Medicare Other

## 2013-08-07 ENCOUNTER — Ambulatory Visit (HOSPITAL_BASED_OUTPATIENT_CLINIC_OR_DEPARTMENT_OTHER): Payer: Medicare Other

## 2013-08-07 ENCOUNTER — Ambulatory Visit (HOSPITAL_BASED_OUTPATIENT_CLINIC_OR_DEPARTMENT_OTHER): Payer: Medicare Other | Admitting: Physician Assistant

## 2013-08-07 ENCOUNTER — Encounter: Payer: Self-pay | Admitting: Physician Assistant

## 2013-08-07 ENCOUNTER — Other Ambulatory Visit: Payer: Self-pay | Admitting: Medical Oncology

## 2013-08-07 ENCOUNTER — Ambulatory Visit: Payer: Medicare Other

## 2013-08-07 DIAGNOSIS — R300 Dysuria: Secondary | ICD-10-CM

## 2013-08-07 DIAGNOSIS — G609 Hereditary and idiopathic neuropathy, unspecified: Secondary | ICD-10-CM

## 2013-08-07 DIAGNOSIS — D649 Anemia, unspecified: Secondary | ICD-10-CM

## 2013-08-07 DIAGNOSIS — N189 Chronic kidney disease, unspecified: Secondary | ICD-10-CM

## 2013-08-07 DIAGNOSIS — C3410 Malignant neoplasm of upper lobe, unspecified bronchus or lung: Secondary | ICD-10-CM

## 2013-08-07 DIAGNOSIS — R350 Frequency of micturition: Secondary | ICD-10-CM | POA: Insufficient documentation

## 2013-08-07 DIAGNOSIS — Z452 Encounter for adjustment and management of vascular access device: Secondary | ICD-10-CM

## 2013-08-07 DIAGNOSIS — N289 Disorder of kidney and ureter, unspecified: Secondary | ICD-10-CM

## 2013-08-07 LAB — COMPREHENSIVE METABOLIC PANEL (CC13)
ALT: 43 U/L (ref 0–55)
AST: 105 U/L — ABNORMAL HIGH (ref 5–34)
Albumin: 2.6 g/dL — ABNORMAL LOW (ref 3.5–5.0)
CO2: 14 mEq/L — ABNORMAL LOW (ref 22–29)
Calcium: 7.6 mg/dL — ABNORMAL LOW (ref 8.4–10.4)
Chloride: 109 mEq/L (ref 98–109)
Potassium: 3.8 mEq/L (ref 3.5–5.1)
Sodium: 138 mEq/L (ref 136–145)

## 2013-08-07 LAB — CBC WITH DIFFERENTIAL/PLATELET
BASO%: 0.4 % (ref 0.0–2.0)
Basophils Absolute: 0 10*3/uL (ref 0.0–0.1)
EOS%: 0.5 % (ref 0.0–7.0)
HGB: 8.7 g/dL — ABNORMAL LOW (ref 11.6–15.9)
MCH: 35.4 pg — ABNORMAL HIGH (ref 25.1–34.0)
MCHC: 33.5 g/dL (ref 31.5–36.0)
MONO#: 1.1 10*3/uL — ABNORMAL HIGH (ref 0.1–0.9)
RDW: 16.6 % — ABNORMAL HIGH (ref 11.2–14.5)
WBC: 6.9 10*3/uL (ref 3.9–10.3)
lymph#: 1.5 10*3/uL (ref 0.9–3.3)

## 2013-08-07 LAB — URINALYSIS, MICROSCOPIC - CHCC
Nitrite: NEGATIVE
Protein: 30 mg/dL
Urobilinogen, UR: 0.2 mg/dL (ref 0.2–1)
pH: 6 (ref 4.6–8.0)

## 2013-08-07 MED ORDER — HEPARIN SOD (PORK) LOCK FLUSH 100 UNIT/ML IV SOLN
500.0000 [IU] | Freq: Once | INTRAVENOUS | Status: AC
  Administered 2013-08-07: 500 [IU] via INTRAVENOUS
  Filled 2013-08-07: qty 5

## 2013-08-07 MED ORDER — CIPROFLOXACIN HCL 500 MG PO TABS: 500.0000 mg | ORAL_TABLET | Freq: Two times a day (BID) | ORAL | Status: DC

## 2013-08-07 MED ORDER — SODIUM CHLORIDE 0.9 % IJ SOLN
10.0000 mL | INTRAMUSCULAR | Status: DC | PRN
  Administered 2013-08-07: 10 mL via INTRAVENOUS
  Filled 2013-08-07: qty 10

## 2013-08-07 NOTE — Telephone Encounter (Signed)
appts made per 12/31 POF AVS and CAL given shh °

## 2013-08-07 NOTE — Patient Instructions (Signed)
Take the antibiotics as prescribed for your urinary symptoms Followup with Dr. Baltazar Apo in 2 months with repeat labs as well as a Port-A-Cath flush

## 2013-08-07 NOTE — Progress Notes (Signed)
[facility] OFFICE PROGRESS NOTE  Madison Showers, MD [address] [place] [address] [place] [address]  DIAGNOSIS: Lung cancer, unspecified laterality - Plan: CBC with Differential, Comprehensive metabolic panel (Cmet) - [facility]  Urinary frequency - Plan: ciprofloxacin (CIPRO) 500 MG tablet  No chief complaint on file.   CURRENT THERAPY:  INTERVAL HISTORY: [name] 67 y.o. 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 She is accompanied by her husband Madison [name].  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  She is followed by Kidney specialist (Dr. [name]) regularly.  She gets around with a  walker and at times a cane.  She is scheduled for a mammogram this December.  Her last diagnostic mammogram demonstrated left breast calcification with biopsy demonstrating fibroadenoma with calcifications.   Today she complains of urinary frequency and burning for the past several days. She reports that she has had urinary incontinence for approximately a year and uses pads at night as result. She is wondering if her current symptoms are related to the use of a hepatic night. She continues to have neuropathy in her hands and feet. She is able to button and Cipro) right her name however she says it "takes a while". She had her Port-A-Cath flushed today. She voiced no other specific complaints. She is a former smoker and states that she quit the day after she was diagnosed with lung cancer. The patient denied fever, chills, night sweats, change in weight. She denied headaches, double vision, blurry vision, nasal congestion, hearing problems, odynophagia or dysphagia. No chest pain, palpitations, dyspnea, cough, abdominal pain, nausea, vomiting, diarrhea, constipation, hematochezia. The patient denied myalgias or psychiatric problems.  MEDICAL HISTORY: Past Medical History  Diagnosis Date  .  Hypertension   . Anemia   . Atrial fibrillation   . Chronic kidney disease   . lung ca     lung ca  . Lung cancer 11/11/10    INTERIM HISTORY: has Lung cancer; Anemia; Renal insufficiency; Atrial fibrillation; ARF (acute renal failure); Diarrhea; SIRS (systemic inflammatory response syndrome); Metabolic acidosis; LV dysfunction; Dehydration; Breast mass, left; Elevated transaminase level; Fatty liver; Fibroadenoma of left breast; and Urinary frequency on her problem list.    ALLERGIES:  is allergic to lisinopril.  MEDICATIONS: has a current medication list which includes the following prescription(s): b complex-biotin-fa, cholecalciferol, diltiazem, lidocaine-prilocaine, loratadine, multivitamin with minerals, probiotic product, and ciprofloxacin.  SURGICAL HISTORY:  Past Surgical History  Procedure Laterality Date  . Port a cath placement     PROBLEM LIST.:  1. 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 The patient had a right pleural effusion at that time that was cytologically negative. She received radiation treatments under the direction of Dr. Margaretmary Dys, 6600 cGy in 33 fractions from January 27, 2011 through March 15, 2011, in combination with weekly carboplatin and Taxotere. She was started on consolidative treatment with reduced doses of carboplatin, gemcitabine, and Neulasta on May 12, 2011. She has had significant thrombocytopenia not requiring transfusions. Patient completed 4 cycles of consolidative chemotherapy, completed on 07/14/11.  Neulasta was omitted from cycle 4. CT scan of the chest without IV contrast carried out on 10/02/2012 showed no evidence of recurrent lung cancer.  2. Atrial fibrillation.  3. Peripheral neuropathy of feet and hands since 1998.  4. Hypertension.  5. Incontinence of urine.  6. History of shock, diarrhea, metabolic acidosis, and renal failure, requiring admission to  the ICU in April 2012.  7. History of left foot drop.  8. Anemia.  9. Renal insufficiency.  10. Admission to the hospital from 04/18/2012 through 04/25/2012 with diarrhea, dehydration, worsening of renal insufficiency secondary to Clostridium difficile colitis. The patient is about to finish up her oral vancomycin. There was no history of prior antibiotic exposure.  11. Anemia with unexplained drop in the patient's hemoglobin from 8.2 on 04/25/2012 to 5.6 on 05/01/2012. The patient received 2 units of packed red cells. Stool cards on 05/08/2012 were negative for occult blood. In part, the anemia may be secondary to the patient's underlying renal insufficiency.  12. Bilateral screening mammogram from 05/04/2012 showed calcifications involving the left breast. A core needle biopsy carried out on the left breast on 07/13/2012 showed a fibroadenoma with associated calcifications. No atypia or malignancy was identified.  13. Right port-a-cath placed 01/20/11  REVIEW OF SYSTEMS:   Constitutional: Denies fevers, chills or abnormal weight loss Eyes: Denies blurriness of vision Ears, nose, mouth, throat, and face: Denies mucositis or sore throat Respiratory: Denies cough, dyspnea or wheezes Cardiovascular: Denies palpitation, chest discomfort or lower extremity swelling Gastrointestinal:  Denies nausea, heartburn or change in bowel habits Skin: Denies abnormal skin rashes Lymphatics: Denies new lymphadenopathy or easy bruising Neurological:Denies numbness, tingling of the hands but reports tingling in the feet that is longstanding but denies new weaknesses Behavioral/Psych: Mood is stable, no new changes  All other systems were reviewed with the patient and are negative.  PHYSICAL EXAMINATION: ECOG PERFORMANCE STATUS: 2 - Symptomatic, <50% confined to bed  Blood pressure 119/66, pulse 107, temperature 98.1 F (36.7 C), temperature source Oral, resp. rate 18, height 5\' 6"  (1.676 m), weight 139 lb 6.4  oz (63.231 kg), SpO2 100.00%.  GENERAL:alert, no distress and comfortable SKIN: skin color, texture, turgor are normal, no rashes or significant lesions; R port a cath- non tender, without evidence of infection EYES: normal, Conjunctiva are pink and non-injected, sclera clear OROPHARYNX:no exudate, no erythema and lips, buccal mucosa, and tongue normal  NECK: supple, thyroid normal size, non-tender, without nodularity LYMPH:  no palpable lymphadenopathy in the cervical, axillary or supraclavicular LUNGS: clear to auscultation and percussion with normal breathing effort HEART: regular rate & rhythm and no murmurs and puffy  ankles, left greater than right, without pitting.  ABDOMEN:abdomen soft, non-tender and normal bowel sounds, no suprapubic or CVA tenderness Musculoskeletal:no cyanosis of digits and no clubbing  NEURO: alert & oriented x 3 with fluent speech, no focal motor/sensory deficits  Labs:  Lab Results  Component Value Date   WBC 6.9 08/07/2013   HGB 8.7* 08/07/2013   HCT 26.0* 08/07/2013   MCV 105.4* 08/07/2013   PLT 185 08/07/2013   NEUTROABS 4.3 08/07/2013      Chemistry      Component Value Date/Time   NA 138 08/07/2013 0901   NA 140 04/25/2012 0530   K 3.8 08/07/2013 0901   K 4.3 04/25/2012 0530   CL 108* 11/26/2012 1025   CL 110 04/25/2012 0530   CO2 14* 08/07/2013 0901   CO2 16* 04/25/2012 0530   BUN 23.8 08/07/2013 0901   BUN 63* 04/25/2012 0530   CREATININE 2.1* 08/07/2013 0901   CREATININE 3.84* 04/25/2012 0530   CREATININE 1.55* 01/27/2012 0921      Component Value Date/Time   CALCIUM 7.6* 08/07/2013 0901   CALCIUM 6.9* 04/25/2012 0530   ALKPHOS 141  08/07/2013 0901   ALKPHOS 70 04/22/2012 1456   AST 105* 08/07/2013 0901   AST 21 04/22/2012 1456   ALT 43 08/07/2013 0901   ALT 23 04/22/2012 1456   BILITOT 0.87 08/07/2013 0901   BILITOT 0.6 04/22/2012 1456     GFR Estimated Creatinine Clearance: 23.7 ml/min (by C-G formula based on Cr of  2.1).  Urinalysis from today reveals too numerous to count white blood cells, moderate bacteria as well as a small amount of blood. Urine culture and sensitivity are pending  Studies:  No results found.   RADIOGRAPHIC STUDIES: No results found.  ASSESSMENT: Madison [name] 67 y.o. female with a history of Lung cancer, unspecified laterality - Plan: CBC with Differential, Comprehensive metabolic panel (Cmet) - [facility]  Urinary frequency - Plan: ciprofloxacin (CIPRO) 500 MG tablet  Patient discussed with Dr. Rosie Fate  PLAN:  1. 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  --Clinically, she remains stable. CT chest from 03/27/13 did not show recurrence of disease. Patient is stable and has no symptoms, except neuropathy.   2. Neuropathy.  --She did not tolerate gabapentin and will notify us it persists or worsens.   3. Anemia.  --Probably secondary to chronic kidney disease.   4. L breast fibroadenoma --She had a core needle biopsy of her left breast on 07/13/2012. This showed a fibroadenoma with calcifications.  5. Elevated Transaminases. -- The etiology of the patient's elevated liver function tests is likely due to fatty infiltration, suggestive from the recent CT of chest done on 03/27/2013.  There was mild diffuse low attenuation within the liver suggestive of fatty infiltration.   We will continue to observe for now and advise weight lost.    6. CKDz. -- Followed by Dr. [name] in [place].   Creatinine stable.  Avoiding nephrotoxins.   7. dysuria and urinary frequency-the urinalysis shows evidence for urinary tract infection. We will start the patient on a seven-day course of Cipro at 500 mg by mouth twice daily. Should this culture is sensitivity indicate a different antibiotic is necessary we will make the appropriate change.  8. Follow-up. --Port-A-Cath with heparin flushes every 2 months. We will have Mrs. Gasca follow up with Dr. Rosie Fate in 2 months at which  time we will check CBC, chemistries.    All questions were answered. The patient knows to call the clinic with any problems, questions or concerns. We can certainly see the patient much sooner if necessary.  I spent 20 minutes counseling the patient face to face. The total time spent in the appointment was 30 minutes.    Conni Slipper, PA-C 08/07/2013 2:27 PM

## 2013-08-07 NOTE — Patient Instructions (Signed)
Implanted Port Instructions  An implanted port is a central line that has a round shape and is placed under the skin. It is used for long-term IV (intravenous) access for:  · Medicine.  · Fluids.  · Liquid nutrition, such as TPN (total parenteral nutrition).  · Blood samples.  Ports can be placed:  · In the chest area just below the collarbone (this is the most common place.)  · In the arms.  · In the belly (abdomen) area.  · In the legs.  PARTS OF THE PORT  A port has 2 main parts:  · The reservoir. The reservoir is round, disc-shaped, and will be a small, raised area under your skin.  · The reservoir is the part where a needle is inserted (accessed) to either give medicines or to draw blood.  · The catheter. The catheter is a long, slender tube that extends from the reservoir. The catheter is placed into a large vein.  · Medicine that is inserted into the reservoir goes into the catheter and then into the vein.  INSERTION OF THE PORT  · The port is surgically placed in either an operating room or in a procedural area (interventional radiology).  · Medicine may be given to help you relax during the procedure.  · The skin where the port will be inserted is numbed (local anesthetic).  · 1 or 2 small cuts (incisions) will be made in the skin to insert the port.  · The port can be used after it has been inserted.  INCISION SITE CARE  · The incision site may have small adhesive strips on it. This helps keep the incision site closed. Sometimes, no adhesive strips are placed. Instead of adhesive strips, a special kind of surgical glue is used to keep the incision closed.  · If adhesive strips were placed on the incision sites, do not take them off. They will fall off on their own.  · The incision site may be sore for 1 to 2 days. Pain medicine can help.  · Do not get the incision site wet. Bathe or shower as directed by your caregiver.  · The incision site should heal in 5 to 7 days. A small scar may form after the  incision has healed.  ACCESSING THE PORT  Special steps must be taken to access the port:  · Before the port is accessed, a numbing cream can be placed on the skin. This helps numb the skin over the port site.  · A sterile technique is used to access the port.  · The port is accessed with a needle. Only "non-coring" port needles should be used to access the port. Once the port is accessed, a blood return should be checked. This helps ensure the port is in the vein and is not clogged (clotted).  · If your caregiver believes your port should remain accessed, a clear (transparent) bandage will be placed over the needle site. The bandage and needle will need to be changed every week or as directed by your caregiver.  · Keep the bandage covering the needle clean and dry. Do not get it wet. Follow your caregiver's instructions on how to take a shower or bath when the port is accessed.  · If your port does not need to stay accessed, no bandage is needed over the port.  FLUSHING THE PORT  Flushing the port keeps it from getting clogged. How often the port is flushed depends on:  · If a   constant infusion is running. If a constant infusion is running, the port may not need to be flushed.  · If intermittent medicines are given.  · If the port is not being used.  For intermittent medicines:  · The port will need to be flushed:  · After medicines have been given.  · After blood has been drawn.  · As part of routine maintenance.  · A port is normally flushed with:  · Normal saline.  · Heparin.  · Follow your caregiver's advice on how often, how much, and the type of flush to use on your port.  IMPORTANT PORT INFORMATION  · Tell your caregiver if you are allergic to heparin.  · After your port is placed, you will get a manufacturer's information card. The card has information about your port. Keep this card with you at all times.  · There are many types of ports available. Know what kind of port you have.  · In case of an  emergency, it may be helpful to wear a medical alert bracelet. This can help alert health care workers that you have a port.  · The port can stay in for as long as your caregiver believes it is necessary.  · When it is time for the port to come out, surgery will be done to remove it. The surgery will be similar to how the port was put in.  · If you are in the hospital or clinic:  · Your port will be taken care of and flushed by a nurse.  · If you are at home:  · A home health care nurse may give medicines and take care of the port.  · You or a family member can get special training and directions for giving medicine and taking care of the port at home.  SEEK IMMEDIATE MEDICAL CARE IF:   · Your port does not flush or you are unable to get a blood return.  · New drainage or pus is coming from the incision.  · A bad smell is coming from the incision site.  · You develop swelling or increased redness at the incision site.  · You develop increased swelling or pain at the port site.  · You develop swelling or pain in the surrounding skin near the port.  · You have an oral temperature above 102° F (38.9° C), not controlled by medicine.  MAKE SURE YOU:   · Understand these instructions.  · Will watch your condition.  · Will get help right away if you are not doing well or get worse.  Document Released: 07/25/2005 Document Revised: 10/17/2011 Document Reviewed: 10/16/2008  [employer]® Patient Information ©2014 [employer].

## 2013-08-08 LAB — URINE CULTURE

## 2013-08-14 ENCOUNTER — Telehealth: Payer: Self-pay

## 2013-08-14 NOTE — Telephone Encounter (Signed)
Pt c/o cough, no sputum, no fever, wee bit of post nasal drip. S/w Dr Juliann Mule and he agreed with OTC cough suppressant. I educated pt if any sputum or color in sputum or fever to go to PCP. She thanked me for information.

## 2013-09-05 ENCOUNTER — Ambulatory Visit: Payer: Medicare Other | Admitting: Cardiovascular Disease

## 2013-09-09 ENCOUNTER — Other Ambulatory Visit: Payer: Self-pay | Admitting: Cardiovascular Disease

## 2013-09-11 ENCOUNTER — Other Ambulatory Visit: Payer: Self-pay | Admitting: *Deleted

## 2013-09-11 MED ORDER — DILTIAZEM HCL ER COATED BEADS 180 MG PO CP24: 180.0000 mg | ORAL_CAPSULE | Freq: Every day | ORAL | Status: DC

## 2013-10-02 ENCOUNTER — Other Ambulatory Visit: Payer: Self-pay | Admitting: Medical Oncology

## 2013-10-02 ENCOUNTER — Other Ambulatory Visit: Payer: Medicare Other

## 2013-10-02 ENCOUNTER — Ambulatory Visit: Payer: Medicare Other

## 2013-10-03 ENCOUNTER — Telehealth: Payer: Self-pay | Admitting: Internal Medicine

## 2013-10-03 NOTE — Telephone Encounter (Signed)
, °

## 2013-10-08 ENCOUNTER — Telehealth: Payer: Self-pay | Admitting: Cardiovascular Disease

## 2013-10-08 MED ORDER — DILTIAZEM HCL ER COATED BEADS 180 MG PO CP24: 180.0000 mg | ORAL_CAPSULE | Freq: Every day | ORAL | Status: DC

## 2013-10-08 NOTE — Addendum Note (Signed)
Addended by: Jonathon Jordan on: 10/08/2013 11:26 AM   Modules accepted: Orders

## 2013-10-08 NOTE — Telephone Encounter (Signed)
App made/ refilled med to get her to app.

## 2013-10-08 NOTE — Telephone Encounter (Signed)
New message     Talk to a nurse about her medications----pt does not want to fill them until she talks to the nurse

## 2013-10-15 ENCOUNTER — Encounter: Payer: Self-pay | Admitting: Internal Medicine

## 2013-10-15 ENCOUNTER — Other Ambulatory Visit: Payer: Medicare Other

## 2013-10-15 ENCOUNTER — Telehealth: Payer: Self-pay | Admitting: Internal Medicine

## 2013-10-15 ENCOUNTER — Ambulatory Visit (HOSPITAL_BASED_OUTPATIENT_CLINIC_OR_DEPARTMENT_OTHER): Payer: Medicare Other

## 2013-10-15 ENCOUNTER — Ambulatory Visit (HOSPITAL_BASED_OUTPATIENT_CLINIC_OR_DEPARTMENT_OTHER): Payer: Medicare Other | Admitting: Internal Medicine

## 2013-10-15 ENCOUNTER — Other Ambulatory Visit (HOSPITAL_BASED_OUTPATIENT_CLINIC_OR_DEPARTMENT_OTHER): Payer: Medicare Other

## 2013-10-15 VITALS — BP 160/93 | HR 117 | Temp 97.8°F | Resp 18 | Ht 66.0 in | Wt 143.0 lb

## 2013-10-15 DIAGNOSIS — C3410 Malignant neoplasm of upper lobe, unspecified bronchus or lung: Secondary | ICD-10-CM

## 2013-10-15 DIAGNOSIS — G609 Hereditary and idiopathic neuropathy, unspecified: Secondary | ICD-10-CM

## 2013-10-15 DIAGNOSIS — D649 Anemia, unspecified: Secondary | ICD-10-CM

## 2013-10-15 DIAGNOSIS — Z95828 Presence of other vascular implants and grafts: Secondary | ICD-10-CM

## 2013-10-15 DIAGNOSIS — N179 Acute kidney failure, unspecified: Secondary | ICD-10-CM

## 2013-10-15 DIAGNOSIS — N189 Chronic kidney disease, unspecified: Secondary | ICD-10-CM

## 2013-10-15 DIAGNOSIS — R945 Abnormal results of liver function studies: Secondary | ICD-10-CM

## 2013-10-15 DIAGNOSIS — C3490 Malignant neoplasm of unspecified part of unspecified bronchus or lung: Secondary | ICD-10-CM

## 2013-10-15 DIAGNOSIS — Z452 Encounter for adjustment and management of vascular access device: Secondary | ICD-10-CM

## 2013-10-15 LAB — COMPREHENSIVE METABOLIC PANEL (CC13)
ALT: 55 U/L (ref 0–55)
ANION GAP: 15 meq/L — AB (ref 3–11)
AST: 136 U/L — ABNORMAL HIGH (ref 5–34)
Albumin: 3.1 g/dL — ABNORMAL LOW (ref 3.5–5.0)
Alkaline Phosphatase: 134 U/L (ref 40–150)
BILIRUBIN TOTAL: 0.54 mg/dL (ref 0.20–1.20)
BUN: 18.1 mg/dL (ref 7.0–26.0)
CO2: 17 meq/L — AB (ref 22–29)
Calcium: 8.9 mg/dL (ref 8.4–10.4)
Chloride: 109 mEq/L (ref 98–109)
Creatinine: 1.8 mg/dL — ABNORMAL HIGH (ref 0.6–1.1)
GLUCOSE: 82 mg/dL (ref 70–140)
Potassium: 4.2 mEq/L (ref 3.5–5.1)
Sodium: 141 mEq/L (ref 136–145)
TOTAL PROTEIN: 6.9 g/dL (ref 6.4–8.3)

## 2013-10-15 LAB — CBC WITH DIFFERENTIAL/PLATELET
BASO%: 1.2 % (ref 0.0–2.0)
BASOS ABS: 0 10*3/uL (ref 0.0–0.1)
EOS%: 1.2 % (ref 0.0–7.0)
Eosinophils Absolute: 0 10*3/uL (ref 0.0–0.5)
HCT: 29.4 % — ABNORMAL LOW (ref 34.8–46.6)
HEMOGLOBIN: 9.7 g/dL — AB (ref 11.6–15.9)
LYMPH%: 31.7 % (ref 14.0–49.7)
MCH: 35.2 pg — ABNORMAL HIGH (ref 25.1–34.0)
MCHC: 33.2 g/dL (ref 31.5–36.0)
MCV: 106.2 fL — ABNORMAL HIGH (ref 79.5–101.0)
MONO#: 0.5 10*3/uL (ref 0.1–0.9)
MONO%: 14.9 % — ABNORMAL HIGH (ref 0.0–14.0)
NEUT#: 1.8 10*3/uL (ref 1.5–6.5)
NEUT%: 51 % (ref 38.4–76.8)
Platelets: 136 10*3/uL — ABNORMAL LOW (ref 145–400)
RBC: 2.77 10*6/uL — ABNORMAL LOW (ref 3.70–5.45)
RDW: 15.4 % — AB (ref 11.2–14.5)
WBC: 3.5 10*3/uL — AB (ref 3.9–10.3)
lymph#: 1.1 10*3/uL (ref 0.9–3.3)

## 2013-10-15 MED ORDER — HEPARIN SOD (PORK) LOCK FLUSH 100 UNIT/ML IV SOLN
500.0000 [IU] | Freq: Once | INTRAVENOUS | Status: AC
  Administered 2013-10-15: 500 [IU] via INTRAVENOUS
  Filled 2013-10-15: qty 5

## 2013-10-15 MED ORDER — SODIUM CHLORIDE 0.9 % IJ SOLN
10.0000 mL | INTRAMUSCULAR | Status: DC | PRN
  Administered 2013-10-15: 10 mL via INTRAVENOUS
  Filled 2013-10-15: qty 10

## 2013-10-15 NOTE — Patient Instructions (Signed)

## 2013-10-15 NOTE — Progress Notes (Signed)
[place], MD [address] [place] [address]  DIAGNOSIS: Lung cancer - Plan: CBC with Differential, Comprehensive metabolic panel (Cmet) - [facility], Lactate dehydrogenase (LDH) - [facility], CEA, CT Chest Wo Contrast  ARF (acute renal failure) - Plan: Comprehensive metabolic panel (Cmet) - [facility]  Anemia - Plan: CBC with Differential  Chief Complaint  Patient presents with  . Lung cancer, unspecified laterality    CURRENT THERAPY: Observation.   INTERVAL HISTORY: Madison [name] 68 y.o. 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 She is accompanied by her husband Francee Piccolo.  She was last seen on 08/07/2013 by Carlton Adam.   She denies recent hospitalizations or emergency room visit.  She is followed by Kidney specialist (Dr. Florene Glen) regularly.  She gets around with a walker and at times a cane.  She is scheduled for a mammogram this December.  Her last diagnostic mammogram demonstrated left breast calcification with biopsy demonstrating fibroadenoma with calcifications.    The patient denied fever, chills, night sweats, change in weight. She denied headaches, double vision, blurry vision, nasal congestion, hearing problems, odynophagia or dysphagia. No chest pain, palpitations, dyspnea, cough, abdominal pain, nausea, vomiting, diarrhea, constipation, hematochezia. The patient denied dysuria, nocturia, polyuria, hematuria, myalgia, psychiatric problems.  MEDICAL HISTORY: Past Medical History  Diagnosis Date  . Hypertension   . Anemia   . Atrial fibrillation   . Chronic kidney disease   . lung ca     lung ca  . Lung cancer 11/11/10    INTERIM HISTORY: has Lung cancer; Anemia; Renal insufficiency; Atrial fibrillation; ARF (acute renal failure); Diarrhea; SIRS (systemic inflammatory response syndrome); Metabolic acidosis; LV  dysfunction; Dehydration; Breast mass, left; Elevated transaminase level; Fatty liver; Fibroadenoma of left breast; and Urinary frequency on her problem list.    ALLERGIES:  is allergic to lisinopril.  MEDICATIONS: has a current medication list which includes the following prescription(s): b complex-biotin-fa, cholecalciferol, diltiazem, lidocaine-prilocaine, loratadine, multivitamin with minerals, and probiotic product.  SURGICAL HISTORY:  Past Surgical History  Procedure Laterality Date  . Port a cath placement     PROBLEM LIST.:  1. 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 The patient had a right pleural effusion at that time that was cytologically negative. She received radiation treatments under the direction of Dr. Tyler Pita, 6600 cGy in 33 fractions from January 27, 2011 through March 15, 2011, in combination with weekly carboplatin and Taxotere. She was started on consolidative treatment with reduced doses of carboplatin, gemcitabine, and Neulasta on May 12, 2011. She has had significant thrombocytopenia not requiring transfusions. Patient completed 4 cycles of consolidative chemotherapy, completed on 07/14/11.  Neulasta was omitted from cycle 4. CT scan of the chest without IV contrast carried out on 10/02/2012 showed no evidence of recurrent lung cancer.  2. Atrial fibrillation.  3. Peripheral neuropathy of feet and hands since 1998.  4. Hypertension.  5. Incontinence of urine.  6. History of shock, diarrhea, metabolic acidosis, and renal failure, requiring admission to the ICU in April 2012.  7. History of left foot drop.  8. Anemia.  9. Renal insufficiency.  10. Admission to the hospital from 04/18/2012 through 04/25/2012 with diarrhea, dehydration, worsening of renal insufficiency secondary to Clostridium difficile colitis. The patient is about to finish up her oral vancomycin. There  was no history  of prior antibiotic exposure.  11. Anemia with unexplained drop in the patient's hemoglobin from 8.2 on 04/25/2012 to 5.6 on 05/01/2012. The patient received 2 units of packed red cells. Stool cards on 05/08/2012 were negative for occult blood. In part, the anemia may be secondary to the patient's underlying renal insufficiency.  12. Bilateral screening mammogram from 05/04/2012 showed calcifications involving the left breast. A core needle biopsy carried out on the left breast on 07/13/2012 showed a fibroadenoma with associated calcifications. No atypia or malignancy was identified.  13. Right port-a-cath placed 01/20/11  REVIEW OF SYSTEMS:   Constitutional: Denies fevers, chills or abnormal weight loss Eyes: Denies blurriness of vision Ears, nose, mouth, throat, and face: Denies mucositis or sore throat Respiratory: Denies cough, dyspnea or wheezes Cardiovascular: Denies palpitation, chest discomfort or lower extremity swelling Gastrointestinal:  Denies nausea, heartburn or change in bowel habits Skin: Denies abnormal skin rashes Lymphatics: Denies new lymphadenopathy or easy bruising Neurological: Reports numbness, tingling of the feet that is longstanding but denies new weaknesses Behavioral/Psych: Mood is stable, no new changes  All other systems were reviewed with the patient and are negative.  PHYSICAL EXAMINATION: ECOG PERFORMANCE STATUS: 2 - Symptomatic, <50% confined to bed  Blood pressure 160/93, pulse 117, temperature 97.8 F (36.6 C), temperature source Oral, resp. rate 18, height 5\' 6"  (1.676 m), weight 143 lb (64.864 kg), SpO2 100.00%.  GENERAL:alert, no distress and comfortable SKIN: skin color, texture, turgor are normal, no rashes or significant lesions; R port a cath EYES: normal, Conjunctiva are pink and non-injected, sclera clear OROPHARYNX:no exudate, no erythema and lips, buccal mucosa, and tongue normal  NECK: supple, thyroid normal size, non-tender, without  nodularity LYMPH:  no palpable lymphadenopathy in the cervical, axillary or supraclavicular LUNGS: clear to auscultation and percussion with normal breathing effort HEART: regular rate & rhythm and no murmurs and puffy  ankles, left greater than right, without pitting.  ABDOMEN:abdomen soft, non-tender and normal bowel sounds Musculoskeletal:no cyanosis of digits and no clubbing  NEURO: alert & oriented x 3 with fluent speech, no focal motor/sensory deficits  Labs:  Lab Results  Component Value Date   WBC 3.5* 10/15/2013   HGB 9.7* 10/15/2013   HCT 29.4* 10/15/2013   MCV 106.2* 10/15/2013   PLT 136* 10/15/2013   NEUTROABS 1.8 10/15/2013      Chemistry      Component Value Date/Time   NA 141 10/15/2013 0853   NA 140 04/25/2012 0530   K 4.2 10/15/2013 0853   K 4.3 04/25/2012 0530   CL 108* 11/26/2012 1025   CL 110 04/25/2012 0530   CO2 17* 10/15/2013 0853   CO2 16* 04/25/2012 0530   BUN 18.1 10/15/2013 0853   BUN 63* 04/25/2012 0530   CREATININE 1.8* 10/15/2013 0853   CREATININE 3.84* 04/25/2012 0530   CREATININE 1.55* 01/27/2012 0921      Component Value Date/Time   CALCIUM 8.9 10/15/2013 0853   CALCIUM 6.9* 04/25/2012 0530   ALKPHOS 134 10/15/2013 0853   ALKPHOS 70 04/22/2012 1456   AST 136* 10/15/2013 0853   AST 21 04/22/2012 1456   ALT 55 10/15/2013 0853   ALT 23 04/22/2012 1456   BILITOT 0.54 10/15/2013 0853   BILITOT 0.6 04/22/2012 1456     GFR Estimated Creatinine Clearance: 27.6 ml/min (by C-G formula based on Cr of 1.8).  Studies:  No results found.   RADIOGRAPHIC STUDIES: No results found.  ASSESSMENT: Madison [name] 68 y.o. female  with a history of Lung cancer - Plan: CBC with Differential, Comprehensive metabolic panel (Cmet) - [facility], Lactate dehydrogenase (LDH) - [facility], CEA, CT Chest Wo Contrast  ARF (acute renal failure) - Plan: Comprehensive metabolic panel (Cmet) - [facility]  Anemia - Plan: CBC with Differential  PLAN:  1. Squamous cell carcinoma of the medial right upper  lobe, stage IIIA.  --Clinically, she remains stable. CT chest from 03/27/13 did not show recurrence of disease. Patient is stable and has no symptoms, except neuropathy.  We will repeat a scan as soon as possible given her relative higher risk of recurrence.  2. Neuropathy.  --She did not tolerate gabapentin and will notify us if this persists or worsens.   3. Anemia.  --Probably secondary to chronic kidney disease. Her hemoglobin is 9.7 today. Creatinine of 1.8 with creatinine clearance less than 20 mL/min.   4. L breast fibroadenoma --She had a core needle biopsy of her left breast on 07/13/2012. This showed a fibroadenoma with calcifications.  Patient instructed to follow up her mammogram for this year.   5. Elevated Transaminases. -- The etiology of the patient's elevated liver function tests is likely due to fatty infiltration, suggestive from the recent CT of chest done on 03/27/2013.  There was mild diffuse low attenuation within the liver suggestive of fatty infiltration.   We will continue to observe for now and advise weight lost.    6. CKDz. -- Followed by Dr. Florene Glen in [place].   Creatinine stable.  Avoiding nephrotoxins.   7. Follow-up. --Port-A-Cath with heparin flushes every 2 months. We will have Mrs. Branan return in 3 months at which time we will check CBC, chemistries, and CEA.    All questions were answered. The patient knows to call the clinic with any problems, questions or concerns. We can certainly see the patient much sooner if necessary.  I spent 15 minutes counseling the patient face to face. The total time spent in the appointment was 25 minutes.    Eternity Dexter, MD 10/16/2013 3:37 PM

## 2013-10-15 NOTE — Telephone Encounter (Signed)
gv adn printed apt sched and avs for pt for May and June

## 2013-10-18 ENCOUNTER — Ambulatory Visit (HOSPITAL_COMMUNITY): Payer: Medicare Other

## 2013-10-23 ENCOUNTER — Ambulatory Visit (HOSPITAL_COMMUNITY)
Admission: RE | Admit: 2013-10-23 | Discharge: 2013-10-23 | Disposition: A | Discharge status: Home / Self Care | Payer: Medicare Other | Source: Ambulatory Visit | Attending: Internal Medicine | Admitting: Internal Medicine

## 2013-10-23 DIAGNOSIS — C3490 Malignant neoplasm of unspecified part of unspecified bronchus or lung: Secondary | ICD-10-CM | POA: Insufficient documentation

## 2013-12-10 ENCOUNTER — Other Ambulatory Visit: Payer: Medicare Other

## 2013-12-10 ENCOUNTER — Ambulatory Visit (HOSPITAL_BASED_OUTPATIENT_CLINIC_OR_DEPARTMENT_OTHER): Payer: Medicare Other

## 2013-12-10 ENCOUNTER — Other Ambulatory Visit: Payer: Self-pay | Admitting: Internal Medicine

## 2013-12-10 VITALS — BP 147/70 | HR 102 | Temp 97.6°F | Resp 16

## 2013-12-10 DIAGNOSIS — C3410 Malignant neoplasm of upper lobe, unspecified bronchus or lung: Secondary | ICD-10-CM

## 2013-12-10 DIAGNOSIS — Z452 Encounter for adjustment and management of vascular access device: Secondary | ICD-10-CM

## 2013-12-10 DIAGNOSIS — Z95828 Presence of other vascular implants and grafts: Secondary | ICD-10-CM

## 2013-12-10 MED ORDER — HEPARIN SOD (PORK) LOCK FLUSH 100 UNIT/ML IV SOLN
500.0000 [IU] | Freq: Once | INTRAVENOUS | Status: AC
  Administered 2013-12-10: 500 [IU] via INTRAVENOUS
  Filled 2013-12-10: qty 5

## 2013-12-10 MED ORDER — SODIUM CHLORIDE 0.9 % IJ SOLN
10.0000 mL | INTRAMUSCULAR | Status: DC | PRN
  Administered 2013-12-10: 10 mL via INTRAVENOUS
  Filled 2013-12-10: qty 10

## 2013-12-10 NOTE — Patient Instructions (Signed)

## 2013-12-11 ENCOUNTER — Telehealth: Payer: Self-pay | Admitting: Cardiovascular Disease

## 2013-12-11 ENCOUNTER — Other Ambulatory Visit: Payer: Self-pay | Admitting: *Deleted

## 2013-12-11 ENCOUNTER — Other Ambulatory Visit: Payer: Self-pay | Admitting: Cardiovascular Disease

## 2013-12-11 MED ORDER — DILTIAZEM HCL ER COATED BEADS 180 MG PO CP24: 180.0000 mg | ORAL_CAPSULE | Freq: Every day | ORAL | Status: DC

## 2013-12-11 NOTE — Telephone Encounter (Deleted)
Error

## 2013-12-20 ENCOUNTER — Ambulatory Visit (INDEPENDENT_AMBULATORY_CARE_PROVIDER_SITE_OTHER): Payer: Medicare Other | Admitting: Cardiovascular Disease

## 2013-12-20 ENCOUNTER — Encounter: Payer: Self-pay | Admitting: Cardiovascular Disease

## 2013-12-20 VITALS — BP 140/100 | HR 96 | Ht 66.0 in | Wt 133.8 lb

## 2013-12-20 DIAGNOSIS — I4891 Unspecified atrial fibrillation: Secondary | ICD-10-CM

## 2013-12-20 MED ORDER — DILTIAZEM HCL ER COATED BEADS 180 MG PO CP24: 180.0000 mg | ORAL_CAPSULE | Freq: Every day | ORAL | Status: DC

## 2013-12-20 NOTE — Patient Instructions (Signed)
Your physician wants you to follow-up in: 1 YEAR with Dr Cooper.  You will receive a reminder letter in the mail two months in advance. If you don't receive a letter, please call our office to schedule the follow-up appointment.  Your physician recommends that you continue on your current medications as directed. Please refer to the Current Medication list given to you today.  

## 2013-12-20 NOTE — Progress Notes (Signed)
    HPI:  68 year old woman presenting for followup evaluation. She has been seen in the past for paroxysmal atrial fibrillation. This occurred in 2012 when she was critically ill in the hospital. She was treated with warfarin. She was noted to have severe LV dysfunction at that time. However, on followup echocardiography her LV function normalized. She had an event recorder that showed normal sinus rhythm without significant arrhythmias. Anticoagulation was stopped in 2013.  The patient is followed closely by oncology. She has stage IIIa squamous cell carcinoma of the lung. She also is followed by nephrology for chronic kidney disease.  Her primary complaint is poor appetite. She has trouble finding foods that she likes to eat. She denies chest pain, shortness of breath, or palpitations. She also complains of generalized weakness. She ambulates with a walker and uses a wheelchair when she has to go any significant distance.  Outpatient Encounter Prescriptions as of 12/20/2013  Medication Sig  . diltiazem (CARDIZEM CD) 180 MG 24 hr capsule Take 1 capsule (180 mg total) by mouth daily.  Marland Kitchen lidocaine-prilocaine (EMLA) cream Apply 1 application topically as needed. For port-a-cath access.  . loratadine (CLARITIN) 10 MG tablet Take 10 mg by mouth daily.    . Multiple Vitamin (MULTIVITAMIN WITH MINERALS) TABS Take 1 tablet by mouth daily.  . Probiotic Product (PHILLIPS COLON HEALTH PO) Take 1 tablet by mouth daily.  . [DISCONTINUED] cholecalciferol (VITAMIN D) 1000 UNITS tablet Take 1,000 Units by mouth daily.    . [DISCONTINUED] B Complex-Biotin-FA (SUPER B-COMPLEX PO) Take 1 capsule by mouth daily.    Allergies  Allergen Reactions  . Lisinopril     Makes face swell    Past Medical History  Diagnosis Date  . Hypertension   . Anemia   . Atrial fibrillation   . Chronic kidney disease   . lung ca     lung ca  . Lung cancer 11/11/10   BP 140/100  Pulse 96  Ht 5\' 6"  (1.676 m)  Wt 60.691 kg  (133 lb 12.8 oz)  BMI 21.61 kg/m2  PHYSICAL EXAM: Blood pressure on my recheck was 148/80 Pt is alert and oriented, NAD HEENT: normal Neck: JVP - normal, carotids 2+= without bruits Lungs: CTA bilaterally CV: RRR without murmur or gallop Abd: soft, NT, Positive BS, no hepatomegaly Ext: no C/C/E, distal pulses intact and equal Skin: warm/dry no rash  EKG:  Normal sinus rhythm 96 beats per minute, nonspecific T wave abnormality, prolonged QT.  ASSESSMENT AND PLAN: 1. Paroxysmal atrial fibrillation. Single occurrence when she was critically ill in the hospital. Will continue with a period of observation. I will see her back next year.  2. Hypertension. Blood pressure improved on my recheck. Initially her blood pressure was taken after she walked in which was very strenuous for her. She has not taken her Cardizem yet this morning.  Sherren Mocha 12/20/2013 8:46 AM

## 2013-12-26 ENCOUNTER — Telehealth: Payer: Self-pay | Admitting: Medical Oncology

## 2013-12-26 ENCOUNTER — Telehealth: Payer: Self-pay | Admitting: Internal Medicine

## 2013-12-26 ENCOUNTER — Other Ambulatory Visit: Payer: Self-pay | Admitting: Medical Oncology

## 2013-12-26 NOTE — Telephone Encounter (Signed)
Pt called stating she has lost weight and just has no appetite. She has a friend who is taking megace and wanted to know if Dr. Juliann Mule could call her some. I explained that she needs to be seen and evaluated to see why she is loosing weight. She said the last time she was here she weighed 134 and last week she saw the cardiologist she had lost 10 pounds. She fixes food but then she just can not eat it. She has a follow up with Dr. Juliann Mule 01/07/14. I asked would she like for me to get her earlier if I can. She states yes. POF sent. She is aware the schedulers will call her with a new appointment.

## 2013-12-26 NOTE — Telephone Encounter (Signed)
S/w the pt and she is aware of her r/s June appts to may per md .

## 2013-12-31 ENCOUNTER — Telehealth: Payer: Self-pay | Admitting: Internal Medicine

## 2013-12-31 ENCOUNTER — Ambulatory Visit (HOSPITAL_BASED_OUTPATIENT_CLINIC_OR_DEPARTMENT_OTHER): Payer: Medicare Other | Admitting: Internal Medicine

## 2013-12-31 ENCOUNTER — Ambulatory Visit (HOSPITAL_BASED_OUTPATIENT_CLINIC_OR_DEPARTMENT_OTHER): Payer: Medicare Other | Admitting: *Deleted

## 2013-12-31 VITALS — BP 137/61 | HR 85 | Temp 98.3°F | Resp 18 | Ht 66.0 in

## 2013-12-31 DIAGNOSIS — C3490 Malignant neoplasm of unspecified part of unspecified bronchus or lung: Secondary | ICD-10-CM

## 2013-12-31 DIAGNOSIS — C3410 Malignant neoplasm of upper lobe, unspecified bronchus or lung: Secondary | ICD-10-CM

## 2013-12-31 DIAGNOSIS — D649 Anemia, unspecified: Secondary | ICD-10-CM

## 2013-12-31 DIAGNOSIS — N179 Acute kidney failure, unspecified: Secondary | ICD-10-CM

## 2013-12-31 DIAGNOSIS — N189 Chronic kidney disease, unspecified: Secondary | ICD-10-CM

## 2013-12-31 DIAGNOSIS — R740 Nonspecific elevation of levels of transaminase and lactic acid dehydrogenase [LDH]: Secondary | ICD-10-CM

## 2013-12-31 DIAGNOSIS — R7402 Elevation of levels of lactic acid dehydrogenase (LDH): Secondary | ICD-10-CM

## 2013-12-31 DIAGNOSIS — G589 Mononeuropathy, unspecified: Secondary | ICD-10-CM

## 2013-12-31 DIAGNOSIS — D249 Benign neoplasm of unspecified breast: Secondary | ICD-10-CM

## 2013-12-31 DIAGNOSIS — N289 Disorder of kidney and ureter, unspecified: Secondary | ICD-10-CM

## 2013-12-31 LAB — CBC WITH DIFFERENTIAL/PLATELET
BASO%: 0.4 % (ref 0.0–2.0)
Basophils Absolute: 0 10*3/uL (ref 0.0–0.1)
EOS ABS: 0 10*3/uL (ref 0.0–0.5)
EOS%: 0.9 % (ref 0.0–7.0)
HCT: 25.9 % — ABNORMAL LOW (ref 34.8–46.6)
HGB: 8.7 g/dL — ABNORMAL LOW (ref 11.6–15.9)
LYMPH%: 19.1 % (ref 14.0–49.7)
MCH: 34.4 pg — AB (ref 25.1–34.0)
MCHC: 33.6 g/dL (ref 31.5–36.0)
MCV: 102.4 fL — AB (ref 79.5–101.0)
MONO#: 0.9 10*3/uL (ref 0.1–0.9)
MONO%: 19.4 % — AB (ref 0.0–14.0)
NEUT#: 2.8 10*3/uL (ref 1.5–6.5)
NEUT%: 60.2 % (ref 38.4–76.8)
PLATELETS: 148 10*3/uL (ref 145–400)
RBC: 2.53 10*6/uL — AB (ref 3.70–5.45)
RDW: 14.2 % (ref 11.2–14.5)
WBC: 4.7 10*3/uL (ref 3.9–10.3)
lymph#: 0.9 10*3/uL (ref 0.9–3.3)

## 2013-12-31 LAB — COMPREHENSIVE METABOLIC PANEL (CC13)
ALK PHOS: 113 U/L (ref 40–150)
ALT: 41 U/L (ref 0–55)
AST: 89 U/L — ABNORMAL HIGH (ref 5–34)
Albumin: 3 g/dL — ABNORMAL LOW (ref 3.5–5.0)
Anion Gap: 12 mEq/L — ABNORMAL HIGH (ref 3–11)
BILIRUBIN TOTAL: 0.62 mg/dL (ref 0.20–1.20)
BUN: 24.8 mg/dL (ref 7.0–26.0)
CO2: 18 mEq/L — ABNORMAL LOW (ref 22–29)
Calcium: 8.1 mg/dL — ABNORMAL LOW (ref 8.4–10.4)
Chloride: 105 mEq/L (ref 98–109)
Creatinine: 2.5 mg/dL — ABNORMAL HIGH (ref 0.6–1.1)
Glucose: 129 mg/dl (ref 70–140)
Potassium: 4.1 mEq/L (ref 3.5–5.1)
SODIUM: 135 meq/L — AB (ref 136–145)
TOTAL PROTEIN: 6.9 g/dL (ref 6.4–8.3)

## 2013-12-31 LAB — CEA: CEA: 1.4 ng/mL (ref 0.0–5.0)

## 2013-12-31 LAB — LACTATE DEHYDROGENASE (CC13): LDH: 209 U/L (ref 125–245)

## 2013-12-31 MED ORDER — SODIUM CHLORIDE 0.9 % IJ SOLN
10.0000 mL | INTRAMUSCULAR | Status: DC | PRN
  Administered 2013-12-31: 10 mL via INTRAVENOUS
  Filled 2013-12-31: qty 10

## 2013-12-31 MED ORDER — HEPARIN SOD (PORK) LOCK FLUSH 100 UNIT/ML IV SOLN
500.0000 [IU] | Freq: Once | INTRAVENOUS | Status: AC
Start: 2013-12-31 — End: 2013-12-31
  Administered 2013-12-31: 500 [IU] via INTRAVENOUS
  Filled 2013-12-31: qty 5

## 2013-12-31 NOTE — Progress Notes (Signed)
[place] OFFICE PROGRESS NOTE  Kirsty Monjaraz, MD [place] [address]  DIAGNOSIS: Lung cancer - Plan: Erythropoietin, CBC with Differential in 1 month, CBC with Differential in 2 months, Basic metabolic panel (Bmet) - [facility], Basic metabolic panel (Bmet) - [facility]  ARF (acute renal failure)  Renal insufficiency - Plan: Erythropoietin  Chief Complaint  Patient presents with  . Follow-up    CURRENT THERAPY: Observation.   INTERVAL HISTORY: Madison [name] 68 y.o. 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 She is accompanied by her husband Francee Piccolo.  She was last seen on 10/15/2013 by me.   She denies recent hospitalizations or emergency room visit.  She is followed by Kidney specialist (Dr. Florene Glen) regularly.  She reports her last follow up was canceled and that she will reschedule.  She gets around with a walker and at times a cane.  Her last mammogram was on 07/13/2012.    The patient denied fever, chills, night sweats, change in weight. She denied headaches, double vision, blurry vision, nasal congestion, hearing problems, odynophagia or dysphagia. No chest pain, palpitations, dyspnea, cough, abdominal pain, nausea, vomiting, diarrhea, constipation, hematochezia. The patient denied dysuria, nocturia, polyuria, hematuria, myalgia, psychiatric problems. She reports adequate hydration and denies NSAID use.   MEDICAL HISTORY: Past Medical History  Diagnosis Date  . Hypertension   . Anemia   . Atrial fibrillation   . Chronic kidney disease   . lung ca     lung ca  . Lung cancer 11/11/10    INTERIM HISTORY: has Lung cancer; Anemia; Renal insufficiency; Atrial fibrillation; ARF (acute renal failure); Diarrhea; SIRS (systemic inflammatory response syndrome); Metabolic acidosis; LV dysfunction; Dehydration; Breast mass, left; Elevated transaminase level; Fatty liver;  Fibroadenoma of left breast; and Urinary frequency on her problem list.    ALLERGIES:  is allergic to lisinopril.  MEDICATIONS: has a current medication list which includes the following prescription(s): diltiazem, lidocaine-prilocaine, loratadine, multivitamin with minerals, and probiotic product, and the following Facility-Administered Medications: sodium chloride.  SURGICAL HISTORY:  Past Surgical History  Procedure Laterality Date  . Port a cath placement     PROBLEM LIST.:  1. 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 The patient had a right pleural effusion at that time that was cytologically negative. She received radiation treatments under the direction of Dr. Tyler Pita, 6600 cGy in 33 fractions from January 27, 2011 through March 15, 2011, in combination with weekly carboplatin and Taxotere. She was started on consolidative treatment with reduced doses of carboplatin, gemcitabine, and Neulasta on May 12, 2011. She has had significant thrombocytopenia not requiring transfusions. Patient completed 4 cycles of consolidative chemotherapy, completed on 07/14/11.  Neulasta was omitted from cycle 4. CT scan of the chest without IV contrast carried out on 10/02/2012 showed no evidence of recurrent lung cancer.  2. Atrial fibrillation.  3. Peripheral neuropathy of feet and hands since 1998.  4. Hypertension.  5. Incontinence of urine.  6. History of shock, diarrhea, metabolic acidosis, and renal failure, requiring admission to the ICU in April 2012.  7. History of left foot drop.  8. Anemia.  9. Renal insufficiency.  10. Admission to the hospital from 04/18/2012 through 04/25/2012 with diarrhea, dehydration, worsening of renal insufficiency secondary to Clostridium difficile colitis. The patient is about to finish up her oral vancomycin.  There was no history of prior antibiotic exposure.  11. Anemia with  unexplained drop in the patient's hemoglobin from 8.2 on 04/25/2012 to 5.6 on 05/01/2012. The patient received 2 units of packed red cells. Stool cards on 05/08/2012 were negative for occult blood. In part, the anemia may be secondary to the patient's underlying renal insufficiency.  12. Bilateral screening mammogram from 05/04/2012 showed calcifications involving the left breast. A core needle biopsy carried out on the left breast on 07/13/2012 showed a fibroadenoma with associated calcifications. No atypia or malignancy was identified.  13. Right port-a-cath placed 01/20/11  REVIEW OF SYSTEMS:   Constitutional: Denies fevers, chills or abnormal weight loss Eyes: Denies blurriness of vision Ears, nose, mouth, throat, and face: Denies mucositis or sore throat Respiratory: Denies cough, dyspnea or wheezes Cardiovascular: Denies palpitation, chest discomfort or lower extremity swelling Gastrointestinal:  Denies nausea, heartburn or change in bowel habits Skin: Denies abnormal skin rashes Lymphatics: Denies new lymphadenopathy or easy bruising Neurological: Reports numbness, tingling of the feet that is longstanding but denies new weaknesses Behavioral/Psych: Mood is stable, no new changes  All other systems were reviewed with the patient and are negative.  PHYSICAL EXAMINATION: ECOG PERFORMANCE STATUS: 2 - Symptomatic, <50% confined to bed  Blood pressure 137/61, pulse 85, temperature 98.3 F (36.8 C), temperature source Oral, resp. rate 18, height 5\' 6"  (1.676 m), weight 0 lb (0 kg).  GENERAL:alert, no distress and comfortable; Sitting in wheelchair.  SKIN: skin color, texture, turgor are normal, no rashes or significant lesions; R port a cath EYES: normal, Conjunctiva are pink and non-injected, sclera clear OROPHARYNX:no exudate, no erythema and lips, buccal mucosa, and tongue normal  NECK: supple, thyroid normal size, non-tender, without nodularity LYMPH:  no palpable lymphadenopathy in  the cervical, axillary or supraclavicular LUNGS: clear to auscultation and percussion with normal breathing effort HEART: regular rate & rhythm and no murmurs and puffy  ankles, left greater than right, without pitting.  ABDOMEN:abdomen soft, non-tender and normal bowel sounds Musculoskeletal:no cyanosis of digits and no clubbing  NEURO: alert & oriented x 3 with fluent speech, no focal motor/sensory deficits  Labs:  Lab Results  Component Value Date   WBC 4.7 12/31/2013   HGB 8.7* 12/31/2013   HCT 25.9* 12/31/2013   MCV 102.4* 12/31/2013   PLT 148 12/31/2013   NEUTROABS 2.8 12/31/2013      Chemistry      Component Value Date/Time   NA 135* 12/31/2013 1016   NA 140 04/25/2012 0530   K 4.1 12/31/2013 1016   K 4.3 04/25/2012 0530   CL 108* 11/26/2012 1025   CL 110 04/25/2012 0530   CO2 18* 12/31/2013 1016   CO2 16* 04/25/2012 0530   BUN 24.8 12/31/2013 1016   BUN 63* 04/25/2012 0530   CREATININE 2.5* 12/31/2013 1016   CREATININE 3.84* 04/25/2012 0530   CREATININE 1.55* 01/27/2012 0921      Component Value Date/Time   CALCIUM 8.1* 12/31/2013 1016   CALCIUM 6.9* 04/25/2012 0530   ALKPHOS 113 12/31/2013 1016   ALKPHOS 70 04/22/2012 1456   AST 89* 12/31/2013 1016   AST 21 04/22/2012 1456   ALT 41 12/31/2013 1016   ALT 23 04/22/2012 1456   BILITOT 0.62 12/31/2013 1016   BILITOT 0.6 04/22/2012 1456     GFR The CrCl is unknown because both a height and weight (above a minimum accepted value) are required for this calculation.  Studies:  No results found.   RADIOGRAPHIC STUDIES: 10/23/2013  CT CHEST WITHOUT CONTRAST (Personally reviewed by me) TECHNIQUE:  Multidetector CT imaging of the chest was performed following the  standard protocol without IV contrast.  COMPARISON: 03/27/2013  FINDINGS:  There is no pleural effusion identified. Paramediastinal radiation  change within the right lung is identified. Sub solid density within  the right upper lobe is nonspecific but appears unchanged  measuring  6 mm. There is a stable 3 mm nodule in the right upper lobe, image  22/series 5. No new or enlarging pulmonary nodules or mass is  identified.  The heart size appears normal. There is no pericardial effusion  identified. No enlarged or enlarging mediastinal or hilar lymph  nodes identified. No axillary or supraclavicular adenopathy.  Incidental imaging through the upper abdomen is on unremarkable.  Review of the visualized bony structures is negative. There are no  aggressive lytic or sclerotic bone lesions identified. L1 superior  endplate compression deformity is unchanged from previous study.  IMPRESSION:  1. Stable CT of the chest.  2. Similar appearance of radiation change within the right lung.  3. No change in sub solid and solid nodules within the right lung.   ASSESSMENT: Madison [name] 68 y.o. female with a history of Lung cancer - Plan: Erythropoietin, CBC with Differential in 1 month, CBC with Differential in 2 months, Basic metabolic panel (Bmet) - [facility], Basic metabolic panel (Bmet) - [facility]  ARF (acute renal failure)  Renal insufficiency - Plan: Erythropoietin  PLAN:  1. 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  --Clinically, she remains stable. CT chest from 03/27/13 did not show recurrence of disease. Patient is stable and has no symptoms, except neuropathy.  Repeat scan as noted above demonstrating stable disease.   2. Neuropathy.  --She did not tolerate gabapentin and will notify us if this persists or worsens.   3. Anemia.  --Probably secondary to chronic kidney disease. Her hemoglobin is 8.7 today. Creatinine of 2.5 (1.8). We will check EPO levels and consider aranesp.  Patient was reluctant secondary to increased clotting risk reported by the Ar[facility].   4. L breast fibroadenoma --She had a core needle biopsy of her left breast on 07/13/2012. This showed a fibroadenoma with calcifications.  Patient instructed to follow up her  mammogram for this year.   5. Elevated Transaminase. -- The etiology of the patient's elevated liver function tests is likely due to fatty infiltration, suggestive from the recent CT of chest done on 03/27/2013.  There was mild diffuse low attenuation within the liver suggestive of fatty infiltration.   We will continue to observe for now and advise weight lost.    6. CKDz. -- Followed by Dr. Florene Glen in [place].   Creatinine slightly increased.  Avoiding nephrotoxins.   7. Follow-up. --Port-A-Cath with heparin flushes every 2 months. We will have Mrs. Declercq return in 2 months at which time we will check CBC, chemistries, and CEA.    All questions were answered. The patient knows to call the clinic with any problems, questions or concerns. We can certainly see the patient much sooner if necessary.  I spent 15 minutes counseling the patient face to face. The total time spent in the appointment was 25 minutes.    Concha Norway, MD 12/31/2013 11:58 AM

## 2013-12-31 NOTE — Patient Instructions (Signed)

## 2013-12-31 NOTE — Telephone Encounter (Signed)
gv adn printed apt sched and avs for pt for June/July and Aug

## 2014-01-07 ENCOUNTER — Other Ambulatory Visit: Payer: Medicare Other

## 2014-01-07 ENCOUNTER — Ambulatory Visit: Payer: Medicare Other

## 2014-02-04 ENCOUNTER — Ambulatory Visit (HOSPITAL_BASED_OUTPATIENT_CLINIC_OR_DEPARTMENT_OTHER): Payer: Medicare Other

## 2014-02-04 ENCOUNTER — Other Ambulatory Visit (HOSPITAL_BASED_OUTPATIENT_CLINIC_OR_DEPARTMENT_OTHER): Payer: Medicare Other

## 2014-02-04 VITALS — BP 153/75 | HR 100 | Temp 97.5°F

## 2014-02-04 DIAGNOSIS — C3490 Malignant neoplasm of unspecified part of unspecified bronchus or lung: Secondary | ICD-10-CM

## 2014-02-04 DIAGNOSIS — Z95828 Presence of other vascular implants and grafts: Secondary | ICD-10-CM

## 2014-02-04 DIAGNOSIS — Z452 Encounter for adjustment and management of vascular access device: Secondary | ICD-10-CM

## 2014-02-04 DIAGNOSIS — N289 Disorder of kidney and ureter, unspecified: Secondary | ICD-10-CM

## 2014-02-04 LAB — CBC WITH DIFFERENTIAL/PLATELET
BASO%: 0.6 % (ref 0.0–2.0)
Basophils Absolute: 0 10*3/uL (ref 0.0–0.1)
EOS%: 2.2 % (ref 0.0–7.0)
Eosinophils Absolute: 0.1 10*3/uL (ref 0.0–0.5)
HEMATOCRIT: 27.8 % — AB (ref 34.8–46.6)
HGB: 8.9 g/dL — ABNORMAL LOW (ref 11.6–15.9)
LYMPH%: 27.5 % (ref 14.0–49.7)
MCH: 34.8 pg — ABNORMAL HIGH (ref 25.1–34.0)
MCHC: 32.2 g/dL (ref 31.5–36.0)
MCV: 108.2 fL — ABNORMAL HIGH (ref 79.5–101.0)
MONO#: 0.5 10*3/uL (ref 0.1–0.9)
MONO%: 13.7 % (ref 0.0–14.0)
NEUT#: 2.1 10*3/uL (ref 1.5–6.5)
NEUT%: 56 % (ref 38.4–76.8)
PLATELETS: 144 10*3/uL — AB (ref 145–400)
RBC: 2.57 10*6/uL — ABNORMAL LOW (ref 3.70–5.45)
RDW: 16.2 % — ABNORMAL HIGH (ref 11.2–14.5)
WBC: 3.7 10*3/uL — AB (ref 3.9–10.3)
lymph#: 1 10*3/uL (ref 0.9–3.3)

## 2014-02-04 LAB — BASIC METABOLIC PANEL (CC13)
Anion Gap: 12 mEq/L — ABNORMAL HIGH (ref 3–11)
BUN: 25.6 mg/dL (ref 7.0–26.0)
CALCIUM: 8.8 mg/dL (ref 8.4–10.4)
CO2: 19 meq/L — AB (ref 22–29)
CREATININE: 2.1 mg/dL — AB (ref 0.6–1.1)
Chloride: 112 mEq/L — ABNORMAL HIGH (ref 98–109)
Glucose: 92 mg/dl (ref 70–140)
Potassium: 3.7 mEq/L (ref 3.5–5.1)
Sodium: 143 mEq/L (ref 136–145)

## 2014-02-04 MED ORDER — HEPARIN SOD (PORK) LOCK FLUSH 100 UNIT/ML IV SOLN
500.0000 [IU] | Freq: Once | INTRAVENOUS | Status: AC
  Administered 2014-02-04: 500 [IU] via INTRAVENOUS
  Filled 2014-02-04: qty 5

## 2014-02-04 MED ORDER — SODIUM CHLORIDE 0.9 % IJ SOLN
10.0000 mL | INTRAMUSCULAR | Status: DC | PRN
  Administered 2014-02-04: 10 mL via INTRAVENOUS
  Filled 2014-02-04: qty 10

## 2014-02-04 NOTE — Patient Instructions (Signed)

## 2014-02-06 LAB — ERYTHROPOIETIN: Erythropoietin: 32.5 m[IU]/mL — ABNORMAL HIGH (ref 2.6–18.5)

## 2014-03-04 ENCOUNTER — Other Ambulatory Visit (HOSPITAL_BASED_OUTPATIENT_CLINIC_OR_DEPARTMENT_OTHER): Payer: Medicare Other

## 2014-03-04 ENCOUNTER — Telehealth: Payer: Self-pay | Admitting: Internal Medicine

## 2014-03-04 ENCOUNTER — Ambulatory Visit (HOSPITAL_BASED_OUTPATIENT_CLINIC_OR_DEPARTMENT_OTHER): Payer: Medicare Other | Admitting: Internal Medicine

## 2014-03-04 ENCOUNTER — Ambulatory Visit (HOSPITAL_BASED_OUTPATIENT_CLINIC_OR_DEPARTMENT_OTHER): Payer: Medicare Other

## 2014-03-04 VITALS — BP 145/74 | HR 100 | Temp 98.0°F | Resp 18 | Ht 66.0 in

## 2014-03-04 DIAGNOSIS — Z452 Encounter for adjustment and management of vascular access device: Secondary | ICD-10-CM

## 2014-03-04 DIAGNOSIS — C3490 Malignant neoplasm of unspecified part of unspecified bronchus or lung: Secondary | ICD-10-CM

## 2014-03-04 DIAGNOSIS — C3410 Malignant neoplasm of upper lobe, unspecified bronchus or lung: Secondary | ICD-10-CM

## 2014-03-04 DIAGNOSIS — Z95828 Presence of other vascular implants and grafts: Secondary | ICD-10-CM

## 2014-03-04 DIAGNOSIS — N189 Chronic kidney disease, unspecified: Secondary | ICD-10-CM

## 2014-03-04 DIAGNOSIS — G609 Hereditary and idiopathic neuropathy, unspecified: Secondary | ICD-10-CM

## 2014-03-04 DIAGNOSIS — D649 Anemia, unspecified: Secondary | ICD-10-CM

## 2014-03-04 DIAGNOSIS — C3411 Malignant neoplasm of upper lobe, right bronchus or lung: Secondary | ICD-10-CM

## 2014-03-04 LAB — BASIC METABOLIC PANEL (CC13)
ANION GAP: 13 meq/L — AB (ref 3–11)
BUN: 20.2 mg/dL (ref 7.0–26.0)
CALCIUM: 8.7 mg/dL (ref 8.4–10.4)
CO2: 19 mEq/L — ABNORMAL LOW (ref 22–29)
Chloride: 110 mEq/L — ABNORMAL HIGH (ref 98–109)
Creatinine: 1.9 mg/dL — ABNORMAL HIGH (ref 0.6–1.1)
Glucose: 101 mg/dl (ref 70–140)
Potassium: 3.9 mEq/L (ref 3.5–5.1)
Sodium: 142 mEq/L (ref 136–145)

## 2014-03-04 LAB — CBC WITH DIFFERENTIAL/PLATELET
BASO%: 2 % (ref 0.0–2.0)
Basophils Absolute: 0.1 10*3/uL (ref 0.0–0.1)
EOS%: 1.1 % (ref 0.0–7.0)
Eosinophils Absolute: 0 10*3/uL (ref 0.0–0.5)
HCT: 29.1 % — ABNORMAL LOW (ref 34.8–46.6)
HGB: 9.5 g/dL — ABNORMAL LOW (ref 11.6–15.9)
LYMPH%: 23.6 % (ref 14.0–49.7)
MCH: 35 pg — ABNORMAL HIGH (ref 25.1–34.0)
MCHC: 32.4 g/dL (ref 31.5–36.0)
MCV: 108 fL — ABNORMAL HIGH (ref 79.5–101.0)
MONO#: 0.5 10*3/uL (ref 0.1–0.9)
MONO%: 14.7 % — ABNORMAL HIGH (ref 0.0–14.0)
NEUT#: 2.1 10*3/uL (ref 1.5–6.5)
NEUT%: 58.6 % (ref 38.4–76.8)
PLATELETS: 141 10*3/uL — AB (ref 145–400)
RBC: 2.7 10*6/uL — AB (ref 3.70–5.45)
RDW: 15.5 % — ABNORMAL HIGH (ref 11.2–14.5)
WBC: 3.5 10*3/uL — ABNORMAL LOW (ref 3.9–10.3)
lymph#: 0.8 10*3/uL — ABNORMAL LOW (ref 0.9–3.3)

## 2014-03-04 MED ORDER — HEPARIN SOD (PORK) LOCK FLUSH 100 UNIT/ML IV SOLN
500.0000 [IU] | Freq: Once | INTRAVENOUS | Status: AC
  Administered 2014-03-04: 500 [IU] via INTRAVENOUS
  Filled 2014-03-04: qty 5

## 2014-03-04 MED ORDER — SODIUM CHLORIDE 0.9 % IJ SOLN
10.0000 mL | INTRAMUSCULAR | Status: DC | PRN
  Administered 2014-03-04: 10 mL via INTRAVENOUS
  Filled 2014-03-04: qty 10

## 2014-03-04 NOTE — Telephone Encounter (Signed)
gv pt appt schedule for sept. per pt flush for aug cxd. pofrt flushed today and pt would like next flush w/fu appt in 8wks.

## 2014-03-04 NOTE — Progress Notes (Signed)
[facility] OFFICE PROGRESS NOTE  Madison Smestad, MD [place] [address]  DIAGNOSIS: Malignant neoplasm of upper lobe of right lung - Plan: CBC with Differential, Comprehensive metabolic panel (Cmet) - [facility], Lactate dehydrogenase (LDH) - [facility]  Anemia, unspecified anemia type - Plan: CBC with Differential, Comprehensive metabolic panel (Cmet) - [facility], Lactate dehydrogenase (LDH) - [facility]  Chief Complaint  Patient presents with  . Lung Cancer    CURRENT THERAPY: Observation.   INTERVAL HISTORY: Madison [name] 68 y.o. 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 She is accompanied by her husband Madison [name].  She was last seen on 10/15/2013 by me.   She denies recent hospitalizations or emergency room visit. She saw Madison [name] on 12/20/2013 for her atrial fibrillation with continued observation.   She is followed by Kidney specialist (Dr. Florene [name]) regularly.   She gets around with a walker and at times a cane.  Her last mammogram was on 07/13/2012.    The patient denied fever, chills, night sweats, change in weight. She denied headaches, double vision, blurry vision, nasal congestion, hearing problems, odynophagia or dysphagia. No chest pain, palpitations, dyspnea, cough, abdominal pain, nausea, vomiting, diarrhea, constipation, hematochezia. The patient denied dysuria, nocturia, polyuria, hematuria, myalgia, psychiatric problems. She reports adequate hydration and denies NSAID use.   MEDICAL HISTORY: Past Medical History  Diagnosis Date  . Hypertension   . Anemia   . Atrial fibrillation   . Chronic kidney disease   . lung ca     lung ca  . Lung cancer 11/11/10    INTERIM HISTORY: has Lung cancer; Anemia; Renal insufficiency; Atrial fibrillation; ARF (acute renal failure); Diarrhea; SIRS (systemic inflammatory response syndrome); Metabolic acidosis; LV dysfunction;  Dehydration; Breast mass, left; Elevated transaminase level; Fatty liver; Fibroadenoma of left breast; and Urinary frequency on her problem list.    ALLERGIES:  is allergic to lisinopril.  MEDICATIONS: has a current medication list which includes the following prescription(s): diltiazem, lidocaine-prilocaine, loratadine, multivitamin with minerals, and probiotic product.  SURGICAL HISTORY:  Past Surgical History  Procedure Laterality Date  . Port a cath placement     PROBLEM LIST.:  1. 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 The patient had a right pleural effusion at that time that was cytologically negative. She received radiation treatments under the direction of Dr. Tyler [name], 6600 cGy in 33 fractions from January 27, 2011 through March 15, 2011, in combination with weekly carboplatin and Taxotere. She was started on consolidative treatment with reduced doses of carboplatin, gemcitabine, and Neulasta on May 12, 2011. She has had significant thrombocytopenia not requiring transfusions. Patient completed 4 cycles of consolidative chemotherapy, completed on 07/14/11.  Neulasta was omitted from cycle 4. CT scan of the chest without IV contrast carried out on 10/02/2012 showed no evidence of recurrent lung cancer.  2. Atrial fibrillation.  3. Peripheral neuropathy of feet and hands since 1998.  4. Hypertension.  5. Incontinence of urine.  6. History of shock, diarrhea, metabolic acidosis, and renal failure, requiring admission to the ICU in April 2012.  7. History of left foot drop.  8. Anemia.  9. Renal insufficiency.  10. Admission to the hospital from 04/18/2012 through 04/25/2012 with diarrhea, dehydration, worsening of renal insufficiency secondary to Clostridium difficile colitis. The patient is about to finish up her oral vancomycin.  There was no history of prior antibiotic exposure.  11. Anemia with  unexplained drop in the patient's hemoglobin from 8.2 on 04/25/2012 to 5.6 on 05/01/2012. The patient received 2 units of packed red cells. Stool cards on 05/08/2012 were negative for occult blood. In part, the anemia may be secondary to the patient's underlying renal insufficiency.  12. Bilateral screening mammogram from 05/04/2012 showed calcifications involving the left breast. A core needle biopsy carried out on the left breast on 07/13/2012 showed a fibroadenoma with associated calcifications. No atypia or malignancy was identified.  13. Right port-a-cath placed 01/20/11  REVIEW OF SYSTEMS:   Constitutional: Denies fevers, chills or abnormal weight loss Eyes: Denies blurriness of vision Ears, nose, mouth, throat, and face: Denies mucositis or sore throat Respiratory: Denies cough, dyspnea or wheezes Cardiovascular: Denies palpitation, chest discomfort or lower extremity swelling Gastrointestinal:  Denies nausea, heartburn or change in bowel habits Skin: Denies abnormal skin rashes Lymphatics: Denies new lymphadenopathy or easy bruising Neurological: Reports numbness, tingling of the feet that is longstanding but denies new weaknesses Behavioral/Psych: Mood is stable, no new changes  All other systems were reviewed with the patient and are negative.  PHYSICAL EXAMINATION: ECOG PERFORMANCE STATUS: 2 - Symptomatic, <50% confined to bed  Blood pressure 145/74, pulse 100, temperature 98 F (36.7 C), temperature source Oral, resp. rate 18, height 5\' 6"  (1.676 m), weight 0 lb (0 kg), SpO2 100.00%.  GENERAL:alert, no distress and comfortable; Sitting in wheelchair.  SKIN: skin color, texture, turgor are normal, no rashes or significant lesions; R port a cath EYES: normal, Conjunctiva are pink and non-injected, sclera clear OROPHARYNX:no exudate, no erythema and lips, buccal mucosa, and tongue normal  NECK: supple, thyroid normal size, non-tender, without nodularity LYMPH:  no palpable  lymphadenopathy in the cervical, axillary or supraclavicular LUNGS: clear to auscultation and percussion with normal breathing effort HEART: regular rate & rhythm and no murmurs and puffy  ankles, left greater than right, without pitting.  ABDOMEN:abdomen soft, non-tender and normal bowel sounds Musculoskeletal:no cyanosis of digits and no clubbing  NEURO: alert & oriented x 3 with fluent speech, no focal motor/sensory deficits  Labs:  Lab Results  Component Value Date   WBC 3.5* 03/04/2014   HGB 9.5* 03/04/2014   HCT 29.1* 03/04/2014   MCV 108.0* 03/04/2014   PLT 141* 03/04/2014   NEUTROABS 2.1 03/04/2014      Chemistry      Component Value Date/Time   NA 142 03/04/2014 0850   NA 140 04/25/2012 0530   K 3.9 03/04/2014 0850   K 4.3 04/25/2012 0530   CL 108* 11/26/2012 1025   CL 110 04/25/2012 0530   CO2 19* 03/04/2014 0850   CO2 16* 04/25/2012 0530   BUN 20.2 03/04/2014 0850   BUN 63* 04/25/2012 0530   CREATININE 1.9* 03/04/2014 0850   CREATININE 3.84* 04/25/2012 0530   CREATININE 1.55* 01/27/2012 0921      Component Value Date/Time   CALCIUM 8.7 03/04/2014 0850   CALCIUM 6.9* 04/25/2012 0530   ALKPHOS 113 12/31/2013 1016   ALKPHOS 70 04/22/2012 1456   AST 89* 12/31/2013 1016   AST 21 04/22/2012 1456   ALT 41 12/31/2013 1016   ALT 23 04/22/2012 1456   BILITOT 0.62 12/31/2013 1016   BILITOT 0.6 04/22/2012 1456     GFR The CrCl is unknown because both a height and weight (above a minimum accepted value) are required for this calculation.  Studies:  No results found.   RADIOGRAPHIC  STUDIES: None  ASSESSMENT: Madison [name] 68 y.o. female with a history of Malignant neoplasm of upper lobe of right lung - Plan: CBC with Differential, Comprehensive metabolic panel (Cmet) - [facility], Lactate dehydrogenase (LDH) - [facility]  Anemia, unspecified anemia type - Plan: CBC with Differential, Comprehensive metabolic panel (Cmet) - [facility], Lactate dehydrogenase (LDH) - [facility]  PLAN:  1. Squamous cell carcinoma  of the medial right upper lobe, stage IIIA.  --Clinically, she remains stable. CT chest from 10/2013 did not show recurrence of disease. Patient is stable and has no symptoms, except neuropathy.  Repeat scan as noted above demonstrating stable disease.   2. Neuropathy.  --She did not tolerate gabapentin and will notify us if this persists or worsens.   3. Anemia.  --Probably secondary to chronic kidney disease. Her hemoglobin is 9.5 today. Creatinine of 1.9 (2.5). Patient was reluctant secondary to increased clotting risk reported by the [facility].   4. L breast fibroadenoma --She had a core needle biopsy of her left breast on 07/13/2012. This showed a fibroadenoma with calcifications.  Patient instructed to follow up her mammogram for this year.   5. History of Elevated Transaminase. -- The etiology of the patient's elevated liver function tests is likely due to fatty infiltration, suggestive from the recent CT of chest done on 03/27/2013.  There was mild diffuse low attenuation within the liver suggestive of fatty infiltration.   We will continue to observe for now and advise weight lost.    6. CKDz. -- Followed by Dr. Florene [name] in [place].   Creatinine stable.  Avoiding nephrotoxins.   7. Follow-up. --Port-A-Cath with heparin flushes every 2 months. We will have Madison [name] return in 2 months at which time we will check CBC, chemistries.    All questions were answered. The patient knows to call the clinic with any problems, questions or concerns. We can certainly see the patient much sooner if necessary.  I spent 15 minutes counseling the patient face to face. The total time spent in the appointment was 25 minutes.    Yajaira Doffing, MD 03/04/2014 12:09 PM

## 2014-03-04 NOTE — Patient Instructions (Signed)

## 2014-04-29 ENCOUNTER — Ambulatory Visit (HOSPITAL_BASED_OUTPATIENT_CLINIC_OR_DEPARTMENT_OTHER): Payer: Medicare Other

## 2014-04-29 ENCOUNTER — Other Ambulatory Visit (HOSPITAL_BASED_OUTPATIENT_CLINIC_OR_DEPARTMENT_OTHER): Payer: Medicare Other

## 2014-04-29 ENCOUNTER — Telehealth: Payer: Self-pay | Admitting: Hematology

## 2014-04-29 ENCOUNTER — Encounter: Payer: Self-pay | Admitting: Hematology

## 2014-04-29 ENCOUNTER — Ambulatory Visit (HOSPITAL_BASED_OUTPATIENT_CLINIC_OR_DEPARTMENT_OTHER): Payer: Medicare Other | Admitting: Hematology

## 2014-04-29 VITALS — BP 149/85 | HR 93 | Temp 98.0°F

## 2014-04-29 VITALS — BP 135/73 | HR 93 | Resp 19 | Ht 66.0 in | Wt 129.1 lb

## 2014-04-29 DIAGNOSIS — C3410 Malignant neoplasm of upper lobe, unspecified bronchus or lung: Secondary | ICD-10-CM | POA: Diagnosis not present

## 2014-04-29 DIAGNOSIS — N189 Chronic kidney disease, unspecified: Secondary | ICD-10-CM | POA: Diagnosis not present

## 2014-04-29 DIAGNOSIS — C3411 Malignant neoplasm of upper lobe, right bronchus or lung: Secondary | ICD-10-CM

## 2014-04-29 DIAGNOSIS — D649 Anemia, unspecified: Secondary | ICD-10-CM | POA: Diagnosis not present

## 2014-04-29 DIAGNOSIS — G589 Mononeuropathy, unspecified: Secondary | ICD-10-CM

## 2014-04-29 DIAGNOSIS — R630 Anorexia: Secondary | ICD-10-CM

## 2014-04-29 DIAGNOSIS — D249 Benign neoplasm of unspecified breast: Secondary | ICD-10-CM

## 2014-04-29 DIAGNOSIS — Z95828 Presence of other vascular implants and grafts: Secondary | ICD-10-CM

## 2014-04-29 LAB — COMPREHENSIVE METABOLIC PANEL (CC13)
ALBUMIN: 3.1 g/dL — AB (ref 3.5–5.0)
ALT: 15 U/L (ref 0–55)
ANION GAP: 18 meq/L — AB (ref 3–11)
AST: 36 U/L — AB (ref 5–34)
Alkaline Phosphatase: 74 U/L (ref 40–150)
BUN: 19.7 mg/dL (ref 7.0–26.0)
CALCIUM: 8.3 mg/dL — AB (ref 8.4–10.4)
CHLORIDE: 105 meq/L (ref 98–109)
CO2: 14 mEq/L — ABNORMAL LOW (ref 22–29)
CREATININE: 1.8 mg/dL — AB (ref 0.6–1.1)
GLUCOSE: 77 mg/dL (ref 70–140)
Potassium: 3.6 mEq/L (ref 3.5–5.1)
Sodium: 137 mEq/L (ref 136–145)
Total Bilirubin: 0.81 mg/dL (ref 0.20–1.20)
Total Protein: 7.4 g/dL (ref 6.4–8.3)

## 2014-04-29 LAB — CBC WITH DIFFERENTIAL/PLATELET
BASO%: 0.7 % (ref 0.0–2.0)
BASOS ABS: 0 10*3/uL (ref 0.0–0.1)
EOS ABS: 0 10*3/uL (ref 0.0–0.5)
EOS%: 0.4 % (ref 0.0–7.0)
HEMATOCRIT: 27.8 % — AB (ref 34.8–46.6)
HEMOGLOBIN: 9 g/dL — AB (ref 11.6–15.9)
LYMPH%: 14.6 % (ref 14.0–49.7)
MCH: 32.9 pg (ref 25.1–34.0)
MCHC: 32.3 g/dL (ref 31.5–36.0)
MCV: 101.8 fL — ABNORMAL HIGH (ref 79.5–101.0)
MONO#: 0.8 10*3/uL (ref 0.1–0.9)
MONO%: 15.4 % — ABNORMAL HIGH (ref 0.0–14.0)
NEUT%: 68.9 % (ref 38.4–76.8)
NEUTROS ABS: 3.7 10*3/uL (ref 1.5–6.5)
Platelets: 139 10*3/uL — ABNORMAL LOW (ref 145–400)
RBC: 2.73 10*6/uL — ABNORMAL LOW (ref 3.70–5.45)
RDW: 16.8 % — AB (ref 11.2–14.5)
WBC: 5.4 10*3/uL (ref 3.9–10.3)
lymph#: 0.8 10*3/uL — ABNORMAL LOW (ref 0.9–3.3)

## 2014-04-29 LAB — LACTATE DEHYDROGENASE (CC13): LDH: 164 U/L (ref 125–245)

## 2014-04-29 MED ORDER — SODIUM CHLORIDE 0.9 % IJ SOLN
10.0000 mL | INTRAMUSCULAR | Status: DC | PRN
  Administered 2014-04-29: 10 mL via INTRAVENOUS
  Filled 2014-04-29: qty 10

## 2014-04-29 MED ORDER — DRONABINOL 2.5 MG PO CAPS: 2.5000 mg | ORAL_CAPSULE | Freq: Two times a day (BID) | ORAL | Status: DC

## 2014-04-29 MED ORDER — HEPARIN SOD (PORK) LOCK FLUSH 100 UNIT/ML IV SOLN
500.0000 [IU] | Freq: Once | INTRAVENOUS | Status: AC
  Administered 2014-04-29: 500 [IU] via INTRAVENOUS
  Filled 2014-04-29: qty 5

## 2014-04-29 NOTE — Progress Notes (Signed)
[name] OFFICE PROGRESS NOTE 04/29/2014  Madison [name], DAVID, MD [facility] [name] [name] [id number]  DIAGNOSIS: NSCLC, Anemia   Chief Complaint  Patient presents with  . Follow-up    CURRENT THERAPY: Observation.   INTERVAL HISTORY:  Madison [name] 68 y.o. 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 She is accompanied by her husband Madison [name].  She was last seen by Dr Juliann Mule on 03/04/2014.    She denies recent hospitalizations or emergency room visit. She saw Dr. Burt Knack on 12/20/2013 for her atrial fibrillation with continued observation.   She is followed by Kidney specialist (Dr. Florene Glen) regularly.   She gets around with a walker and at times a cane.  Her last mammogram was on 07/13/2012.    The patient denied fever, chills, night sweats, change in weight. She denied headaches, double vision, blurry vision, nasal congestion, hearing problems, odynophagia or dysphagia. No chest pain, palpitations, dyspnea, cough, abdominal pain, nausea, vomiting, diarrhea, constipation, hematochezia. The patient denied dysuria, nocturia, polyuria, hematuria, myalgia, psychiatric problems. She reports adequate hydration and denies NSAID use.   Her biggest complaint is not having any appetite whatsoever and anorexia. She has tried ensure and boost supplements but does not like them. She pretty much have to force herself to eat the dinner. She also has CRI and resultant anemia for which she follows with a nephrologist. She does take oral B 12 tablets daily. She also have a fatty liver seen on previous scans and transaminitis which seem like it has resolved on labs today.  MEDICAL HISTORY: Past Medical History  Diagnosis Date  . Hypertension   . Anemia   . Atrial fibrillation   . Chronic kidney disease   . lung ca     lung ca  . Lung cancer 11/11/10    INTERIM HISTORY: has Lung cancer;  Anemia; Renal insufficiency; Atrial fibrillation; ARF (acute renal failure); Diarrhea; SIRS (systemic inflammatory response syndrome); Metabolic acidosis; LV dysfunction; Dehydration; Breast mass, left; Elevated transaminase level; Fatty liver; Fibroadenoma of left breast; and Urinary frequency on her problem list.    ALLERGIES:  is allergic to lisinopril.  MEDICATIONS: has a current medication list which includes the following prescription(s): diltiazem, lidocaine-prilocaine, loratadine, multivitamin with minerals, probiotic product, and dronabinol.  SURGICAL HISTORY:  Past Surgical History  Procedure Laterality Date  . Port a cath placement     PROBLEM LIST.:  1. 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 The patient had a right pleural effusion at that time that was cytologically negative. She received radiation treatments under the direction of Dr. Tyler Pita, 6600 cGy in 33 fractions from January 27, 2011 through March 15, 2011, in combination with weekly carboplatin and Taxotere. She was started on consolidative treatment with reduced doses of carboplatin, gemcitabine, and Neulasta on May 12, 2011. She has had significant thrombocytopenia not requiring transfusions. Patient completed 4 cycles of consolidative chemotherapy, completed on 07/14/11.  Neulasta was omitted from cycle 4. CT scan of the chest without IV contrast carried out on 10/02/2012 showed no evidence of recurrent lung cancer.  2. Atrial fibrillation.  3. Peripheral neuropathy of feet and hands since 1998.  4. Hypertension.  5. Incontinence of urine.  6. History of shock, diarrhea, metabolic acidosis, and renal failure, requiring admission to the ICU in April 2012.  7. History of left  foot drop.  8. Anemia.  9. Renal insufficiency.  10. Admission to the hospital from 04/18/2012 through 04/25/2012 with diarrhea, dehydration, worsening of renal  insufficiency secondary to Clostridium difficile colitis. The patient is about to finish up her oral vancomycin. There was no history of prior antibiotic exposure.  11. Anemia with unexplained drop in the patient's hemoglobin from 8.2 on 04/25/2012 to 5.6 on 05/01/2012. The patient received 2 units of packed red cells. Stool cards on 05/08/2012 were negative for occult blood. In part, the anemia may be secondary to the patient's underlying renal insufficiency.  12. Bilateral screening mammogram from 05/04/2012 showed calcifications involving the left breast. A core needle biopsy carried out on the left breast on 07/13/2012 showed a fibroadenoma with associated calcifications. No atypia or malignancy was identified.  13. Right port-a-cath placed 01/20/11  REVIEW OF SYSTEMS:   Constitutional: Denies fevers, chills or abnormal weight loss, POOR APPETITE Eyes: Denies blurriness of vision Ears, nose, mouth, throat, and face: Denies mucositis or sore throat Respiratory: Denies cough, dyspnea or wheezes Cardiovascular: Denies palpitation, chest discomfort or lower extremity swelling Gastrointestinal:  Denies nausea, heartburn or change in bowel habits Skin: Denies abnormal skin rashes Lymphatics: Denies new lymphadenopathy or easy bruising Neurological: Reports numbness, tingling of the feet that is longstanding but denies new weaknesses Behavioral/Psych: Mood is stable, no new changes  All other systems were reviewed with the patient and are negative.  PHYSICAL EXAMINATION: ECOG PERFORMANCE STATUS: 2  Blood pressure 135/73, pulse 93, resp. rate 19, height 5\' 6"  (1.676 m), weight 129 lb 1.6 oz (58.559 kg).  GENERAL:alert, no distress and comfortable; Sitting in wheelchair.  SKIN: skin color, texture, turgor are normal, no rashes or significant lesions; R port a cath EYES: normal, Conjunctiva are pink and non-injected, sclera clear OROPHARYNX:no exudate, no erythema and lips, buccal mucosa, and  tongue normal  NECK: supple, thyroid normal size, non-tender, without nodularity LYMPH:  no palpable lymphadenopathy in the cervical, axillary or supraclavicular LUNGS: clear to auscultation and percussion with normal breathing effort HEART: regular rate & rhythm and no murmurs and puffy  ankles, left greater than right, without pitting.  ABDOMEN:abdomen soft, non-tender and normal bowel sounds Musculoskeletal:no cyanosis of digits and no clubbing  NEURO: alert & oriented x 3 with fluent speech, no focal motor/sensory deficits  Labs:  Lab Results  Component Value Date   WBC 5.4 04/29/2014   HGB 9.0* 04/29/2014   HCT 27.8* 04/29/2014   MCV 101.8* 04/29/2014   PLT 139* 04/29/2014   NEUTROABS 3.7 04/29/2014      Chemistry      Component Value Date/Time   NA 137 04/29/2014 0920   NA 140 04/25/2012 0530   K 3.6 04/29/2014 0920   K 4.3 04/25/2012 0530   CL 108* 11/26/2012 1025   CL 110 04/25/2012 0530   CO2 14* 04/29/2014 0920   CO2 16* 04/25/2012 0530   BUN 19.7 04/29/2014 0920   BUN 63* 04/25/2012 0530   CREATININE 1.8* 04/29/2014 0920   CREATININE 3.84* 04/25/2012 0530   CREATININE 1.55* 01/27/2012 0921      Component Value Date/Time   CALCIUM 8.3* 04/29/2014 0920   CALCIUM 6.9* 04/25/2012 0530   ALKPHOS 74 04/29/2014 0920   ALKPHOS 70 04/22/2012 1456   AST 36* 04/29/2014 0920   AST 21 04/22/2012 1456   ALT 15 04/29/2014 0920   ALT 23 04/22/2012 1456   BILITOT 0.81 04/29/2014 0920   BILITOT 0.6 04/22/2012 1456  RADIOGRAPHIC STUDIES: CLINICAL DATA: Followup lung cancer EXAM: CT CHEST WITHOUT CONTRAST  TECHNIQUE: Multidetector CT imaging of the chest was performed following the standard protocol without IV contrast.  COMPARISON: 03/27/2013 FINDINGS: There is no pleural effusion identified. Paramediastinal radiation  change within the right lung is identified. Sub solid density within the right upper lobe is nonspecific but appears unchanged measuring  6 mm. There is a stable 3 mm nodule  in the right upper lobe, image 22/series 5. No new or enlarging pulmonary nodules or mass is  identified. The heart size appears normal. There is no pericardial effusion identified. No enlarged or enlarging mediastinal or hilar lymph  nodes identified. No axillary or supraclavicular adenopathy. Incidental imaging through the upper abdomen is on unremarkable. Review of the visualized bony structures is negative. There are no aggressive lytic or sclerotic bone lesions identified. L1 superior endplate compression deformity is unchanged from previous study. IMPRESSION: 1. Stable CT of the chest. 2. Similar appearance of radiation change within the right lung.  3. No change in sub solid and solid nodules within the right lung.   ASSESSMENT: Madison [name] 68 y.o. female with a history of NSCLC 3 1/2 years ago in remission.  PLAN:  1. 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  --Clinically, she remains stable. CT chest from 10/2013 did not show recurrence of disease. Patient is stable and has no symptoms, except neuropathy.  Repeat scan as noted above demonstrating stable disease. I will order a Chest Xray to be done in March 2016.  2. Neuropathy.  --She did not tolerate gabapentin and will notify us if this persists or worsens.   3. Anemia.  --Probably secondary to chronic kidney disease. Her hemoglobin is 9.0 today. Creatinine of 1.8 (2.5). Patient was reluctant secondary to increased clotting risk reported by the [facility]. I am repeating iron studies and ferritin in December 2015.  4. L breast fibroadenoma --She had a core needle biopsy of her left breast on 07/13/2012. This showed a fibroadenoma with calcifications.  Patient instructed to follow up her mammogram for this year.   5. History of Elevated Transaminase. -- The etiology of the patient's elevated liver function tests is likely due to fatty infiltration, suggestive from the recent CT of chest done on 03/27/2013.   There was mild diffuse low attenuation within the liver suggestive of fatty infiltration.   We will continue to observe for now and advise weight lost. Her LFTS have normalized.   6. CKDz. -- Followed by Dr. Florene Glen in [place].   Creatinine stable.  Avoiding nephrotoxins.   7. Decreased appetite. -- I am starting her on low dose Marinol 2.5 mg daily on trial basis for 30 days. She was also told to try breeze and carnation break fast can because of better taste and they are rich in proteins.   8. Follow-up. --Port-A-Cath with heparin flushes every 3 months. We will have Madison [name] return in 2 months at which time we will check CBC, chemistries.    All questions were answered. The patient knows to call the clinic with any problems, questions or concerns. We can certainly see the patient much sooner if necessary.  I spent 25 minutes counseling the patient face to face. The total time spent in the appointment was 30 minutes.    Bernadene Bell, MD Medical Hematologist/Oncologist [place] Pager: [phone] Office No: [phone]

## 2014-04-29 NOTE — Patient Instructions (Signed)

## 2014-04-29 NOTE — Telephone Encounter (Signed)
Spk w/pt confirmed labs/ov will mail out schedule to pt...Marland KitchenMarland KitchenKJ

## 2014-05-02 ENCOUNTER — Telehealth: Payer: Self-pay | Admitting: Dietician

## 2014-05-02 NOTE — Telephone Encounter (Signed)
Brief Outpatient Oncology Nutrition Note  Patient has been identified to be at risk on malnutrition screen.  Wt Readings from Last 10 Encounters:  04/29/14 129 lb 1.6 oz (58.559 kg)  12/20/13 133 lb 12.8 oz (60.691 kg)  10/15/13 143 lb (64.864 kg)  08/07/13 139 lb 6.4 oz (63.231 kg)  05/30/13 146 lb 3.2 oz (66.316 kg)  04/04/13 142 lb 14.4 oz (64.819 kg)  02/05/13 149 lb 8 oz (67.813 kg)  11/26/12 147 lb 12.8 oz (67.042 kg)  09/27/12 147 lb 4.8 oz (66.815 kg)  06/28/12 140 lb 12.8 oz (63.866 kg)     Dx:  Squamous cell carcinoma of the lung, stage IIIA diagnosed 11/11/10.  Called patient due to weight loss.  Patient was last seen by MD 04/29/14.  Poor appetite and prescribed Marinol.  Patient states that it is too expensive.  Patient also complains of diarrhea.  Cannot tolerate milk or greasy foods.  Dislikes Ensure and Boost.  Tried [employer] but did not like the aftertaste and the effect on stomach.  Suggested mixing it with ginger ale.  Has not tried [employer] but does not tolerate milk.  Discussed tips to decrease diarrhea and tips to increase calories and protein in diet.  Patient would benefit from an appointment with the [place] [facility].  Will mail patient handouts "Making the Most of Each Bite", and "Diarrhea" and nutrition along with the [place] [facility] and will refer for an appointment.  Antonieta Iba, RD, LDN

## 2014-05-05 ENCOUNTER — Encounter: Payer: Self-pay | Admitting: Hematology

## 2014-05-05 ENCOUNTER — Telehealth: Payer: Self-pay

## 2014-05-05 NOTE — Progress Notes (Signed)
Called [facility] about dronabinol prior auth, per the rep it did not require a pa and the patient has already picked it up.

## 2014-05-05 NOTE — Telephone Encounter (Signed)
S/w pt and she said her marinol would cost >$100 and she cannot afford it. Dr Lona Kettle spoke highly of it to stimulate her appetite and she would like [place] to try to get prior authorization for it. InBasket sent to [employer]

## 2014-05-07 ENCOUNTER — Telehealth: Payer: Self-pay

## 2014-05-07 NOTE — Telephone Encounter (Signed)
Pt called stating the dronabinol costs $174 for 30 pills and she cannot afford this. S/w Ebony about prior auth or financial assistance. This does not need PA and pt could bring in proof of  Income and if she qualifies she could get a $400 stipend. Pt stated this would not be an option. This message forwarded to Dr Lona Kettle to see if there is an alternative to dronabinol that might benefit the pt. Pt thanked me for my help.

## 2014-05-16 ENCOUNTER — Telehealth: Payer: Self-pay | Admitting: Hematology

## 2014-05-16 NOTE — Telephone Encounter (Signed)
returned pt call and confirmed appt t.d.t with appt

## 2014-05-19 ENCOUNTER — Telehealth: Payer: Self-pay | Admitting: Hematology

## 2014-05-19 NOTE — Telephone Encounter (Signed)
Pt lft msg to r/s Nut w/BN r/s from 10/13 to 10/22, mailed out sch .... Cherylann Banas

## 2014-05-20 ENCOUNTER — Encounter: Payer: Medicare Other | Admitting: Nutrition

## 2014-05-20 NOTE — Telephone Encounter (Signed)
error 

## 2014-05-28 ENCOUNTER — Telehealth: Payer: Self-pay | Admitting: Hematology

## 2014-05-28 NOTE — Telephone Encounter (Signed)
Pt requested to have Nutritionist same day as flush, ok per BN pt has r/s 10/13 & 10/23......Marland Kitchen Mailed sch to pt KJ

## 2014-05-29 ENCOUNTER — Telehealth: Payer: Self-pay

## 2014-05-29 ENCOUNTER — Encounter: Payer: Medicare Other | Admitting: Nutrition

## 2014-05-29 NOTE — Telephone Encounter (Signed)
S/w pt that there is no alternative to the marinol at present. Gave her a happy birthday wish.

## 2014-05-29 NOTE — Telephone Encounter (Signed)
Message copied by Janace Hoard on Thu May 29, 2014 10:56 AM ------      Message from: Hayes Ludwig      Created: Sun May 11, 2014 12:26 PM      Regarding: alternative to marinol       Re: alternative to [facility] for appetite:      1. Dex or prednisone(steroids) too many side effects      2. Ritalin: can cause cardiac arrhythmia      3. Megace: risk for dvt            So no alternative as appetite stimulant. Unless she wants the $400 stipend and agree to [employer] on trial basis.      aasim  ------

## 2014-05-30 ENCOUNTER — Encounter: Payer: Medicare Other | Admitting: Nutrition

## 2014-06-30 ENCOUNTER — Ambulatory Visit: Payer: Medicare Other | Admitting: Nutrition

## 2014-06-30 ENCOUNTER — Ambulatory Visit (HOSPITAL_BASED_OUTPATIENT_CLINIC_OR_DEPARTMENT_OTHER): Payer: Medicare Other

## 2014-06-30 VITALS — BP 150/88 | HR 96 | Temp 97.4°F

## 2014-06-30 DIAGNOSIS — Z95828 Presence of other vascular implants and grafts: Secondary | ICD-10-CM

## 2014-06-30 DIAGNOSIS — Z452 Encounter for adjustment and management of vascular access device: Secondary | ICD-10-CM

## 2014-06-30 DIAGNOSIS — C3411 Malignant neoplasm of upper lobe, right bronchus or lung: Secondary | ICD-10-CM

## 2014-06-30 MED ORDER — SODIUM CHLORIDE 0.9 % IJ SOLN
10.0000 mL | INTRAMUSCULAR | Status: DC | PRN
  Administered 2014-06-30: 10 mL via INTRAVENOUS
  Filled 2014-06-30: qty 10

## 2014-06-30 MED ORDER — HEPARIN SOD (PORK) LOCK FLUSH 100 UNIT/ML IV SOLN
500.0000 [IU] | Freq: Once | INTRAVENOUS | Status: AC
  Administered 2014-06-30: 500 [IU] via INTRAVENOUS
  Filled 2014-06-30: qty 5

## 2014-06-30 NOTE — Patient Instructions (Signed)

## 2014-06-30 NOTE — Progress Notes (Signed)
68 year old female diagnosed with lung cancer in April 2012.  She is a patient of Dr. Lona Kettle.  Past medical history includes hypertension, anemia, atrial fibrillation, and chronic kidney disease.  Medications include probiotics and multivitamin.  Labs include creatinine 1.8, and albumin 3.1 on September 22.  Height: 66 inches. Weight: 129.1 pounds September 22. Usual body weight: 143 pounds March 2015. BMI: 20.85.  Patient declines to be weighed today.  She does not think she will be able to stand without assistance.  She complains of poor appetite and typically eats once daily.  She states she is weak.  She states she likes to smell food cooking before she begins to eat.  She cannot tolerate dairy foods or boost or ensure.  Patient reports diarrhea "every time" she eats.  Nutrition diagnosis: Unintentional weight loss related to poor appetite as evidenced by 10% weight loss over 6 months.  Intervention: Patient educated to consume small amounts of food frequently throughout the day. Educated patient on strategies for increasing oral intake. Provided fact sheets on increasing calories and protein. Educated patient to try clear oral nutrition supplements and provided samples of boost breeze and ensure clear.  Coupons also provided. Reviewed strategies for improving diarrhea.  Encouraged patient to contact physician if stools continue to be frequent. Questions were answered and teach back method used.  Monitoring, evaluation, goals: Patient will tolerate increased calories and protein to minimize weight loss and improve quality-of-life.  Next visit: Patient will contact me by phone for questions.  **Disclaimer: This note was dictated with voice recognition software. Similar sounding words can inadvertently be transcribed and this note may contain transcription errors which may not have been corrected upon publication of note.**

## 2014-07-21 ENCOUNTER — Telehealth: Payer: Self-pay | Admitting: Internal Medicine

## 2014-07-21 NOTE — Telephone Encounter (Signed)
MOVED FROM CP1 TO MM. R/S FROM 12/28 TO 1/6 PER PT - PT HAS NEW D/T FOR LB/MM 08/13/14.

## 2014-08-04 ENCOUNTER — Other Ambulatory Visit: Payer: Medicare Other

## 2014-08-04 ENCOUNTER — Ambulatory Visit: Payer: Medicare Other | Admitting: Internal Medicine

## 2014-08-13 ENCOUNTER — Encounter: Payer: Self-pay | Admitting: Internal Medicine

## 2014-08-13 ENCOUNTER — Other Ambulatory Visit (HOSPITAL_BASED_OUTPATIENT_CLINIC_OR_DEPARTMENT_OTHER): Payer: 59

## 2014-08-13 ENCOUNTER — Ambulatory Visit (HOSPITAL_BASED_OUTPATIENT_CLINIC_OR_DEPARTMENT_OTHER): Payer: 59

## 2014-08-13 ENCOUNTER — Ambulatory Visit (HOSPITAL_BASED_OUTPATIENT_CLINIC_OR_DEPARTMENT_OTHER): Payer: 59 | Admitting: Internal Medicine

## 2014-08-13 ENCOUNTER — Telehealth: Payer: Self-pay | Admitting: Internal Medicine

## 2014-08-13 VITALS — BP 90/55 | HR 94 | Temp 97.4°F | Resp 18 | Ht 66.0 in

## 2014-08-13 DIAGNOSIS — D649 Anemia, unspecified: Secondary | ICD-10-CM

## 2014-08-13 DIAGNOSIS — Z85118 Personal history of other malignant neoplasm of bronchus and lung: Secondary | ICD-10-CM

## 2014-08-13 DIAGNOSIS — C3411 Malignant neoplasm of upper lobe, right bronchus or lung: Secondary | ICD-10-CM

## 2014-08-13 DIAGNOSIS — N189 Chronic kidney disease, unspecified: Secondary | ICD-10-CM

## 2014-08-13 DIAGNOSIS — R630 Anorexia: Secondary | ICD-10-CM

## 2014-08-13 DIAGNOSIS — D638 Anemia in other chronic diseases classified elsewhere: Secondary | ICD-10-CM

## 2014-08-13 DIAGNOSIS — M545 Low back pain, unspecified: Secondary | ICD-10-CM

## 2014-08-13 DIAGNOSIS — Z95828 Presence of other vascular implants and grafts: Secondary | ICD-10-CM

## 2014-08-13 DIAGNOSIS — M549 Dorsalgia, unspecified: Secondary | ICD-10-CM | POA: Insufficient documentation

## 2014-08-13 DIAGNOSIS — R531 Weakness: Secondary | ICD-10-CM

## 2014-08-13 LAB — COMPREHENSIVE METABOLIC PANEL (CC13)
ALK PHOS: 71 U/L (ref 40–150)
ALT: 10 U/L (ref 0–55)
AST: 12 U/L (ref 5–34)
Albumin: 2.6 g/dL — ABNORMAL LOW (ref 3.5–5.0)
Anion Gap: 16 mEq/L — ABNORMAL HIGH (ref 3–11)
BUN: 31.4 mg/dL — ABNORMAL HIGH (ref 7.0–26.0)
CO2: 18 mEq/L — ABNORMAL LOW (ref 22–29)
Calcium: 9.1 mg/dL (ref 8.4–10.4)
Chloride: 108 mEq/L (ref 98–109)
Creatinine: 1.8 mg/dL — ABNORMAL HIGH (ref 0.6–1.1)
EGFR: 32 mL/min/{1.73_m2} — ABNORMAL LOW (ref >90)
Glucose: 113 mg/dl (ref 70–140)
POTASSIUM: 2.9 meq/L — AB (ref 3.5–5.1)
SODIUM: 141 meq/L (ref 136–145)
TOTAL PROTEIN: 6.8 g/dL (ref 6.4–8.3)
Total Bilirubin: 0.95 mg/dL (ref 0.20–1.20)

## 2014-08-13 LAB — CBC WITH DIFFERENTIAL/PLATELET
BASO%: 0.4 % (ref 0.0–2.0)
BASOS ABS: 0 10*3/uL (ref 0.0–0.1)
EOS%: 0.4 % (ref 0.0–7.0)
Eosinophils Absolute: 0 10*3/uL (ref 0.0–0.5)
HCT: 27.2 % — ABNORMAL LOW (ref 34.8–46.6)
HEMOGLOBIN: 8.5 g/dL — AB (ref 11.6–15.9)
LYMPH%: 7.5 % — ABNORMAL LOW (ref 14.0–49.7)
MCH: 30.5 pg (ref 25.1–34.0)
MCHC: 31.3 g/dL — ABNORMAL LOW (ref 31.5–36.0)
MCV: 97.6 fL (ref 79.5–101.0)
MONO#: 0.8 10*3/uL (ref 0.1–0.9)
MONO%: 7.7 % (ref 0.0–14.0)
NEUT%: 84 % — ABNORMAL HIGH (ref 38.4–76.8)
NEUTROS ABS: 9 10*3/uL — AB (ref 1.5–6.5)
PLATELETS: 186 10*3/uL (ref 145–400)
RBC: 2.79 10*6/uL — ABNORMAL LOW (ref 3.70–5.45)
RDW: 19 % — ABNORMAL HIGH (ref 11.2–14.5)
WBC: 10.7 10*3/uL — AB (ref 3.9–10.3)
lymph#: 0.8 10*3/uL — ABNORMAL LOW (ref 0.9–3.3)

## 2014-08-13 LAB — IRON AND TIBC CHCC
%SAT: 86 % — ABNORMAL HIGH (ref 21–57)
Iron: 140 ug/dL (ref 41–142)
TIBC: 163 ug/dL — ABNORMAL LOW (ref 236–444)
UIBC: 23 ug/dL — ABNORMAL LOW (ref 120–384)

## 2014-08-13 LAB — FERRITIN CHCC: Ferritin: 1084 ng/ml — ABNORMAL HIGH (ref 9–269)

## 2014-08-13 MED ORDER — HEPARIN SOD (PORK) LOCK FLUSH 100 UNIT/ML IV SOLN
500.0000 [IU] | Freq: Once | INTRAVENOUS | Status: AC
  Administered 2014-08-13: 500 [IU] via INTRAVENOUS
  Filled 2014-08-13: qty 5

## 2014-08-13 MED ORDER — POTASSIUM CHLORIDE CRYS ER 20 MEQ PO TBCR: 40.0000 meq | EXTENDED_RELEASE_TABLET | Freq: Two times a day (BID) | ORAL | Status: DC

## 2014-08-13 MED ORDER — METHYLPREDNISOLONE (PAK) 4 MG PO TABS: ORAL_TABLET | ORAL | Status: DC

## 2014-08-13 MED ORDER — SODIUM CHLORIDE 0.9 % IJ SOLN
10.0000 mL | INTRAMUSCULAR | Status: DC | PRN
  Administered 2014-08-13: 10 mL via INTRAVENOUS
  Filled 2014-08-13: qty 10

## 2014-08-13 NOTE — Patient Instructions (Signed)

## 2014-08-13 NOTE — Progress Notes (Signed)
[place] [phone]   [phone]  OFFICE PROGRESS NOTE  CHISM, DAVID, MD Madison [name] [address]  DIAGNOSIS:  1) stage IIIa non-small cell lung cancer, squamous cell carcinoma diagnosed on 11/11/2010 involving the right upper lobe. 2) anemia of chronic disease. 3) chronic kidney disease  PRIOR THERAPY:  10 Status post a course of concurrent chemoradiation with weekly carboplatin and Taxotere under the care of Dr. Ralene Ok and Dr. Tammi Klippel completed 03/15/2011. 2) Status post consolidation chemotherapy with carboplatin and gemcitabine for a total of 4 cycles completed 07/14/2011.   CURRENT THERAPY: Observation.  INTERVAL HISTORY: Madison [name] 69 y.o. female returns to the clinic today for follow-up visit accompanied by her husband. She is a former patient of Dr. Ralene Ok and Dr. Juliann Mule. She is here today to establish care with me after these physicians left the practice. The patient continues to complain of increasing fatigue and weakness as well as back pain. She has a history of stage IIIa non-small cell lung cancer status post concurrent chemoradiation followed by consolidation chemotherapy and has been observation since December 2012. The patient also has a history of persistent anemia questionable for anemia of chronic disease. Her last CT scan of the chest was performed in March 2015. The patient has generalized fatigue and weakness. She denied having any significant chest pain but has shortness of breath with exertion with no cough or hemoptysis. She also has lack of appetite and weight loss. She also has a history of urinary incontinence for years.  MEDICAL HISTORY: Past Medical History  Diagnosis Date  . Hypertension   . Anemia   . Atrial fibrillation   . Chronic kidney disease   . lung ca     lung ca  . Lung cancer 11/11/10    ALLERGIES:  is allergic to lisinopril.  MEDICATIONS:  Current Outpatient Prescriptions    Medication Sig Dispense Refill  . diltiazem (CARDIZEM CD) 180 MG 24 hr capsule Take 1 capsule (180 mg total) by mouth daily. 90 capsule 3  . lidocaine-prilocaine (EMLA) cream Apply 1 application topically as needed. For port-a-cath access.    . loratadine (CLARITIN) 10 MG tablet Take 10 mg by mouth daily.      . Multiple Vitamin (MULTIVITAMIN WITH MINERALS) TABS Take 1 tablet by mouth daily.    . Probiotic Product (PHILLIPS COLON HEALTH PO) Take 1 tablet by mouth daily.    Marland Kitchen UNABLE TO FIND "vitamin B"  Pt is unsure what type of "b vitamin".  Takes 1 tablet daily    . dronabinol (MARINOL) 2.5 MG capsule Take 1 capsule (2.5 mg total) by mouth 2 (two) times daily before a meal. (Patient not taking: Reported on 08/13/2014) 30 capsule 2   No current facility-administered medications for this visit.    SURGICAL HISTORY:  Past Surgical History  Procedure Laterality Date  . Port a cath placement      REVIEW OF SYSTEMS:  Constitutional: positive for anorexia, fatigue and weight loss Eyes: negative Ears, nose, mouth, throat, and face: negative Respiratory: positive for dyspnea on exertion Cardiovascular: negative Gastrointestinal: negative Genitourinary:negative Integument/breast: negative Hematologic/lymphatic: negative Musculoskeletal:positive for back pain and muscle weakness Neurological: negative Behavioral/Psych: negative Endocrine: negative Allergic/Immunologic: negative   PHYSICAL EXAMINATION: General appearance: alert, cooperative, fatigued and no distress Head: Normocephalic, without obvious abnormality, atraumatic Neck: no adenopathy, no JVD, supple, symmetrical, trachea midline and thyroid not enlarged, symmetric, no tenderness/mass/nodules Lymph nodes: Cervical, supraclavicular, and axillary nodes  normal. Resp: clear to auscultation bilaterally Back: symmetric, no curvature. ROM normal. No CVA tenderness. Cardio: regular rate and rhythm, S1, S2 normal, no murmur, click, rub  or gallop GI: soft, non-tender; bowel sounds normal; no masses,  no organomegaly Extremities: extremities normal, atraumatic, no cyanosis or edema Neurologic: Alert and oriented X 3, normal strength and tone. Normal symmetric reflexes. Normal coordination and gait  ECOG PERFORMANCE STATUS: 2 - Symptomatic, <50% confined to bed  Blood pressure 90/55, pulse 94, temperature 97.4 F (36.3 C), temperature source Oral, resp. rate 18, height 5\' 6"  (1.676 m), weight 0 lb (0 kg).  LABORATORY DATA: Lab Results  Component Value Date   WBC 10.7* 08/13/2014   HGB 8.5* 08/13/2014   HCT 27.2* 08/13/2014   MCV 97.6 08/13/2014   PLT 186 08/13/2014      Chemistry      Component Value Date/Time   NA 137 04/29/2014 0920   NA 140 04/25/2012 0530   K 3.6 04/29/2014 0920   K 4.3 04/25/2012 0530   CL 108* 11/26/2012 1025   CL 110 04/25/2012 0530   CO2 14* 04/29/2014 0920   CO2 16* 04/25/2012 0530   BUN 19.7 04/29/2014 0920   BUN 63* 04/25/2012 0530   CREATININE 1.8* 04/29/2014 0920   CREATININE 3.84* 04/25/2012 0530   CREATININE 1.55* 01/27/2012 0921      Component Value Date/Time   CALCIUM 8.3* 04/29/2014 0920   CALCIUM 6.9* 04/25/2012 0530   ALKPHOS 74 04/29/2014 0920   ALKPHOS 70 04/22/2012 1456   AST 36* 04/29/2014 0920   AST 21 04/22/2012 1456   ALT 15 04/29/2014 0920   ALT 23 04/22/2012 1456   BILITOT 0.81 04/29/2014 0920   BILITOT 0.6 04/22/2012 1456       RADIOGRAPHIC STUDIES: No results found.  ASSESSMENT AND PLAN: This is a very pleasant 69 years old African-American female with a complicated medical history including history of lung cancer as well as chronic renal failure and chronic anemia probably secondary to her renal disease. The patient has been observation for lung cancer for the last several years with no evidence for disease recurrence. Her last CT scan of the chest was performed in March 2015. I recommended for the patient to have repeat CT scan of the chest,  abdomen and pelvis for reevaluation of her disease and to rule out any disease recurrence or progression especially with the recent back pain and generalized weakness. For the lack of appetite and weight loss, I will order Medrol Dosepak. She has anemia of chronic disease. We will continue to monitor this closely and consider the patient for PRBCs transfusion if her hemoglobin is less than 8.0 G/DL. She may also benefit from treatment with erythrocyte stimulating factor in the future. I will see the patient back for follow-up visit in 2 weeks for reevaluation and discussion of her scan results and further recommendation regarding her condition. She was advised to call immediately if she has any concerning symptoms in the interval. The patient voices understanding of current disease status and treatment options and is in agreement with the current care plan.  All questions were answered. The patient knows to call the clinic with any problems, questions or concerns. We can certainly see the patient much sooner if necessary.  I spent 15 minutes counseling the patient face to face. The total time spent in the appointment was 25 minutes.  Disclaimer: This note was dictated with voice recognition software. Similar sounding words can inadvertently be transcribed  and may not be corrected upon review.

## 2014-08-13 NOTE — Telephone Encounter (Signed)
Pt confirmed labs/ov per 01/06 POF, gave pt AVS.... KJ

## 2014-08-16 ENCOUNTER — Other Ambulatory Visit: Payer: Self-pay

## 2014-08-16 ENCOUNTER — Emergency Department (HOSPITAL_COMMUNITY): Payer: Medicare Other

## 2014-08-16 ENCOUNTER — Encounter (HOSPITAL_COMMUNITY): Payer: Self-pay | Admitting: Emergency Medicine

## 2014-08-16 ENCOUNTER — Inpatient Hospital Stay (HOSPITAL_COMMUNITY)
Admission: EM | Admit: 2014-08-16 | Discharge: 2014-09-03 | DRG: 871 | Disposition: A | Discharge status: Hospice | Payer: Medicare Other | Attending: Internal Medicine | Admitting: Internal Medicine

## 2014-08-16 DIAGNOSIS — R7989 Other specified abnormal findings of blood chemistry: Secondary | ICD-10-CM | POA: Diagnosis present

## 2014-08-16 DIAGNOSIS — M545 Low back pain, unspecified: Secondary | ICD-10-CM

## 2014-08-16 DIAGNOSIS — E870 Hyperosmolality and hypernatremia: Secondary | ICD-10-CM | POA: Diagnosis present

## 2014-08-16 DIAGNOSIS — N179 Acute kidney failure, unspecified: Secondary | ICD-10-CM

## 2014-08-16 DIAGNOSIS — R0902 Hypoxemia: Secondary | ICD-10-CM | POA: Diagnosis not present

## 2014-08-16 DIAGNOSIS — C3400 Malignant neoplasm of unspecified main bronchus: Secondary | ICD-10-CM | POA: Diagnosis not present

## 2014-08-16 DIAGNOSIS — Z809 Family history of malignant neoplasm, unspecified: Secondary | ICD-10-CM | POA: Diagnosis not present

## 2014-08-16 DIAGNOSIS — R1032 Left lower quadrant pain: Secondary | ICD-10-CM

## 2014-08-16 DIAGNOSIS — C3490 Malignant neoplasm of unspecified part of unspecified bronchus or lung: Secondary | ICD-10-CM

## 2014-08-16 DIAGNOSIS — N184 Chronic kidney disease, stage 4 (severe): Secondary | ICD-10-CM | POA: Diagnosis present

## 2014-08-16 DIAGNOSIS — D631 Anemia in chronic kidney disease: Secondary | ICD-10-CM | POA: Diagnosis present

## 2014-08-16 DIAGNOSIS — R10A1 Flank pain, right side: Secondary | ICD-10-CM

## 2014-08-16 DIAGNOSIS — E875 Hyperkalemia: Secondary | ICD-10-CM | POA: Diagnosis present

## 2014-08-16 DIAGNOSIS — E876 Hypokalemia: Secondary | ICD-10-CM | POA: Diagnosis present

## 2014-08-16 DIAGNOSIS — J189 Pneumonia, unspecified organism: Secondary | ICD-10-CM

## 2014-08-16 DIAGNOSIS — I129 Hypertensive chronic kidney disease with stage 1 through stage 4 chronic kidney disease, or unspecified chronic kidney disease: Secondary | ICD-10-CM | POA: Diagnosis present

## 2014-08-16 DIAGNOSIS — I959 Hypotension, unspecified: Secondary | ICD-10-CM | POA: Diagnosis present

## 2014-08-16 DIAGNOSIS — Z66 Do not resuscitate: Secondary | ICD-10-CM | POA: Diagnosis present

## 2014-08-16 DIAGNOSIS — I471 Supraventricular tachycardia: Secondary | ICD-10-CM | POA: Diagnosis present

## 2014-08-16 DIAGNOSIS — Z681 Body mass index (BMI) 19 or less, adult: Secondary | ICD-10-CM

## 2014-08-16 DIAGNOSIS — Z87891 Personal history of nicotine dependence: Secondary | ICD-10-CM | POA: Diagnosis not present

## 2014-08-16 DIAGNOSIS — Z888 Allergy status to other drugs, medicaments and biological substances status: Secondary | ICD-10-CM | POA: Diagnosis not present

## 2014-08-16 DIAGNOSIS — D649 Anemia, unspecified: Secondary | ICD-10-CM | POA: Diagnosis present

## 2014-08-16 DIAGNOSIS — R000 Tachycardia, unspecified: Secondary | ICD-10-CM

## 2014-08-16 DIAGNOSIS — N183 Chronic kidney disease, stage 3 unspecified: Secondary | ICD-10-CM | POA: Insufficient documentation

## 2014-08-16 DIAGNOSIS — E872 Acidosis, unspecified: Secondary | ICD-10-CM | POA: Diagnosis present

## 2014-08-16 DIAGNOSIS — N390 Urinary tract infection, site not specified: Secondary | ICD-10-CM | POA: Diagnosis present

## 2014-08-16 DIAGNOSIS — E43 Unspecified severe protein-calorie malnutrition: Secondary | ICD-10-CM

## 2014-08-16 DIAGNOSIS — M549 Dorsalgia, unspecified: Secondary | ICD-10-CM

## 2014-08-16 DIAGNOSIS — R531 Weakness: Secondary | ICD-10-CM

## 2014-08-16 DIAGNOSIS — I480 Paroxysmal atrial fibrillation: Secondary | ICD-10-CM | POA: Diagnosis present

## 2014-08-16 DIAGNOSIS — J9600 Acute respiratory failure, unspecified whether with hypoxia or hypercapnia: Secondary | ICD-10-CM

## 2014-08-16 DIAGNOSIS — R109 Unspecified abdominal pain: Secondary | ICD-10-CM

## 2014-08-16 DIAGNOSIS — G9340 Encephalopathy, unspecified: Secondary | ICD-10-CM | POA: Diagnosis present

## 2014-08-16 DIAGNOSIS — Z9221 Personal history of antineoplastic chemotherapy: Secondary | ICD-10-CM

## 2014-08-16 DIAGNOSIS — Z9889 Other specified postprocedural states: Secondary | ICD-10-CM | POA: Diagnosis not present

## 2014-08-16 DIAGNOSIS — I428 Other cardiomyopathies: Secondary | ICD-10-CM | POA: Diagnosis present

## 2014-08-16 DIAGNOSIS — Z8249 Family history of ischemic heart disease and other diseases of the circulatory system: Secondary | ICD-10-CM | POA: Diagnosis not present

## 2014-08-16 DIAGNOSIS — R410 Disorientation, unspecified: Secondary | ICD-10-CM

## 2014-08-16 DIAGNOSIS — I5021 Acute systolic (congestive) heart failure: Secondary | ICD-10-CM | POA: Diagnosis present

## 2014-08-16 DIAGNOSIS — I4719 Other supraventricular tachycardia: Secondary | ICD-10-CM | POA: Insufficient documentation

## 2014-08-16 DIAGNOSIS — D6959 Other secondary thrombocytopenia: Secondary | ICD-10-CM | POA: Diagnosis not present

## 2014-08-16 DIAGNOSIS — R1314 Dysphagia, pharyngoesophageal phase: Secondary | ICD-10-CM | POA: Diagnosis present

## 2014-08-16 DIAGNOSIS — Z79899 Other long term (current) drug therapy: Secondary | ICD-10-CM

## 2014-08-16 DIAGNOSIS — R4182 Altered mental status, unspecified: Secondary | ICD-10-CM | POA: Diagnosis present

## 2014-08-16 DIAGNOSIS — R680 Hypothermia, not associated with low environmental temperature: Secondary | ICD-10-CM | POA: Diagnosis not present

## 2014-08-16 DIAGNOSIS — A419 Sepsis, unspecified organism: Secondary | ICD-10-CM | POA: Diagnosis present

## 2014-08-16 DIAGNOSIS — R9431 Abnormal electrocardiogram [ECG] [EKG]: Secondary | ICD-10-CM

## 2014-08-16 DIAGNOSIS — R778 Other specified abnormalities of plasma proteins: Secondary | ICD-10-CM | POA: Diagnosis present

## 2014-08-16 DIAGNOSIS — IMO0001 Reserved for inherently not codable concepts without codable children: Secondary | ICD-10-CM | POA: Insufficient documentation

## 2014-08-16 DIAGNOSIS — Z515 Encounter for palliative care: Secondary | ICD-10-CM | POA: Diagnosis not present

## 2014-08-16 DIAGNOSIS — I4891 Unspecified atrial fibrillation: Secondary | ICD-10-CM | POA: Diagnosis present

## 2014-08-16 DIAGNOSIS — D696 Thrombocytopenia, unspecified: Secondary | ICD-10-CM | POA: Insufficient documentation

## 2014-08-16 LAB — CBC WITH DIFFERENTIAL/PLATELET
BASOS ABS: 0 10*3/uL (ref 0.0–0.1)
Basophils Relative: 0 % (ref 0–1)
EOS ABS: 0 10*3/uL (ref 0.0–0.7)
Eosinophils Relative: 0 % (ref 0–5)
HEMATOCRIT: 25.9 % — AB (ref 36.0–46.0)
HEMOGLOBIN: 8.5 g/dL — AB (ref 12.0–15.0)
Lymphocytes Relative: 6 % — ABNORMAL LOW (ref 12–46)
Lymphs Abs: 0.6 10*3/uL — ABNORMAL LOW (ref 0.7–4.0)
MCH: 30.4 pg (ref 26.0–34.0)
MCHC: 32.8 g/dL (ref 30.0–36.0)
MCV: 92.5 fL (ref 78.0–100.0)
Monocytes Absolute: 0.4 10*3/uL (ref 0.1–1.0)
Monocytes Relative: 5 % (ref 3–12)
NEUTROS PCT: 89 % — AB (ref 43–77)
Neutro Abs: 8.2 10*3/uL — ABNORMAL HIGH (ref 1.7–7.7)
PLATELETS: 212 10*3/uL (ref 150–400)
RBC: 2.8 MIL/uL — AB (ref 3.87–5.11)
RDW: 19.5 % — AB (ref 11.5–15.5)
WBC: 9.2 10*3/uL (ref 4.0–10.5)

## 2014-08-16 LAB — URINALYSIS, ROUTINE W REFLEX MICROSCOPIC
Glucose, UA: NEGATIVE mg/dL
Ketones, ur: 15 mg/dL — AB
NITRITE: NEGATIVE
Protein, ur: NEGATIVE mg/dL
Specific Gravity, Urine: 1.015 (ref 1.005–1.030)
UROBILINOGEN UA: 2 mg/dL — AB (ref 0.0–1.0)
pH: 5.5 (ref 5.0–8.0)

## 2014-08-16 LAB — COMPREHENSIVE METABOLIC PANEL
ALT: 13 U/L (ref 0–35)
AST: 16 U/L (ref 0–37)
Albumin: 2.8 g/dL — ABNORMAL LOW (ref 3.5–5.2)
Alkaline Phosphatase: 64 U/L (ref 39–117)
Anion gap: 11 (ref 5–15)
BILIRUBIN TOTAL: 1.5 mg/dL — AB (ref 0.3–1.2)
BUN: 30 mg/dL — ABNORMAL HIGH (ref 6–23)
CO2: 15 mmol/L — AB (ref 19–32)
Calcium: 9.4 mg/dL (ref 8.4–10.5)
Chloride: 112 mEq/L (ref 96–112)
Creatinine, Ser: 2 mg/dL — ABNORMAL HIGH (ref 0.50–1.10)
GFR calc Af Amer: 28 mL/min — ABNORMAL LOW (ref >90)
GFR, EST NON AFRICAN AMERICAN: 24 mL/min — AB (ref >90)
Glucose, Bld: 115 mg/dL — ABNORMAL HIGH (ref 70–99)
POTASSIUM: 5.4 mmol/L — AB (ref 3.5–5.1)
SODIUM: 138 mmol/L (ref 135–145)
TOTAL PROTEIN: 6.8 g/dL (ref 6.0–8.3)

## 2014-08-16 LAB — I-STAT TROPONIN, ED: TROPONIN I, POC: 0.14 ng/mL — AB (ref 0.00–0.08)

## 2014-08-16 LAB — TROPONIN I
TROPONIN I: 0.19 ng/mL — AB (ref <0.031)
Troponin I: 0.19 ng/mL — ABNORMAL HIGH (ref <0.031)

## 2014-08-16 LAB — URINE MICROSCOPIC-ADD ON

## 2014-08-16 LAB — BRAIN NATRIURETIC PEPTIDE: B Natriuretic Peptide: 4041.2 pg/mL — ABNORMAL HIGH (ref 0.0–100.0)

## 2014-08-16 MED ORDER — MORPHINE SULFATE 2 MG/ML IJ SOLN
2.0000 mg | INTRAMUSCULAR | Status: DC | PRN
  Administered 2014-08-16: 2 mg via INTRAVENOUS
  Filled 2014-08-16: qty 1

## 2014-08-16 MED ORDER — LORATADINE 10 MG PO TABS
10.0000 mg | ORAL_TABLET | Freq: Every day | ORAL | Status: DC
  Administered 2014-08-17 – 2014-08-19 (×3): 10 mg via ORAL
  Filled 2014-08-16 (×3): qty 1

## 2014-08-16 MED ORDER — SODIUM CHLORIDE 0.9 % IV SOLN
Freq: Once | INTRAVENOUS | Status: AC
  Administered 2014-08-16: 22:00:00 via INTRAVENOUS

## 2014-08-16 MED ORDER — SODIUM CHLORIDE 0.9 % IJ SOLN
10.0000 mL | INTRAMUSCULAR | Status: DC | PRN
  Administered 2014-08-17 – 2014-08-31 (×7): 10 mL
  Filled 2014-08-16 (×7): qty 40

## 2014-08-16 MED ORDER — HEPARIN SODIUM (PORCINE) 5000 UNIT/ML IJ SOLN
5000.0000 [IU] | Freq: Three times a day (TID) | INTRAMUSCULAR | Status: DC
  Administered 2014-08-16 – 2014-08-18 (×2): 5000 [IU] via SUBCUTANEOUS
  Filled 2014-08-16 (×6): qty 1

## 2014-08-16 MED ORDER — ONDANSETRON HCL 4 MG/2ML IJ SOLN
4.0000 mg | Freq: Once | INTRAMUSCULAR | Status: AC
Start: 2014-08-16 — End: 2014-08-16
  Administered 2014-08-16: 4 mg via INTRAVENOUS
  Filled 2014-08-16: qty 2

## 2014-08-16 MED ORDER — DILTIAZEM HCL ER COATED BEADS 180 MG PO CP24
180.0000 mg | ORAL_CAPSULE | Freq: Every day | ORAL | Status: DC
  Filled 2014-08-16 (×2): qty 1

## 2014-08-16 MED ORDER — SODIUM CHLORIDE 0.9 % IJ SOLN
3.0000 mL | Freq: Two times a day (BID) | INTRAMUSCULAR | Status: DC
  Administered 2014-08-20 – 2014-08-25 (×7): 3 mL via INTRAVENOUS

## 2014-08-16 MED ORDER — DEXTROSE 5 % IV SOLN
1.0000 g | INTRAVENOUS | Status: DC
  Filled 2014-08-16: qty 10

## 2014-08-16 MED ORDER — SODIUM CHLORIDE 0.9 % IV BOLUS (SEPSIS)
500.0000 mL | Freq: Once | INTRAVENOUS | Status: AC
  Administered 2014-08-16: 500 mL via INTRAVENOUS

## 2014-08-16 MED ORDER — SODIUM CHLORIDE 0.9 % IV BOLUS (SEPSIS)
1000.0000 mL | Freq: Once | INTRAVENOUS | Status: AC
  Administered 2014-08-16: 1000 mL via INTRAVENOUS

## 2014-08-16 MED ORDER — FOLIC ACID 1 MG PO TABS
1.0000 mg | ORAL_TABLET | Freq: Every day | ORAL | Status: DC
  Administered 2014-08-17 – 2014-08-28 (×12): 1 mg via ORAL
  Filled 2014-08-16 (×12): qty 1

## 2014-08-16 MED ORDER — CETYLPYRIDINIUM CHLORIDE 0.05 % MT LIQD
7.0000 mL | Freq: Two times a day (BID) | OROMUCOSAL | Status: DC
  Administered 2014-08-17 – 2014-09-01 (×21): 7 mL via OROMUCOSAL

## 2014-08-16 MED ORDER — CEFTRIAXONE SODIUM 1 G IJ SOLR
1.0000 g | Freq: Once | INTRAMUSCULAR | Status: AC
  Administered 2014-08-16: 1 g via INTRAVENOUS
  Filled 2014-08-16: qty 10

## 2014-08-16 NOTE — H&P (Addendum)
[facility] History and Physical  Madison [name] IPJ:[id number] DOB: 05-Feb-1946 DOA: 08/16/2014  Referring physician: EDP PCP: Concha Norway, MD   Chief Complaint: AMS   HPI: Madison [name] is a 69 y.o. female brought in by family for development of AMS and forgetfulness over the past 1 week.  Per family patient had trouble remembering an appointment she had with her oncologist on 08/13/13, she has also been complaining of unexplained weight loss, diarrhea, and flank pain.  As a result they brought her in to the ED.  Work up in the ED demonstrates a UTI, tachycardia to the 110s, patient is having absolutely no cardiac or pulmonary symptoms at this time, does have slight elevation in troponin of 0.19.  CT abd/pelvis (which Dr. Earlie Server from oncology had planned on anyhow) does show what appears to be a necrotic lymph node suspicious for recurrence of her prior lung cancer.  Review of Systems: Systems reviewed.  As above, otherwise negative  Past Medical History  Diagnosis Date  . Hypertension   . Anemia   . Atrial fibrillation   . Chronic kidney disease   . lung ca     lung ca  . Lung cancer 11/11/10   Past Surgical History  Procedure Laterality Date  . Port a cath placement     Social History:  reports that she quit smoking about 3 years ago. Her smoking use included Cigarettes. She has a 40 pack-year smoking history. She has never used smokeless tobacco. She reports that she does not drink alcohol or use illicit drugs.  Allergies  Allergen Reactions  . Lisinopril Swelling    Makes face swell    Family History  Problem Relation Age of Onset  . Diabetes Mother   . Heart attack Father   . Hypertension Father   . Cancer Sister   . Stroke Brother   . Hypertension Brother   . Heart attack Brother   . Hypertension Brother      Prior to Admission medications   Medication Sig Start Date End Date Taking? Authorizing Provider  diltiazem (CARDIZEM CD) 180 MG 24 hr capsule  Take 1 capsule (180 mg total) by mouth daily. 12/20/13  Yes Blane Ohara, MD  folic acid (FOLVITE) 1 MG tablet Take 1 mg by mouth daily.   Yes Historical Provider, MD  lidocaine-prilocaine (EMLA) cream Apply 1 application topically as needed. For port-a-cath access.   Yes Historical Provider, MD  loratadine (CLARITIN) 10 MG tablet Take 10 mg by mouth daily.     Yes Historical Provider, MD  methylPREDNIsolone (MEDROL DOSPACK) 4 MG tablet follow package directions 08/13/14  Yes Curt Bears, MD  potassium chloride SA (K-DUR,KLOR-CON) 20 MEQ tablet Take 2 tablets (40 mEq total) by mouth 2 (two) times daily. 08/13/14  Yes Curt Bears, MD  Probiotic Product (PROBIOTIC PO) Take 1 tablet by mouth daily.   Yes Historical Provider, MD   Physical Exam: Filed Vitals:   08/16/14 2030  BP: 104/79  Pulse:   Temp:   Resp: 22    BP 104/79 mmHg  Pulse 119  Temp(Src) 97.5 F (36.4 C) (Oral)  Resp 22  Ht 5\' 6"  (1.676 m)  Wt 58.514 kg (129 lb)  BMI 20.83 kg/m2  SpO2 100%  General Appearance:    Alert, mental slowing but oriented and clearly is able to understand what I am telling her about the CT scan results, no distress, appears stated age  Head:    Normocephalic, atraumatic  Eyes:  PERRL, EOMI, sclera non-icteric        Nose:   Nares without drainage or epistaxis. Mucosa, turbinates normal  Throat:   Moist mucous membranes. Oropharynx without erythema or exudate.  Neck:   Supple. No carotid bruits.  No thyromegaly.  No lymphadenopathy.   Back:     No CVA tenderness, no spinal tenderness  Lungs:     Clear to auscultation bilaterally, without wheezes, rhonchi or rales  Chest wall:    No tenderness to palpitation  Heart:    Regular rate and rhythm without murmurs, gallops, rubs  Abdomen:     Soft, non-tender, nondistended, normal bowel sounds, no organomegaly  Genitalia:    deferred  Rectal:    deferred  Extremities:   No clubbing, cyanosis or edema.  Pulses:   2+ and symmetric all  extremities  Skin:   Skin color, texture, turgor normal, no rashes or lesions  Lymph nodes:   Cervical, supraclavicular, and axillary nodes normal  Neurologic:   CNII-XII intact. Normal strength, sensation and reflexes      throughout    Labs on Admission:  Basic Metabolic Panel:  Recent Labs Lab 08/13/14 0855 08/16/14 1607  NA 141 138  K 2.9* 5.4*  CL  --  112  CO2 18* 15*  GLUCOSE 113 115*  BUN 31.4* 30*  CREATININE 1.8* 2.00*  CALCIUM 9.1 9.4   Liver Function Tests:  Recent Labs Lab 08/13/14 0855 08/16/14 1607  AST 12 16  ALT 10 13  ALKPHOS 71 64  BILITOT 0.95 1.5*  PROT 6.8 6.8  ALBUMIN 2.6* 2.8*   No results for input(s): LIPASE, AMYLASE in the last 168 hours. No results for input(s): AMMONIA in the last 168 hours. CBC:  Recent Labs Lab 08/13/14 0855 08/16/14 1607  WBC 10.7* 9.2  NEUTROABS 9.0* 8.2*  HGB 8.5* 8.5*  HCT 27.2* 25.9*  MCV 97.6 92.5  PLT 186 212   Cardiac Enzymes:  Recent Labs Lab 08/16/14 1825  TROPONINI 0.19*    BNP (last 3 results) No results for input(s): PROBNP in the last 8760 hours. CBG: No results for input(s): GLUCAP in the last 168 hours.  Radiological Exams on Admission: Ct Abdomen Pelvis Wo Contrast  08/16/2014   CLINICAL DATA:  Low back/right flank pain x3 days, history of lung cancer  EXAM: CT ABDOMEN AND PELVIS WITHOUT CONTRAST  TECHNIQUE: Multidetector CT imaging of the abdomen and pelvis was performed following the standard protocol without IV contrast.  COMPARISON:  CT abdomen pelvis dated 04/19/2012. Partial comparison to CT chest dated 10/23/2013.  FINDINGS: Lower chest:  Left lower lobe scarring/atelectasis.  3.5 x 3.0 cm lesion adjacent to the descending thoracic aorta (series 21/ image 1), measuring at the upper limits of normal for fluid density, incompletely visualized but new from prior CT. This appearance is worrisome for an necrotic node.  Hepatobiliary: Liver is unremarkable.  Gallbladder is notable for  layering gallstones (series 21/ image 23). No associated inflammatory changes. No intrahepatic or extrahepatic ductal dilatation.  Pancreas: Within normal limits.  Spleen: Within normal limits.  Adrenals/Urinary Tract: Adrenal glands are unremarkable.  Kidneys are grossly unremarkable, noting a 10 mm probable cyst along the right lower kidney (series 21/image 25). No renal calculi or hydronephrosis.  No ureteral or bladder calculi.  Bladder is unremarkable.  Stomach/Bowel: Stomach is notable for a small hiatal or paraesophageal hernia (Series 21/ image 7).  No evidence of bowel obstruction.  Normal appendix.  Colonic diverticulosis, without associated inflammatory changes.  Vascular/Lymphatic: Atherosclerotic calcifications of the abdominal aorta and branch vessels.  1.8 cm short axis gastrohepatic node (series 21/ image 13), new.  Reproductive: Uterus is unremarkable.  No adnexal masses.  Other: No abdominopelvic ascites.  Musculoskeletal: Degenerative changes of the visualized thoracolumbar spine.  Mild superior endplate compression fracture deformity at L1, unchanged from prior CT chest.  IMPRESSION: No evidence of bowel obstruction.  Normal appendix.  Cholelithiasis, without associated inflammatory changes.  No renal, ureteral, or bladder calculi.  3.5 x 3.0 cm suspected necrotic node adjacent to the descending thoracic aorta, incompletely visualized. Additional 1.8 cm short axis gastrohepatic node. These are new from prior CT and suspicious for nodal metastases.  Consider dedicated CT of the chest for further evaluation.   Electronically Signed   By: Julian Hy M.D.   On: 08/16/2014 18:12   Dg Chest Port 1 View  08/16/2014   CLINICAL DATA:  Initial evaluation for tachycardia, history of hypertension and atrial fibrillation as well as lung cancer  EXAM: PORTABLE CHEST - 1 VIEW  COMPARISON:  10/23/2013  FINDINGS: Heart size and vascular pattern are normal. Left lung is clear. Abnormal opacity right  perihilar region consistent with radiation therapy change as described on prior CT scan. Right Port-A-Cath noted. Right lung otherwise clear.  IMPRESSION: No acute findings. Evidence of radiation change right parahilar region.   Electronically Signed   By: Skipper Cliche M.D.   On: 08/16/2014 19:07    EKG: Independently reviewed.  Assessment/Plan Principal Problem:   Lung cancer Active Problems:   CKD (chronic kidney disease) stage 3, GFR 22-29 ml/min   Metabolic acidosis   UTI (lower urinary tract infection)   Hyperkalemia   Altered mental status   1. AMS - most likely due to UTI, but likely will end up getting full re-staging for CA I suspect, will leave this up to oncology team. 2. UTI - rocephin, cultures pending 3. Lung cancer with apparent new recurrence - CT abd pelvis did show a 3.5x3.0 cm necrotic lymph node (also showed a 1.8cm lymph node as well) which is suspicious for recurrence of her prior stage III lung cancer.  I have sent a message to Dr. Earlie Server over epic as well as put the consult in epic so she shows up on their list.  Likely should call for official consult tomorrow. 4. 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 Florene Glen, her creatinine today is 2.0, baseline is about 1.8. 5. Hyperkalemia - likely due to starting potassium supplementation due to hypokalemia at last oncology office visit, will hold the PO potassium and recheck BMP in AM.     Code Status: Full Code  Family Communication: Family at bedside, discussed findings as above with family members and need for oncologic evaluation. Disposition Plan: Admit to inpatient   Time spent: 70 min  GARDNER, JARED M. [facility] Pager [phone]  If 7AM-7PM, please contact the day team taking care of the patient [email or web address] Password [email or web address] 08/16/2014, 9:04 PM

## 2014-08-16 NOTE — Progress Notes (Signed)
Report taken from [place], [place] in ED

## 2014-08-16 NOTE — ED Notes (Addendum)
Per EMS- pt lives with daughter. Seen at [facility] last week for diarrhea, low K. Pt now having lower back pain for three weeks.

## 2014-08-16 NOTE — ED Provider Notes (Signed)
CSN: [id number]     Arrival date & time 08/16/14  1410 History   First MD Initiated Contact with Patient 08/16/14 1410     Chief Complaint  Patient presents with  . Dehydration  . Diarrhea     (Consider location/radiation/quality/duration/timing/severity/associated sxs/prior Treatment) Patient is a 69 y.o. female presenting with back pain.  Back Pain Pain location: R flank. Quality:  Aching Radiates to:  Does not radiate Pain severity:  Severe Pain is:  Same all the time Onset quality:  Gradual Duration:  3 weeks (acutely worse today) Timing:  Constant Progression:  Unchanged Chronicity:  Chronic Context comment:  Lung cancer Relieved by:  Nothing Worsened by:  Movement Ineffective treatments:  None tried Associated symptoms: no abdominal pain, no fever and no weakness     Past Medical History  Diagnosis Date  . Hypertension   . Anemia   . Atrial fibrillation   . Chronic kidney disease   . lung ca     lung ca  . Lung cancer 11/11/10   Past Surgical History  Procedure Laterality Date  . Port a cath placement     Family History  Problem Relation Age of Onset  . Diabetes Mother   . Heart attack Father   . Hypertension Father   . Cancer Sister   . Stroke Brother   . Hypertension Brother   . Heart attack Brother   . Hypertension Brother    History  Substance Use Topics  . Smoking status: Former Smoker -- 1.00 packs/day for 40 years    Types: Cigarettes    Quit date: 11/09/2010  . Smokeless tobacco: Never Used  . Alcohol Use: No   OB History    No data available     Review of Systems  Constitutional: Negative for fever.  Gastrointestinal: Negative for abdominal pain.  Musculoskeletal: Positive for back pain.  Neurological: Negative for weakness.  All other systems reviewed and are negative.     Allergies  Lisinopril  Home Medications   Prior to Admission medications   Medication Sig Start Date End Date Taking? Authorizing Provider   diltiazem (CARDIZEM CD) 180 MG 24 hr capsule Take 1 capsule (180 mg total) by mouth daily. 12/20/13  Yes Blane Ohara, MD  folic acid (FOLVITE) 1 MG tablet Take 1 mg by mouth daily.   Yes Historical Provider, MD  lidocaine-prilocaine (EMLA) cream Apply 1 application topically as needed. For port-a-cath access.   Yes Historical Provider, MD  loratadine (CLARITIN) 10 MG tablet Take 10 mg by mouth daily.     Yes Historical Provider, MD  methylPREDNIsolone (MEDROL DOSPACK) 4 MG tablet follow package directions 08/13/14  Yes Curt Bears, MD  potassium chloride SA (K-DUR,KLOR-CON) 20 MEQ tablet Take 2 tablets (40 mEq total) by mouth 2 (two) times daily. 08/13/14  Yes Curt Bears, MD  Probiotic Product (PROBIOTIC PO) Take 1 tablet by mouth daily.   Yes Historical Provider, MD  dronabinol (MARINOL) 2.5 MG capsule Take 1 capsule (2.5 mg total) by mouth 2 (two) times daily before a meal. Patient not taking: Reported on 08/13/2014 04/29/14   Aasim Marla Roe, MD  UNABLE TO FIND "vitamin B"  Pt is unsure what type of "b vitamin".  Takes 1 tablet daily    Historical Provider, MD   BP 110/76 mmHg  Pulse 121  Temp(Src) 97.5 F (36.4 C) (Oral)  Resp 23  Ht 5\' 6"  (1.676 m)  Wt 129 lb (58.514 kg)  BMI 20.83 kg/m2  SpO2  100% Physical Exam  Constitutional: She is oriented to person, place, and time. She appears well-developed and well-nourished.  HENT:  Head: Normocephalic and atraumatic.  Right Ear: External ear normal.  Left Ear: External ear normal.  Eyes: Conjunctivae and EOM are normal. Pupils are equal, round, and reactive to light.  Neck: Normal range of motion. Neck supple.  Cardiovascular: Regular rhythm, normal heart sounds and intact distal pulses.  Tachycardia present.   Pulmonary/Chest: Effort normal and breath sounds normal.  Abdominal: Soft. Bowel sounds are normal. There is no tenderness.  Musculoskeletal: Normal range of motion.  Neurological: She is alert and oriented to  person, place, and time.  Skin: Skin is warm and dry.  Vitals reviewed.   ED Course  Procedures (including critical care time) Labs Review Labs Reviewed  URINALYSIS, ROUTINE W REFLEX MICROSCOPIC - Abnormal; Notable for the following:    APPearance CLOUDY (*)    Hgb urine dipstick TRACE (*)    Bilirubin Urine MODERATE (*)    Ketones, ur 15 (*)    Urobilinogen, UA 2.0 (*)    Leukocytes, UA LARGE (*)    All other components within normal limits  URINE MICROSCOPIC-ADD ON - Abnormal; Notable for the following:    Bacteria, UA MANY (*)    All other components within normal limits  CBC WITH DIFFERENTIAL - Abnormal; Notable for the following:    RBC 2.80 (*)    Hemoglobin 8.5 (*)    HCT 25.9 (*)    RDW 19.5 (*)    Neutrophils Relative % 89 (*)    Neutro Abs 8.2 (*)    Lymphocytes Relative 6 (*)    Lymphs Abs 0.6 (*)    All other components within normal limits  URINE CULTURE  COMPREHENSIVE METABOLIC PANEL  BRAIN NATRIURETIC PEPTIDE  I-STAT TROPOININ, ED    Imaging Review No results found.   EKG Interpretation   Date/Time:  Saturday August 16 2014 16:08:03 EST Ventricular Rate:  115 PR Interval:  169 QRS Duration: 69 QT Interval:  367 QTC Calculation: 508 R Axis:   -70 Text Interpretation:  Sinus tachycardia Inferior infarct, old Probable  anterior infarct, age indeterminate Prolonged QT interval new t wave  inversions in lateral leads Confirmed by Debby Freiberg [phone]) on  08/16/2014 4:23:18 PM      MDM   Final diagnoses:  Back pain  Right flank pain    69 y.o. female with pertinent PMH of afib, anemia, HTN, stage IIIa non-small cell lung cancer, squamous cell carcinoma presents with R flank pain.  Non reproducible.  No chest pain or dyspnea.  Pt is a poor historian.  HR 121 on arrival.  ECG as above with new t wave inversions in lateral leads.  Pt care to Dr. Jeneen Rinks pending wu.     I have reviewed all laboratory and imaging studies if ordered as above  1.  Back pain   2. Right flank pain         Debby Freiberg, MD 08/16/14 [phone]

## 2014-08-16 NOTE — ED Notes (Signed)
Admitting MD at bedside.

## 2014-08-16 NOTE — Progress Notes (Signed)
Patient's BP 97/78, HR 119 - patient sinus tach. Dr. Alcario Drought made aware. MD notified RN to not give cardizem 24 hr 180 mg dose tonight. Will continue to monitor patient.

## 2014-08-16 NOTE — ED Provider Notes (Addendum)
Pt received from Dr. Colin Rhein.    History reviewed. Patient has had diarrhea for the last few weeks. Weakness over the last 2 days. Has not complained of chest pain. H/O PAF with hospitalization 2013, has followed with Dr. Burt Knack since.  EKG shows inversion of T waves in lateral precordial leads which is new for her. I-STAT troponin of 0.14.  Not hypoxemic. Elevated BNP, but clinically and radiographically not in congestive heart failure. Not hypotensive.  Creatinine baseline 1.8, 2.0 today. Hemoglobin at baseline. Urine appears infected. Has been cultured. His been given IV Rocephin.  CT abdomen ordered by Dr. Colin Rhein shows a possible necrotic aortic lymph node.  This does have a history of lung cancer diagnosed 2012 status post chemotherapy and radiation therapy and observation with Dr. Julien Nordmann currently.  Pt  With Sinus tach on EKG.  Not hypoxemic.   PE in differential, but with Cr. At 2.0 cannot image with CTA.   Tanna Furry, MD 08/16/14 1914  Tanna Furry, MD 08/16/14 [phone]

## 2014-08-16 NOTE — ED Notes (Signed)
NOTIFIED DR. Jeneen Rinks OF PATIENTS LAB RESULTS OF I-STAT TROPONIN @17 :14 PM .08/16/2014.

## 2014-08-17 ENCOUNTER — Inpatient Hospital Stay (HOSPITAL_COMMUNITY): Payer: Medicare Other

## 2014-08-17 ENCOUNTER — Encounter (HOSPITAL_COMMUNITY): Payer: Self-pay

## 2014-08-17 DIAGNOSIS — G9340 Encephalopathy, unspecified: Secondary | ICD-10-CM | POA: Diagnosis present

## 2014-08-17 LAB — BASIC METABOLIC PANEL
Anion gap: 8 (ref 5–15)
BUN: 28 mg/dL — ABNORMAL HIGH (ref 6–23)
CO2: 15 mmol/L — ABNORMAL LOW (ref 19–32)
Calcium: 8.4 mg/dL (ref 8.4–10.5)
Chloride: 117 mEq/L — ABNORMAL HIGH (ref 96–112)
Creatinine, Ser: 1.78 mg/dL — ABNORMAL HIGH (ref 0.50–1.10)
GFR calc Af Amer: 32 mL/min — ABNORMAL LOW (ref >90)
GFR, EST NON AFRICAN AMERICAN: 28 mL/min — AB (ref >90)
Glucose, Bld: 101 mg/dL — ABNORMAL HIGH (ref 70–99)
POTASSIUM: 5 mmol/L (ref 3.5–5.1)
Sodium: 140 mmol/L (ref 135–145)

## 2014-08-17 LAB — CBC
HEMATOCRIT: 21.3 % — AB (ref 36.0–46.0)
HEMOGLOBIN: 6.9 g/dL — AB (ref 12.0–15.0)
MCH: 29.1 pg (ref 26.0–34.0)
MCHC: 32.4 g/dL (ref 30.0–36.0)
MCV: 89.9 fL (ref 78.0–100.0)
Platelets: 194 10*3/uL (ref 150–400)
RBC: 2.37 MIL/uL — ABNORMAL LOW (ref 3.87–5.11)
RDW: 20.2 % — ABNORMAL HIGH (ref 11.5–15.5)
WBC: 10.8 10*3/uL — AB (ref 4.0–10.5)

## 2014-08-17 LAB — TROPONIN I
TROPONIN I: 0.23 ng/mL — AB (ref <0.031)
Troponin I: 0.28 ng/mL — ABNORMAL HIGH (ref <0.031)

## 2014-08-17 LAB — LACTIC ACID, PLASMA: LACTIC ACID, VENOUS: 1.5 mmol/L (ref 0.5–2.2)

## 2014-08-17 LAB — NA AND K (SODIUM & POTASSIUM), RAND UR
Potassium Urine: 45 mmol/L
SODIUM UR: 17 mmol/L

## 2014-08-17 LAB — PREPARE RBC (CROSSMATCH)

## 2014-08-17 LAB — TSH: TSH: 2.9 u[IU]/mL (ref 0.350–4.500)

## 2014-08-17 LAB — VITAMIN B12: Vitamin B-12: 492 pg/mL (ref 211–911)

## 2014-08-17 LAB — AMMONIA: Ammonia: 13 umol/L (ref 11–32)

## 2014-08-17 MED ORDER — SODIUM CHLORIDE 0.9 % IV SOLN
Freq: Once | INTRAVENOUS | Status: AC
  Administered 2014-08-17: 16:00:00 via INTRAVENOUS

## 2014-08-17 MED ORDER — METRONIDAZOLE IN NACL 5-0.79 MG/ML-% IV SOLN
500.0000 mg | Freq: Three times a day (TID) | INTRAVENOUS | Status: DC
  Administered 2014-08-17 – 2014-08-19 (×5): 500 mg via INTRAVENOUS
  Filled 2014-08-17 (×8): qty 100

## 2014-08-17 MED ORDER — DEXTROSE 5 % IV SOLN
1.0000 g | INTRAVENOUS | Status: DC
  Administered 2014-08-17 – 2014-08-18 (×2): 1 g via INTRAVENOUS
  Filled 2014-08-17 (×4): qty 1

## 2014-08-17 MED ORDER — CEFTRIAXONE SODIUM 1 G IJ SOLR
1.0000 g | INTRAMUSCULAR | Status: DC
  Filled 2014-08-17: qty 10

## 2014-08-17 NOTE — Progress Notes (Signed)
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 Inda Merlin once he returns 08/18/2014 CKD stage III-4 -Baseline creatinine 1.8-2.1 -Judicious hydration Anemia -Multifactorial although suspect component of chronic disease -Transfuse 2 units PRBC -I have personally performed rectal exam--no mass, FOBT--negative -08/13/2014 iron saturation 86% -B12, RBC folate Hypertension -Blood pressure is soft -Discontinue diltiazem Elevated troponin -Due to demand ischemia -Patient does not have any chest pain or shortness of breath Hyperkalemia -Due to the patient's home potassium supplementation, CKD -Discontinue potassium supplement  Family Communication:   Husband updated on phone 08/17/14 Disposition Plan:   Home when medically stable       Procedures/Studies: Ct Abdomen Pelvis Wo Contrast  08/16/2014   CLINICAL DATA:  Low back/right flank pain x3 days, history of lung cancer  EXAM: CT ABDOMEN AND PELVIS WITHOUT CONTRAST  TECHNIQUE: Multidetector CT imaging of the abdomen and pelvis was performed following the standard protocol without IV contrast.  COMPARISON:  CT abdomen pelvis dated 04/19/2012. Partial comparison to CT chest dated 10/23/2013.  FINDINGS: Lower chest:  Left lower lobe scarring/atelectasis.  3.5 x 3.0 cm lesion adjacent to the descending  thoracic aorta (series 21/ image 1), measuring at the upper limits of normal for fluid density, incompletely visualized but new from prior CT. This appearance is worrisome for an necrotic node.  Hepatobiliary: Liver is unremarkable.  Gallbladder is notable for layering gallstones (series 21/ image 23). No associated inflammatory changes. No intrahepatic or extrahepatic ductal dilatation.  Pancreas: Within normal limits.  Spleen: Within normal limits.  Adrenals/Urinary Tract: Adrenal glands are unremarkable.  Kidneys are grossly unremarkable, noting a 10 mm probable cyst along the right lower kidney (series 21/image 25). No renal calculi or hydronephrosis.  No ureteral or bladder calculi.  Bladder is unremarkable.  Stomach/Bowel: Stomach is notable for a small hiatal or paraesophageal hernia (Series 21/ image 7).  No evidence of bowel obstruction.  Normal appendix.  Colonic diverticulosis, without associated inflammatory changes.  Vascular/Lymphatic: Atherosclerotic calcifications of the abdominal aorta and branch vessels.  1.8 cm short axis gastrohepatic node (series 21/ image 13), new.  Reproductive: Uterus is unremarkable.  No adnexal masses.  Other: No abdominopelvic ascites.  Musculoskeletal: Degenerative changes of the visualized thoracolumbar spine.  Mild superior endplate compression fracture deformity at L1, unchanged from prior CT chest.  IMPRESSION: No evidence of bowel obstruction.  Normal appendix.  Cholelithiasis, without associated inflammatory changes.  No renal, ureteral, or bladder calculi.  3.5 x 3.0 cm suspected necrotic node adjacent to the descending thoracic aorta, incompletely visualized. Additional 1.8 cm short axis gastrohepatic node. These are new from prior CT and suspicious for nodal metastases.  Consider dedicated CT of the chest for further evaluation.   Electronically Signed   By: Julian Hy M.D.   On: 08/16/2014 18:12   Dg Chest Port 1 View  08/16/2014   CLINICAL DATA:   Initial evaluation for tachycardia, history of hypertension and atrial fibrillation  as well as lung cancer  EXAM: PORTABLE CHEST - 1 VIEW  COMPARISON:  10/23/2013  FINDINGS: Heart size and vascular pattern are normal. Left lung is clear. Abnormal opacity right perihilar region consistent with radiation therapy change as described on prior CT scan. Right Port-A-Cath noted. Right lung otherwise clear.  IMPRESSION: No acute findings. Evidence of radiation change right parahilar region.   Electronically Signed   By: Skipper Cliche M.D.   On: 08/16/2014 19:07         Subjective: Patient denies fevers, chills, headache, chest pain, dyspnea, nausea, vomiting, diarrhea, abdominal pain, dysuria, hematuria   Objective: Filed Vitals:   08/16/14 2115 08/16/14 2212 08/16/14 2216 08/17/14 0432  BP: 107/82  97/78 93/60  Pulse:    101  Temp:   97.7 F (36.5 C) 98.1 F (36.7 C)  TempSrc:   Oral Axillary  Resp: 21  22 20   Height:  5\' 6"  (1.676 m)    Weight:  46.539 kg (102 lb 9.6 oz)    SpO2:   100% 100%    Intake/Output Summary (Last 24 hours) at 08/17/14 0815 Last data filed at 08/16/14 2309  Gross per 24 hour  Intake    150 ml  Output      0 ml  Net    150 ml   Weight change:  Exam:   General:  Pt is alert, follows commands appropriately, not in acute distress  HEENT: No icterus, No thrush,[place]/AT, no meningismus  Cardiovascular: RRR, S1/S2, no rubs, no gallops  Respiratory: CTA bilaterally, no wheezing, no crackles, no rhonchi  Abdomen: Soft/+BS, non tender, non distended, no guarding  Extremities: No edema, No lymphangitis, No petechiae, No rashes, no synovitis  Data Reviewed: Basic Metabolic Panel:  Recent Labs Lab 08/13/14 0855 08/16/14 1607 08/17/14 0310  NA 141 138 140  K 2.9* 5.4* 5.0  CL  --  112 117*  CO2 18* 15* 15*  GLUCOSE 113 115* 101*  BUN 31.4* 30* 28*  CREATININE 1.8* 2.00* 1.78*  CALCIUM 9.1 9.4 8.4   Liver Function Tests:  Recent Labs Lab  08/13/14 0855 08/16/14 1607  AST 12 16  ALT 10 13  ALKPHOS 71 64  BILITOT 0.95 1.5*  PROT 6.8 6.8  ALBUMIN 2.6* 2.8*   No results for input(s): LIPASE, AMYLASE in the last 168 hours. No results for input(s): AMMONIA in the last 168 hours. CBC:  Recent Labs Lab 08/13/14 0855 08/16/14 1607 08/17/14 0310  WBC 10.7* 9.2 10.8*  NEUTROABS 9.0* 8.2*  --   HGB 8.5* 8.5* 6.9*  HCT 27.2* 25.9* 21.3*  MCV 97.6 92.5 89.9  PLT 186 212 194   Cardiac Enzymes:  Recent Labs Lab 08/16/14 1825 08/16/14 2143 08/17/14 0310  TROPONINI 0.19* 0.19* 0.23*   BNP: Invalid input(s): POCBNP CBG: No results for input(s): GLUCAP in the last 168 hours.  No results found for this or any previous visit (from the past 240 hour(s)).   Scheduled Meds: . sodium chloride   Intravenous Once  . antiseptic oral rinse  7 mL Mouth Rinse BID  . cefTRIAXone (ROCEPHIN)  IV  1 g Intravenous Q24H  . folic acid  1 mg Oral Daily  . heparin  5,000 Units Subcutaneous 3 times per day  . loratadine  10 mg Oral Daily  . sodium chloride  3 mL Intravenous Q12H   Continuous Infusions:    Javier Mamone, DO  [facility] Pager [phone]  If 7PM-7AM, please contact night-coverage [email or web address] Password [email or web address]  08/17/2014, 8:15 AM   LOS: 1 day

## 2014-08-17 NOTE — Progress Notes (Signed)
EKG completed. Rogue Bussing, NP notified. EKG placed in patient's chart.

## 2014-08-17 NOTE — Progress Notes (Signed)
Patient admitted to unit via stretcher to [phone]. Patient alert and oriented x 4 - skin intact, port intact, accessed in ED without complications. Patient in no distress, no complaints at this time. Call bell within reach. No family at bedside. Will continue to monitor patient per MD orders.

## 2014-08-17 NOTE — Progress Notes (Signed)
CRITICAL VALUE ALERT  Critical value received:  Hgb 6.9  Date of notification:  08/17/14  Time of notification: 0332  Critical value read back:Yes.    Nurse who received alert:  Sly Parlee, RN  MD notified (1st page):  Rogue Bussing, NP   Time of first page:  0340  MD notified (2nd page):  Time of second page:  Responding MD:  Rogue Bussing, NP at bedside   Time MD responded:  0350    Rogue Bussing, NP stated that "patient is not sure if she wants to receive blood right now." Np will pass along to attending MDs.

## 2014-08-17 NOTE — Progress Notes (Signed)
ANTIBIOTIC CONSULT NOTE - INITIAL  Pharmacy Consult for Cefepime Indication: pneumonia  Allergies  Allergen Reactions  . Lisinopril Swelling    Makes face swell    Patient Measurements: Height: 5\' 6"  (167.6 cm) Weight: 102 lb 9.6 oz (46.539 kg) IBW/kg (Calculated) : 59.3 Vital Signs: Temp: 97.3 F (36.3 C) (01/10 1620) Temp Source: Oral (01/10 1330) BP: 105/75 mmHg (01/10 1620) Pulse Rate: 110 (01/10 1620) Intake/Output from previous day: 01/09 0701 - 01/10 0700 In: 150 [P.O.:150] Out: -  Intake/Output from this shift: Total I/O In: 842.9 [P.O.:120; I.V.:250; Blood:472.9] Out: -   Labs:  Recent Labs  08/16/14 1607 08/17/14 0310  WBC 9.2 10.8*  HGB 8.5* 6.9*  PLT 212 194  CREATININE 2.00* 1.78*   Estimated Creatinine Clearance: 21.6 mL/min (by C-G formula based on Cr of 1.78). No results for input(s): VANCOTROUGH, VANCOPEAK, VANCORANDOM, GENTTROUGH, GENTPEAK, GENTRANDOM, TOBRATROUGH, TOBRAPEAK, TOBRARND, AMIKACINPEAK, AMIKACINTROU, AMIKACIN in the last 72 hours.   Microbiology: No results found for this or any previous visit (from the past 720 hour(s)).  Medical History: Past Medical History  Diagnosis Date  . Hypertension   . Anemia   . Atrial fibrillation   . Chronic kidney disease   . lung ca     lung ca  . Lung cancer 11/11/10    Medications:  Anti-infectives    Start     Dose/Rate Route Frequency Ordered Stop   08/17/14 2200  cefTRIAXone (ROCEPHIN) 1 g in dextrose 5 % 50 mL IVPB  Status:  Discontinued     1 g100 mL/hr over 30 Minutes Intravenous Every 24 hours 08/17/14 1501 08/17/14 1637   08/17/14 1800  metroNIDAZOLE (FLAGYL) IVPB 500 mg     500 mg100 mL/hr over 60 Minutes Intravenous Every 8 hours 08/17/14 1637     08/17/14 1600  cefTRIAXone (ROCEPHIN) 1 g in dextrose 5 % 50 mL IVPB  Status:  Discontinued     1 g100 mL/hr over 30 Minutes Intravenous Every 24 hours 08/16/14 2015 08/17/14 1501   08/16/14 1630  cefTRIAXone (ROCEPHIN) 1 g in  dextrose 5 % 50 mL IVPB     1 g100 mL/hr over 30 Minutes Intravenous  Once 08/16/14 1624 08/16/14 2122     Assessment: 69 year old female with lung cancer to start cefepime for concern of developing PNA on chest CT.   WBC 10.8, afebrile, SCr 2>>1.78, estimated CrCl ~21.6 mL/min.   1/9 Urine >>  Goal of Therapy:  Clinical resolution of infection  Plan:  Cefepime 1g IV every 24 hours.  Follow-up renal function, clinical status, and cultures.   Sloan Leiter, PharmD, BCPS Clinical Pharmacist [phone] 08/17/2014,4:45 PM

## 2014-08-17 NOTE — Progress Notes (Signed)
CRITICAL VALUE ALERT  Critical value received:  Troponin .23  Date of notification: 08/17/14  Time of notification:    Critical value read back:Yes.    Nurse who received alert:  Marlys Stegmaier Grandville Silos RN  MD notified (1st page):  Rogue Bussing, NP   Time of first page:  [phone]  MD notified (2nd page):  Time of second page:  Responding MD:  Rogue Bussing, NP  Time MD responded:  [phone]   - Orders placed for EKG. V/s documented. Patient in no distress & w/o complaints.

## 2014-08-17 NOTE — Progress Notes (Signed)
Pt bp 83/57 hr 104 MD made aware. Pt with no c/o receiving blood transfusion.

## 2014-08-18 DIAGNOSIS — R000 Tachycardia, unspecified: Secondary | ICD-10-CM | POA: Diagnosis present

## 2014-08-18 DIAGNOSIS — R9431 Abnormal electrocardiogram [ECG] [EKG]: Secondary | ICD-10-CM

## 2014-08-18 DIAGNOSIS — R7989 Other specified abnormal findings of blood chemistry: Secondary | ICD-10-CM

## 2014-08-18 DIAGNOSIS — I369 Nonrheumatic tricuspid valve disorder, unspecified: Secondary | ICD-10-CM

## 2014-08-18 DIAGNOSIS — E43 Unspecified severe protein-calorie malnutrition: Secondary | ICD-10-CM

## 2014-08-18 DIAGNOSIS — I480 Paroxysmal atrial fibrillation: Secondary | ICD-10-CM

## 2014-08-18 DIAGNOSIS — N183 Chronic kidney disease, stage 3 (moderate): Secondary | ICD-10-CM

## 2014-08-18 DIAGNOSIS — I5021 Acute systolic (congestive) heart failure: Secondary | ICD-10-CM

## 2014-08-18 DIAGNOSIS — R778 Other specified abnormalities of plasma proteins: Secondary | ICD-10-CM | POA: Diagnosis present

## 2014-08-18 DIAGNOSIS — N390 Urinary tract infection, site not specified: Secondary | ICD-10-CM

## 2014-08-18 LAB — TYPE AND SCREEN
ABO/RH(D): O POS
Antibody Screen: NEGATIVE
UNIT DIVISION: 0
Unit division: 0

## 2014-08-18 LAB — URINE CULTURE: Colony Count: 95000

## 2014-08-18 LAB — BASIC METABOLIC PANEL
Anion gap: 10 (ref 5–15)
BUN: 30 mg/dL — AB (ref 6–23)
CHLORIDE: 116 meq/L — AB (ref 96–112)
CO2: 13 mmol/L — ABNORMAL LOW (ref 19–32)
Calcium: 8.8 mg/dL (ref 8.4–10.5)
Creatinine, Ser: 2.01 mg/dL — ABNORMAL HIGH (ref 0.50–1.10)
GFR calc Af Amer: 28 mL/min — ABNORMAL LOW (ref >90)
GFR, EST NON AFRICAN AMERICAN: 24 mL/min — AB (ref >90)
GLUCOSE: 157 mg/dL — AB (ref 70–99)
POTASSIUM: 4.5 mmol/L (ref 3.5–5.1)
SODIUM: 139 mmol/L (ref 135–145)

## 2014-08-18 LAB — CBC
HCT: 34.3 % — ABNORMAL LOW (ref 36.0–46.0)
Hemoglobin: 11.9 g/dL — ABNORMAL LOW (ref 12.0–15.0)
MCH: 29.2 pg (ref 26.0–34.0)
MCHC: 34.7 g/dL (ref 30.0–36.0)
MCV: 84.3 fL (ref 78.0–100.0)
Platelets: DECREASED 10*3/uL (ref 150–400)
RBC: 4.07 MIL/uL (ref 3.87–5.11)
RDW: 19.1 % — ABNORMAL HIGH (ref 11.5–15.5)
WBC: 13.5 10*3/uL — ABNORMAL HIGH (ref 4.0–10.5)

## 2014-08-18 LAB — FOLATE RBC
FOLATE, HEMOLYSATE: 249.5 ng/mL
Folate, RBC: 1094 ng/mL (ref >498)
Hematocrit: 22.8 % — ABNORMAL LOW (ref 34.0–46.6)

## 2014-08-18 LAB — TROPONIN I: Troponin I: 0.16 ng/mL — ABNORMAL HIGH (ref <0.031)

## 2014-08-18 MED ORDER — SODIUM CHLORIDE 0.9 % IV SOLN
INTRAVENOUS | Status: DC
  Administered 2014-08-18 (×2): via INTRAVENOUS

## 2014-08-18 MED ORDER — FUROSEMIDE 10 MG/ML IJ SOLN: 40.0000 mg | Freq: Once | INTRAMUSCULAR | Status: DC

## 2014-08-18 MED ORDER — ASPIRIN 81 MG PO CHEW
81.0000 mg | CHEWABLE_TABLET | Freq: Every day | ORAL | Status: DC
  Administered 2014-08-18 – 2014-08-28 (×11): 81 mg via ORAL
  Filled 2014-08-18 (×11): qty 1

## 2014-08-18 MED ORDER — FUROSEMIDE 10 MG/ML IJ SOLN
20.0000 mg | Freq: Four times a day (QID) | INTRAMUSCULAR | Status: DC
  Administered 2014-08-18: 20 mg via INTRAVENOUS
  Filled 2014-08-18: qty 2

## 2014-08-18 MED ORDER — CARVEDILOL 3.125 MG PO TABS
3.1250 mg | ORAL_TABLET | Freq: Two times a day (BID) | ORAL | Status: DC
  Administered 2014-08-18: 3.125 mg via ORAL
  Filled 2014-08-18 (×4): qty 1

## 2014-08-18 MED ORDER — SODIUM CHLORIDE 0.9 % IV BOLUS (SEPSIS): 500.0000 mL | Freq: Once | INTRAVENOUS | Status: DC

## 2014-08-18 MED ORDER — ENOXAPARIN SODIUM 30 MG/0.3ML ~~LOC~~ SOLN
30.0000 mg | SUBCUTANEOUS | Status: DC
  Administered 2014-08-19: 30 mg via SUBCUTANEOUS
  Filled 2014-08-18 (×2): qty 0.3

## 2014-08-18 NOTE — Progress Notes (Signed)
BP was 92/62 earlier, re-checked manually and was 90/70.  Dr. Carles Collet was text paged and entered orders for 574ml bolus.  When RN released orders, saw that Dr. Debara Pickett with cardology had place new orders for IV Lasix 40mg , coreg 3.125mg  & aspirin 81mg .  Will continue to monitor and seek clarity on new orders.  Alphonzo Lemmings, RN

## 2014-08-18 NOTE — Progress Notes (Signed)
Utilization review completed. Paiton Boultinghouse, RN, BSN. 

## 2014-08-18 NOTE — Progress Notes (Addendum)
PROGRESS NOTE  Madison [name] KGU:[id number] DOB: 1946-03-11 DOA: 08/16/2014 PCP: Concha Norway, MD  Brief history 69 year old female with a history of squamous cell carcinoma of the lung previously treated with chemotherapy which she completed 07/14/2011, CKD, hypertension presented from home secondary to increasing forgetfulness and mental status change for one week. Apparently, the patient had had increasing difficulty remembering her appointments. There is also been loose stools, flank pain, and weight loss. The patient was found to have hyperkalemia with pyuria and sinus tachycardia in the emergency department. The patient's hemoglobin dropped to 6.9 on the subsequent day after admission. The patient was transfused with 2 units PRBC.  She was admitted and started on intravenous fluids. CT of the abdomen and pelvis, which was confirmed by CT of the chest revealed a new 4.1 x 2.7 soft tissue mass in the lower mediastinum. This was suspicious for a new necrotic lymph node. Assessment/Plan: Acute Encephalopathy -08/18/14--much more alert, but still pleasantly confused -Multifactorial including urinary tract infection, aspiration, worsening anemia, electrolyte derangement, medications including recent steroids and Marinol -Patient was recently started on a Medrol pack on 08/13/2014 -MRI brain--negative for stroke or mets -TSH--2.900  -ammonia--13 Pyuria/Bacteruria -Although the patient's urine culture did not show a single predominant organism, I will continue antibiotics given the Patient's clinical presentation and pyuria -Continue cefepime   Pulmonary infiltrate/consolidation -suspect a component of aspiration -RN reports pt is pooling food in mouth -Swallow evaluation  Elevated troponin -Due to demand ischemia -repeat EKG after RBC transfusion--still with significant T-wave inversions V3-V6-->consult cardiology for opinion/need for stress test -Patient does not have any chest  pain or shortness of breath  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 There was also left lower lobe volume loss with consolidation -Case discussed with pt's oncologist--Dr. Burnett Sheng stated pt can f/u as outpt--she already has an appt  CKD stage III-4 -Baseline creatinine 1.8-2.1 -Judicious hydration Anemia -Multifactorial although suspect component of chronic disease -Transfuse 2 units PRBC -I have personally performed rectal exam--no mass, FOBT--negative -08/13/2014 iron saturation 86% -B12, RBC folate Hypertension -Blood pressure is soft -Discontinue diltiazem   Hyperkalemia -Due to the patient's home potassium supplementation, CKD -Discontinue potassium supplement Deconditioning -PT evaluation Family Communication: Husband updated on phone 08/18/14 Disposition Plan: Home when medically stable --08/19/14         Procedures/Studies: Ct Abdomen Pelvis Wo Contrast  08/16/2014   CLINICAL DATA:  Low back/right flank pain x3 days, history of lung cancer  EXAM: CT ABDOMEN AND PELVIS WITHOUT CONTRAST  TECHNIQUE: Multidetector CT imaging of the abdomen and pelvis was performed following the standard protocol without IV contrast.  COMPARISON:  CT abdomen pelvis dated 04/19/2012. Partial comparison to CT chest dated 10/23/2013.  FINDINGS: Lower chest:  Left lower lobe scarring/atelectasis.  3.5 x 3.0 cm lesion adjacent to the descending thoracic aorta (series 21/ image 1), measuring at the upper limits of normal for fluid density, incompletely visualized but new from prior CT. This appearance is worrisome for an necrotic node.  Hepatobiliary: Liver is unremarkable.  Gallbladder is notable for layering gallstones (series 21/ image 23). No  associated inflammatory changes. No intrahepatic or extrahepatic ductal dilatation.  Pancreas: Within normal limits.  Spleen: Within normal limits.  Adrenals/Urinary Tract: Adrenal  glands are unremarkable.  Kidneys are grossly unremarkable, noting a 10 mm probable cyst along the right lower kidney (series 21/image 25). No renal calculi or hydronephrosis.  No ureteral or bladder calculi.  Bladder is unremarkable.  Stomach/Bowel: Stomach is notable for a small hiatal or paraesophageal hernia (Series 21/ image 7).  No evidence of bowel obstruction.  Normal appendix.  Colonic diverticulosis, without associated inflammatory changes.  Vascular/Lymphatic: Atherosclerotic calcifications of the abdominal aorta and branch vessels.  1.8 cm short axis gastrohepatic node (series 21/ image 13), new.  Reproductive: Uterus is unremarkable.  No adnexal masses.  Other: No abdominopelvic ascites.  Musculoskeletal: Degenerative changes of the visualized thoracolumbar spine.  Mild superior endplate compression fracture deformity at L1, unchanged from prior CT chest.  IMPRESSION: No evidence of bowel obstruction.  Normal appendix.  Cholelithiasis, without associated inflammatory changes.  No renal, ureteral, or bladder calculi.  3.5 x 3.0 cm suspected necrotic node adjacent to the descending thoracic aorta, incompletely visualized. Additional 1.8 cm short axis gastrohepatic node. These are new from prior CT and suspicious for nodal metastases.  Consider dedicated CT of the chest for further evaluation.   Electronically Signed   By: Julian Hy M.D.   On: 08/16/2014 18:12   Ct Chest Wo Contrast  08/17/2014   CLINICAL DATA:  69 year old female with history of squamous cell carcinoma of the lung.  EXAM: CT CHEST WITHOUT CONTRAST  TECHNIQUE: Multidetector CT imaging of the chest was performed following the standard protocol without IV contrast.  COMPARISON:  Chest CT 10/23/2013.  FINDINGS: Mediastinum/Lymph Nodes: Right internal  jugular single-lumen porta cath with tip terminating at the superior cavoatrial junction. Heart size is normal. Trace amount of pericardial fluid and/or thickening, unlikely to be of any hemodynamic significance at this time. No associated pericardial calcification. 4.1 x 2.7 cm soft tissue attenuation lesion in the middle mediastinum abutting the left side of the descending thoracic aorta (image 36 of series 201), and in close contact with the distal third of the esophagus.  Lungs/Pleura: Chronic postradiation changes of mass-like fibrosis in the right perihilar region are unchanged. Small bilateral pleural effusions are new compared to the prior examination. Extensive volume loss, architectural distortion and airspace consolidation in the basal segments of the left lower lobe. Scattered micronodules in the lungs bilaterally, similar to the prior examination, with the largest of these clustered in the anterior aspect of the right upper lobe, similar to the prior examination, favored to reflect predominantly areas of mucoid impaction.  Musculoskeletal/Soft Tissues: Multiple old vertebral body compression fractures, unchanged compared to the prior examination, involving predominantly the superior endplates of T2, T7, H21, T12 and L1, most severe at L1 where there is approximately 15% loss of anterior vertebral body height. There are no aggressive appearing lytic or blastic lesions noted in the visualized portions of the skeleton.  Upper Abdomen: Enlarged gastrohepatic ligament lymph node measuring 1.9 cm in short axis.  IMPRESSION: 1. Large soft tissue mass in the lower middle mediastinum measuring up to 4.1 x 2.7 cm, likely to represent an enlarged lymph node. Given the gastrohepatic ligament lymphadenopathy, findings are concerning for potential nodal spread of disease in this patient with history of lung cancer, but are nonspecific. Consider further evaluation with PET-CT and biopsy for further diagnostic and  staging purposes. 2. New areas of volume loss, architectural distortion airspace consolidation in the left lower lobe, compatible with developing pneumonia or sequela of recent aspiration (this appears new compared to yesterday's examination). 3. Small bilateral  pleural effusions layering dependently, also new compared to yesterday's examination. 4. Chronic postradiation changes of mass-like fibrosis in the perihilar aspect of the right lung. 5. Additional incidental findings, as above.   Electronically Signed   By: Vinnie Langton M.D.   On: 08/17/2014 13:05   Mr Brain Wo Contrast  08/17/2014   CLINICAL DATA:  Acute encephalopathy. Development of altered mental status and forgetfulness over the past week. History of lung cancer.  EXAM: MRI HEAD WITHOUT CONTRAST  TECHNIQUE: Multiplanar, multiecho pulse sequences of the brain and surrounding structures were obtained without intravenous contrast.  COMPARISON:  12/31/2010  FINDINGS: Due to patient's altered mental status and inability to follow instructions, images are overall moderately degraded by motion artifact despite using more motion resistant imaging protocols. Due to patient's mental status, the examination was terminated prematurely, and axial T1 and coronal T2 weighted images were not obtained.  There is no evidence of acute infarct, intracranial hemorrhage, mass, midline shift, or extra-axial fluid collection. There is mild to moderate cerebral atrophy. Small foci of T2 hyperintensity in the cerebral white matter are grossly unchanged from the prior study and nonspecific but compatible with mild chronic small vessel ischemic disease. 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  Orbits are unremarkable. There is mild left maxillary sinus mucosal thickening. Mastoid air cells are clear. Distal left vertebral artery flow void again appears small, similar to prior. Other  major intracranial vascular flow voids are preserved.  IMPRESSION: 1. Motion degraded, incomplete examination as above. No acute infarct. 2. Mild chronic small vessel ischemic disease and chronic lacunar infarcts as above.   Electronically Signed   By: Logan Bores   On: 08/17/2014 14:40   Dg Chest Port 1 View  08/16/2014   CLINICAL DATA:  Initial evaluation for tachycardia, history of hypertension and atrial fibrillation as well as lung cancer  EXAM: PORTABLE CHEST - 1 VIEW  COMPARISON:  10/23/2013  FINDINGS: Heart size and vascular pattern are normal. Left lung is clear. Abnormal opacity right perihilar region consistent with radiation therapy change as described on prior CT scan. Right Port-A-Cath noted. Right lung otherwise clear.  IMPRESSION: No acute findings. Evidence of radiation change right parahilar region.   Electronically Signed   By: Skipper Cliche M.D.   On: 08/16/2014 19:07         Subjective: Patient is pleasantly confused but denies fevers, chills, chest pain, shortness breath, nausea, vomiting, diarrhea, abdominal pain.  Objective: Filed Vitals:   08/17/14 2144 08/17/14 2151 08/18/14 0608 08/18/14 0625  BP: 126/100 120/84 110/82   Pulse: 117  116   Temp: 98.3 F (36.8 C)  98.3 F (36.8 C)   TempSrc: Oral  Axillary   Resp: 16  26 20   Height:      Weight:      SpO2: 100%  100%     Intake/Output Summary (Last 24 hours) at 08/18/14 0756 Last data filed at 08/17/14 2247  Gross per 24 hour  Intake 2079.92 ml  Output      0 ml  Net 2079.92 ml   Weight change:  Exam:   General:  Pt is alert, follows commands appropriately, not in acute distress  HEENT: No icterus, No thrush, [place]/AT  Cardiovascular: RRR, S1/S2, no rubs,+S4  Respiratory: CTA bilaterally, no wheezing, no crackles, no rhonchi  Abdomen: Soft/+BS, non tender, non distended, no guarding  Extremities: No edema, No  lymphangitis, No petechiae, No rashes, no synovitis  Data Reviewed: Basic  Metabolic Panel:  Recent Labs Lab 08/13/14 0855 08/16/14 1607 08/17/14 0310 08/18/14 0550  NA 141 138 140 139  K 2.9* 5.4* 5.0 4.5  CL  --  112 117* 116*  CO2 18* 15* 15* 13*  GLUCOSE 113 115* 101* 157*  BUN 31.4* 30* 28* 30*  CREATININE 1.8* 2.00* 1.78* 2.01*  CALCIUM 9.1 9.4 8.4 8.8   Liver Function Tests:  Recent Labs Lab 08/13/14 0855 08/16/14 1607  AST 12 16  ALT 10 13  ALKPHOS 71 64  BILITOT 0.95 1.5*  PROT 6.8 6.8  ALBUMIN 2.6* 2.8*   No results for input(s): LIPASE, AMYLASE in the last 168 hours.  Recent Labs Lab 08/17/14 1025  AMMONIA 13   CBC:  Recent Labs Lab 08/13/14 0855 08/16/14 1607 08/17/14 0310 08/18/14 0550  WBC 10.7* 9.2 10.8* 13.5*  NEUTROABS 9.0* 8.2*  --   --   HGB 8.5* 8.5* 6.9* 11.9*  HCT 27.2* 25.9* 21.3* 34.3*  MCV 97.6 92.5 89.9 84.3  PLT 186 212 194 PENDING   Cardiac Enzymes:  Recent Labs Lab 08/16/14 1825 08/16/14 2143 08/17/14 0310 08/17/14 1025  TROPONINI 0.19* 0.19* 0.23* 0.28*   BNP: Invalid input(s): POCBNP CBG: No results for input(s): GLUCAP in the last 168 hours.  Recent Results (from the past 240 hour(s))  Urine culture     Status: None   Collection Time: 08/16/14  2:32 PM  Result Value Ref Range Status   Specimen Description URINE, CATHETERIZED  Final   Special Requests NONE  Final   Colony Count   Final    95,000 COLONIES/ML Performed at [facility]    Culture   Final    Multiple bacterial morphotypes present, none predominant. Suggest appropriate recollection if clinically indicated. Performed at [facility]    Report Status 08/18/2014 FINAL  Final     Scheduled Meds: . antiseptic oral rinse  7 mL Mouth Rinse BID  . ceFEPime (MAXIPIME) IV  1 g Intravenous Q24H  . folic acid  1 mg Oral Daily  . heparin  5,000 Units Subcutaneous 3 times per day  . loratadine  10 mg Oral Daily  . metronidazole  500 mg Intravenous Q8H  . sodium chloride  3 mL Intravenous Q12H    Continuous Infusions:    Massey Ruhland, DO  [facility] Pager [phone]  If 7PM-7AM, please contact night-coverage [email or web address] Password [email or web address] 08/18/2014, 7:56 AM   LOS: 2 days

## 2014-08-18 NOTE — Consult Note (Addendum)
CONSULTATION NOTE  Reason for Consult: Elevated troponin  Requesting Physician: Dr. Carles Collet  Cardiologist: Burt Knack  HPI: This is a 69 y.o. female with a past medical history significant for stage III lung cancer with possible recurrence, hypertension, atrial fibrillation,and CKD3, who presents with altered mental status and forgiveness as for over a week. She's had unexplained weight loss, diarrhea and flank pain. She was also found to have significant pyuria on exam. This is consistent with urinary tract infection. A CT of the abdomen and pelvis did show a 3.5 x 3 cm necrotic lymph node. She was found to be significantly anemic and transfused 2 units of packed red blood cells. Troponin was checked and mildly elevated. The troponin trend is as follows: 0.14, 0.19, 0.19, 0.23, 0.28, 0.16.  She does have some mild encephalopathy, but denies any chest pain. 2-D echo was ordered but not yet performed. EKG shows persistent anterolateral T-wave inversions which are new compared to her most recent office visit in 2015. Cardiology is asked to consult regarding elevated troponin and abnormal EKG.   PMHx:  Past Medical History  Diagnosis Date  . Hypertension   . Anemia   . Atrial fibrillation   . Chronic kidney disease   . lung ca     lung ca  . Lung cancer 11/11/10   Past Surgical History  Procedure Laterality Date  . Port a cath placement      FAMHx: Family History  Problem Relation Age of Onset  . Diabetes Mother   . Heart attack Father   . Hypertension Father   . Cancer Sister   . Stroke Brother   . Hypertension Brother   . Heart attack Brother   . Hypertension Brother     SOCHx:  reports that she quit smoking about 3 years ago. Her smoking use included Cigarettes. She has a 40 pack-year smoking history. She has never used smokeless tobacco. She reports that she does not drink alcohol or use illicit drugs.  ALLERGIES: Allergies  Allergen Reactions  . Lisinopril Swelling    Makes face swell    ROS: Review of systems not obtained due to patient factors.  HOME MEDICATIONS: Prescriptions prior to admission  Medication Sig Dispense Refill Last Dose  . diltiazem (CARDIZEM CD) 180 MG 24 hr capsule Take 1 capsule (180 mg total) by mouth daily. 90 capsule 3 08/15/2014 at Unknown time  . folic acid (FOLVITE) 1 MG tablet Take 1 mg by mouth daily.   08/15/2014 at Unknown time  . lidocaine-prilocaine (EMLA) cream Apply 1 application topically as needed. For port-a-cath access.   unknown  . loratadine (CLARITIN) 10 MG tablet Take 10 mg by mouth daily.     08/15/2014 at Unknown time  . methylPREDNIsolone (MEDROL DOSPACK) 4 MG tablet follow package directions 21 tablet 0 08/15/2014 at Unknown time  . potassium chloride SA (K-DUR,KLOR-CON) 20 MEQ tablet Take 2 tablets (40 mEq total) by mouth 2 (two) times daily. 7 tablet 0 08/15/2014 at Unknown time  . Probiotic Product (PROBIOTIC PO) Take 1 tablet by mouth daily.   08/15/2014 at Unknown time    HOSPITAL MEDICATIONS: Scheduled: . antiseptic oral rinse  7 mL Mouth Rinse BID  . ceFEPime (MAXIPIME) IV  1 g Intravenous Q24H  . folic acid  1 mg Oral Daily  . heparin  5,000 Units Subcutaneous 3 times per day  . loratadine  10 mg Oral Daily  . metronidazole  500 mg Intravenous Q8H  . sodium chloride  3 mL Intravenous Q12H    VITALS: Blood pressure 92/62, pulse 116, temperature 98.6 F (37 C), temperature source Axillary, resp. rate 16, height 5' 6"  (1.676 m), weight 102 lb 9.6 oz (46.539 kg), SpO2 100 %.  PHYSICAL EXAM: General appearance: alert, appears older than stated age and frail Neck: JVD - 3 cm above sternal notch and no carotid bruit Lungs: diminished breath sounds bilaterally Heart: regular rate and rhythm, S1, S2 normal, S3 present and 2/6 SEM at apex Abdomen: soft, non-tender; bowel sounds normal; no masses,  no organomegaly Extremities: extremities normal, atraumatic, no cyanosis or edema Pulses: 2+ and  symmetric Skin: Skin color, texture, turgor normal. No rashes or lesions Neurologic: Mental status: Awake, not oriented to place or year .  LABS: Results for orders placed or performed during the hospital encounter of 08/16/14 (from the past 48 hour(s))  Urinalysis, Routine w reflex microscopic     Status: Abnormal   Collection Time: 08/16/14  2:32 PM  Result Value Ref Range   Color, Urine YELLOW YELLOW   APPearance CLOUDY (A) CLEAR   Specific Gravity, Urine 1.015 1.005 - 1.030   pH 5.5 5.0 - 8.0   Glucose, UA NEGATIVE NEGATIVE mg/dL   Hgb urine dipstick TRACE (A) NEGATIVE   Bilirubin Urine MODERATE (A) NEGATIVE   Ketones, ur 15 (A) NEGATIVE mg/dL   Protein, ur NEGATIVE NEGATIVE mg/dL   Urobilinogen, UA 2.0 (H) 0.0 - 1.0 mg/dL   Nitrite NEGATIVE NEGATIVE   Leukocytes, UA LARGE (A) NEGATIVE  Urine culture     Status: None   Collection Time: 08/16/14  2:32 PM  Result Value Ref Range   Specimen Description URINE, CATHETERIZED    Special Requests NONE    Colony Count      95,000 COLONIES/ML Performed at [facility]    Culture      Multiple bacterial morphotypes present, none predominant. Suggest appropriate recollection if clinically indicated. Performed at [facility]    Report Status 08/18/2014 FINAL   Urine microscopic-add on     Status: Abnormal   Collection Time: 08/16/14  2:32 PM  Result Value Ref Range   WBC, UA 21-50 <3 WBC/hpf   Bacteria, UA MANY (A) RARE   Urine-Other LESS THAN 10 mL OF URINE SUBMITTED     Comment: MICROSCOPIC EXAM PERFORMED ON UNCONCENTRATED URINE  Na and K (sodium & potassium), rand urine     Status: None   Collection Time: 08/16/14  2:32 PM  Result Value Ref Range   Sodium, Ur 17 mmol/L   Potassium Urine Timed 45 mmol/L  CBC with Differential     Status: Abnormal   Collection Time: 08/16/14  4:07 PM  Result Value Ref Range   WBC 9.2 4.0 - 10.5 K/uL   RBC 2.80 (L) 3.87 - 5.11 MIL/uL   Hemoglobin 8.5 (L) 12.0 - 15.0 g/dL    HCT 25.9 (L) 36.0 - 46.0 %   MCV 92.5 78.0 - 100.0 fL   MCH 30.4 26.0 - 34.0 pg   MCHC 32.8 30.0 - 36.0 g/dL   RDW 19.5 (H) 11.5 - 15.5 %   Platelets 212 150 - 400 K/uL   Neutrophils Relative % 89 (H) 43 - 77 %   Neutro Abs 8.2 (H) 1.7 - 7.7 K/uL   Lymphocytes Relative 6 (L) 12 - 46 %   Lymphs Abs 0.6 (L) 0.7 - 4.0 K/uL   Monocytes Relative 5 3 - 12 %   Monocytes Absolute 0.4 0.1 -  1.0 K/uL   Eosinophils Relative 0 0 - 5 %   Eosinophils Absolute 0.0 0.0 - 0.7 K/uL   Basophils Relative 0 0 - 1 %   Basophils Absolute 0.0 0.0 - 0.1 K/uL  Comprehensive metabolic panel     Status: Abnormal   Collection Time: 08/16/14  4:07 PM  Result Value Ref Range   Sodium 138 135 - 145 mmol/L    Comment: Please note change in reference range.   Potassium 5.4 (H) 3.5 - 5.1 mmol/L    Comment: Please note change in reference range.   Chloride 112 96 - 112 mEq/L   CO2 15 (L) 19 - 32 mmol/L   Glucose, Bld 115 (H) 70 - 99 mg/dL   BUN 30 (H) 6 - 23 mg/dL   Creatinine, Ser 2.00 (H) 0.50 - 1.10 mg/dL   Calcium 9.4 8.4 - 10.5 mg/dL   Total Protein 6.8 6.0 - 8.3 g/dL   Albumin 2.8 (L) 3.5 - 5.2 g/dL   AST 16 0 - 37 U/L   ALT 13 0 - 35 U/L   Alkaline Phosphatase 64 39 - 117 U/L   Total Bilirubin 1.5 (H) 0.3 - 1.2 mg/dL   GFR calc non Af Amer 24 (L) >90 mL/min   GFR calc Af Amer 28 (L) >90 mL/min    Comment: (NOTE) The eGFR has been calculated using the CKD EPI equation. This calculation has not been validated in all clinical situations. eGFR's persistently <90 mL/min signify possible Chronic Kidney Disease.    Anion gap 11 5 - 15  Brain natriuretic peptide     Status: Abnormal   Collection Time: 08/16/14  4:12 PM  Result Value Ref Range   B Natriuretic Peptide 4041.2 (H) 0.0 - 100.0 pg/mL    Comment: Please note change in reference range.  I-stat troponin, ED     Status: Abnormal   Collection Time: 08/16/14  4:57 PM  Result Value Ref Range   Troponin i, poc 0.14 (HH) 0.00 - 0.08 ng/mL    Comment NOTIFIED PHYSICIAN    Comment 3            Comment: Due to the release kinetics of cTnI, a negative result within the first hours of the onset of symptoms does not rule out myocardial infarction with certainty. If myocardial infarction is still suspected, repeat the test at appropriate intervals.   Troponin I     Status: Abnormal   Collection Time: 08/16/14  6:25 PM  Result Value Ref Range   Troponin I 0.19 (H) <0.031 ng/mL    Comment:        PERSISTENTLY INCREASED TROPONIN VALUES IN THE RANGE OF 0.04-0.49 ng/mL CAN BE SEEN IN:       -UNSTABLE ANGINA       -CONGESTIVE HEART FAILURE       -MYOCARDITIS       -CHEST TRAUMA       -ARRYHTHMIAS       -LATE PRESENTING MYOCARDIAL INFARCTION       -COPD   CLINICAL FOLLOW-UP RECOMMENDED. Please note change in reference range.   Troponin I (q 6hr x 3)     Status: Abnormal   Collection Time: 08/16/14  9:43 PM  Result Value Ref Range   Troponin I 0.19 (H) <0.031 ng/mL    Comment:        PERSISTENTLY INCREASED TROPONIN VALUES IN THE RANGE OF 0.04-0.49 ng/mL CAN BE SEEN IN:       -UNSTABLE ANGINA       -  CONGESTIVE HEART FAILURE       -MYOCARDITIS       -CHEST TRAUMA       -ARRYHTHMIAS       -LATE PRESENTING MYOCARDIAL INFARCTION       -COPD   CLINICAL FOLLOW-UP RECOMMENDED. Please note change in reference range.   CBC     Status: Abnormal   Collection Time: 08/17/14  3:10 AM  Result Value Ref Range   WBC 10.8 (H) 4.0 - 10.5 K/uL   RBC 2.37 (L) 3.87 - 5.11 MIL/uL   Hemoglobin 6.9 (LL) 12.0 - 15.0 g/dL    Comment: REPEATED TO VERIFY CRITICAL RESULT CALLED TO, READ BACK BY AND VERIFIED WITH: A THOMPSON,RN 08/17/14 0332 RHOLMES    HCT 21.3 (L) 36.0 - 46.0 %   MCV 89.9 78.0 - 100.0 fL   MCH 29.1 26.0 - 34.0 pg   MCHC 32.4 30.0 - 36.0 g/dL   RDW 20.2 (H) 11.5 - 15.5 %   Platelets 194 150 - 400 K/uL    Comment: REPEATED TO VERIFY PLATELET COUNT CONFIRMED BY SMEAR   Basic metabolic panel     Status: Abnormal    Collection Time: 08/17/14  3:10 AM  Result Value Ref Range   Sodium 140 135 - 145 mmol/L    Comment: Please note change in reference range.   Potassium 5.0 3.5 - 5.1 mmol/L    Comment: Please note change in reference range.   Chloride 117 (H) 96 - 112 mEq/L   CO2 15 (L) 19 - 32 mmol/L   Glucose, Bld 101 (H) 70 - 99 mg/dL   BUN 28 (H) 6 - 23 mg/dL   Creatinine, Ser 1.78 (H) 0.50 - 1.10 mg/dL   Calcium 8.4 8.4 - 10.5 mg/dL   GFR calc non Af Amer 28 (L) >90 mL/min   GFR calc Af Amer 32 (L) >90 mL/min    Comment: (NOTE) The eGFR has been calculated using the CKD EPI equation. This calculation has not been validated in all clinical situations. eGFR's persistently <90 mL/min signify possible Chronic Kidney Disease.    Anion gap 8 5 - 15  Troponin I (q 6hr x 3)     Status: Abnormal   Collection Time: 08/17/14  3:10 AM  Result Value Ref Range   Troponin I 0.23 (H) <0.031 ng/mL    Comment:        PERSISTENTLY INCREASED TROPONIN VALUES IN THE RANGE OF 0.04-0.49 ng/mL CAN BE SEEN IN:       -UNSTABLE ANGINA       -CONGESTIVE HEART FAILURE       -MYOCARDITIS       -CHEST TRAUMA       -ARRYHTHMIAS       -LATE PRESENTING MYOCARDIAL INFARCTION       -COPD   CLINICAL FOLLOW-UP RECOMMENDED. Please note change in reference range.   Prepare RBC     Status: None   Collection Time: 08/17/14  8:30 AM  Result Value Ref Range   Order Confirmation ORDER PROCESSED BY BLOOD BANK   Troponin I (q 6hr x 3)     Status: Abnormal   Collection Time: 08/17/14 10:25 AM  Result Value Ref Range   Troponin I 0.28 (H) <0.031 ng/mL    Comment:        PERSISTENTLY INCREASED TROPONIN VALUES IN THE RANGE OF 0.04-0.49 ng/mL CAN BE SEEN IN:       -UNSTABLE ANGINA       -CONGESTIVE HEART  FAILURE       -MYOCARDITIS       -CHEST TRAUMA       -ARRYHTHMIAS       -LATE PRESENTING MYOCARDIAL INFARCTION       -COPD   CLINICAL FOLLOW-UP RECOMMENDED. Please note change in reference range.   Vitamin B12      Status: None   Collection Time: 08/17/14 10:25 AM  Result Value Ref Range   Vitamin B-12 492 211 - 911 pg/mL    Comment: Performed at [facility]  TSH     Status: None   Collection Time: 08/17/14 10:25 AM  Result Value Ref Range   TSH 2.900 0.350 - 4.500 uIU/mL  Lactic acid, plasma     Status: None   Collection Time: 08/17/14 10:25 AM  Result Value Ref Range   Lactic Acid, Venous 1.5 0.5 - 2.2 mmol/L  Ammonia     Status: None   Collection Time: 08/17/14 10:25 AM  Result Value Ref Range   Ammonia 13 11 - 32 umol/L    Comment: Please note change in reference range.  Type and screen for Red Blood Exchange     Status: None   Collection Time: 08/17/14 10:25 AM  Result Value Ref Range   ABO/RH(D) O POS    Antibody Screen NEG    Sample Expiration 08/20/2014    Unit Number [id number]    Blood Component Type RED CELLS,LR    Unit division 00    Status of Unit ISSUED,FINAL    Transfusion Status OK TO TRANSFUSE    Crossmatch Result Compatible    Unit Number [id number]    Blood Component Type RED CELLS,LR    Unit division 00    Status of Unit ISSUED,FINAL    Transfusion Status OK TO TRANSFUSE    Crossmatch Result Compatible   Basic metabolic panel     Status: Abnormal   Collection Time: 08/18/14  5:50 AM  Result Value Ref Range   Sodium 139 135 - 145 mmol/L    Comment: Please note change in reference range.   Potassium 4.5 3.5 - 5.1 mmol/L    Comment: Please note change in reference range.   Chloride 116 (H) 96 - 112 mEq/L   CO2 13 (L) 19 - 32 mmol/L   Glucose, Bld 157 (H) 70 - 99 mg/dL   BUN 30 (H) 6 - 23 mg/dL   Creatinine, Ser 2.01 (H) 0.50 - 1.10 mg/dL   Calcium 8.8 8.4 - 10.5 mg/dL   GFR calc non Af Amer 24 (L) >90 mL/min   GFR calc Af Amer 28 (L) >90 mL/min    Comment: (NOTE) The eGFR has been calculated using the CKD EPI equation. This calculation has not been validated in all clinical situations. eGFR's persistently <90 mL/min signify possible Chronic  Kidney Disease.    Anion gap 10 5 - 15  CBC     Status: Abnormal   Collection Time: 08/18/14  5:50 AM  Result Value Ref Range   WBC 13.5 (H) 4.0 - 10.5 K/uL    Comment: REPEATED TO VERIFY   RBC 4.07 3.87 - 5.11 MIL/uL   Hemoglobin 11.9 (L) 12.0 - 15.0 g/dL    Comment: REPEATED TO VERIFY POST TRANSFUSION SPECIMEN    HCT 34.3 (L) 36.0 - 46.0 %   MCV 84.3 78.0 - 100.0 fL   MCH 29.2 26.0 - 34.0 pg   MCHC 34.7 30.0 - 36.0 g/dL   RDW 19.1 (H) 11.5 -  15.5 %   Platelets  150 - 400 K/uL    PLATELET CLUMPS NOTED ON SMEAR, COUNT APPEARS DECREASED  Troponin I     Status: Abnormal   Collection Time: 08/18/14  9:00 AM  Result Value Ref Range   Troponin I 0.16 (H) <0.031 ng/mL    Comment:        PERSISTENTLY INCREASED TROPONIN VALUES IN THE RANGE OF 0.04-0.49 ng/mL CAN BE SEEN IN:       -UNSTABLE ANGINA       -CONGESTIVE HEART FAILURE       -MYOCARDITIS       -CHEST TRAUMA       -ARRYHTHMIAS       -LATE PRESENTING MYOCARDIAL INFARCTION       -COPD   CLINICAL FOLLOW-UP RECOMMENDED. Please note change in reference range.     IMAGING: Ct Abdomen Pelvis Wo Contrast  08/16/2014   CLINICAL DATA:  Low back/right flank pain x3 days, history of lung cancer  EXAM: CT ABDOMEN AND PELVIS WITHOUT CONTRAST  TECHNIQUE: Multidetector CT imaging of the abdomen and pelvis was performed following the standard protocol without IV contrast.  COMPARISON:  CT abdomen pelvis dated 04/19/2012. Partial comparison to CT chest dated 10/23/2013.  FINDINGS: Lower chest:  Left lower lobe scarring/atelectasis.  3.5 x 3.0 cm lesion adjacent to the descending thoracic aorta (series 21/ image 1), measuring at the upper limits of normal for fluid density, incompletely visualized but new from prior CT. This appearance is worrisome for an necrotic node.  Hepatobiliary: Liver is unremarkable.  Gallbladder is notable for layering gallstones (series 21/ image 23). No associated inflammatory changes. No intrahepatic or  extrahepatic ductal dilatation.  Pancreas: Within normal limits.  Spleen: Within normal limits.  Adrenals/Urinary Tract: Adrenal glands are unremarkable.  Kidneys are grossly unremarkable, noting a 10 mm probable cyst along the right lower kidney (series 21/image 25). No renal calculi or hydronephrosis.  No ureteral or bladder calculi.  Bladder is unremarkable.  Stomach/Bowel: Stomach is notable for a small hiatal or paraesophageal hernia (Series 21/ image 7).  No evidence of bowel obstruction.  Normal appendix.  Colonic diverticulosis, without associated inflammatory changes.  Vascular/Lymphatic: Atherosclerotic calcifications of the abdominal aorta and branch vessels.  1.8 cm short axis gastrohepatic node (series 21/ image 13), new.  Reproductive: Uterus is unremarkable.  No adnexal masses.  Other: No abdominopelvic ascites.  Musculoskeletal: Degenerative changes of the visualized thoracolumbar spine.  Mild superior endplate compression fracture deformity at L1, unchanged from prior CT chest.  IMPRESSION: No evidence of bowel obstruction.  Normal appendix.  Cholelithiasis, without associated inflammatory changes.  No renal, ureteral, or bladder calculi.  3.5 x 3.0 cm suspected necrotic node adjacent to the descending thoracic aorta, incompletely visualized. Additional 1.8 cm short axis gastrohepatic node. These are new from prior CT and suspicious for nodal metastases.  Consider dedicated CT of the chest for further evaluation.   Electronically Signed   By: Julian Hy M.D.   On: 08/16/2014 18:12   Ct Chest Wo Contrast  08/17/2014   CLINICAL DATA:  69 year old female with history of squamous cell carcinoma of the lung.  EXAM: CT CHEST WITHOUT CONTRAST  TECHNIQUE: Multidetector CT imaging of the chest was performed following the standard protocol without IV contrast.  COMPARISON:  Chest CT 10/23/2013.  FINDINGS: Mediastinum/Lymph Nodes: Right internal jugular single-lumen porta cath with tip terminating at  the superior cavoatrial junction. Heart size is normal. Trace amount of pericardial fluid and/or thickening, unlikely to  be of any hemodynamic significance at this time. No associated pericardial calcification. 4.1 x 2.7 cm soft tissue attenuation lesion in the middle mediastinum abutting the left side of the descending thoracic aorta (image 36 of series 201), and in close contact with the distal third of the esophagus.  Lungs/Pleura: Chronic postradiation changes of mass-like fibrosis in the right perihilar region are unchanged. Small bilateral pleural effusions are new compared to the prior examination. Extensive volume loss, architectural distortion and airspace consolidation in the basal segments of the left lower lobe. Scattered micronodules in the lungs bilaterally, similar to the prior examination, with the largest of these clustered in the anterior aspect of the right upper lobe, similar to the prior examination, favored to reflect predominantly areas of mucoid impaction.  Musculoskeletal/Soft Tissues: Multiple old vertebral body compression fractures, unchanged compared to the prior examination, involving predominantly the superior endplates of T2, T7, K93, T12 and L1, most severe at L1 where there is approximately 15% loss of anterior vertebral body height. There are no aggressive appearing lytic or blastic lesions noted in the visualized portions of the skeleton.  Upper Abdomen: Enlarged gastrohepatic ligament lymph node measuring 1.9 cm in short axis.  IMPRESSION: 1. Large soft tissue mass in the lower middle mediastinum measuring up to 4.1 x 2.7 cm, likely to represent an enlarged lymph node. Given the gastrohepatic ligament lymphadenopathy, findings are concerning for potential nodal spread of disease in this patient with history of lung cancer, but are nonspecific. Consider further evaluation with PET-CT and biopsy for further diagnostic and staging purposes. 2. New areas of volume loss, architectural  distortion airspace consolidation in the left lower lobe, compatible with developing pneumonia or sequela of recent aspiration (this appears new compared to yesterday's examination). 3. Small bilateral pleural effusions layering dependently, also new compared to yesterday's examination. 4. Chronic postradiation changes of mass-like fibrosis in the perihilar aspect of the right lung. 5. Additional incidental findings, as above.   Electronically Signed   By: Vinnie Langton M.D.   On: 08/17/2014 13:05   Mr Brain Wo Contrast  08/17/2014   CLINICAL DATA:  Acute encephalopathy. Development of altered mental status and forgetfulness over the past week. History of lung cancer.  EXAM: MRI HEAD WITHOUT CONTRAST  TECHNIQUE: Multiplanar, multiecho pulse sequences of the brain and surrounding structures were obtained without intravenous contrast.  COMPARISON:  12/31/2010  FINDINGS: Due to patient's altered mental status and inability to follow instructions, images are overall moderately degraded by motion artifact despite using more motion resistant imaging protocols. Due to patient's mental status, the examination was terminated prematurely, and axial T1 and coronal T2 weighted images were not obtained.  There is no evidence of acute infarct, intracranial hemorrhage, mass, midline shift, or extra-axial fluid collection. There is mild to moderate cerebral atrophy. Small foci of T2 hyperintensity in the cerebral white matter are grossly unchanged from the prior study and nonspecific but compatible with mild chronic small vessel ischemic disease. 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  Orbits are unremarkable. There is mild left maxillary sinus mucosal thickening. Mastoid air cells are clear. Distal left vertebral artery flow void again appears small, similar to prior. Other major intracranial vascular flow voids are preserved.   IMPRESSION: 1. Motion degraded, incomplete examination as above. No acute infarct. 2. Mild chronic small vessel ischemic disease and chronic lacunar infarcts as above.   Electronically Signed  By: Logan Bores   On: 08/17/2014 14:40   Dg Chest Port 1 View  08/16/2014   CLINICAL DATA:  Initial evaluation for tachycardia, history of hypertension and atrial fibrillation as well as lung cancer  EXAM: PORTABLE CHEST - 1 VIEW  COMPARISON:  10/23/2013  FINDINGS: Heart size and vascular pattern are normal. Left lung is clear. Abnormal opacity right perihilar region consistent with radiation therapy change as described on prior CT scan. Right Port-A-Cath noted. Right lung otherwise clear.  IMPRESSION: No acute findings. Evidence of radiation change right parahilar region.   Electronically Signed   By: Skipper Cliche M.D.   On: 08/16/2014 19:07   EKG: Sinus tachycardia with PACs, deep anterolateral T-wave inversions  HOSPITAL DIAGNOSES: Principal Problem:   Lung cancer Active Problems:   CKD (chronic kidney disease) stage 3, GFR 30-59 ml/min   PAF (paroxysmal atrial fibrillation)   Metabolic acidosis   UTI (lower urinary tract infection)   Hyperkalemia   Altered mental status   Acute encephalopathy   Tachycardia   Elevated troponin   Abnormal EKG   IMPRESSION: 1. Elevated troponin, secondary to cardiomyopathy 2. Acute systolic congestive heart failure - EF at bedside is approximately 15-20%, global HK with regional WMA's 3. CKD 3 or 4 4. History of PAF 5. H/o of Stage III lung CA with possible recurrence  RECOMMENDATION: 1. Acute systolic congestive heart failure-I was present for and personally reviewed a bedside echocardiogram which shows a new cardiomyopathy with EF around 20%. There is global hypokinesis with LAD territory wall motion abnormalities. This could represent multivessel coronary disease, possibly a nonischemic cardiomyopathy or even a Takatsubo cardiomyopathy. She denies any  chest pain or shortness of breath. BNP is elevated greater than 4000. Troponin has remained flat which is less likely to be related to acute coronary syndrome and more likely related to heart failure. Given her multiple comorbidities including a history of lung cancer with possible recurrence, stage III or 4 chronic kidney disease, urinary tract infection and recent anemia she is not a good candidate for cardiac catheterization or bypass surgery if she would be found to have multivessel coronary disease. Based on this, stress testing would be of little utility. I would recommend optimizing her heart failure medications and treating this medically. Her blood pressure is somewhat soft, therefore I recommend starting low-dose carvedilol 3.125 mg twice a day. Would also start aspirin 81 mg daily based on elevated troponins and the possibility of underlying coronary disease. She is net positive 2L - give lasix 40 mg IV x 1 now.  Will need additional diuresis based on creatinine response. 2. Stage III lung cancer-she will need restaging of her lung cancer and if it is found to have recurrent lung cancer, given her cardiomyopathy, hospice care should be considered 3. PAF-she was apparently treated with warfarin in 2012 however had no recurrence of A. fib and anticoagulation was stopped in 2013. Given possible recurrence of lung cancer and the fact that she was profoundly anemic on admission without a clear source, I do not feel that she is a good candidate for long-term warfarin. I would however Place her on 81 mg aspirin daily.  Thanks for consulting [place]. We will follow with you.  Time Spent Directly with Patient: 45 minutes  Pixie Casino, MD, [facility]  Attending Cardiologist [facility]  Acasia Skilton C 08/18/2014, 2:24 PM

## 2014-08-18 NOTE — Progress Notes (Signed)
Rosaria Ferries, PAC up on the floor seeing another pt.  Informed her that Dr. Debara Pickett had placed new orders for pt. Earlier for IV Lasix, but BP was low and Dr. Carles Collet also placed orders for 546ml NS bolus.  Informed Rhonda that RN started the bolus to see if BP would come up before giving Lasix & coreg.  Suanne Marker stated to hold off on the bolus and she text the MD from her phone.  Will continue to follow and await for clarification.  Alphonzo Lemmings, RN

## 2014-08-18 NOTE — Progress Notes (Signed)
INITIAL NUTRITION ASSESSMENT  DOCUMENTATION CODES Per approved criteria  -Severe malnutrition in the context of acute illness or injury   Pt meets criteria for severe MALNUTRITION in the context of acute illness as evidenced by moderate fat and muscle depletion, 20.9% wt loss x 3 months.  INTERVENTION: -RD to follow for diet advancement/ plan of care -Order Resource Breeze po TID, each supplement provides 250 kcal and 9 grams of protein, with diet advancement -RD to make further interventions based upon results of swallow study  NUTRITION DIAGNOSIS: Inadequate oral intake related to dysphagia as evidenced by NPO.   Goal: Pt will meet >90% of estimated nutritional  Monitor:  Diet advancement, PO/supplement intake, labs, weight changes, I/O's  Reason for Assessment: MST=2  69 y.o. female  Admitting Dx: Lung cancer  JAMYLA ARD is a 69 y.o. female brought in by family for development of AMS and forgetfulness over the past 1 week. Per family patient had trouble remembering an appointment she had with her oncologist on 08/13/13, she has also been complaining of unexplained weight loss, diarrhea, and flank pain. As a result they brought her in to the ED.  ASSESSMENT: Pt very drowsy at time of visit. Unable at participate in interview.  Spoke with nurse tech who reports diet is being held due to pt holding food in mouth this AM. Pt awaiting swallow eval.  Pt with hx of lung cancer, followed by [facility]. Noted pt has been seen by [facility] RD- noted prolonged poor po intake and poor acceptance of Boost and Ensure supplements. RD to order [employer] with diet advancement.  Per wt hx, pt has lost 27# (20.9%) wt loss x 3 months.  Labs reviewed. Cl: 116, CO2: 13, BUN/Creat:30/2.01, Glucose: 157.   Nutrition Focused Physical Exam:  Subcutaneous Fat:  Orbital Region: mild depletion Upper Arm Region: moderate depletion Thoracic and Lumbar Region: severe depletion  Muscle:  Temple Region:  moderate depletion Clavicle Bone Region: moderate depletion Clavicle and Acromion Bone Region: moderate depletion Scapular Bone Region: moderate depletion Dorsal Hand: moderate depletion Patellar Region: moderate depletion Anterior Thigh Region: moderate depletion Posterior Calf Region: moderate depletion  Edema: none present  Height: Ht Readings from Last 1 Encounters:  08/16/14 5\' 6"  (1.676 m)    Weight: Wt Readings from Last 1 Encounters:  08/16/14 102 lb 9.6 oz (46.539 kg)    Ideal Body Weight: 130#  % Ideal Body Weight: 78%  Wt Readings from Last 10 Encounters:  08/16/14 102 lb 9.6 oz (46.539 kg)  04/29/14 129 lb 1.6 oz (58.559 kg)  12/20/13 133 lb 12.8 oz (60.691 kg)  10/15/13 143 lb (64.864 kg)  08/07/13 139 lb 6.4 oz (63.231 kg)  05/30/13 146 lb 3.2 oz (66.316 kg)  04/04/13 142 lb 14.4 oz (64.819 kg)  02/05/13 149 lb 8 oz (67.813 kg)  11/26/12 147 lb 12.8 oz (67.042 kg)  09/27/12 147 lb 4.8 oz (66.815 kg)    Usual Body Weight: 147#  % Usual Body Weight: 69%  BMI:  Body mass index is 16.57 kg/(m^2). Underweight  Estimated Nutritional Needs: Kcal: 1400-1600 Protein: 65-75 grams Fluid: 1.4-1.6 L  Skin: cracked skin on rt and lt feet  Diet Order: Diet regular  EDUCATION NEEDS: -Education not appropriate at this time   Intake/Output Summary (Last 24 hours) at 08/18/14 1315 Last data filed at 08/18/14 1104  Gross per 24 hour  Intake 2079.92 ml  Output      0 ml  Net 2079.92 ml  Last BM: PTA  Labs:   Recent Labs Lab 08/16/14 1607 08/17/14 0310 08/18/14 0550  NA 138 140 139  K 5.4* 5.0 4.5  CL 112 117* 116*  CO2 15* 15* 13*  BUN 30* 28* 30*  CREATININE 2.00* 1.78* 2.01*  CALCIUM 9.4 8.4 8.8  GLUCOSE 115* 101* 157*    CBG (last 3)  No results for input(s): GLUCAP in the last 72 hours.  Scheduled Meds: . antiseptic oral rinse  7 mL Mouth Rinse BID  . ceFEPime (MAXIPIME) IV  1 g Intravenous Q24H  . folic acid  1 mg Oral Daily   . heparin  5,000 Units Subcutaneous 3 times per day  . loratadine  10 mg Oral Daily  . metronidazole  500 mg Intravenous Q8H  . sodium chloride  3 mL Intravenous Q12H    Continuous Infusions: . sodium chloride 75 mL/hr at 08/18/14 1049    Past Medical History  Diagnosis Date  . Hypertension   . Anemia   . Atrial fibrillation   . Chronic kidney disease   . lung ca     lung ca  . Lung cancer 11/11/10    Past Surgical History  Procedure Laterality Date  . Port a cath placement      Ioane Bhola A. Jimmye Norman, RD, LDN, CDE Pager: [phone] After hours Pager: [phone]

## 2014-08-18 NOTE — Progress Notes (Signed)
VSS - Blood pressure 92/62, pulse 116, temperature 98.6 F (37 C), temperature source Axillary, resp. rate 16, height 5\' 6"  (1.676 m), weight 46.539 kg (102 lb 9.6 oz), SpO2 100 %., R/A.  Re-checked manually and can only get SBP 88-90, not able to hear DBP.  Had 2 other RN's check also.  Dr. Carles Collet text paged.  Will continue to monitor and await for return call from MD.  Alphonzo Lemmings, RN

## 2014-08-18 NOTE — Progress Notes (Signed)
Still no clarification from previous orders today.  Text paged Dr. Elias Else with cardiology and informed of clarification needed for orders earlier, informed RN to contact primary MD to clarify since they were consulting.  Text paged Baltazar Najjar, Will await for return call.  Alphonzo Lemmings, RN

## 2014-08-18 NOTE — Progress Notes (Signed)
  Echocardiogram 2D Echocardiogram has been performed.  Diamond Nickel 08/18/2014, 3:41 PM

## 2014-08-18 NOTE — Progress Notes (Signed)
Return call from Baltazar Najjar, NP and informed of orders written earlier today and pt. BP then and now.  Baltazar Najjar stated she would review chart and call night RN back.  Will continue to monitor.  Alphonzo Lemmings, RN

## 2014-08-18 NOTE — Progress Notes (Signed)
PT Cancellation Note  Patient Details Name: Madison [name] MRN: [id number] DOB: 07-Sep-1945   Cancelled Treatment:    Reason Eval/Treat Not Completed: Medical issues which prohibited therapy. RN staff having difficulty getting BP reading on patient. Defer eval at this time and re-attempt as able.    Canary Brim [facility] 08/18/2014, 3:28 PM

## 2014-08-19 ENCOUNTER — Other Ambulatory Visit: Payer: 59

## 2014-08-19 ENCOUNTER — Inpatient Hospital Stay (HOSPITAL_COMMUNITY): Payer: Medicare Other

## 2014-08-19 DIAGNOSIS — E43 Unspecified severe protein-calorie malnutrition: Secondary | ICD-10-CM

## 2014-08-19 DIAGNOSIS — R571 Hypovolemic shock: Secondary | ICD-10-CM

## 2014-08-19 LAB — CBC
HCT: 33.1 % — ABNORMAL LOW (ref 36.0–46.0)
Hemoglobin: 11.5 g/dL — ABNORMAL LOW (ref 12.0–15.0)
MCH: 30 pg (ref 26.0–34.0)
MCHC: 34.7 g/dL (ref 30.0–36.0)
MCV: 86.4 fL (ref 78.0–100.0)
Platelets: 99 10*3/uL — ABNORMAL LOW (ref 150–400)
RBC: 3.83 MIL/uL — ABNORMAL LOW (ref 3.87–5.11)
RDW: 19.9 % — ABNORMAL HIGH (ref 11.5–15.5)
WBC: 11.1 10*3/uL — ABNORMAL HIGH (ref 4.0–10.5)

## 2014-08-19 LAB — FIBRINOGEN: Fibrinogen: 295 mg/dL (ref 204–475)

## 2014-08-19 LAB — BASIC METABOLIC PANEL
Anion gap: 7 (ref 5–15)
Anion gap: 6 (ref 5–15)
BUN: 36 mg/dL — ABNORMAL HIGH (ref 6–23)
BUN: 35 mg/dL — ABNORMAL HIGH (ref 6–23)
CALCIUM: 8.2 mg/dL — AB (ref 8.4–10.5)
CHLORIDE: 122 meq/L — AB (ref 96–112)
CO2: 12 mmol/L — AB (ref 19–32)
CO2: 13 mmol/L — ABNORMAL LOW (ref 19–32)
CREATININE: 2.41 mg/dL — AB (ref 0.50–1.10)
Calcium: 8.2 mg/dL — ABNORMAL LOW (ref 8.4–10.5)
Chloride: 118 mEq/L — ABNORMAL HIGH (ref 96–112)
Creatinine, Ser: 2.4 mg/dL — ABNORMAL HIGH (ref 0.50–1.10)
GFR calc Af Amer: 22 mL/min — ABNORMAL LOW (ref >90)
GFR calc non Af Amer: 19 mL/min — ABNORMAL LOW (ref >90)
GFR calc non Af Amer: 19 mL/min — ABNORMAL LOW (ref >90)
GFR, EST AFRICAN AMERICAN: 22 mL/min — AB (ref >90)
GLUCOSE: 120 mg/dL — AB (ref 70–99)
Glucose, Bld: 131 mg/dL — ABNORMAL HIGH (ref 70–99)
POTASSIUM: 3.9 mmol/L (ref 3.5–5.1)
Potassium: 5.3 mmol/L — ABNORMAL HIGH (ref 3.5–5.1)
Sodium: 137 mmol/L (ref 135–145)
Sodium: 141 mmol/L (ref 135–145)

## 2014-08-19 LAB — APTT: aPTT: 37 seconds (ref 24–37)

## 2014-08-19 LAB — PROTIME-INR
INR: 1.46 (ref 0.00–1.49)
PROTHROMBIN TIME: 17.9 s — AB (ref 11.6–15.2)

## 2014-08-19 LAB — CARBOXYHEMOGLOBIN
Carboxyhemoglobin: 0.2 % — ABNORMAL LOW (ref 0.5–1.5)
METHEMOGLOBIN: 1 % (ref 0.0–1.5)
O2 Saturation: 96.6 %
Total hemoglobin: 11 g/dL — ABNORMAL LOW (ref 12.0–16.0)

## 2014-08-19 LAB — PROCALCITONIN: Procalcitonin: 0.43 ng/mL

## 2014-08-19 LAB — MAGNESIUM: Magnesium: 1.1 mg/dL — ABNORMAL LOW (ref 1.5–2.5)

## 2014-08-19 LAB — LACTIC ACID, PLASMA
Lactic Acid, Venous: 2.4 mmol/L — ABNORMAL HIGH (ref 0.5–2.2)
Lactic Acid, Venous: 1.9 mmol/L (ref 0.5–2.2)

## 2014-08-19 LAB — MRSA PCR SCREENING: MRSA BY PCR: NEGATIVE

## 2014-08-19 LAB — GLUCOSE, CAPILLARY: Glucose-Capillary: 133 mg/dL — ABNORMAL HIGH (ref 70–99)

## 2014-08-19 MED ORDER — VANCOMYCIN HCL 500 MG IV SOLR
500.0000 mg | INTRAVENOUS | Status: DC
  Administered 2014-08-19 – 2014-08-21 (×2): 500 mg via INTRAVENOUS
  Filled 2014-08-19 (×2): qty 500

## 2014-08-19 MED ORDER — MAGNESIUM SULFATE 4 GM/100ML IV SOLN
4.0000 g | Freq: Once | INTRAVENOUS | Status: AC
Start: 2014-08-19 — End: 2014-08-19
  Administered 2014-08-19: 4 g via INTRAVENOUS
  Filled 2014-08-19: qty 100

## 2014-08-19 MED ORDER — SODIUM POLYSTYRENE SULFONATE 15 GM/60ML PO SUSP
15.0000 g | Freq: Once | ORAL | Status: AC
  Administered 2014-08-19: 15 g via ORAL
  Filled 2014-08-19: qty 60

## 2014-08-19 MED ORDER — DEXTROSE 5 % IV SOLN
INTRAVENOUS | Status: DC
  Administered 2014-08-19 – 2014-08-20 (×3): via INTRAVENOUS
  Filled 2014-08-19 (×7): qty 150

## 2014-08-19 MED ORDER — SODIUM CHLORIDE 0.9 % IV BOLUS (SEPSIS)
200.0000 mL | Freq: Once | INTRAVENOUS | Status: AC
Start: 2014-08-19 — End: 2014-08-19
  Administered 2014-08-19: 200 mL via INTRAVENOUS

## 2014-08-19 MED ORDER — SODIUM CHLORIDE 0.9 % IV BOLUS (SEPSIS)
500.0000 mL | Freq: Once | INTRAVENOUS | Status: AC
  Administered 2014-08-19: 500 mL via INTRAVENOUS

## 2014-08-19 MED ORDER — SODIUM BICARBONATE 650 MG PO TABS
1300.0000 mg | ORAL_TABLET | Freq: Two times a day (BID) | ORAL | Status: DC
  Administered 2014-08-19: 1300 mg via ORAL
  Filled 2014-08-19 (×2): qty 2

## 2014-08-19 MED ORDER — PIPERACILLIN-TAZOBACTAM IN DEX 2-0.25 GM/50ML IV SOLN
2.2500 g | Freq: Four times a day (QID) | INTRAVENOUS | Status: DC
  Administered 2014-08-19 – 2014-08-21 (×8): 2.25 g via INTRAVENOUS
  Filled 2014-08-19 (×11): qty 50

## 2014-08-19 MED ORDER — DIGOXIN 0.25 MG/ML IJ SOLN
0.1250 mg | Freq: Once | INTRAMUSCULAR | Status: AC
  Administered 2014-08-19: 0.125 mg via INTRAVENOUS
  Filled 2014-08-19: qty 0.5

## 2014-08-19 MED ORDER — FUROSEMIDE 10 MG/ML IJ SOLN: 60.0000 mg | Freq: Once | INTRAMUSCULAR | Status: DC

## 2014-08-19 NOTE — Progress Notes (Signed)
EKG showed A-fib with rapid ventricular response, ST & T wave abnormality, condering inferior ischemia, anterolateral ischemia, or digitalis.  MD TAT notified.  Forrestine Him, RN 08/19/14 at [phone]

## 2014-08-19 NOTE — Progress Notes (Signed)
Report given to [place], RN on 2 Heart to assume care of the patient.  Prior to transfer patient receive 200cc bolus of NS.  Patient transported by myself and Ramonita Lab, RN.   Forrestine Him, RN 08/19/14 1330

## 2014-08-19 NOTE — Progress Notes (Signed)
RN paged secondary to pt's rectal temp 95.46F. NP to bedside.  Pt is known to me by this morning's events. She says she feels well. She is baseline confused but answers questions and is alert. Skin is cool. BP and HR stable. She is in no acute distress. Respiratory effort normal. O2 sat normal on RA. Will apply bair hugger to raise temp to 97.5 F rectally. Will follow. Sons x 2 at bedside, updated. Clance Boll, NP [facility]

## 2014-08-19 NOTE — Progress Notes (Signed)
Pharmacist Heart Failure Core Measure Documentation  Assessment: Madison [name] has an EF documented as 20-25% on 08/18/13 by Echo.  Rationale: Heart failure patients with left ventricular systolic dysfunction (LVSD) and an EF < 40% should be prescribed an angiotensin converting enzyme inhibitor (ACEI) or angiotensin receptor blocker (ARB) at discharge unless a contraindication is documented in the medical record.  This patient is not currently on an ACEI or ARB for HF.  This note is being placed in the record in order to provide documentation that a contraindication to the use of these agents is present for this encounter.  ACE Inhibitor or Angiotensin Receptor Blocker is contraindicated (specify all that apply)  []   ACEI allergy AND ARB allergy []   Angioedema []   Moderate or severe aortic stenosis []   Hyperkalemia []   Hypotension []   Renal artery stenosis [x]   Worsening renal function, preexisting renal disease or dysfunction   Hildred Laser, Pharm D 08/19/2014 2:37 PM

## 2014-08-19 NOTE — Progress Notes (Signed)
RT was unable to draw blood gases, MD Tat notified at the bedside

## 2014-08-19 NOTE — Progress Notes (Signed)
Patient is ready for transport room check and all patient belonging sent with son Marjory Lies.

## 2014-08-19 NOTE — Progress Notes (Addendum)
RN paged NP at shift change to clarify some orders. RN stated that pt's BP was low this afternoon so attending ordered a 500cc bolus. Shortly after that, cardio had placed orders for Lasix and Coreg. Pt received approximately 1/3 of the 500cc bolus. At that time, pt's BP was around 808 systolic. Instead of 40mg  Lasix x 1, this NP changed the order to Lasix 20mg  IV q 6 x 2 doses and placed parameters for holding. Also, placed parameters on Coreg. Pt received one dose of 20mg  Lasix IV around 10pm 08/18/14 and one dose of Coreg. #2 dose of Lasix was held secondary to low BP.  This am, RN paged NP secondary to pt's HR sustaining 160s for about 20 minutes, in and out of Afib with ST. Pt's O2 sat was also low at that time but pt was in no respiratory distress. 12 lead EKG obtained and NP to bedside.  S: Pt says she just needs some rest. Is slightly SOB but denies any chest pain. Had some back pain earlier and is now OK. Per RN, pt has not had any change over baseline in mental status, c/o chest pain during shift, increased WOB or low O2 sat. RN placed pt on 2L.  O: Elderly AAF in NAD. Manual BP in 90s. Alert, appropriately responsive. EKG shows Afib with RVR. RR 16. O2 sat unable to be obtained due to pt's hands being cold. We attempted to get a forehead and ear SaO2 without success. Pt appears well. She has no increased WOB. No tachypnea. Lungs are CTA. Card: irreg irreg.  A/P: 1. Afib with RVR in pt with hx of Afib-cardio following. BP too low to give BB or Cardizem at present. Pt in no distress. Will try IV Digoxin and watch for now.  2. Hypoxia-doubt this was true reading. Pt looks well. Will leave on 2L per [place] for now and monitor. Still trying to get accurate SaO2.  3. Hypotension-trying to hold off on bolus given BP in 90s and pt has EF 20%. May need a small one. 4. CHF-Had one dose of Lasix and 2nd one held. BP not supporting this. Coreg was just started so haven't seen optimal HR effect yet. Cardiology  following.  Mg, BMP, CBC pending this am.  Clance Boll, NP [facility] Update: HR still up, BP low. 500cc bolus. O2 sat now 100%.  KJKG, NP

## 2014-08-19 NOTE — Progress Notes (Signed)
RT Note: Clinical specialist RT Shea Stakes was called to obtain blood from the patient and was assisted by myself to help hold patients arm. RT was unable to palpate a pulse so a doppler was utilized to locate artery but the stick was unsuccessful. Dr. Carles Collet arrived to patients room and we notified him that we had been unsuccessful so he is aware. Rt continued to try to obtain a spo2 via pulse oximetry but was unable to do so as well. Patient was stuck one time total by one respiratory therapist. Rt will continue to monitor and assist MD/RN as needed. Pt's RN was at bedside throughout.

## 2014-08-19 NOTE — Progress Notes (Signed)
[place] for vancomycin, zosyn Indication: PNA  Allergies  Allergen Reactions  . Lisinopril Swelling    Makes face swell    Patient Measurements: Height: 5\' 6"  (167.6 cm) Weight: 102 lb 9.6 oz (46.539 kg) IBW/kg (Calculated) : 59.3  Vital Signs: Temp Source: Axillary (01/12 1335) BP: 96/63 mmHg (01/12 1345) Pulse Rate: 63 (01/12 0501) Intake/Output from previous day: 01/11 0701 - 01/12 0700 In: 893.8 [P.O.:120; I.V.:773.8] Out: -  Intake/Output from this shift: Total I/O In: 200 [IV Piggyback:200] Out: -   Labs:  Recent Labs  08/17/14 0310 08/18/14 0550 08/19/14 0600  WBC 10.8* 13.5* 11.1*  HGB 6.9* 11.9* 11.5*  PLT 194 PLATELET CLUMPS NOTED ON SMEAR, COUNT APPEARS DECREASED 99*  CREATININE 1.78* 2.01* 2.40*   Estimated Creatinine Clearance: 16 mL/min (by C-G formula based on Cr of 2.4). No results for input(s): VANCOTROUGH, VANCOPEAK, VANCORANDOM, GENTTROUGH, GENTPEAK, GENTRANDOM, TOBRATROUGH, TOBRAPEAK, TOBRARND, AMIKACINPEAK, AMIKACINTROU, AMIKACIN in the last 72 hours.   Microbiology: Recent Results (from the past 720 hour(s))  Urine culture     Status: None   Collection Time: 08/16/14  2:32 PM  Result Value Ref Range Status   Specimen Description URINE, CATHETERIZED  Final   Special Requests NONE  Final   Colony Count   Final    95,000 COLONIES/ML Performed at [facility]    Culture   Final    Multiple bacterial morphotypes present, none predominant. Suggest appropriate recollection if clinically indicated. Performed at [facility]    Report Status 08/18/2014 FINAL  Final    Medical History: Past Medical History  Diagnosis Date  . Hypertension   . Anemia   . Atrial fibrillation   . Chronic kidney disease   . lung ca     lung ca  . Lung cancer 11/11/10    Medications:  Scheduled:  . antiseptic oral rinse  7 mL Mouth Rinse BID  . aspirin  81 mg Oral Daily  . enoxaparin (LOVENOX)  injection  30 mg Subcutaneous Q24H  . folic acid  1 mg Oral Daily  . piperacillin-tazobactam (ZOSYN)  IV  2.25 g Intravenous Q6H  . sodium chloride  500 mL Intravenous Once  . sodium chloride  3 mL Intravenous Q12H  . vancomycin  500 mg Intravenous Q48H    Assessment: 21 female on cefepime and flagyl for sepsis with PNA/UTI and to change to zosyn and vancomycin. WBC= 11.1, afebrile, SCr= 2.4 (baseline SCr is 1.8-2.0) and CrCl ~ 15.   Cefepime 1/10>> 1/12 Flagyl IV 1/10>> 1/12 vanc 1/12>> Zosyn 1/12>>  1/12 blood x2 1/9 Urine - 95 K/ml, multiple species  Goal of Therapy:  Vancomycin trough level 15-20 mcg/ml  Plan:  -Zosyn 2.25gm IV q6h -Vancomycin 500mg  IV q48h -Will follow renal function, cultures and clinical progress  Hildred Laser, Pharm D 08/19/2014 2:19 PM

## 2014-08-19 NOTE — Progress Notes (Signed)
Paged Baltazar Najjar NP concerning patients heart rate greater than 160 for 20 mins or more. Patient asymptomatic and alert on assessment. Denies any pain or discomfort. Orders to obtain stat EKG. Patient found to be in Afib with RVR. Baltazar Najjar NP paged back for follow-up and reported to the bedside to assess patient. O2 obtained from the the CNA states patient had a 02 level of 69 and low BP. Nurse notified late. Nurse applied 2L to the patient and obtained manual BP Unable to obtain an accurate level with two machines. Nurse and NP feels patient sats WNL based on assessment and patient in no distress. Further orders given. Will continue to monitor patient per shift

## 2014-08-19 NOTE — Progress Notes (Addendum)
PROGRESS NOTE  Madison [name]:[id number] DOB: Mar 23, 1946 DOA: 08/16/2014 PCP: Concha Norway, MD  Brief history 69 year old female with a history of squamous cell carcinoma of the lung previously treated with chemotherapy which she completed 07/14/2011, CKD, hypertension presented from home secondary to increasing forgetfulness and mental status change for one week. Apparently, the patient had had increasing difficulty remembering her appointments. There is also been loose stools, flank pain, and weight loss. The patient was found to have hyperkalemia with pyuria and sinus tachycardia in the emergency department. The patient's hemoglobin dropped to 6.9 on the subsequent day after admission. The patient was transfused with 2 units PRBC. She was admitted and started on intravenous fluids. CT of the abdomen and pelvis, which was confirmed by CT of the chest revealed a new 4.1 x 2.7 soft tissue mass in the lower mediastinum. This was suspicious for a new necrotic lymph node. Since admission, the patient had minimally elevated troponins with concerning EKG changes with T-wave inversion V3-V6.  Cardiology was consulted. The patient's blood pressure has been soft throughout the hospitalization and he mid 90s.  On the morning of 08/19/2014, the patient developed atrial fibrillation with RVR with systolic blood pressure in the 80s. The patient was transferred to stepdown unit.   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 Sonda Primes amiodarone vs cardioversion -given digoxin 0.125mg  at 0600 Hypotension -Likely multifactorial including Afib, possible infection -hold lasix until eval by cardiology -blood cultures x 2 sets -am cortisol -continue cefepime, flagyl -add vancomycin -check procalcitonin, lactic acid (lactate 1.5 on 08/18/14) Hypoxemia -check ABG--pulseox not registering well -PCXR--small bilateral pleural effusions  Pyuria/Bacteruria -Although the patient's urine culture did not show a single predominant organism, I will continue antibiotics given the Patient's clinical presentation and pyuria -Continue cefepime   Pulmonary infiltrate/consolidation -suspect a component of aspiration -RN reports pt is pooling food in mouth -Swallow evaluation  Elevated troponin -Due to demand ischemia, cardiomyopathy, in the setting of CKD -71repe EKG after RBC transfusion--still with significant T-wave inversions V3-V6-->consult cardiology for opinion/need for stress test -Patient does not have any chest pain or shortness of breath  Nongapped metabolic acidosis -partly due to CKD -start bicarbonate po  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 There was also left lower lobe volume loss with consolidation -Case discussed with pt's oncologist--Dr. Burnett Sheng stated pt can f/u as outpt--she already has an appt  Acute on Chronic Renal failure CKD stage III-4 -Baseline creatinine 1.8-2.1 -Judicious hydration -creatinine increased to 2.4 after one dose furosemide  Anemia -Multifactorial although  suspect component of chronic disease -Transfuse 2 units PRBC -I have personally performed rectal exam--no mass, FOBT--negative -08/13/2014 iron saturation 86% -B12, RBC folate  Hyperkalemia -Due to the patient's home potassium supplementation, CKD -Discontinue potassium supplement -08/19/14--give kayexalate  x 1 Hypomagnesemia -replete Family Communication: 08/19/14--SPOKE WITH SON EVERETT--[phone] Disposition Plan: Home when medically stable --08/19/14  Procedures/Studies: Ct Abdomen Pelvis Wo Contrast  08/16/2014   CLINICAL DATA:  Low back/right flank pain x3 days, history of lung cancer  EXAM: CT ABDOMEN AND PELVIS WITHOUT CONTRAST  TECHNIQUE: Multidetector CT imaging of the abdomen and pelvis was performed following the standard protocol without IV contrast.  COMPARISON:  CT abdomen pelvis dated 04/19/2012. Partial comparison to CT chest dated 10/23/2013.  FINDINGS: Lower chest:  Left lower lobe scarring/atelectasis.  3.5 x 3.0 cm lesion adjacent to the descending thoracic aorta (series 21/ image 1), measuring at the upper limits of normal for fluid density, incompletely visualized but new from prior CT. This appearance is worrisome for an necrotic node.  Hepatobiliary: Liver is unremarkable.  Gallbladder is notable for layering gallstones (series 21/ image 23). No associated inflammatory changes. No intrahepatic or extrahepatic ductal dilatation.  Pancreas: Within normal limits.  Spleen: Within normal limits.  Adrenals/Urinary Tract: Adrenal glands are unremarkable.  Kidneys are grossly unremarkable, noting a 10 mm probable cyst along the right lower kidney (series 21/image 25). No renal calculi or hydronephrosis.  No ureteral or bladder calculi.  Bladder is unremarkable.  Stomach/Bowel: Stomach is notable for a small hiatal or paraesophageal hernia (Series 21/ image 7).  No evidence of bowel obstruction.  Normal appendix.  Colonic diverticulosis, without associated inflammatory changes.   Vascular/Lymphatic: Atherosclerotic calcifications of the abdominal aorta and branch vessels.  1.8 cm short axis gastrohepatic node (series 21/ image 13), new.  Reproductive: Uterus is unremarkable.  No adnexal masses.  Other: No abdominopelvic ascites.  Musculoskeletal: Degenerative changes of the visualized thoracolumbar spine.  Mild superior endplate compression fracture deformity at L1, unchanged from prior CT chest.  IMPRESSION: No evidence of bowel obstruction.  Normal appendix.  Cholelithiasis, without associated inflammatory changes.  No renal, ureteral, or bladder calculi.  3.5 x 3.0 cm suspected necrotic node adjacent to the descending thoracic aorta, incompletely visualized. Additional 1.8 cm short axis gastrohepatic node. These are new from prior CT and suspicious for nodal metastases.  Consider dedicated CT of the chest for further evaluation.   Electronically Signed   By: Julian Hy M.D.   On: 08/16/2014 18:12   Ct Chest Wo Contrast  08/17/2014   CLINICAL DATA:  69 year old female with history of squamous cell carcinoma of the lung.  EXAM: CT CHEST WITHOUT CONTRAST  TECHNIQUE: Multidetector CT imaging of the chest was performed following the standard protocol without IV contrast.  COMPARISON:  Chest CT 10/23/2013.  FINDINGS: Mediastinum/Lymph Nodes: Right internal jugular single-lumen porta cath with tip terminating at the superior cavoatrial junction. Heart size is normal. Trace amount of pericardial fluid and/or thickening, unlikely to be of any hemodynamic significance at this time. No associated pericardial calcification. 4.1 x 2.7 cm soft tissue attenuation lesion in the middle mediastinum abutting the left side of the descending thoracic aorta (image 36 of series 201), and in close contact with the distal third of the esophagus.  Lungs/Pleura: Chronic postradiation changes of mass-like fibrosis in the right perihilar region are unchanged. Small bilateral pleural effusions are new  compared to the prior examination. Extensive volume loss, architectural distortion and airspace consolidation in the basal segments of the left lower lobe. Scattered micronodules in the lungs bilaterally, similar to the prior examination, with the largest of these clustered in the anterior aspect of the right upper lobe, similar to the prior examination, favored to reflect predominantly areas  of mucoid impaction.  Musculoskeletal/Soft Tissues: Multiple old vertebral body compression fractures, unchanged compared to the prior examination, involving predominantly the superior endplates of T2, T7, H08, T12 and L1, most severe at L1 where there is approximately 15% loss of anterior vertebral body height. There are no aggressive appearing lytic or blastic lesions noted in the visualized portions of the skeleton.  Upper Abdomen: Enlarged gastrohepatic ligament lymph node measuring 1.9 cm in short axis.  IMPRESSION: 1. Large soft tissue mass in the lower middle mediastinum measuring up to 4.1 x 2.7 cm, likely to represent an enlarged lymph node. Given the gastrohepatic ligament lymphadenopathy, findings are concerning for potential nodal spread of disease in this patient with history of lung cancer, but are nonspecific. Consider further evaluation with PET-CT and biopsy for further diagnostic and staging purposes. 2. New areas of volume loss, architectural distortion airspace consolidation in the left lower lobe, compatible with developing pneumonia or sequela of recent aspiration (this appears new compared to yesterday's examination). 3. Small bilateral pleural effusions layering dependently, also new compared to yesterday's examination. 4. Chronic postradiation changes of mass-like fibrosis in the perihilar aspect of the right lung. 5. Additional incidental findings, as above.   Electronically Signed   By: Vinnie Langton M.D.   On: 08/17/2014 13:05   Mr Brain Wo Contrast  08/17/2014   CLINICAL DATA:  Acute  encephalopathy. Development of altered mental status and forgetfulness over the past week. History of lung cancer.  EXAM: MRI HEAD WITHOUT CONTRAST  TECHNIQUE: Multiplanar, multiecho pulse sequences of the brain and surrounding structures were obtained without intravenous contrast.  COMPARISON:  12/31/2010  FINDINGS: Due to patient's altered mental status and inability to follow instructions, images are overall moderately degraded by motion artifact despite using more motion resistant imaging protocols. Due to patient's mental status, the examination was terminated prematurely, and axial T1 and coronal T2 weighted images were not obtained.  There is no evidence of acute infarct, intracranial hemorrhage, mass, midline shift, or extra-axial fluid collection. There is mild to moderate cerebral atrophy. Small foci of T2 hyperintensity in the cerebral white matter are grossly unchanged from the prior study and nonspecific but compatible with mild chronic small vessel ischemic disease. 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  Orbits are unremarkable. There is mild left maxillary sinus mucosal thickening. Mastoid air cells are clear. Distal left vertebral artery flow void again appears small, similar to prior. Other major intracranial vascular flow voids are preserved.  IMPRESSION: 1. Motion degraded, incomplete examination as above. No acute infarct. 2. Mild chronic small vessel ischemic disease and chronic lacunar infarcts as above.   Electronically Signed   By: Logan Bores   On: 08/17/2014 14:40   Dg Chest Port 1 View  08/16/2014   CLINICAL DATA:  Initial evaluation for tachycardia, history of hypertension and atrial fibrillation as well as lung cancer  EXAM: PORTABLE CHEST - 1 VIEW  COMPARISON:  10/23/2013  FINDINGS: Heart size and vascular pattern are normal. Left lung is clear. Abnormal opacity right perihilar region  consistent with radiation therapy change as described on prior CT scan. Right Port-A-Cath noted. Right lung otherwise clear.  IMPRESSION: No acute findings. Evidence of radiation change right parahilar region.   Electronically Signed   By: Skipper Cliche M.D.   On: 08/16/2014 19:07         Subjective: Patient is pleasant and confused. She  denies any chest pain, shortness breath, vomiting, diarrhea, abdominal pain.  Objective: Filed Vitals:   08/19/14 0501 08/19/14 0530 08/19/14 0542 08/19/14 0748  BP: 80/57 90/62  82/51  Pulse: 63     Temp:      TempSrc:      Resp: 15     Height:      Weight:      SpO2: 69%  100% 94%    Intake/Output Summary (Last 24 hours) at 08/19/14 0923 Last data filed at 08/19/14 0604  Gross per 24 hour  Intake 893.75 ml  Output      0 ml  Net 893.75 ml   Weight change:  Exam:   General:  Pt is alert, follows commands appropriately, not in acute distress  HEENT: No icterus, No thrush,[place]/AT  Cardiovascular: IRRR, S1/S2, no rubs, no gallops  Respiratory: Bibasilar crackles, left greater than right. No wheezing  Abdomen: Soft/+BS, non tender, non distended, no guarding  Extremities: trace LE edema, No lymphangitis, No petechiae, No rashes, no synovitis  Data Reviewed: Basic Metabolic Panel:  Recent Labs Lab 08/13/14 0855 08/16/14 1607 08/17/14 0310 08/18/14 0550 08/19/14 0600  NA 141 138 140 139 137  K 2.9* 5.4* 5.0 4.5 5.3*  CL  --  112 117* 116* 118*  CO2 18* 15* 15* 13* 12*  GLUCOSE 113 115* 101* 157* 120*  BUN 31.4* 30* 28* 30* 36*  CREATININE 1.8* 2.00* 1.78* 2.01* 2.40*  CALCIUM 9.1 9.4 8.4 8.8 8.2*  MG  --   --   --   --  1.1*   Liver Function Tests:  Recent Labs Lab 08/13/14 0855 08/16/14 1607  AST 12 16  ALT 10 13  ALKPHOS 71 64  BILITOT 0.95 1.5*  PROT 6.8 6.8  ALBUMIN 2.6* 2.8*   No results for input(s): LIPASE, AMYLASE in the last 168 hours.  Recent Labs Lab 08/17/14 1025  AMMONIA 13    CBC:  Recent Labs Lab 08/13/14 0855 08/16/14 1607 08/17/14 0310 08/17/14 1025 08/18/14 0550 08/19/14 0600  WBC 10.7* 9.2 10.8*  --  13.5* 11.1*  NEUTROABS 9.0* 8.2*  --   --   --   --   HGB 8.5* 8.5* 6.9*  --  11.9* 11.5*  HCT 27.2* 25.9* 21.3* 22.8* 34.3* 33.1*  MCV 97.6 92.5 89.9  --  84.3 86.4  PLT 186 212 194  --  PLATELET CLUMPS NOTED ON SMEAR, COUNT APPEARS DECREASED 99*   Cardiac Enzymes:  Recent Labs Lab 08/16/14 1825 08/16/14 2143 08/17/14 0310 08/17/14 1025 08/18/14 0900  TROPONINI 0.19* 0.19* 0.23* 0.28* 0.16*   BNP: Invalid input(s): POCBNP CBG: No results for input(s): GLUCAP in the last 168 hours.  Recent Results (from the past 240 hour(s))  Urine culture     Status: None   Collection Time: 08/16/14  2:32 PM  Result Value Ref Range Status   Specimen Description URINE, CATHETERIZED  Final   Special Requests NONE  Final   Colony Count   Final    95,000 COLONIES/ML Performed at [facility]    Culture   Final    Multiple bacterial morphotypes present, none predominant. Suggest appropriate recollection if clinically indicated. Performed at [facility]    Report Status 08/18/2014 FINAL  Final     Scheduled Meds: . antiseptic oral rinse  7 mL Mouth Rinse BID  . aspirin  81 mg Oral Daily  . ceFEPime (MAXIPIME) IV  1 g Intravenous Q24H  . enoxaparin (LOVENOX) injection  30 mg Subcutaneous Q24H  . folic acid  1 mg Oral Daily  . loratadine  10 mg Oral Daily  . magnesium sulfate 1 - 4 g bolus IVPB  4 g Intravenous Once  . metronidazole  500 mg Intravenous Q8H  . sodium chloride  3 mL Intravenous Q12H  . sodium polystyrene  15 g Oral Once   Continuous Infusions:    Maurizio Geno, DO  [facility] Pager [phone]  If 7PM-7AM, please contact night-coverage [email or web address] Password [email or web address] 08/19/2014, 9:23 AM   LOS: 3 days

## 2014-08-19 NOTE — Progress Notes (Signed)
PT Cancellation Note  Patient Details Name: Madison [name] MRN: [id number] DOB: 08-11-1945   Cancelled Treatment:    Reason Eval/Treat Not Completed: Medical issues which prohibited therapy.  Uncontrolled HR and  Low BP.  Heading to step-down. 08/19/2014  Donnella Sham, [place] [phone]  (pager)   Danessa Mensch, Tessie Fass 08/19/2014, 11:13 AM

## 2014-08-19 NOTE — Progress Notes (Signed)
RT Note: Patient requested not to be stuck again for ABG and MD was notified of that as well.

## 2014-08-19 NOTE — Progress Notes (Signed)
Patient Name: Madison [name] Date of Encounter: 08/19/2014     Principal Problem:   Lung cancer Active Problems:   CKD (chronic kidney disease) stage 3, GFR 30-59 ml/min   PAF (paroxysmal atrial fibrillation)   Metabolic acidosis   UTI (lower urinary tract infection)   Hyperkalemia   Altered mental status   Acute encephalopathy   Tachycardia   Elevated troponin   Abnormal EKG   Acute systolic congestive heart failure, NYHA class 4   Protein-calorie malnutrition, severe    SUBJECTIVE  No complaints. Denies CP or SOB  CURRENT MEDS . antiseptic oral rinse  7 mL Mouth Rinse BID  . aspirin  81 mg Oral Daily  . ceFEPime (MAXIPIME) IV  1 g Intravenous Q24H  . enoxaparin (LOVENOX) injection  30 mg Subcutaneous Q24H  . folic acid  1 mg Oral Daily  . loratadine  10 mg Oral Daily  . magnesium sulfate 1 - 4 g bolus IVPB  4 g Intravenous Once  . metronidazole  500 mg Intravenous Q8H  . sodium bicarbonate  1,300 mg Oral BID  . sodium chloride  3 mL Intravenous Q12H  . vancomycin  500 mg Intravenous Q48H    OBJECTIVE  Filed Vitals:   08/19/14 0501 08/19/14 0530 08/19/14 0542 08/19/14 0748  BP: 80/57 90/62  82/51  Pulse: 63     Temp:      TempSrc:      Resp: 15     Height:      Weight:      SpO2: 69%  100% 94%    Intake/Output Summary (Last 24 hours) at 08/19/14 1148 Last data filed at 08/19/14 0604  Gross per 24 hour  Intake 773.75 ml  Output      0 ml  Net 773.75 ml   Filed Weights   08/16/14 1413 08/16/14 2212  Weight: 129 lb (58.514 kg) 102 lb 9.6 oz (46.539 kg)    PHYSICAL EXAM   General: Pt is alert, follows commands appropriately, not in acute distress. Appears older than stated age.   HEENT: No icterus, No thrush,[place]/AT  Cardiovascular: IRRR, S1/S2, no rubs, no gallops  Respiratory: Bibasilar crackles, left greater than right. No wheezing  Abdomen: Soft/+BS, non tender, non distended, no guarding  Extremities: trace LE edema, No lymphangitis,  No petechiae, No rashes, no synovitis. Cool extremities  Accessory Clinical Findings  CBC  Recent Labs  08/16/14 1607  08/18/14 0550 08/19/14 0600  WBC 9.2  < > 13.5* 11.1*  NEUTROABS 8.2*  --   --   --   HGB 8.5*  < > 11.9* 11.5*  HCT 25.9*  < > 34.3* 33.1*  MCV 92.5  < > 84.3 86.4  PLT 212  < > PLATELET CLUMPS NOTED ON SMEAR, COUNT APPEARS DECREASED 99*  < > = values in this interval not displayed. Basic Metabolic Panel  Recent Labs  08/18/14 0550 08/19/14 0600  NA 139 137  K 4.5 5.3*  CL 116* 118*  CO2 13* 12*  GLUCOSE 157* 120*  BUN 30* 36*  CREATININE 2.01* 2.40*  CALCIUM 8.8 8.2*  MG  --  1.1*   Liver Function Tests  Recent Labs  08/16/14 1607  AST 16  ALT 13  ALKPHOS 64  BILITOT 1.5*  PROT 6.8  ALBUMIN 2.8*   No results for input(s): LIPASE, AMYLASE in the last 72 hours. Cardiac Enzymes  Recent Labs  08/17/14 0310 08/17/14 1025 08/18/14 0900  TROPONINI 0.23* 0.28* 0.16*  Thyroid Function Tests  Recent Labs  08/17/14 1025  TSH 2.900    TELE  afib with RVR. HR 120-160s  Radiology/Studies  Ct Abdomen Pelvis Wo Contrast  08/16/2014   CLINICAL DATA:  Low back/right flank pain x3 days, history of lung cancer  EXAM: CT ABDOMEN AND PELVIS WITHOUT CONTRAST  TECHNIQUE: Multidetector CT imaging of the abdomen and pelvis was performed following the standard protocol without IV contrast.  COMPARISON:  CT abdomen pelvis dated 04/19/2012. Partial comparison to CT chest dated 10/23/2013.  FINDINGS: Lower chest:  Left lower lobe scarring/atelectasis.  3.5 x 3.0 cm lesion adjacent to the descending thoracic aorta (series 21/ image 1), measuring at the upper limits of normal for fluid density, incompletely visualized but new from prior CT. This appearance is worrisome for an necrotic node.  Hepatobiliary: Liver is unremarkable.  Gallbladder is notable for layering gallstones (series 21/ image 23). No associated inflammatory changes. No intrahepatic or  extrahepatic ductal dilatation.  Pancreas: Within normal limits.  Spleen: Within normal limits.  Adrenals/Urinary Tract: Adrenal glands are unremarkable.  Kidneys are grossly unremarkable, noting a 10 mm probable cyst along the right lower kidney (series 21/image 25). No renal calculi or hydronephrosis.  No ureteral or bladder calculi.  Bladder is unremarkable.  Stomach/Bowel: Stomach is notable for a small hiatal or paraesophageal hernia (Series 21/ image 7).  No evidence of bowel obstruction.  Normal appendix.  Colonic diverticulosis, without associated inflammatory changes.  Vascular/Lymphatic: Atherosclerotic calcifications of the abdominal aorta and branch vessels.  1.8 cm short axis gastrohepatic node (series 21/ image 13), new.  Reproductive: Uterus is unremarkable.  No adnexal masses.  Other: No abdominopelvic ascites.  Musculoskeletal: Degenerative changes of the visualized thoracolumbar spine.  Mild superior endplate compression fracture deformity at L1, unchanged from prior CT chest.  IMPRESSION: No evidence of bowel obstruction.  Normal appendix.  Cholelithiasis, without associated inflammatory changes.  No renal, ureteral, or bladder calculi.  3.5 x 3.0 cm suspected necrotic node adjacent to the descending thoracic aorta, incompletely visualized. Additional 1.8 cm short axis gastrohepatic node. These are new from prior CT and suspicious for nodal metastases.  Consider dedicated CT of the chest for further evaluation.   Electronically Signed   By: Julian Hy M.D.   On: 08/16/2014 18:12   Ct Chest Wo Contrast  08/17/2014   CLINICAL DATA:  69 year old female with history of squamous cell carcinoma of the lung.  EXAM: CT CHEST WITHOUT CONTRAST  TECHNIQUE: Multidetector CT imaging of the chest was performed following the standard protocol without IV contrast.  COMPARISON:  Chest CT 10/23/2013.  FINDINGS: Mediastinum/Lymph Nodes: Right internal jugular single-lumen porta cath with tip terminating at  the superior cavoatrial junction. Heart size is normal. Trace amount of pericardial fluid and/or thickening, unlikely to be of any hemodynamic significance at this time. No associated pericardial calcification. 4.1 x 2.7 cm soft tissue attenuation lesion in the middle mediastinum abutting the left side of the descending thoracic aorta (image 36 of series 201), and in close contact with the distal third of the esophagus.  Lungs/Pleura: Chronic postradiation changes of mass-like fibrosis in the right perihilar region are unchanged. Small bilateral pleural effusions are new compared to the prior examination. Extensive volume loss, architectural distortion and airspace consolidation in the basal segments of the left lower lobe. Scattered micronodules in the lungs bilaterally, similar to the prior examination, with the largest of these clustered in the anterior aspect of the right upper lobe, similar to the prior examination,  favored to reflect predominantly areas of mucoid impaction.  Musculoskeletal/Soft Tissues: Multiple old vertebral body compression fractures, unchanged compared to the prior examination, involving predominantly the superior endplates of T2, T7, D17, T12 and L1, most severe at L1 where there is approximately 15% loss of anterior vertebral body height. There are no aggressive appearing lytic or blastic lesions noted in the visualized portions of the skeleton.  Upper Abdomen: Enlarged gastrohepatic ligament lymph node measuring 1.9 cm in short axis.  IMPRESSION: 1. Large soft tissue mass in the lower middle mediastinum measuring up to 4.1 x 2.7 cm, likely to represent an enlarged lymph node. Given the gastrohepatic ligament lymphadenopathy, findings are concerning for potential nodal spread of disease in this patient with history of lung cancer, but are nonspecific. Consider further evaluation with PET-CT and biopsy for further diagnostic and staging purposes. 2. New areas of volume loss, architectural  distortion airspace consolidation in the left lower lobe, compatible with developing pneumonia or sequela of recent aspiration (this appears new compared to yesterday's examination). 3. Small bilateral pleural effusions layering dependently, also new compared to yesterday's examination. 4. Chronic postradiation changes of mass-like fibrosis in the perihilar aspect of the right lung. 5. Additional incidental findings, as above.   Electronically Signed   By: Vinnie Langton M.D.   On: 08/17/2014 13:05   Mr Brain Wo Contrast  08/17/2014   CLINICAL DATA:  Acute encephalopathy. Development of altered mental status and forgetfulness over the past week. History of lung cancer.  EXAM: MRI HEAD WITHOUT CONTRAST  TECHNIQUE: Multiplanar, multiecho pulse sequences of the brain and surrounding structures were obtained without intravenous contrast.  COMPARISON:  12/31/2010  FINDINGS: Due to patient's altered mental status and inability to follow instructions, images are overall moderately degraded by motion artifact despite using more motion resistant imaging protocols. Due to patient's mental status, the examination was terminated prematurely, and axial T1 and coronal T2 weighted images were not obtained.  There is no evidence of acute infarct, intracranial hemorrhage, mass, midline shift, or extra-axial fluid collection. There is mild to moderate cerebral atrophy. Small foci of T2 hyperintensity in the cerebral white matter are grossly unchanged from the prior study and nonspecific but compatible with mild chronic small vessel ischemic disease. 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  Orbits are unremarkable. There is mild left maxillary sinus mucosal thickening. Mastoid air cells are clear. Distal left vertebral artery flow void again appears small, similar to prior. Other major intracranial vascular flow voids are preserved.   IMPRESSION: 1. Motion degraded, incomplete examination as above. No acute infarct. 2. Mild chronic small vessel ischemic disease and chronic lacunar infarcts as above.   Electronically Signed   By: Logan Bores   On: 08/17/2014 14:40   Dg Chest Port 1 View  08/19/2014   CLINICAL DATA:  Acute respiratory failure  EXAM: PORTABLE CHEST - 1 VIEW  COMPARISON:  CT chest dated 08/17/2014  FINDINGS: Retrocardiac opacity, suspicious for pneumonia.  Patchy right lower lobe opacity, possibly atelectasis. Small bilateral pleural effusions. No frank interstitial edema.  The heart is top-normal in size.  Right chest power port terminates cavoatrial junction.  IMPRESSION: Retrocardiac opacity, suspicious for pneumonia.  Small bilateral pleural effusions.   Electronically Signed   By: Julian Hy M.D.   On: 08/19/2014 10:11   Dg Chest Port 1 View  08/16/2014   CLINICAL DATA:  Initial evaluation for  tachycardia, history of hypertension and atrial fibrillation as well as lung cancer  EXAM: PORTABLE CHEST - 1 VIEW  COMPARISON:  10/23/2013  FINDINGS: Heart size and vascular pattern are normal. Left lung is clear. Abnormal opacity right perihilar region consistent with radiation therapy change as described on prior CT scan. Right Port-A-Cath noted. Right lung otherwise clear.  IMPRESSION: No acute findings. Evidence of radiation change right parahilar region.   Electronically Signed   By: Skipper Cliche M.D.   On: 08/16/2014 19:07    ASSESSMENT AND PLAN  This is a 69 y.o. female with a past medical history significant for stage III lung cancer with possible recurrence, HTN, PAF, and CKD3, who presented to [facility] 08/16/14 with AMS. She was also found to have significant anemia requiring 2U transfusion, UTI and CT of the abdomen and pelvis did show a 3.5 x 3 cm necrotic lymph node. Troponin was checked and mildly elevated.  EKG shows persistent anterolateral T-wave inversions which are new compared to her most recent office  visit in 2015. Cardiology is asked to consult regarding elevated troponin and abnormal EKG.   Acute systolic congestive heart failure -- New cardiomyopathy with EF around 20%. There is global hypokinesis with LAD territory wall motion abnormalities. This could represent multivessel coronary disease, possibly a nonischemic cardiomyopathy or even a Takatsubo cardiomyopathy. She denies any chest pain or shortness of breath. BNP is elevated greater than 4000. Troponin has remained flat which is less likely to be related to acute coronary syndrome and more likely related to heart failure. Given her multiple comorbidities including a history of lung cancer with possible recurrence, stage III or 4 chronic kidney disease, urinary tract infection and recent anemia she is not a good candidate for cardiac catheterization or bypass surgery if she would be found to have multivessel coronary disease. Based on this, stress testing would be of little utility. Optimizing her heart failure medications and treating this medically was recommended -- ASA and low-dose carvedilol 3.125 mg BID added based on elevated troponins and the possibility of underlying coronary disease.  -- Given lasix 40 mg IV x 1 yesterday.Still + 3L. Not much UOP per nurse. Watch creat as it did bump yesterday 2.0-->2.4  Does not appear volume overloaded on exam  Hypotension- Given a slow bolus of 500cc this AM. No UOP since then. WIll get a bladder scan. If normal will give 200 cc fluid bolus now. She does not seem volume overloaded and creat bumped after 40mg  IV lasix.   Stage III lung cancer-she will need restaging of her lung cancer and if it is found to have recurrent lung cancer, given her cardiomyopathy, hospice care should be considered  PAF- She converted to afib with RVR sometime this AM. HR not well controlled. Soft pressures ( last 82/51). Given digoxin 0.125mg  at 0600 today -- She was apparently treated with warfarin in 2012 however had  no recurrence of A. fib and anticoagulation was stopped in 2013. Given possible recurrence of lung cancer and the fact that she was profoundly anemic on admission without a clear source, I do not feel that she is a good candidate for long-term warfarin. Continue ASA.   Anemia- She was found to be significantly anemic and transfused 2 units of packed red blood cells. H/H- 11.5/33.1 today  CKD- baseline creat looks to be around 2. Creat bump to 2.4 today   Hyperkalemia -- Due to the patient's home potassium supplementation, CKD -- Discontinue potassium supplement -- 08/19/14--give kayexalate  x 1  Hypomagnesemia -- Repleted per IM   Signed, Eileen Stanford PA-C  Pager [phone]  I have personally seen and examined patient and agree with note as outlined by Angelena Form, PA.  Patient converted to afib with RVR and hypotension.  She was given a 500cc saline bolus but BP still low.  Bladder scan showed 50cc residual.  Her BNP is elevated and it was felt that she was volume overloaded but she has no edema and creatinine has bumped.  I suspect that she is intravascularly dry and that elevated BNP may be due to renal insuff in the setting of active lung problems.  Will give another 250cc saline bolus and if pressure improves give Amio 150mg  IV over 30 minutes and then start on Amio gtt.  She will be transferred to the stepdown unit.  Signed:  Fransico Him, MD [facility] 08/19/2014

## 2014-08-19 NOTE — Evaluation (Signed)
Clinical/Bedside Swallow Evaluation Patient Details  Name: Madison [name] MRN: [id number] Date of Birth: January 17, 1946  Today's Date: 08/19/2014 Time: 7564-3329 SLP Time Calculation (min) (ACUTE ONLY): 23 min  Past Medical History:  Past Medical History  Diagnosis Date  . Hypertension   . Anemia   . Atrial fibrillation   . Chronic kidney disease   . lung ca     lung ca  . Lung cancer 11/11/10   Past Surgical History:  Past Surgical History  Procedure Laterality Date  . Port a cath placement     HPI:  69 year old female with a history of squamous cell carcinoma of the lung previously treated with chemotherapy which she completed 07/14/2011, CKD, hypertension presented from home secondary to increasing forgetfulness and mental status change for one week. Diagnosed UTI. Difficulty remembering her appointments. Tachycardia in the emergency department. CT of the chest revealed a new 4.1 x 2.7 soft tissue mass in the lower mediastinum. The patient's blood pressure has been soft throughout the hospitalization and he mid 90s. Slightly SOB. MRI- no sign of stroke. MBS completed on 11/22/2010 demonstrated functional swallow during MBS and recommended Dys lvl 3. Coughing noted throughout procedure but not due to  aspiration.    Assessment / Plan / Recommendation Clinical Impression  Pt function characterized by impaired cognitive status. Pt was given thin liquids and purees during BSE. Pt orally held thin liquids and purees. No solids were attempted. Pt required multiple verbal, tactile and contextual cues to swallow. Family memebers were used as therapeutic agents to get Pt to swallow.  No s/s of aspiration were noted. She was alert but was uncooperative.  Pt preferred medications be dry crushed and washed down with water. Pt may continue on regular diet and thin liquids, though, suspect intake will be poor due to poor cognition and apetite.     Aspiration Risk  Mild    Diet Recommendation  Regular;Thin liquid   Liquid Administration via: Cup;Straw Medication Administration: Other (Comment) (Crush pills and give water. ) Supervision: Staff to assist with self feeding Postural Changes and/or Swallow Maneuvers: Seated upright 90 degrees    Other  Recommendations     Follow Up Recommendations       Frequency and Duration min 2x/week  2 weeks   Pertinent Vitals/Pain NA    SLP Swallow Goals     Swallow Study Prior Functional Status       General HPI: 69 year old female with a history of squamous cell carcinoma of the lung previously treated with chemotherapy which she completed 07/14/2011, CKD, hypertension presented from home secondary to increasing forgetfulness and mental status change for one week. Diagnosed UTI. Difficulty remembering her appointments. Tachycardia in the emergency department. CT of the chest revealed a new 4.1 x 2.7 soft tissue mass in the lower mediastinum. The patient's blood pressure has been soft throughout the hospitalization and he mid 90s. Slightly SOB. MRI- no sign of stroke. MBS completed on 11/22/2010 demonstrated functional swallow during MBS and recommended Dys lvl 3. Coughing noted throughout procedure but not due to  aspiration.  Type of Study: Bedside swallow evaluation Previous Swallow Assessment: See HPI Diet Prior to this Study: Regular;Thin liquids Temperature Spikes Noted: No Respiratory Status: Nasal cannula History of Recent Intubation: No Behavior/Cognition: Alert;Uncooperative;Confused;Requires cueing;Doesn't follow directions Self-Feeding Abilities: Able to feed self Patient Positioning: Upright in bed Baseline Vocal Quality: Clear    Oral/Motor/Sensory Function Overall Oral Motor/Sensory Function: Appears within functional limits for tasks assessed   [employer]  chips: Not tested   Thin Liquid Thin Liquid: Impaired Presentation: Cup;Straw Oral Phase Functional Implications: Oral holding    Nectar Thick Nectar Thick  Liquid: Not tested   Honey Thick Honey Thick Liquid: Not tested   Puree Puree: Impaired Presentation: Spoon Oral Phase Functional Implications: Oral holding   Solid   GO    Solid: Not tested       Bermudez-Bosch, Rebekkah Powless 08/19/2014,2:44 PM

## 2014-08-19 NOTE — Consult Note (Signed)
PULMONARY / CRITICAL CARE MEDICINE   Name: Madison [name] MRN: [id number] DOB: 11-Apr-1946    ADMISSION DATE:  08/16/2014 CONSULTATION DATE:  1/12  REFERRING MD :  Tat   CHIEF COMPLAINT:  Hypotension   INITIAL PRESENTATION:  69 year old female  W/ h/o NSCLC s/p rx 2012-->observation status since then. Admitted on 1/9 w/ working dx encephalopathy felt multifactorial in setting of UTI, sepsis, metabolic acidosis and possibly meds. Course notable for mild trop bump and new systolic HF w/ EF 05-69%. CT findings in addition to possible LLL PNA also concerning for new LN metastasis near thoracic aorta. Developed new onset AF/RVR on 1/12 and new hypotension. PCCM asked to a/w care.    STUDIES:  CT abd/Pelvis 1/9: No evidence of bowel obstruction. Normal appendix. Cholelithiasis, without associated inflammatory changes. No renal, ureteral, or bladder calculi.3.5 x 3.0 cm suspected necrotic node adjacent to the descending thoracic aorta, incompletely visualized. Additional 1.8 cm short axis gastrohepatic node. These are new from prior CT and suspicious for nodal metastases. CT chest 1/10: 1. Large soft tissue mass in the lower middle mediastinum measuring up to 4.1 x 2.7 cm, likely to represent an enlarged lymph node.Given the gastrohepatic ligament lymphadenopathy, findings are concerning for potential nodal spread of disease in this patient with history of lung cancer, but are nonspecific. 2. New areas of volume loss, architectural distortion airspace consolidation in the left lower lobe, compatible with developing pneumonia or sequela of recent aspiration (this appears new compared to yesterday's examination). 3. Small bilateral pleural effusions layering dependently, also new compared to yesterday's examination. 4. Chronic postradiation changes of mass-like fibrosis in the perihilar aspect of the right lung. 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 Wall thickness was normal. Systolic function was severely reduced. The estimated ejection fraction was in the range of 20% to 25%. Aortic valve: Mildly dilated non coronary sinus of valsalva.Atrial septum: No defect or patent foramen ovale was identified.  SIGNIFICANT EVENTS:    HISTORY OF PRESENT ILLNESS:   69 year old female with a history of squamous cell carcinoma of the lung previously treated with chemotherapy which she completed 07/14/2011-->now on observation.  CKD, hypertension presented from home on 1/9 secondary to increasing forgetfulness and mental status change for one week.  There had also been loose stools, flank pain, and weight loss. The patient was found to have hyperkalemia with pyuria and sinus tachycardia in the emergency department.  She was treated w/ IV hydration and empiric antibiotics. The patient was transfused with 2 units PRBC.  CT of the abdomen and pelvis, which was confirmed by CT of the chest revealed a new 4.1 x 2.7 soft tissue mass in the lower mediastinum. This was suspicious for a new necrotic lymph node. The patient's hemoglobin dropped to 6.9 on the subsequent day after admission. Since admission, the patient had minimally elevated troponins with concerning EKG changes with T-wave inversion V3-V6. The patient's blood pressure has been soft throughout the hospitalization and he mid 90s. On the AM of 1/12 she had developed AF w/ RVR. PCCM asked to see re: hypotension in the setting of AF and concern about volume overload   PAST MEDICAL HISTORY :   has a past medical history of Hypertension; Anemia; Atrial fibrillation; Chronic kidney disease; lung ca; and Lung cancer (11/11/10).  has past surgical history  that includes port a cath placement. Prior to Admission medications   Medication Sig Start Date End Date  Taking? Authorizing Provider  diltiazem (CARDIZEM CD) 180 MG 24 hr capsule Take 1 capsule (180 mg total) by mouth daily. 12/20/13  Yes Blane Ohara, MD  folic acid (FOLVITE) 1 MG tablet Take 1 mg by mouth daily.   Yes Historical Provider, MD  lidocaine-prilocaine (EMLA) cream Apply 1 application topically as needed. For port-a-cath access.   Yes Historical Provider, MD  loratadine (CLARITIN) 10 MG tablet Take 10 mg by mouth daily.     Yes Historical Provider, MD  methylPREDNIsolone (MEDROL DOSPACK) 4 MG tablet follow package directions 08/13/14  Yes Curt Bears, MD  potassium chloride SA (K-DUR,KLOR-CON) 20 MEQ tablet Take 2 tablets (40 mEq total) by mouth 2 (two) times daily. 08/13/14  Yes Curt Bears, MD  Probiotic Product (PROBIOTIC PO) Take 1 tablet by mouth daily.   Yes Historical Provider, MD   Allergies  Allergen Reactions  . Lisinopril Swelling    Makes face swell    FAMILY HISTORY:  indicated that her mother is deceased. She indicated that her father is deceased. She indicated that only one of her two brothers is alive.  SOCIAL HISTORY:  reports that she quit smoking about 3 years ago. Her smoking use included Cigarettes. She has a 40 pack-year smoking history. She has never used smokeless tobacco. She reports that she does not drink alcohol or use illicit drugs.  REVIEW OF SYSTEMS:  Unable   SUBJECTIVE:  Confused   VITAL SIGNS: Temp:  [98.6 F (37 C)] 98.6 F (37 C) (01/11 1321) Pulse Rate:  [63-120] 63 (01/12 0501) Resp:  [12-16] 15 (01/12 0501) BP: (76-109)/(51-92) 76/59 mmHg (01/12 1100) SpO2:  [69 %-100 %] 92 % (01/12 1100) HEMODYNAMICS:   VENTILATOR SETTINGS:   INTAKE / OUTPUT:  Intake/Output Summary (Last 24 hours) at 08/19/14 1252 Last data filed at 08/19/14 1054  Gross per 24 hour  Intake 973.75 ml  Output      0 ml  Net 973.75 ml    PHYSICAL EXAMINATION: General:  Frail 69 year old female, confused but f/c and not in distress.  Neuro:  Awake,  oriented only X 1, no focal def  HEENT:  [place], no JVD, MMM Cardiovascular:  rrr Lungs:  Decreased  Abdomen:  Non-tender + bowel sounds  Musculoskeletal:  Intact  Skin:  Dry and intact   LABS:  CBC  Recent Labs Lab 08/17/14 0310 08/17/14 1025 08/18/14 0550 08/19/14 0600  WBC 10.8*  --  13.5* 11.1*  HGB 6.9*  --  11.9* 11.5*  HCT 21.3* 22.8* 34.3* 33.1*  PLT 194  --  PLATELET CLUMPS NOTED ON SMEAR, COUNT APPEARS DECREASED 99*   Coag's  Recent Labs Lab 08/19/14 0903  APTT 37   BMET  Recent Labs Lab 08/17/14 0310 08/18/14 0550 08/19/14 0600  NA 140 139 137  K 5.0 4.5 5.3*  CL 117* 116* 118*  CO2 15* 13* 12*  BUN 28* 30* 36*  CREATININE 1.78* 2.01* 2.40*  GLUCOSE 101* 157* 120*   Electrolytes  Recent Labs Lab 08/17/14 0310 08/18/14 0550 08/19/14 0600  CALCIUM 8.4 8.8 8.2*  MG  --   --  1.1*   Sepsis Markers  Recent Labs Lab 08/17/14 1025 08/19/14 0848 08/19/14 0903  LATICACIDVEN 1.5  --  2.4*  PROCALCITON  --  0.43  --    ABG No results for input(s): PHART, PCO2ART, PO2ART in the last  168 hours. Liver Enzymes  Recent Labs Lab 08/13/14 0855 08/16/14 1607  AST 12 16  ALT 10 13  ALKPHOS 71 64  BILITOT 0.95 1.5*  ALBUMIN 2.6* 2.8*   Cardiac Enzymes  Recent Labs Lab 08/17/14 0310 08/17/14 1025 08/18/14 0900  TROPONINI 0.23* 0.28* 0.16*   Glucose No results for input(s): GLUCAP in the last 168 hours.  Imaging No results found.   ASSESSMENT / PLAN:  PULMONARY OETT A: LLL PNA vs ATX Small pleural effusions  H/o Lung cancer (NSCLC) observation status since 2012, now w/ CT evidence of recurrence  No acute distress. Does NOT appear volume overloaded.  P:   Wean FIO2 Pulm hygiene  F/u cxr  Will need new PET scan after d/c  CARDIOVASCULAR CVL A:  New AF w/ RVR -->now back in NSR Acute systolic heart failure -EF 20% w/ global hypokinesis  Mild troponin elevation  Hypotension: think this is mix of volume depletion and  acidosis  P:  Repeat fluid challenge  Check lactic acid and SCVO2 after fluid challenge Hold antihypertensives May need to consider inotrope support if does not respond to fluid challenge. CVP monitoring may be helpful  Would make SBP goal > 90   RENAL A:   Stage III-IV CKD Acute on Chronic renal failure-->worse after lasix  >baseline scr 3.0-1.6 NAG metabolic acidosis  Hyperchloremia  Hyperkalemia -->got Kayexalate  Hypomagnesemia  P:   F/u chem  Agree w/ bicarb supplementation; will change to IV  Repeat electrolytes after replacement  Serial chemistries  Trend I&O Renal dose meds   GASTROINTESTINAL A:   No acute  P:   Diet as MS and resp status allows   HEMATOLOGIC A:   H/o stage IIIa NSCLC (squamous cell); had been on Observation since 2012 w/ last visit on 1/6 w/ heme/onc  CT chest/abd 1/10 w/ new 3.5x3 cm necrotic appearing LN suspicious for nodal involvement  Anemia of chronic disease s/p 2 units blood after admit  P:  Trend CBC Transfuse for usual ICU guidelines LMWH   INFECTIOUS A:   Sepsis UTI Possible LLL PNA  P:   BCx2 1/12>>> UC 1/9: multiple morphotypes. 95K Cefepime 1/10>>1/12 Flagyl 1/10>>>1/12 Zosyn 1/12>>> vanc 1/12>>>  ENDOCRINE A:   No acute    P:   Trend glucose   NEUROLOGIC A:   Acute encephalopathy: remains confused. Felt multifactorial: sepsis and metabolic derangements +/- meds  P:   RASS goal: 0 Limit sedation  Supportive Care     TODAY'S NP SUMMARY:  This is a 69 year old female who was admitted w/ acute encephalopathy felt likely d/t sepsis in setting of UTI and PNA, also further c/b NAG metabolic acidosis. She was admitted for supportive care. Diagnostic findings discovered new evidence of LN metastasis near thoracic aorta, and also LLL ATX vs PNA. Since hospital admit has had boarderline BP in 90s, and climbing creatinine. In spite of this her MS has improved some. We have been asked to see after this am when she  developed AF w/ RVR and worsening hypotension w/ concern about possible volume overload in setting of newly diagnosed HF. She does not appear volume overloaded. Suspect that volume depletion and acidosis are contributing to her hypotension. Will repeat fluid challenge and initiate bicarb infusion. Will repeat lactic acid and SCVO2 after her fluid challenge. Will try to avoid central access as she is at risk for pulling out line w/ her confusion; but this may be needed to help better care for her.  Erick Colace ACNP-BC [place] Pager # [phone] OR # [phone] if no answer  Patient's hypotension, suspect related to hypovolemia.  Cardiogenic cause can not be ruled out but on exam there is no JVD and exam is not consistent.  Will give a fluid challenge and start Bicarb drip.  If effective then will not place TLC.  If remains hypotensive then will place TLC, follow CVP and start pressors.  Respiratory status is stable for now.  The patient is critically ill with multiple organ systems failure and requires high complexity decision making for assessment and support, frequent evaluation and titration of therapies, application of advanced monitoring technologies and extensive interpretation of multiple databases.   Critical Care Time devoted to patient care services described in this note is 45 Minutes. This time reflects time of care of this signee Dr Jennet Maduro. This critical care time does not reflect procedure time, or teaching time or supervisory time of PA/NP/Med student/Med Resident etc but could involve care discussion time.  Rush Farmer, M.D. [facility] Pulmonary/Critical Care Medicine. Pager: [phone]. After hours pager: [phone].   08/19/2014, 12:52 PM

## 2014-08-20 ENCOUNTER — Inpatient Hospital Stay (HOSPITAL_COMMUNITY): Payer: Medicare Other

## 2014-08-20 DIAGNOSIS — E876 Hypokalemia: Secondary | ICD-10-CM | POA: Insufficient documentation

## 2014-08-20 DIAGNOSIS — IMO0001 Reserved for inherently not codable concepts without codable children: Secondary | ICD-10-CM | POA: Insufficient documentation

## 2014-08-20 DIAGNOSIS — A419 Sepsis, unspecified organism: Principal | ICD-10-CM

## 2014-08-20 DIAGNOSIS — I4891 Unspecified atrial fibrillation: Secondary | ICD-10-CM | POA: Diagnosis present

## 2014-08-20 LAB — BASIC METABOLIC PANEL
Anion gap: 13 (ref 5–15)
Anion gap: 15 (ref 5–15)
Anion gap: 13 (ref 5–15)
Anion gap: 13 (ref 5–15)
BUN: 33 mg/dL — AB (ref 6–23)
BUN: 34 mg/dL — AB (ref 6–23)
BUN: 33 mg/dL — ABNORMAL HIGH (ref 6–23)
BUN: 32 mg/dL — AB (ref 6–23)
CALCIUM: 8.1 mg/dL — AB (ref 8.4–10.5)
CHLORIDE: 107 meq/L (ref 96–112)
CO2: 15 mmol/L — ABNORMAL LOW (ref 19–32)
CO2: 17 mmol/L — ABNORMAL LOW (ref 19–32)
CO2: 22 mmol/L (ref 19–32)
CO2: 25 mmol/L (ref 19–32)
CREATININE: 2.57 mg/dL — AB (ref 0.50–1.10)
CREATININE: 2.71 mg/dL — AB (ref 0.50–1.10)
CREATININE: 2.67 mg/dL — AB (ref 0.50–1.10)
Calcium: 7.7 mg/dL — ABNORMAL LOW (ref 8.4–10.5)
Calcium: 8.1 mg/dL — ABNORMAL LOW (ref 8.4–10.5)
Calcium: 7.8 mg/dL — ABNORMAL LOW (ref 8.4–10.5)
Chloride: 113 mEq/L — ABNORMAL HIGH (ref 96–112)
Chloride: 110 mEq/L (ref 96–112)
Chloride: 106 mEq/L (ref 96–112)
Creatinine, Ser: 2.6 mg/dL — ABNORMAL HIGH (ref 0.50–1.10)
GFR calc Af Amer: 21 mL/min — ABNORMAL LOW (ref >90)
GFR calc Af Amer: 20 mL/min — ABNORMAL LOW (ref >90)
GFR calc Af Amer: 20 mL/min — ABNORMAL LOW (ref >90)
GFR calc non Af Amer: 18 mL/min — ABNORMAL LOW (ref >90)
GFR calc non Af Amer: 17 mL/min — ABNORMAL LOW (ref >90)
GFR, EST AFRICAN AMERICAN: 19 mL/min — AB (ref >90)
GFR, EST NON AFRICAN AMERICAN: 18 mL/min — AB (ref >90)
GFR, EST NON AFRICAN AMERICAN: 17 mL/min — AB (ref >90)
GLUCOSE: 143 mg/dL — AB (ref 70–99)
GLUCOSE: 135 mg/dL — AB (ref 70–99)
GLUCOSE: 132 mg/dL — AB (ref 70–99)
Glucose, Bld: 135 mg/dL — ABNORMAL HIGH (ref 70–99)
POTASSIUM: 3.6 mmol/L (ref 3.5–5.1)
POTASSIUM: 4 mmol/L (ref 3.5–5.1)
Potassium: 3.1 mmol/L — ABNORMAL LOW (ref 3.5–5.1)
Potassium: 2.8 mmol/L — ABNORMAL LOW (ref 3.5–5.1)
Sodium: 141 mmol/L (ref 135–145)
Sodium: 139 mmol/L (ref 135–145)
Sodium: 145 mmol/L (ref 135–145)
Sodium: 144 mmol/L (ref 135–145)

## 2014-08-20 LAB — CORTISOL-AM, BLOOD: CORTISOL - AM: 59.3 ug/dL — AB (ref 4.3–22.4)

## 2014-08-20 LAB — MAGNESIUM: MAGNESIUM: 2.2 mg/dL (ref 1.5–2.5)

## 2014-08-20 LAB — CBC
HEMATOCRIT: 27.8 % — AB (ref 36.0–46.0)
Hemoglobin: 10 g/dL — ABNORMAL LOW (ref 12.0–15.0)
MCH: 30 pg (ref 26.0–34.0)
MCHC: 36 g/dL (ref 30.0–36.0)
MCV: 83.5 fL (ref 78.0–100.0)
Platelets: 88 10*3/uL — ABNORMAL LOW (ref 150–400)
RBC: 3.33 MIL/uL — ABNORMAL LOW (ref 3.87–5.11)
RDW: 19 % — AB (ref 11.5–15.5)
WBC: 10.2 10*3/uL (ref 4.0–10.5)

## 2014-08-20 MED ORDER — CARVEDILOL 3.125 MG PO TABS
3.1250 mg | ORAL_TABLET | Freq: Two times a day (BID) | ORAL | Status: DC
  Administered 2014-08-21 – 2014-08-22 (×4): 3.125 mg via ORAL
  Filled 2014-08-20 (×5): qty 1

## 2014-08-20 MED ORDER — POTASSIUM CHLORIDE CRYS ER 20 MEQ PO TBCR: 40.0000 meq | EXTENDED_RELEASE_TABLET | ORAL | Status: DC

## 2014-08-20 MED ORDER — SODIUM CHLORIDE 0.9 % IV BOLUS (SEPSIS)
250.0000 mL | Freq: Once | INTRAVENOUS | Status: AC
Start: 2014-08-20 — End: 2014-08-20
  Administered 2014-08-20: 250 mL via INTRAVENOUS

## 2014-08-20 MED ORDER — METOPROLOL TARTRATE 12.5 MG HALF TABLET
12.5000 mg | ORAL_TABLET | Freq: Two times a day (BID) | ORAL | Status: DC
  Administered 2014-08-20: 12.5 mg via ORAL
  Filled 2014-08-20: qty 1

## 2014-08-20 MED ORDER — POTASSIUM CHLORIDE 20 MEQ PO PACK: 40.0000 meq | PACK | ORAL | Status: DC

## 2014-08-20 MED ORDER — POTASSIUM CHLORIDE 20 MEQ/15ML (10%) PO SOLN
40.0000 meq | ORAL | Status: AC
  Administered 2014-08-20 (×2): 40 meq via ORAL
  Filled 2014-08-20 (×2): qty 30

## 2014-08-20 MED ORDER — DIGOXIN 0.25 MG/ML IJ SOLN
0.1250 mg | Freq: Once | INTRAMUSCULAR | Status: AC
  Administered 2014-08-20: 0.125 mg via INTRAVENOUS
  Filled 2014-08-20: qty 0.5

## 2014-08-20 NOTE — Clinical Social Work Psychosocial (Signed)
     Clinical Social Work Department BRIEF PSYCHOSOCIAL ASSESSMENT 08/20/2014  Patient:  RONELL, BOLDIN     Account Number:  [id number]     Bonnie date:  08/16/2014  Clinical Social Worker:  Adair Laundry  Date/Time:  08/20/2014 03:25 PM  Referred by:  Physician  Date Referred:  08/20/2014 Referred for  SNF Placement   Other Referral:   Interview type:  Family Other interview type:    PSYCHOSOCIAL DATA Living Status:  WITH ADULT CHILDREN Admitted from facility:   Level of care:   Primary support name:  Ashey Tramontana [phone] Primary support relationship to patient:  CHILD, ADULT Degree of support available:   Pt has very supportive family    CURRENT CONCERNS Current Concerns  Post-Acute Placement   Other Concerns:    SOCIAL WORK ASSESSMENT / PLAN CSW aware of PT recommendation and visited pt room to discuss with family. Pt sisters and famiy friend at bedside. Pt confused at time of assessment. Pt sisters informed CSW that pt lives at home with her two sons and her husband. Pt has been the caregiver for her husband but family agrees pt needs additional care for herself at this time. Pt sister Joycelyn Schmid informed CSW that CSW should talk to pt son Karle Starch and provided CSW with his cell phone number. CSW spoke with Karle Starch and he confirmed pt living situation. CSW explained SNF recommendation. Karle Starch in agreement that this would be beneficial for pt. Pt has been to [facility] before and pt son has no objections with pt returning to this facility but would like referral sent to all [facility] so he can evaluate options. CSW explained process and pt son understanding. Pt son had no questions or concerns about SNF and expressed multiple times that he believes this would be good for pt.   Assessment/plan status:  Psychosocial Support/Ongoing Assessment of Needs Other assessment/ plan:   Information/referral to community resources:   SNF list to be provided with bed  offers    PATIENTS/FAMILYS RESPONSE TO PLAN OF CARE: Pt family agreeable to SNF.      [place], [place]

## 2014-08-20 NOTE — Evaluation (Signed)
Physical Therapy Evaluation Patient Details Name: Madison [name] MRN: [id number] DOB: January 26, 1946 Today's Date: 08/20/2014   History of Present Illness  69 year old female with a history of squamous cell carcinoma of the lung previously treated with chemotherapy which she completed 07/14/2011, CKD, hypertension presented from home secondary to increasing forgetfulness and mental status change for one week. Diagnosed UTI. Difficulty remembering her appointments. Tachycardia in the emergency department. CT of the chest revealed a new 4.1 x 2.7 soft tissue mass in the lower mediastinum. The patient's blood pressure has been soft throughout the hospitalization and he mid 90s. Slightly SOB. MRI- no sign of stroke. MBS completed on 11/22/2010 demonstrated functional swallow during MBS and recommended Dys lvl 3. Coughing noted throughout procedure but not due to aspiration.   Clinical Impression  Pt admitted with above diagnosis. Pt currently with functional limitations due to the deficits listed below (see PT Problem List). Pt hallucinating and had a lot of difficulty getting pt to focus and work with PT.  Attempts to stand unsuccessful.  Will need 24 hour asssit and probably will need NHP if family cannot provide mod assist level.   Pt will benefit from skilled PT to increase their independence and safety with mobility to allow discharge to the venue listed below.      Follow Up Recommendations SNF;Supervision/Assistance - 24 hour    Equipment Recommendations  Other (comment) (TBA)    Recommendations for Other Services       Precautions / Restrictions Precautions Precautions: Fall Restrictions Weight Bearing Restrictions: No      Mobility  Bed Mobility Overal bed mobility: Needs Assistance;+2 for physical assistance Bed Mobility: Rolling;Sidelying to Sit Rolling: Mod assist;Max assist Sidelying to sit: Mod assist;Max assist;+2 for physical assistance       General bed mobility comments:  Pt needed assist and cues for technique as well as a assist for elevation of trunk.  Needed assist to bring LES off bed as well.   Transfers Overall transfer level: Needs assistance Equipment used: Rolling walker (2 wheeled) Transfers: Sit to/from Stand Sit to Stand: Mod assist;+2 physical assistance;Max assist         General transfer comment: Pt unable to achieve standing with +2 assist.  Pt with all her weight posterior to her body and LES sliding out from under her.    Ambulation/Gait             General Gait Details: unable  Stairs            Wheelchair Mobility    Modified Rankin (Stroke Patients Only)       Balance Overall balance assessment: Needs assistance;History of Falls Sitting-balance support: Bilateral upper extremity supported;Feet supported Sitting balance-Leahy Scale: Fair Sitting balance - Comments: Sat EOb 5 minutes without support.     Standing balance support: Bilateral upper extremity supported;During functional activity Standing balance-Leahy Scale: Zero Standing balance comment: Could not stand all the way up due to poor postural stability.                              Pertinent Vitals/Pain Pain Assessment: No/denies pain  BP 101/55 supine, 98/61 sitting and 92/61 on departure with no dizziness noted.  Afib with HR 102-135 bpm.  O2 on RA 98%.      Home Living Family/patient expects to be discharged to:: Private residence Living Arrangements: Spouse/significant other;Children Available Help at Discharge: Family;Available 24 hours/day Type of Home: House Home Access:  Stairs to enter Entrance Stairs-Rails: Right Entrance Stairs-Number of Steps: 3 Home Layout: One level Home Equipment: Hospital bed;Shower seat;Walker - 2 wheels;Cane - single point;Bedside commode Additional Comments: Information regarding 24 hour care per pt.  Unsure if reliable information.  Equipment and stair info were from previous admission so they may  be accurate.      Prior Function Level of Independence: Needs assistance   Gait / Transfers Assistance Needed: states husband walks with her at times.  ADL's / Homemaking Assistance Needed: unsure  Comments: Pt hallucinating and not sure if her answers are accurate.       Hand Dominance        Extremity/Trunk Assessment   Upper Extremity Assessment: Defer to OT evaluation           Lower Extremity Assessment: RLE deficits/detail;LLE deficits/detail RLE Deficits / Details: grossly 2-/5 LLE Deficits / Details: grossly 2-/5  Cervical / Trunk Assessment: Kyphotic  Communication   Communication: No difficulties  Cognition Arousal/Alertness: Awake/alert Behavior During Therapy: Anxious Overall Cognitive Status: History of cognitive impairments - at baseline       Memory: Decreased short-term memory              General Comments General comments (skin integrity, edema, etc.): Pt was talking to people in the room who were not there and talking about objects like a coffee table that wasnt there.      Exercises General Exercises - Lower Extremity Ankle Circles/Pumps: AROM;Both;10 reps;Seated Long Arc Quad: AROM;10 reps;Seated;Both Hip Flexion/Marching: AROM;Both;10 reps;Seated      Assessment/Plan    PT Assessment Patient needs continued PT services  PT Diagnosis Generalized weakness   PT Problem List Decreased activity tolerance;Decreased balance;Decreased mobility;Decreased knowledge of use of DME;Decreased safety awareness;Decreased knowledge of precautions  PT Treatment Interventions DME instruction;Gait training;Functional mobility training;Therapeutic activities;Therapeutic exercise;Balance training;Patient/family education   PT Goals (Current goals can be found in the Care Plan section) Acute Rehab PT Goals Patient Stated Goal: unable to assess, pt hallucinating  PT Goal Formulation: With patient Time For Goal Achievement: 08/27/14 Potential to Achieve  Goals: Good    Frequency Min 2X/week   Barriers to discharge        Co-evaluation               End of Session Equipment Utilized During Treatment: Gait belt Activity Tolerance: Patient limited by fatigue Patient left: in bed;with call bell/phone within reach;with bed alarm set Nurse Communication: Mobility status         Time: 3338-3291 PT Time Calculation (min) (ACUTE ONLY): 24 min   Charges:   PT Evaluation $Initial PT Evaluation Tier I: 1 Procedure PT Treatments $Therapeutic Activity: 8-22 mins   PT G CodesIrwin Brakeman F 09/10/2014, 12:46 PM  Gari Trovato,PT Acute Rehabilitation [phone] (pager)

## 2014-08-20 NOTE — Clinical Social Work Placement (Addendum)
    Clinical Social Work Department CLINICAL SOCIAL WORK PLACEMENT NOTE 08/20/2014  Patient:  Madison [name], Madison [name]  Account Number:  [id number] [place] date:  08/16/2014  Clinical Social Worker:  Berton Mount, Latanya Presser  Date/time:  08/20/2014 04:35 PM  Clinical Social Work is seeking post-discharge placement for this patient at the following level of care:   SKILLED NURSING   (*CSW will update this form in Epic as items are completed)   08/20/2014  Patient/family provided with [place] Department of Clinical Social Work's list of facilities offering this level of care within the geographic area requested by the patient (or if unable, by the patient's family).  08/20/2014  Patient/family informed of their freedom to choose among providers that offer the needed level of care, that participate in Medicare, Medicaid or managed care program needed by the patient, have an available bed and are willing to accept the patient.  08/20/2014  Patient/family informed of [facility]' ownership interest in [facility], as well as of the fact that they are under no obligation to receive care at this facility.  PASARR submitted to EDS on existing PASARR number received on   FL2 transmitted to all facilities in geographic area requested by pt/family on  08/20/2014 FL2 transmitted to all facilities within larger geographic area on   Patient informed that his/her managed care company has contracts with or will negotiate with  certain facilities, including the following:     Patient/family informed of bed offers received:  08/21/2014 Patient chooses bed at [facility] Physician recommends and patient chooses bed at    Patient to be transferred to [place]  on  09/03/2014 Patient to be transferred to facility by [facility] Patient and family notified of transfer on 09/03/2014 Name of family member notified:  Josephina Gip (son)  The following physician request were entered in  Epic: Physician Request  Please sign FL2.    Additional CommentsGlendon Axe, MSW, LCSWA [phone] 08/23/2014 11:11 AM

## 2014-08-20 NOTE — Care Management Note (Addendum)
    Page 1 of 2   09/03/2014     11:12:44 AM CARE MANAGEMENT NOTE 09/03/2014  Patient:  Madison [name], Madison [name]   Account Number:  [id number]  Date Initiated:  08/19/2014  Documentation initiated by:  Elissa Hefty  Subjective/Objective Assessment:   adm w heart failure     Action/Plan:   lives w fam,pcp dr Shanon Brow chism   Anticipated DC Date:     Anticipated DC Plan:  [place]         Choice offered to / List presented to:             Status of service:   Medicare Important Message given?  YES (If response is "NO", the following Medicare IM given date fields will be blank) Date Medicare IM given:  08/21/2014 Medicare IM given by:  GRAVES-BIGELOW,Jamiria Langill Date Additional Medicare IM given:  08/25/2014 Additional Medicare IM given by:  Hager Compston GRAVES-BIGELOW  Discharge Disposition:  [place]  Per UR Regulation:  Reviewed for med. necessity/level of care/duration of stay  If discussed at [place] of Stay Meetings, dates discussed:   08/21/2014  08/21/2014  08/26/2014  08/28/2014  09/02/2014  09/04/2014    Comments:   09-03-14 Pt transferred to [facility] today. Jacqlyn Krauss, RN,BSN [phone]   09-01-14 [place], RN,BSN [phone] Medicare Important Message given? YES (If response is "NO", the following Medicare IM given date fields will be blank) Date Medicare IM given: Medicare IM given by: Jacqlyn Krauss     08-29-14 [address] RN,BSN [phone] Medicare Important Message given? YES (If response is "NO", the following Medicare IM given date fields will be blank) Date Medicare IM given: Medicare IM given by: Jacqlyn Krauss      08-28-14 [address] RN,BSN [phone] Cr elevated; IVF initiated. CM will continue to monitor.   08-25-14 [place], RN,BSN [phone] Per MD plan for pt to D/C to SNF in am if HR stable. CSW to assist with  disposition needs.  08-20-14 1543 Jacqlyn Krauss, RN,BSN [phone]  Pt being treated for PNA IV Antibiotic therapy. Pt with new afib and hypotension. Treated with IV digoxin. CM to monitor for disposition. CSW to assist wit Plans for SNF.

## 2014-08-20 NOTE — Progress Notes (Signed)
Patient Name: Madison [name] Date of Encounter: 08/20/2014   Principal Problem:   Lung cancer Active Problems:   CKD (chronic kidney disease) stage 3, GFR 30-59 ml/min   Acute systolic congestive heart failure, NYHA class 4   Protein-calorie malnutrition, severe   PAF (paroxysmal atrial fibrillation)   Metabolic acidosis   UTI (lower urinary tract infection)   Altered mental status   Acute encephalopathy   Tachycardia   Elevated troponin   Anemia   Hyperkalemia   Abnormal EKG    SUBJECTIVE  No chest pain or sob.  Hypothermic overnight with rectal temp of 95.3.  Bear hugger in place.    CURRENT MEDS . antiseptic oral rinse  7 mL Mouth Rinse BID  . aspirin  81 mg Oral Daily  . enoxaparin (LOVENOX) injection  30 mg Subcutaneous Q24H  . folic acid  1 mg Oral Daily  . piperacillin-tazobactam (ZOSYN)  IV  2.25 g Intravenous Q6H  . sodium chloride  3 mL Intravenous Q12H  . vancomycin  500 mg Intravenous Q48H    OBJECTIVE  Filed Vitals:   08/20/14 0405 08/20/14 0500 08/20/14 0530 08/20/14 0732  BP: 97/55 92/51 88/66  100/68  Pulse: 97 87    Temp:   98.2 F (36.8 C)   TempSrc:   Rectal   Resp: 21 21 24 17   Height:      Weight:  131 lb (59.421 kg)    SpO2: 97% 94% 95%     Intake/Output Summary (Last 24 hours) at 08/20/14 0802 Last data filed at 08/19/14 2000  Gross per 24 hour  Intake 1496.67 ml  Output      0 ml  Net 1496.67 ml   Filed Weights   08/16/14 2212 08/19/14 2100 08/20/14 0500  Weight: 102 lb 9.6 oz (46.539 kg) 123 lb 3.2 oz (55.883 kg) 131 lb (59.421 kg)    PHYSICAL EXAM  General: Pleasant, NAD. Neuro: Alert and oriented. Moves all extremities spontaneously. Psych: Normal affect. HEENT:  Normal  Neck: Supple without bruits or JVD. Lungs:  Resp regular and unlabored, diminished breath sounds bilat. Heart: Mostly regular, tachy, no s3, s4, or murmurs. Abdomen: Soft, non-tender, non-distended, BS + x 4.  Extremities: No clubbing, cyanosis or  edema. DP/PT/Radials 2+ and equal bilaterally.  Accessory Clinical Findings  CBC  Recent Labs  08/18/14 0550 08/19/14 0600  WBC 13.5* 11.1*  HGB 11.9* 11.5*  HCT 34.3* 33.1*  MCV 84.3 86.4  PLT PLATELET CLUMPS NOTED ON SMEAR, COUNT APPEARS DECREASED 99*   Basic Metabolic Panel  Recent Labs  08/19/14 0600 08/19/14 1533 08/20/14 0020  NA 137 141 141  K 5.3* 3.9 3.1*  CL 118* 122* 113*  CO2 12* 13* 15*  GLUCOSE 120* 131* 143*  BUN 36* 35* 33*  CREATININE 2.40* 2.41* 2.57*  CALCIUM 8.2* 8.2* 8.1*  MG 1.1*  --   --    Cardiac Enzymes  Recent Labs  08/17/14 1025 08/18/14 0900  TROPONINI 0.28* 0.16*   Thyroid Function Tests  Recent Labs  08/17/14 1025  TSH 2.900    TELE  Sinus rhythm/sinus tach with frequent PAC's  ECG  Sinus tachycardia, 123, frequent pac's, 1st deg avb, inf/antlat twi, prolonged QT.  Radiology/Studies  Dg Chest Port 1 View  08/19/2014   CLINICAL DATA:  Acute respiratory failure  EXAM: PORTABLE CHEST - 1 VIEW  COMPARISON:  CT chest dated 08/17/2014  FINDINGS: Retrocardiac opacity, suspicious for pneumonia.  Patchy right lower lobe opacity, possibly atelectasis.  Small bilateral pleural effusions. No frank interstitial edema.  The heart is top-normal in size.  Right chest power port terminates cavoatrial junction.  IMPRESSION: Retrocardiac opacity, suspicious for pneumonia.  Small bilateral pleural effusions.   Electronically Signed   By: Julian Hy M.D.   On: 08/19/2014 10:11  _____________   ASSESSMENT AND PLAN  This is a 69 y.o. female with a past medical history significant for stage III lung cancer with possible recurrence, HTN, PAF, and CKD3, who presented to [facility] 08/16/14 with AMS. She was also found to have significant anemia requiring 2U transfusion, UTI and CT of the abdomen and pelvis did show a 3.5 x 3 cm necrotic lymph node. Troponin was checked and mildly elevated. EKG shows persistent anterolateral T-wave inversions which  are new compared to her most recent office visit in 2015. Cardiology is asked to consult regarding elevated troponin and abnormal EKG.   1. 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 multivessel coronary disease vs dilated CM vs Takatsubo cardiomyopathy. Euvolemic on exam.  Plus 1.5 L I/O yesterday, plus 4.7 L for admission.  ? Accuracy of weights - now listed @ 131, up from 123 yesterday (was 129 on 1/9).  No chest pain or dyspnea.  CXR this AM w/o edema.  As previously noted, troponin had a flat trend  less likely to be related to acute coronary syndrome and more likely related to heart failure, though volume currently stable. Given her multiple comorbidities including a history of lung cancer with possible recurrence, stage III or 4 chronic kidney disease, urinary tract infection and recent anemia she is not a good candidate for cardiac catheterization or bypass surgery if she would be found to have multivessel coronary disease. Based on this, stress testing would be of little utility. Cont medical Rx  ASA only at this point as relative hypotension prevents [place] from using bb/arb/acei/arni/spiro.  2. Elevated troponin- Conservative therapy.  See #1.  3.  Hypotension - Stable.  Required fluid bolus yesterday.  Coreg on hold.  4.  Stage III lung cancer-she will need restaging of her lung cancer and if it is found to have recurrent lung cancer, given her cardiomyopathy, hospice care should be considered  5.  PAF- appears to be sinus, 1st deg avb, and frequent PAC's with rates in the 90's to low 100's.  Asymptomatic.  She was apparently treated with warfarin in 2012 however had no recurrence of A. fib and anticoagulation was stopped in 2013. Given possible recurrence of lung cancer and the fact that she was profoundly anemic on admission without a clear source, I do not feel that she is a good candidate for long-term warfarin.  Continue ASA.  Received IV digoxin this AM.  If she requires PO digoxin, will have to follow level carefully in setting of acute on chronic renal failure.  6.  Anemia- She was found to be significantly anemic and transfused 2 units of packed red blood cells. H/H pending this AM.  7.  CKD- baseline creat looks to be around 2. Creat up further today to 2.57 in setting of relative hypotension.  8.  Hyperkalemia/Hypokalemia- s/p kayexalate.  Now hypokalemic.  Supp.  9.  UTI- Abx per IM.  10. Hypothermia- Bear hugger in place since last night - temp 95.3.  Currently 98.2.  Signed, Murray Hodgkins NP   I have personally seen and examined patient and agree with note as outlined  above by Ignacia Bayley, NP.  Patient currently in NSR with frequent PAC's.  In looking back at her monitor she ranges from NSR to sinus tach with very frequent PAC's and what looks like occasional multifocal atrial tachycardia.  BP too soft right now for BB therapy.  Nothing new to add at this time.  Signed: Fransico Him, MD [facility] 08/20/2014

## 2014-08-20 NOTE — Progress Notes (Signed)
Speech Language Pathology Treatment: Dysphagia  Patient Details Name: Madison [name] MRN: [id number] DOB: 24-Mar-1946 Today's Date: 08/20/2014 Time: 4503-8882 SLP Time Calculation (min) (ACUTE ONLY): 21 min  Assessment / Plan / Recommendation Clinical Impression  F/u after yesterday's swallow assessment.  Pt with persisting confusion, calling to Leane Para (husband's name per pt), who was not present,  and asking if he wants her lunch.  Assisted with meal - pt with inattention, difficulty focusing/sustaining concentration to POs.  Demonstrates intermittent oral holding of POs - generally liquids- up to 45 seconds, requiring mod cues to swallow and persist through meal.   Swallow appears to be safe pharyngeally - no overt s/s of aspiration.  Minimal intake, however, due to inattention.  SLP will continue to follow.     HPI HPI: 69 year old female with a history of squamous cell carcinoma of the lung previously treated with chemotherapy which she completed 07/14/2011, CKD, hypertension presented from home secondary to increasing forgetfulness and mental status change for one week. Diagnosed UTI. Difficulty remembering her appointments. Tachycardia in the emergency department. CT of the chest revealed a new 4.1 x 2.7 soft tissue mass in the lower mediastinum. The patient's blood pressure has been soft throughout the hospitalization and he mid 90s. Slightly SOB. MRI- no sign of stroke. MBS completed on 11/22/2010 demonstrated functional swallow during MBS and recommended Dys lvl 3. Coughing noted throughout procedure but not due to  aspiration.    Pertinent Vitals Pain Assessment: No/denies pain  SLP Plan  Continue with current plan of care    Recommendations Diet recommendations: Regular;Thin liquid Liquids provided via: Cup Medication Administration: Other (Comment) (crush pills and give w water per pt's preference) Supervision: Patient able to self feed;Staff to assist with self feeding Compensations:   (cues to swallow) Postural Changes and/or Swallow Maneuvers: Seated upright 90 degrees              Oral Care Recommendations: Oral care BID Follow up Recommendations: None Plan: Continue with current plan of care   Christophe Rising L. Tivis Ringer, [place] CCC/SLP Pager [phone]      Madison [name] 08/20/2014, 12:50 PM

## 2014-08-20 NOTE — Progress Notes (Addendum)
NP on call notified of pt having a rectal temp of 95.3. Pt was also confused. New orders to apply warming blanket. Per Np goal temp is 97.5. Will continue to monitor the pt. Hoover Brunette, RN   Np notified of pt's temp reaching the goal & being at 97.9 rectally. However pt's hr is ranging from 90s-120s non-sustaining around 0315. Pt looks to be ST.  Pt still very confused & becoming combative at time. New orders for 250cc bolus. Will continue to monitor the pt. Hoover Brunette, RN   NP notified around 0530 of pt's hr ranging from the 90s-130s still. Pt looks to be flipping between a-fib. Pt's VS 88/66, 98.2 temp rectally, hr 121, resp 24, 95 % on RA. New orders for stat EKG. EKG showed a-fib rvr & prolonged qtc. NP made aware. Will continue to monitor the pt. Hoover Brunette, RN

## 2014-08-20 NOTE — Consult Note (Signed)
PULMONARY / CRITICAL CARE MEDICINE   Name: Madison [name] MRN: [id number] DOB: [birth year]    ADMISSION DATE:  08/16/2014 CONSULTATION DATE:  1/12  REFERRING MD :  Tat   CHIEF COMPLAINT:  Hypotension   INITIAL PRESENTATION:  69 year old female  W/ h/o NSCLC s/p rx 2012-->observation status since then. Admitted on 1/9 w/ working dx encephalopathy felt multifactorial in setting of UTI, sepsis, metabolic acidosis and possibly meds. Course notable for mild trop bump and new systolic HF w/ EF 83-41%. CT findings in addition to possible LLL PNA also concerning for new LN metastasis near thoracic aorta. Developed new onset AF/RVR on 1/12 and new hypotension. PCCM asked to a/w care.    STUDIES:  CT abd/Pelvis 1/9: No evidence of bowel obstruction. Normal appendix. Cholelithiasis, without associated inflammatory changes. No renal, ureteral, or bladder calculi.3.5 x 3.0 cm suspected necrotic node adjacent to the descending thoracic aorta, incompletely visualized. Additional 1.8 cm short axis gastrohepatic node. These are new from prior CT and suspicious for nodal metastases. CT chest 1/10: 1. Large soft tissue mass in the lower middle mediastinum measuring up to 4.1 x 2.7 cm, likely to represent an enlarged lymph node.Given the gastrohepatic ligament lymphadenopathy, findings are concerning for potential nodal spread of disease in this patient with history of lung cancer, but are nonspecific. 2. New areas of volume loss, architectural distortion airspace consolidation in the left lower lobe, compatible with developing pneumonia or sequela of recent aspiration (this appears new compared to yesterday's examination). 3. Small bilateral pleural effusions layering dependently, also new compared to yesterday's examination. 4. Chronic postradiation changes of mass-like fibrosis in the perihilar aspect of the right lung. 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 Wall thickness was normal. Systolic function was severely reduced. The estimated ejection fraction was in the range of 20% to 25%. Aortic valve: Mildly dilated non coronary sinus of valsalva.Atrial septum: No defect or patent foramen ovale was identified.  SIGNIFICANT EVENTS:   SUBJECTIVE:  Received 1.5L IVF 1/12 with improvement in BP and clearance of her lactate.    VITAL SIGNS: Temp:  [95 F (35 C)-98.5 F (36.9 C)] 98.5 F (36.9 C) (01/13 1242) Pulse Rate:  [78-118] 117 (01/13 1205) Resp:  [14-24] 18 (01/13 1205) BP: (88-111)/(40-80) 100/67 mmHg (01/13 1205) SpO2:  [94 %-100 %] 99 % (01/13 1205) Weight:  [55.883 kg (123 lb 3.2 oz)-59.421 kg (131 lb)] 59.421 kg (131 lb) (01/13 0500) HEMODYNAMICS:   VENTILATOR SETTINGS:   INTAKE / OUTPUT:  Intake/Output Summary (Last 24 hours) at 08/20/14 1357 Last data filed at 08/20/14 1331  Gross per 24 hour  Intake 1639.67 ml  Output      0 ml  Net 1639.67 ml    PHYSICAL EXAMINATION: General:  Frail 69 year old female, confused but f/c and not in distress.  Neuro:  Awake, oriented only X 1, no focal def  HEENT:  [place], no JVD, MMM Cardiovascular:  rrr Lungs:  Decreased  Abdomen:  Non-tender + bowel sounds  Musculoskeletal:  Intact  Skin:  Dry and intact   LABS:  CBC  Recent Labs Lab 08/18/14 0550 08/19/14 0600 08/20/14 0830  WBC 13.5* 11.1* 10.2  HGB 11.9* 11.5* 10.0*  HCT 34.3* 33.1* 27.8*  PLT PLATELET CLUMPS NOTED ON SMEAR, COUNT APPEARS DECREASED 99* 88*   Coag's  Recent Labs Lab 08/19/14 0903  APTT 37  INR 1.46   BMET  Recent Labs Lab 08/19/14 1533 08/20/14 0020 08/20/14 0830  NA 141 141 139  K 3.9 3.1* 2.8*  CL 122* 113* 107  CO2 13* 15* 17*  BUN 35* 33* 34*  CREATININE 2.41* 2.57* 2.60*  GLUCOSE 131* 143* 135*   Electrolytes  Recent  Labs Lab 08/19/14 0600 08/19/14 1533 08/20/14 0020 08/20/14 0830  CALCIUM 8.2* 8.2* 8.1* 7.7*  MG 1.1*  --   --   --    Sepsis Markers  Recent Labs Lab 08/17/14 1025 08/19/14 0848 08/19/14 0903 08/19/14 1535  LATICACIDVEN 1.5  --  2.4* 1.9  PROCALCITON  --  0.43  --   --    ABG No results for input(s): PHART, PCO2ART, PO2ART in the last 168 hours. Liver Enzymes  Recent Labs Lab 08/16/14 1607  AST 16  ALT 13  ALKPHOS 64  BILITOT 1.5*  ALBUMIN 2.8*   Cardiac Enzymes  Recent Labs Lab 08/17/14 0310 08/17/14 1025 08/18/14 0900  TROPONINI 0.23* 0.28* 0.16*   Glucose  Recent Labs Lab 08/19/14 2133  GLUCAP 133*    Imaging Dg Chest Port 1 View  08/19/2014   CLINICAL DATA:  Acute respiratory failure  EXAM: PORTABLE CHEST - 1 VIEW  COMPARISON:  CT chest dated 08/17/2014  FINDINGS: Retrocardiac opacity, suspicious for pneumonia.  Patchy right lower lobe opacity, possibly atelectasis. Small bilateral pleural effusions. No frank interstitial edema.  The heart is top-normal in size.  Right chest power port terminates cavoatrial junction.  IMPRESSION: Retrocardiac opacity, suspicious for pneumonia.  Small bilateral pleural effusions.   Electronically Signed   By: Julian Hy M.D.   On: 08/19/2014 10:11     ASSESSMENT / PLAN:  PULMONARY OETT A: LLL PNA vs ATX Small pleural effusions  H/o Lung cancer (NSCLC) observation status since 2012, now w/ CT evidence of recurrence  No acute distress. Does NOT appear volume overloaded.  P:   Wean FIO2 Pulm hygiene  F/u cxr  Will need new PET scan and f/u oncology to discuss options for rx after d/c  CARDIOVASCULAR CVL A:  New AF w/ RVR -->now back in NSR Acute systolic heart failure -EF 20% w/ global hypokinesis, ? Related to ischemia, ? To chemotherapy Mild troponin elevation  Hypotension: think this is mix of volume depletion and acidosis > improved with volume 1/12 P:  Hold antihypertensives No indication  for CVC at present Would make SBP goal > 90   RENAL A:   Stage III-IV CKD Acute on Chronic renal failure-->worse after lasix  >baseline scr 1.6-1.0 NAG metabolic acidosis  Hyperchloremia  Hyperkalemia -->got Kayexalate  Hypomagnesemia  P:   F/u chem  Agree w/ bicarb supplementation Trend I&O Renal dose meds   GASTROINTESTINAL A:   No acute  P:   Diet as MS and resp status allows   HEMATOLOGIC A:   H/o stage IIIa NSCLC (squamous cell); had been on Observation since 2012 w/ last visit on 1/6 w/ heme/onc  CT chest/abd 1/10 w/ new 3.5x3 cm necrotic appearing LN suspicious for nodal involvement  Anemia of chronic disease s/p 2 units blood after admit  P:  Trend CBC Transfuse for usual ICU guidelines LMWH  Will need f/u with Dr Julien Nordmann, suspect that the L lower hilar opacity is metastatic disease. ? Options for therapy given debilitated state. XRT may be best option.   INFECTIOUS A:   Sepsis UTI Suspected RLL PNA  P:   BCx2 1/12>>> UC 1/9: multiple morphotypes. 95K Cefepime 1/10>>1/12 Flagyl 1/10>>>1/12 Zosyn 1/12>>> vanc 1/12>>>  ENDOCRINE A:   No acute    P:   Trend glucose   NEUROLOGIC A:   Acute encephalopathy: remains confused. Felt multifactorial: sepsis and metabolic derangements +/- meds  P:   RASS goal: 0 Limit sedation  Supportive Care    Baltazar Apo, MD, PhD 08/20/2014, 2:11 PM [facility] [phone] or if no answer [phone]

## 2014-08-20 NOTE — Progress Notes (Signed)
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 Patient had increased difficulty removing remaining appointments, history of loose stools, flank pain and weight loss. On admission she was found to be hyperkalemic with  UTIand sinus tachycardia in the ED. CT scan of the abdomen and pelvis showed necrotic lymph node suspicious for recurrence of her prior lung cancer. She was admitted for further management. Her hemoglobin dropped to 6.9 on the second day of admission. She was transfused with 2 units PRBC. She was also given IV fluids. Hospital course completed due to the remnant of pulmonary infiltrate, new-onset Afib with RVR, hypotension and new onset acute systolic CHF.      Assessment/Plan: Sepsis Patient hypotensive with rapid A. fib. Also hypothermic requiring beer hugger overnight.  Sepsis could be related to UTI versus pneumonia ( ?Aspiration) associated with acute encephalopathy. -empiric vancomycin and Zosyn. Follow blood culture.  New-onset acute systolic CHF EF of 51% with global hypokinesia. Per cardiology differential is broad secondary to  multivessel coronary disease versus diabetic cardiomyopathy versus takatsubo cardiomyopathy. Not a good candidate for cardiac catheterization or bypass surgery given her possible lung cancer recurrence , advanced chronic kidney disease, anemia and sepsis. -Coumadin to continue aspirin only. Added low-dose metoprolol today given persistent tachycardia and improved blood pressure. Not on ACE inhibitor, Lasix or Aldactone due to borderline blood pressure.  New-onset A. fib with RVR Not on rate limiting agents due to hypotension.  BP stable today and will add low dose coreg. Received IV digoxin this am given persistent tachycardia. On baby aspirin. Her CHADS VASC score is 4 . Will decide on anticualgulation based on clincial progress.   Acute encephalopathy  possibly related to sepsis and metabolic acidosis MRI brain negative for stroke or metastases. Monitor for now.  hypokalemia Replenish with KCl. Check magnesium  Elevated troponin Possibly demand ischemia with underlying acute CHF and rapid A. Fib. Continue aspirin.  Hypotension Requiring periodic IV fluid bolus. BP improved. Will resume coreg at lower dose.  Left lower lobe pneumonia with small pleural effusion Continue O2 via nasal cannula. Continue empiric vancomycin and Zosyn. Follow culture.  History of non-small cell lung cancer status post chemotherapy (completed in 2012), with CT evidence of recurrence. CT chest shows large soft tissue mass in the lower middle mediastinum representing an enlarged lymph node . Not in acute distress. Hospitalist discussed with patient's oncologist Dr. Salina April and recommended outpatient  follow-up.  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 Has associated non-anion gap metabolic acidosis. Patient hyperkalemic on admission and received Kayexalate. Now hypokalemic. Replenishing with oral KCl. Avoid nephrotoxins. During by car supplements.  Anemia of  chronic disease Hemoglobin stable after 2 units PRBC transfused  Thrombocytopenia Likely secondary to sepsis. Discontinue heparin products and place SCDs.  Code Status:full code Family Communication: none at bedside Disposition Plan: currently inpt ( stepdown)   Consultants:  PC CM  Cardiology  Procedures:  2-D echo  MRI brain  Antibiotics:  None  HPI/Subjective: Patient seen and examined. Overnight be hypothermic requiring beer hugger. HR elevated and was given a dose of IV digoxin.   Objective: Filed Vitals:   08/20/14  1359  BP: 99/62  Pulse:   Temp:  Resp: 21    Intake/Output Summary (Last 24 hours) at 08/20/14 1503 Last data filed at 08/20/14 1331  Gross per 24 hour  Intake 589.67 ml  Output      0 ml  Net 589.67 ml   Filed Weights   08/16/14 2212 08/19/14 2100 08/20/14 0500  Weight: 46.539 kg (102 lb 9.6 oz) 55.883 kg (123 lb 3.2 oz) 59.421 kg (131 lb)    Exam:   General:  Elderly thin built female lying in bed, confused  HEENT: No pallor, moist oral mucosa  Chest: Diminished breath sounds over bilateral lung bases  CVS: S1 and S2 irregularly irregular, no murmurs, rubs or gallop  Abdomen: Soft, nondistended, nontender, bowel sounds present  Extremities: Warm, no edema  CNS: Awake, but not oriented Data Reviewed: Basic Metabolic Panel:  Recent Labs Lab 08/18/14 0550 08/19/14 0600 08/19/14 1533 08/20/14 0020 08/20/14 0830  NA 139 137 141 141 139  K 4.5 5.3* 3.9 3.1* 2.8*  CL 116* 118* 122* 113* 107  CO2 13* 12* 13* 15* 17*  GLUCOSE 157* 120* 131* 143* 135*  BUN 30* 36* 35* 33* 34*  CREATININE 2.01* 2.40* 2.41* 2.57* 2.60*  CALCIUM 8.8 8.2* 8.2* 8.1* 7.7*  MG  --  1.1*  --   --   --    Liver Function Tests:  Recent Labs Lab 08/16/14 1607  AST 16  ALT 13  ALKPHOS 64  BILITOT 1.5*  PROT 6.8  ALBUMIN 2.8*   No results for input(s): LIPASE, AMYLASE in the last 168 hours.  Recent Labs Lab 08/17/14 1025  AMMONIA 13   CBC:  Recent Labs Lab 08/16/14 1607 08/17/14 0310 08/17/14 1025 08/18/14 0550 08/19/14 0600 08/20/14 0830  WBC 9.2 10.8*  --  13.5* 11.1* 10.2  NEUTROABS 8.2*  --   --   --   --   --   HGB 8.5* 6.9*  --  11.9* 11.5* 10.0*  HCT 25.9* 21.3* 22.8* 34.3* 33.1* 27.8*  MCV 92.5 89.9  --  84.3 86.4 83.5  PLT 212 194  --  PLATELET CLUMPS NOTED ON SMEAR, COUNT APPEARS DECREASED 99* 88*   Cardiac Enzymes:  Recent Labs Lab 08/16/14 1825 08/16/14 2143 08/17/14 0310 08/17/14 1025 08/18/14 0900  TROPONINI 0.19* 0.19* 0.23* 0.28* 0.16*    BNP (last 3 results) No results for input(s): PROBNP in the last 8760 hours. CBG:  Recent Labs Lab 08/19/14 2133  GLUCAP 133*    Recent Results (from the past 240 hour(s))  Urine culture     Status: None   Collection Time: 08/16/14  2:32 PM  Result Value Ref Range Status   Specimen Description URINE, CATHETERIZED  Final   Special Requests NONE  Final   Colony Count   Final    95,000 COLONIES/ML Performed at [facility]    Culture   Final    Multiple bacterial morphotypes present, none predominant. Suggest appropriate recollection if clinically indicated. Performed at [facility]    Report Status 08/18/2014 FINAL  Final  Culture, blood (routine x 2)     Status: None (Preliminary result)   Collection Time: 08/19/14 10:58 AM  Result Value Ref Range Status   Specimen Description BLOOD LEFT WRIST  Final   Special Requests BOTTLES DRAWN AEROBIC ONLY 5CC  Final   Culture   Final           BLOOD CULTURE RECEIVED NO GROWTH TO DATE CULTURE WILL BE HELD FOR 5 DAYS BEFORE ISSUING A  FINAL NEGATIVE REPORT Performed at [facility]    Report Status PENDING  Incomplete  Culture, blood (routine x 2)     Status: None (Preliminary result)   Collection Time: 08/19/14  3:20 PM  Result Value Ref Range Status   Specimen Description BLOOD RIGHT HAND  Final   Special Requests BOTTLES DRAWN AEROBIC ONLY 0.5CC  Final   Culture   Final           BLOOD CULTURE RECEIVED NO GROWTH TO DATE CULTURE WILL BE HELD FOR 5 DAYS BEFORE ISSUING A FINAL NEGATIVE REPORT Performed at [facility]    Report Status PENDING  Incomplete  MRSA PCR Screening     Status: None   Collection Time: 08/19/14  5:36 PM  Result Value Ref Range Status   MRSA by PCR NEGATIVE NEGATIVE Final    Comment:        The GeneXpert MRSA Assay (FDA approved for NASAL specimens only), is one component of a comprehensive MRSA colonization surveillance program. It is not intended to diagnose  MRSA infection nor to guide or monitor treatment for MRSA infections.      Studies: Dg Chest Port 1 View  08/20/2014   CLINICAL DATA:  Shortness of breath, pneumonia  EXAM: PORTABLE CHEST - 1 VIEW  COMPARISON:  08/19/2014  FINDINGS: Cardiomediastinal silhouette is stable. Right IJ Port-A-Cath is unchanged in position. Slight improvement in aeration. Residual left base retrocardiac small atelectasis or infiltrate. No pulmonary edema.  IMPRESSION: Improvement in aeration. No pulmonary edema. Residual left base retrocardiac atelectasis or infiltrate.   Electronically Signed   By: Lahoma Crocker M.D.   On: 08/20/2014 08:18   Dg Chest Port 1 View  08/19/2014   CLINICAL DATA:  Acute respiratory failure  EXAM: PORTABLE CHEST - 1 VIEW  COMPARISON:  CT chest dated 08/17/2014  FINDINGS: Retrocardiac opacity, suspicious for pneumonia.  Patchy right lower lobe opacity, possibly atelectasis. Small bilateral pleural effusions. No frank interstitial edema.  The heart is top-normal in size.  Right chest power port terminates cavoatrial junction.  IMPRESSION: Retrocardiac opacity, suspicious for pneumonia.  Small bilateral pleural effusions.   Electronically Signed   By: Julian Hy M.D.   On: 08/19/2014 10:11    Scheduled Meds: . antiseptic oral rinse  7 mL Mouth Rinse BID  . aspirin  81 mg Oral Daily  . folic acid  1 mg Oral Daily  . metoprolol tartrate  12.5 mg Oral BID  . piperacillin-tazobactam (ZOSYN)  IV  2.25 g Intravenous Q6H  . potassium chloride  40 mEq Oral Q4H  . sodium chloride  3 mL Intravenous Q12H  . vancomycin  500 mg Intravenous Q48H   Continuous Infusions: .  sodium bicarbonate  infusion 1000 mL 100 mL/hr at 08/20/14 1445      Time spent: 35 minutes    Jenene Kauffmann, [facility] Pager [phone] If 7PM-7AM, please contact night-coverage at [email or web address], password [facility] 08/20/2014, 3:03 PM  LOS: 4 days

## 2014-08-21 LAB — CBC
HCT: 28.2 % — ABNORMAL LOW (ref 36.0–46.0)
HEMOGLOBIN: 9.9 g/dL — AB (ref 12.0–15.0)
MCH: 29.5 pg (ref 26.0–34.0)
MCHC: 35.1 g/dL (ref 30.0–36.0)
MCV: 83.9 fL (ref 78.0–100.0)
Platelets: 82 10*3/uL — ABNORMAL LOW (ref 150–400)
RBC: 3.36 MIL/uL — ABNORMAL LOW (ref 3.87–5.11)
RDW: 18.8 % — ABNORMAL HIGH (ref 11.5–15.5)
WBC: 8.8 10*3/uL (ref 4.0–10.5)

## 2014-08-21 LAB — BASIC METABOLIC PANEL
ANION GAP: 18 — AB (ref 5–15)
BUN: 28 mg/dL — ABNORMAL HIGH (ref 6–23)
CALCIUM: 7.9 mg/dL — AB (ref 8.4–10.5)
CHLORIDE: 101 meq/L (ref 96–112)
CO2: 27 mmol/L (ref 19–32)
Creatinine, Ser: 2.57 mg/dL — ABNORMAL HIGH (ref 0.50–1.10)
GFR calc Af Amer: 21 mL/min — ABNORMAL LOW (ref >90)
GFR calc non Af Amer: 18 mL/min — ABNORMAL LOW (ref >90)
GLUCOSE: 134 mg/dL — AB (ref 70–99)
POTASSIUM: 3.7 mmol/L (ref 3.5–5.1)
Sodium: 146 mmol/L — ABNORMAL HIGH (ref 135–145)

## 2014-08-21 LAB — PROCALCITONIN: Procalcitonin: 0.33 ng/mL

## 2014-08-21 MED ORDER — LEVOFLOXACIN 500 MG PO TABS
500.0000 mg | ORAL_TABLET | ORAL | Status: DC
  Administered 2014-08-23: 500 mg via ORAL
  Filled 2014-08-21: qty 1

## 2014-08-21 MED ORDER — LEVOFLOXACIN IN D5W 750 MG/150ML IV SOLN
750.0000 mg | Freq: Once | INTRAVENOUS | Status: AC
  Administered 2014-08-21: 750 mg via INTRAVENOUS
  Filled 2014-08-21: qty 150

## 2014-08-21 NOTE — Progress Notes (Signed)
Subjective: No specific complaints but  She cannot talk due to food in her mouth and she will not swallow or spit out.    Objective: Vital signs in last 24 hours: Temp:  [96.8 F (36 C)-98.5 F (36.9 C)] 97.9 F (36.6 C) (01/14 0400) Pulse Rate:  [78-117] 78 (01/14 0825) Resp:  [16-25] 21 (01/14 0822) BP: (91-113)/(53-75) 111/66 mmHg (01/14 0822) SpO2:  [92 %-99 %] 96 % (01/14 0400) Weight:  [115 lb (52.164 kg)] 115 lb (52.164 kg) (01/14 0400) Weight change: -8 lb 3.2 oz (-3.719 kg) Last BM Date: 08/20/14 Intake/Output from previous day:  +1373 01/13 0701 - 01/14 0700 In: 4235 [P.O.:270; I.V.:1003; IV Piggyback:100] Out: -  Intake/Output this shift:    PE: General:Pleasant affect, NAD Skin:Warm and dry, brisk capillary refill HEENT:normocephalic, sclera clear, mucus membranes moist Heart:S1S2 RRR with premature beats. without murmur, gallup, rub or click Lungs:clear without rales, rhonchi, or wheezes TIR:WERX, non tender, + BS-hyperactive, do not palpate liver spleen or masses Ext:no lower ext edema, 2+ pedal pulses, 2+ radial pulses Neuro:alert and oriented, MAE, follows commands, + facial symmetry  tele:  SR with PACs and PVCs   Lab Results:  Recent Labs  08/20/14 0830 08/21/14 0500  WBC 10.2 8.8  HGB 10.0* 9.9*  HCT 27.8* 28.2*  PLT 88* 82*   BMET  Recent Labs  08/20/14 2210 08/21/14 0500  NA 144 146*  K 4.0 3.7  CL 106 101  CO2 25 27  GLUCOSE 132* 134*  BUN 32* 28*  CREATININE 2.67* 2.57*  CALCIUM 7.8* 7.9*    Recent Labs  08/18/14 0900  TROPONINI 0.16*    Lab Results  Component Value Date   CHOL  11/17/2010    96        ATP III CLASSIFICATION:  <200     mg/dL   Desirable  200-239  mg/dL   Borderline High  >=240    mg/dL   High          TRIG 62 11/17/2010   No results found for: HGBA1C   Lab Results  Component Value Date   TSH 2.900 08/17/2014    Hepatic Function Panel No results for input(s): PROT, ALBUMIN, AST,  ALT, ALKPHOS, BILITOT, BILIDIR, IBILI in the last 72 hours. No results for input(s): CHOL in the last 72 hours. No results for input(s): PROTIME in the last 72 hours.     Studies/Results: Dg Chest Port 1 View  08/20/2014   CLINICAL DATA:  Shortness of breath, pneumonia  EXAM: PORTABLE CHEST - 1 VIEW  COMPARISON:  08/19/2014  FINDINGS: Cardiomediastinal silhouette is stable. Right IJ Port-A-Cath is unchanged in position. Slight improvement in aeration. Residual left base retrocardiac small atelectasis or infiltrate. No pulmonary edema.  IMPRESSION: Improvement in aeration. No pulmonary edema. Residual left base retrocardiac atelectasis or infiltrate.   Electronically Signed   By: Lahoma Crocker M.D.   On: 08/20/2014 08:18   Dg Chest Port 1 View  08/19/2014   CLINICAL DATA:  Acute respiratory failure  EXAM: PORTABLE CHEST - 1 VIEW  COMPARISON:  CT chest dated 08/17/2014  FINDINGS: Retrocardiac opacity, suspicious for pneumonia.  Patchy right lower lobe opacity, possibly atelectasis. Small bilateral pleural effusions. No frank interstitial edema.  The heart is top-normal in size.  Right chest power port terminates cavoatrial junction.  IMPRESSION: Retrocardiac opacity, suspicious for pneumonia.  Small bilateral pleural effusions.   Electronically Signed   By: Julian Hy  M.D.   On: 08/19/2014 10:11    Medications: I have reviewed the patient's current medications. Scheduled Meds: . antiseptic oral rinse  7 mL Mouth Rinse BID  . aspirin  81 mg Oral Daily  . carvedilol  3.125 mg Oral BID WC  . folic acid  1 mg Oral Daily  . piperacillin-tazobactam (ZOSYN)  IV  2.25 g Intravenous Q6H  . sodium chloride  3 mL Intravenous Q12H  . vancomycin  500 mg Intravenous Q48H   Continuous Infusions: .  sodium bicarbonate  infusion 1000 mL Stopped (08/21/14 0855)   PRN Meds:.sodium chloride  Assessment/Plan:  This is a 69 y.o. female with a past medical history significant for stage III lung cancer  with possible recurrence, HTN, PAF, and CKD3, who presented to [facility] 08/16/14 with AMS. She was also found to have significant anemia requiring 2U transfusion, UTI and CT of the abdomen and pelvis did show a 3.5 x 3 cm necrotic lymph node. Troponin was checked and mildly elevated. EKG shows persistent anterolateral T-wave inversions which are new compared to her most recent office visit in 2015. Cardiology was asked to consult regarding elevated troponin and abnormal EKG.   1. 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 multivessel coronary disease vs dilated CM vs Takatsubo cardiomyopathy. Euvolemic on exam.I&O's and weights have not been accurate.  No chest pain or dyspnea. As previously noted, troponin had a flat trend  less likely to be related to acute coronary syndrome and more likely related to heart failure, though volume currently stable. Given her multiple comorbidities including a history of lung cancer with possible recurrence, stage III or 4 chronic kidney disease, urinary tract infection and recent anemia she is not a good candidate for cardiac catheterization or bypass surgery if she would be found to have multivessel coronary disease. Based on this, stress testing would be of little utility. Cont medical Rx  ASA only at this point as relative hypotension prevents [place] from using arb/acei/arni/spiro.  She has intermittently tolerated low dose Coreg.  2. Elevated troponin- Conservative therapy. See #1.  3. Hypotension - Stable. Required fluid bolus on the 12th. Coreg held last night but she was given it this am.    4. Stage III lung cancer-she will need restaging of her lung cancer and if it is found to have recurrent lung cancer, given her cardiomyopathy, hospice care should be considered, CCM has been following  5.? PAF vs. MAT- appears to be sinus, 1st deg avb, and frequent PAC's with rates in the 90's to  low 100's and PVCs. Asymptomatic. She was apparently treated with warfarin in 2012 however had no recurrence of A. fib and anticoagulation was stopped in 2013. Given possible recurrence of lung cancer and the fact that she was profoundly anemic on admission without a clear source, I do not feel that she is a good candidate for long-term warfarin as well as the fact that I'm not sure that all of her arrhythmias this admission are actually MAT. Continue ASA.   6. Anemia- She was found to be significantly anemic and transfused 2 units of packed red blood cells. H/H 9.9/28  7. CKD- baseline creat looks to be around 2. Creat trending downwards and now 2.57 in setting of relative hypotension recently.  8. Hyperkalemia/Hypokalemia- s/p kayexalate. stable  9. UTI- Abx per IM.  10. Hypothermia- Resolved. Currently 98.2.  11. Hypernatremia now at 146  LOS: 5 days   Time spent with pt. :15 minutes. [facility] R  Nurse Practitioner Certified Pager [phone] or after 5pm and on weekends call [phone] 08/21/2014, 8:54 AM   I have personally seen and examined patient and agree with note as outlined by Cecilie Kicks NP.  The patient's BP has improved and she was able to get low dose Coreg this am.  Appears to me in MAT this am.  HR around 108 after just getting Coreg.  Signed: Fransico Him, MD [facility] 08/21/2014

## 2014-08-21 NOTE — Consult Note (Signed)
PULMONARY / CRITICAL CARE MEDICINE   Name: Madison [name] MRN: [id number] DOB: 1946/03/21    ADMISSION DATE:  08/16/2014 CONSULTATION DATE:  1/12  REFERRING MD :  Tat   CHIEF COMPLAINT:  Hypotension   INITIAL PRESENTATION:  69 year old female  W/ h/o NSCLC s/p rx 2012-->observation status since then. Admitted on 1/9 w/ working dx encephalopathy felt multifactorial in setting of UTI, sepsis, metabolic acidosis and possibly meds. Course notable for mild trop bump and new systolic HF w/ EF 02-40%. CT findings in addition to possible LLL PNA also concerning for new LN metastasis near thoracic aorta. Developed new onset AF/RVR on 1/12 and new hypotension. PCCM asked to a/w care.    STUDIES:  CT abd/Pelvis 1/9: No evidence of bowel obstruction. Normal appendix. Cholelithiasis, without associated inflammatory changes. No renal, ureteral, or bladder calculi.3.5 x 3.0 cm suspected necrotic node adjacent to the descending thoracic aorta, incompletely visualized. Additional 1.8 cm short axis gastrohepatic node. These are new from prior CT and suspicious for nodal metastases. CT chest 1/10: 1. Large soft tissue mass in the lower middle mediastinum measuring up to 4.1 x 2.7 cm, likely to represent an enlarged lymph node.Given the gastrohepatic ligament lymphadenopathy, findings are concerning for potential nodal spread of disease in this patient with history of lung cancer, but are nonspecific. 2. New areas of volume loss, architectural distortion airspace consolidation in the left lower lobe, compatible with developing pneumonia or sequela of recent aspiration (this appears new compared to yesterday's examination). 3. Small bilateral pleural effusions layering dependently, also new compared to yesterday's examination. 4. Chronic postradiation changes of mass-like fibrosis in the perihilar aspect of the right lung. 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 Wall thickness was normal. Systolic function was severely reduced. The estimated ejection fraction was in the range of 20% to 25%. Aortic valve: Mildly dilated non coronary sinus of valsalva.Atrial septum: No defect or patent foramen ovale was identified.  SIGNIFICANT EVENTS:   SUBJECTIVE:  BP improved.  Hemodynamically stable.  Still confused (part of this seems to be baseline for her).   VITAL SIGNS: Temp:  [96.8 F (36 C)-98.5 F (36.9 C)] 97.9 F (36.6 C) (01/14 0400) Pulse Rate:  [78-82] 78 (01/14 0825) Resp:  [16-25] 21 (01/14 0822) BP: (91-113)/(53-75) 111/66 mmHg (01/14 0822) SpO2:  [92 %-99 %] 96 % (01/14 0400) Weight:  [52.164 kg (115 lb)] 52.164 kg (115 lb) (01/14 0400) HEMODYNAMICS:   VENTILATOR SETTINGS:   INTAKE / OUTPUT:  Intake/Output Summary (Last 24 hours) at 08/21/14 1221 Last data filed at 08/21/14 0500  Gross per 24 hour  Intake   1370 ml  Output      0 ml  Net   1370 ml    PHYSICAL EXAMINATION: General:  Frail 68 year old female, confused but able to follow commands and in no distress.  Neuro:  Awake, oriented to person only. HEENT:  [place], no JVD, MMM Cardiovascular:  IRIR, no M/R/G.  Right chest port. Lungs:  Resps shallow and unlabored.  No W/R/R. Abdomen:  Non-tender + bowel sounds  Musculoskeletal:  Intact  Skin:  Dry and intact   LABS:  CBC  Recent Labs Lab 08/19/14 0600 08/20/14 0830 08/21/14 0500  WBC 11.1* 10.2 8.8  HGB 11.5* 10.0* 9.9*  HCT 33.1* 27.8* 28.2*  PLT 99*  88* 82*   Coag's  Recent Labs Lab 08/19/14 0903  APTT 37  INR 1.46   BMET  Recent Labs Lab 08/20/14 1605 08/20/14 2210 08/21/14 0500  NA 145 144 146*  K 3.6 4.0 3.7  CL 110 106 101  CO2 22 25 27   BUN 33* 32* 28*  CREATININE 2.71* 2.67* 2.57*  GLUCOSE 135* 132* 134*   Electrolytes  Recent  Labs Lab 08/19/14 0600  08/20/14 1605 08/20/14 2210 08/21/14 0500  CALCIUM 8.2*  < > 8.1* 7.8* 7.9*  MG 1.1*  --  2.2  --   --   < > = values in this interval not displayed. Sepsis Markers  Recent Labs Lab 08/17/14 1025 08/19/14 0848 08/19/14 0903 08/19/14 1535 08/21/14 0500  LATICACIDVEN 1.5  --  2.4* 1.9  --   PROCALCITON  --  0.43  --   --  0.33   ABG No results for input(s): PHART, PCO2ART, PO2ART in the last 168 hours. Liver Enzymes  Recent Labs Lab 08/16/14 1607  AST 16  ALT 13  ALKPHOS 64  BILITOT 1.5*  ALBUMIN 2.8*   Cardiac Enzymes  Recent Labs Lab 08/17/14 0310 08/17/14 1025 08/18/14 0900  TROPONINI 0.23* 0.28* 0.16*   Glucose  Recent Labs Lab 08/19/14 2133  GLUCAP 133*    Imaging Dg Chest Port 1 View  08/20/2014   CLINICAL DATA:  Shortness of breath, pneumonia  EXAM: PORTABLE CHEST - 1 VIEW  COMPARISON:  08/19/2014  FINDINGS: Cardiomediastinal silhouette is stable. Right IJ Port-A-Cath is unchanged in position. Slight improvement in aeration. Residual left base retrocardiac small atelectasis or infiltrate. No pulmonary edema.  IMPRESSION: Improvement in aeration. No pulmonary edema. Residual left base retrocardiac atelectasis or infiltrate.   Electronically Signed   By: Lahoma Crocker M.D.   On: 08/20/2014 08:18    ASSESSMENT / PLAN:  PULMONARY A: LLL PNA vs ATX Small pleural effusions  H/o Lung cancer (NSCLC) observation status since 2012, now w/ CT evidence of recurrence  No acute distress. Does NOT appear volume overloaded.  P:   Pulm hygiene. Will need new PET scan and f/u oncology to discuss options for rx after d/c.  CARDIOVASCULAR R chest port >>> A:  New AF w/ RVR Hypotension - resolved Acute systolic heart failure -EF 20% w/ global hypokinesis, ? Related to ischemia, ? To chemotherapy Mild troponin elevation  P:  On low dose coreg. SBP goal > 90 (improved s/p fluid boluses 1/13).   RENAL A:   Stage III-IV CKD Acute on  Chronic renal failure--> worse after lasix  NAG metabolic acidosis  Hyperchloremia  - resolved Hyperkalemia - resolved Hypomagnesemia - resolved P:   Avoid nephrotoxic agents.  GASTROINTESTINAL A:   No acute  P:   Diet as MS and resp status allows.  HEMATOLOGIC A:   H/o stage IIIa NSCLC (squamous cell); had been on Observation since 2012 w/ last visit on 1/6 w/ heme/onc  CT chest/abd 1/10 w/ new 3.5x3 cm necrotic appearing LN suspicious for nodal involvement  Anemia of chronic disease s/p 2 units blood after admit  P:  Transfuse per usual guidelines. LMW. Will need f/u with Dr Julien Nordmann, suspect that the L lower hilar opacity is metastatic disease. ? Options for therapy given debilitated state. XRT may be best option.   INFECTIOUS A:   Sepsis UTI Suspected RLL PNA  P:   BCx2 1/12>>> UC 1/9: multiple morphotypes. 95K Cefepime 1/10>>1/12 Flagyl 1/10>>>1/12 Zosyn 1/12>>>1/14 vanc 1/12>>>1/4 Levaquin 1/14 >>>  ENDOCRINE A:   No acute   P:   Monitor glucose.  NEUROLOGIC A:   Acute encephalopathy: remains confused. Felt multifactorial: ? Baseline, sepsis and metabolic derangements +/- meds  P:   Limit sedation. Supportive Care.  BP improved after fluid boluses.  PCCM will sign off.  Please do not hesitate to call us back if we can be of any further assistance.   Montey Hora, [facility] Pgr: [phone]  or [phone] 08/21/2014, 12:30 PM     Attending Note:  I have examined patient, reviewed labs, studies and notes. I have discussed the case with Jun[place], and I agree with the data and plans as amended above.  Baltazar Apo, MD, PhD 08/22/2014, 2:19 PM [facility] [phone] or if no answer [phone]

## 2014-08-21 NOTE — Progress Notes (Signed)
ANTIBIOTIC CONSULT NOTE   Pharmacy Consult for Levaquin Indication: PNA  Allergies  Allergen Reactions  . Lisinopril Swelling    Makes face swell   Assessment: 69 y/o female previously on vancomycin and Zosyn for sepsis, PNA, UTI to transition to Levaquin. Renal function is stable with SCr 2.57, est CrCl 16.8 ml/min. She is afebrile and WBC are normal.  Cefepime 1/10>> 1/12 Flagyl IV 1/10>> 1/12 vanc 1/12>>1/14 Zosyn 1/12>>1/14 Levaquin 1/14>>  1/12 blood x2 - ngtd 1/9 Urine - 95 K/ml, multiple species  Plan: Levaquin 750 mg IV once then 500 mg PO q48h Pharmacy signing off, please re-consult if needed  [facility], [place].D., BCPS Clinical Pharmacist Pager: [phone] 08/21/2014 12:53 PM

## 2014-08-21 NOTE — Progress Notes (Addendum)
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 Patient had increased difficulty removing remaining appointments, history of loose stools, flank pain and weight loss. On admission she was found to be hyperkalemic with  UTIand sinus tachycardia in the ED. CT scan of the abdomen and pelvis showed necrotic lymph node suspicious for recurrence of her prior lung cancer. She was admitted for further management. Her hemoglobin dropped to 6.9 on the second day of admission. She was transfused with 2 units PRBC. She was also given IV fluids. Hospital course completed due to the remnant of pulmonary infiltrate, new-onset Afib with RVR, hypotension and new onset acute systolic CHF.      Assessment/Plan: Sepsis Patient hypotensive and hypothermic  with rapid A. fib.   Sepsis could be related to UTI versus pneumonia ( ?Aspiration) associated with acute encephalopathy. -on empiric vancomycin and Zosyn. Blood cx negative. Will narrow abx to levaquin.  New-onset acute systolic CHF EF of 25% with global hypokinesia. Per cardiology differential is broad secondary to  multivessel coronary disease versus diabetic cardiomyopathy versus takatsubo cardiomyopathy. Not a good candidate for cardiac catheterization or bypass surgery given her possible lung cancer recurrence , advanced chronic kidney disease, anemia and sepsis. -Coumadin to continue aspirin only. Added low-dose Coreg . BP better and rate controlled. - Not on ACE inhibitor, Lasix or Aldactone due to borderline blood pressure and AKI.  ? New-onset A. fib with RVR -tolerating low dose coreg. HR stable today. On baby aspirin. Her CHADS  VASC score is 4 . Was on coumadin in 2012 for afib which was dced as she did not have any recurrence. Given anemia and recurrence of lung ca, cardiology feel she is not a candidate for long term anticoagulation.  Acute encephalopathy  possibly related to sepsis and metabolic acidosis. Not improved. MRI brain negative for stroke or metastases. Monitor for now.  hypokalemia Replenished with KCl.   Elevated troponin Possibly demand ischemia with underlying acute CHF and rapid A. Fib. Continue aspirin.  Hypotension Requiring periodic IV fluid bolus. BP improved. D/c fluids  Left lower lobe pneumonia with small pleural effusion Continue O2 via nasal cannula. Transition to  oral levaquin  History of non-small cell lung cancer status post chemotherapy (completed in 2012), with CT evidence of recurrence. CT chest shows large soft tissue mass in the lower middle mediastinum representing an enlarged lymph node . Not in acute distress. Hospitalist discussed with patient's oncologist Dr. Julien Nordmann and recommended outpatient  follow-up.  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 Non anion gap acidosis resolved.  Avoid nephrotoxins. D/c IV bicarb.   Anemia of  chronic disease Hemoglobin stable after 2 units PRBC transfused  Thrombocytopenia Likely secondary to sepsis. Discontinue heparin products and place SCDs.  Code Status:full code Family Communication: none at bedside Disposition Plan: Transfer to tele may d/c to SNF in 24-48 hrs if improving   Consultants:  PC CM  Cardiology  Procedures:  2-D echo  MRI brain  Antibiotics:  None  HPI/Subjective: Patient seen and examined. HR more stable and BP improved. Tolerating low dose coreg.   Objective: Filed Vitals:   08/21/14 0825  BP:   Pulse: 78  Temp:   Resp:  Intake/Output Summary (Last 24 hours) at 08/21/14 1203 Last data filed at 08/21/14 0500  Gross per 24 hour  Intake   1370  ml  Output      0 ml  Net   1370 ml   Filed Weights   08/19/14 2100 08/20/14 0500 08/21/14 0400  Weight: 55.883 kg (123 lb 3.2 oz) 59.421 kg (131 lb) 52.164 kg (115 lb)    Exam:   General:  Elderly thin built female lying in bed, confused  HEENT: No pallor, moist oral mucosa  Chest: Diminished breath sounds over bilateral lung bases  CVS: S1 and S2  irregular, no murmurs, rubs or gallop  Abdomen: Soft, nondistended, nontender, bowel sounds present  Extremities: Warm, no edema  CNS: AAOX0 Data Reviewed: Basic Metabolic Panel:  Recent Labs Lab 08/19/14 0600  08/20/14 0020 08/20/14 0830 08/20/14 1605 08/20/14 2210 08/21/14 0500  NA 137  < > 141 139 145 144 146*  K 5.3*  < > 3.1* 2.8* 3.6 4.0 3.7  CL 118*  < > 113* 107 110 106 101  CO2 12*  < > 15* 17* 22 25 27   GLUCOSE 120*  < > 143* 135* 135* 132* 134*  BUN 36*  < > 33* 34* 33* 32* 28*  CREATININE 2.40*  < > 2.57* 2.60* 2.71* 2.67* 2.57*  CALCIUM 8.2*  < > 8.1* 7.7* 8.1* 7.8* 7.9*  MG 1.1*  --   --   --  2.2  --   --   < > = values in this interval not displayed. Liver Function Tests:  Recent Labs Lab 08/16/14 1607  AST 16  ALT 13  ALKPHOS 64  BILITOT 1.5*  PROT 6.8  ALBUMIN 2.8*   No results for input(s): LIPASE, AMYLASE in the last 168 hours.  Recent Labs Lab 08/17/14 1025  AMMONIA 13   CBC:  Recent Labs Lab 08/16/14 1607 08/17/14 0310 08/17/14 1025 08/18/14 0550 08/19/14 0600 08/20/14 0830 08/21/14 0500  WBC 9.2 10.8*  --  13.5* 11.1* 10.2 8.8  NEUTROABS 8.2*  --   --   --   --   --   --   HGB 8.5* 6.9*  --  11.9* 11.5* 10.0* 9.9*  HCT 25.9* 21.3* 22.8* 34.3* 33.1* 27.8* 28.2*  MCV 92.5 89.9  --  84.3 86.4 83.5 83.9  PLT 212 194  --  PLATELET CLUMPS NOTED ON SMEAR, COUNT APPEARS DECREASED 99* 88* 82*   Cardiac Enzymes:  Recent Labs Lab 08/16/14 1825 08/16/14 2143 08/17/14 0310 08/17/14 1025 08/18/14 0900  TROPONINI 0.19* 0.19* 0.23* 0.28* 0.16*   BNP (last 3 results) No  results for input(s): PROBNP in the last 8760 hours. CBG:  Recent Labs Lab 08/19/14 2133  GLUCAP 133*    Recent Results (from the past 240 hour(s))  Urine culture     Status: None   Collection Time: 08/16/14  2:32 PM  Result Value Ref Range Status   Specimen Description URINE, CATHETERIZED  Final   Special Requests NONE  Final   Colony Count   Final    95,000 COLONIES/ML Performed at [facility]    Culture   Final    Multiple bacterial morphotypes present, none predominant. Suggest appropriate recollection if clinically indicated. Performed at [facility]    Report Status 08/18/2014 FINAL  Final  Culture, blood (routine x 2)     Status: None (Preliminary result)   Collection Time: 08/19/14 10:58 AM  Result Value Ref Range Status  Specimen Description BLOOD LEFT WRIST  Final   Special Requests BOTTLES DRAWN AEROBIC ONLY 5CC  Final   Culture   Final           BLOOD CULTURE RECEIVED NO GROWTH TO DATE CULTURE WILL BE HELD FOR 5 DAYS BEFORE ISSUING A FINAL NEGATIVE REPORT Performed at [facility]    Report Status PENDING  Incomplete  Culture, blood (routine x 2)     Status: None (Preliminary result)   Collection Time: 08/19/14  3:20 PM  Result Value Ref Range Status   Specimen Description BLOOD RIGHT HAND  Final   Special Requests BOTTLES DRAWN AEROBIC ONLY 0.5CC  Final   Culture   Final           BLOOD CULTURE RECEIVED NO GROWTH TO DATE CULTURE WILL BE HELD FOR 5 DAYS BEFORE ISSUING A FINAL NEGATIVE REPORT Performed at [facility]    Report Status PENDING  Incomplete  MRSA PCR Screening     Status: None   Collection Time: 08/19/14  5:36 PM  Result Value Ref Range Status   MRSA by PCR NEGATIVE NEGATIVE Final    Comment:        The GeneXpert MRSA Assay (FDA approved for NASAL specimens only), is one component of a comprehensive MRSA colonization surveillance program. It is not intended to diagnose MRSA infection nor to guide  or monitor treatment for MRSA infections.      Studies: Dg Chest Port 1 View  08/20/2014   CLINICAL DATA:  Shortness of breath, pneumonia  EXAM: PORTABLE CHEST - 1 VIEW  COMPARISON:  08/19/2014  FINDINGS: Cardiomediastinal silhouette is stable. Right IJ Port-A-Cath is unchanged in position. Slight improvement in aeration. Residual left base retrocardiac small atelectasis or infiltrate. No pulmonary edema.  IMPRESSION: Improvement in aeration. No pulmonary edema. Residual left base retrocardiac atelectasis or infiltrate.   Electronically Signed   By: Lahoma Crocker M.D.   On: 08/20/2014 08:18    Scheduled Meds: . antiseptic oral rinse  7 mL Mouth Rinse BID  . aspirin  81 mg Oral Daily  . carvedilol  3.125 mg Oral BID WC  . folic acid  1 mg Oral Daily  . piperacillin-tazobactam (ZOSYN)  IV  2.25 g Intravenous Q6H  . sodium chloride  3 mL Intravenous Q12H  . vancomycin  500 mg Intravenous Q48H   Continuous Infusions: .  sodium bicarbonate  infusion 1000 mL Stopped (08/21/14 0855)      Time spent: 35 minutes    Geneviene Tesch, [place] Pager [phone] If 7PM-7AM, please contact night-coverage at [email or web address], password [facility] 08/21/2014, 12:03 PM  LOS: 5 days

## 2014-08-22 DIAGNOSIS — I4719 Other supraventricular tachycardia: Secondary | ICD-10-CM | POA: Insufficient documentation

## 2014-08-22 DIAGNOSIS — D649 Anemia, unspecified: Secondary | ICD-10-CM

## 2014-08-22 DIAGNOSIS — I471 Supraventricular tachycardia: Secondary | ICD-10-CM

## 2014-08-22 DIAGNOSIS — D696 Thrombocytopenia, unspecified: Secondary | ICD-10-CM | POA: Insufficient documentation

## 2014-08-22 LAB — CLOSTRIDIUM DIFFICILE BY PCR: Toxigenic C. Difficile by PCR: NEGATIVE

## 2014-08-22 LAB — BASIC METABOLIC PANEL
Anion gap: 11 (ref 5–15)
BUN: 30 mg/dL — AB (ref 6–23)
CO2: 28 mmol/L (ref 19–32)
Calcium: 7.8 mg/dL — ABNORMAL LOW (ref 8.4–10.5)
Chloride: 103 mEq/L (ref 96–112)
Creatinine, Ser: 2.6 mg/dL — ABNORMAL HIGH (ref 0.50–1.10)
GFR calc non Af Amer: 18 mL/min — ABNORMAL LOW (ref >90)
GFR, EST AFRICAN AMERICAN: 20 mL/min — AB (ref >90)
Glucose, Bld: 97 mg/dL (ref 70–99)
Potassium: 3.3 mmol/L — ABNORMAL LOW (ref 3.5–5.1)
Sodium: 142 mmol/L (ref 135–145)

## 2014-08-22 LAB — CBC
HEMATOCRIT: 30.3 % — AB (ref 36.0–46.0)
Hemoglobin: 10.7 g/dL — ABNORMAL LOW (ref 12.0–15.0)
MCH: 29.6 pg (ref 26.0–34.0)
MCHC: 35.3 g/dL (ref 30.0–36.0)
MCV: 83.9 fL (ref 78.0–100.0)
PLATELETS: 61 10*3/uL — AB (ref 150–400)
RBC: 3.61 MIL/uL — ABNORMAL LOW (ref 3.87–5.11)
RDW: 18.7 % — ABNORMAL HIGH (ref 11.5–15.5)
WBC: 10.2 10*3/uL (ref 4.0–10.5)

## 2014-08-22 MED ORDER — POTASSIUM CHLORIDE CRYS ER 20 MEQ PO TBCR
40.0000 meq | EXTENDED_RELEASE_TABLET | Freq: Once | ORAL | Status: AC
  Administered 2014-08-22: 40 meq via ORAL
  Filled 2014-08-22: qty 2

## 2014-08-22 MED ORDER — POTASSIUM CHLORIDE 20 MEQ PO PACK
40.0000 meq | PACK | Freq: Once | ORAL | Status: DC
  Filled 2014-08-22: qty 2

## 2014-08-22 NOTE — Progress Notes (Signed)
Speech Language Pathology Treatment: Dysphagia  Patient Details Name: Madison [name] MRN: [id number] DOB: 03-20-1946 Today's Date: 08/22/2014 Time: 4098-1191 SLP Time Calculation (min) (ACUTE ONLY): 33 min  Assessment / Plan / Recommendation Clinical Impression  Pt initially pleasant, but states, "Backup, backup, and just put that down," (referring to the cup of water).  Pt took one small sip with max encouragement and cues, with no overt difficulty swallowing noted.  Pt is confused, talking to people who are not there, and with paranoid ideation, not wanting to accept po's and stating "Do you want me to commit suicide?"  RN notified.    HPI HPI: 69 year old female with a history of squamous cell carcinoma of the lung previously treated with chemotherapy which she completed 07/14/2011, CKD, hypertension presented from home secondary to increasing forgetfulness and mental status change for one week. Diagnosed UTI. Difficulty remembering her appointments. Tachycardia in the emergency department. CT of the chest revealed a new 4.1 x 2.7 soft tissue mass in the lower mediastinum. The patient's blood pressure has been soft throughout the hospitalization and he mid 90s. Slightly SOB. MRI- no sign of stroke. MBS completed on 11/22/2010 demonstrated functional swallow during MBS and recommended Dys lvl 3. Coughing noted throughout procedure but not due to  aspiration.    Pertinent Vitals Pain Assessment: No/denies pain  SLP Plan  Discharge SLP treatment due to (comment) (Defer cognitive eval and treat to SNF SLP)    Recommendations Diet recommendations: Regular;Thin liquid Liquids provided via: Cup;Straw Medication Administration: Other (Comment) (as tolerated) Supervision: Patient able to self feed;Staff to assist with self feeding Compensations: Slow rate;Small sips/bites;Check for pocketing Postural Changes and/or Swallow Maneuvers: Seated upright 90 degrees              Oral Care  Recommendations: Oral care BID Follow up Recommendations: Skilled Nursing facility Plan: Discharge SLP treatment due to (comment) (Defer cognitive eval and treat to SNF SLP)    GO     Quinn Axe T 08/22/2014, 11:13 AM

## 2014-08-22 NOTE — Progress Notes (Addendum)
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 Diagnosed UTI. Difficulty remembering her appointments. Tachycardia in the emergency department. CT of the chest revealed a new 4.1 x 2.7 soft tissue mass in the lower mediastinum. The patient's blood pressure has been soft throughout the hospitalization and he mid 90s. Slightly SOB. MRI- no sign of stroke. MBS completed on 11/22/2010 demonstrated functional swallow during MBS and recommended Dys lvl 3. Coughing noted throughout procedure but not due to aspiration.    PT Comments    Pt admitted with above diagnosis. Pt currently with functional limitations due to balance, endurance and strength deficits.  Pt continues to need skilled PT and will need NHP on d/c.  Pt will benefit from skilled PT to increase their independence and safety with mobility to allow discharge to the venue listed below.    Follow Up Recommendations  SNF;Supervision/Assistance - 24 hour     Equipment Recommendations  Other (comment) (TBA)    Recommendations for Other Services       Precautions / Restrictions Precautions Precautions: Fall Restrictions Weight Bearing Restrictions: No    Mobility  Bed Mobility Overal bed mobility: Needs Assistance;+2 for physical assistance Bed Mobility: Rolling;Sidelying to Sit Rolling: Mod assist;Max assist         General bed mobility comments: Upon arrival, pt lying in BM - very loose stool.  CAlled NT to bring linens and barrier cream as this PT and rehab tech rolled pt and cleaned pt.  Took incr time as bowel movment extremely loose and difficult to clean.  Barrier  cream applied and all linens changed.  Pt too tired to get to EOB after PT cleaned her.    Transfers                 General transfer comment: Pt unable to get to EOB after this PT had cleaned pt.   Ambulation/Gait                 Stairs            Wheelchair Mobility    Modified Rankin (Stroke Patients Only)       Balance                                    Cognition Arousal/Alertness: Awake/alert Behavior During Therapy: Anxious Overall Cognitive Status: History of cognitive impairments - at baseline       Memory: Decreased short-term memory              Exercises General Exercises - Lower Extremity Heel Slides: AROM;Both;5 reps;Supine    General Comments General comments (skin integrity, edema, etc.): Pt thinks she is at home.       Pertinent Vitals/Pain Pain Assessment: No/denies pain  VSS    Home Living                      Prior Function            PT Goals (current goals can now be found in the care plan section) Progress towards PT goals: Not progressing toward goals - comment (due  to loose stools)    Frequency  Min 2X/week    PT Plan Current plan remains appropriate    Co-evaluation             End of Session Equipment Utilized During Treatment: Gait belt Activity Tolerance: Patient limited by fatigue (limited by loose stools) Patient left: in bed;with call bell/phone within reach;with bed alarm set     Time: 1454-1526 PT Time Calculation (min) (ACUTE ONLY): 32 min  Charges:  $Therapeutic Activity: 8-22 mins $Self Care/Home Management: 8-22                    G CodesDenice Paradise September 14, 2014, 4:37 PM [facility] [phone] (pager)

## 2014-08-22 NOTE — Progress Notes (Signed)
CSW (Clinical Education officer, museum) left voicemail for pt son Madison [name] to and asked for return phone call with facility choice. Pt identified as potentially being ready for dc over the weekend. CSW will coordinate with facility once choice is made.  [place], [place]

## 2014-08-22 NOTE — Progress Notes (Addendum)
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 Patient had increased difficulty removing remaining appointments, history of loose stools, flank pain and weight loss. On admission she was found to be hyperkalemic with  UTIand sinus tachycardia in the ED. CT scan of the abdomen and pelvis showed necrotic lymph node suspicious for recurrence of her prior lung cancer. She was admitted for further management. Her hemoglobin dropped to 6.9 on the second day of admission. She was transfused with 2 units PRBC. She was also given IV fluids. Hospital course completed due to the remnant of pulmonary infiltrate, new-onset Afib with RVR, hypotension and new onset acute systolic CHF.      Assessment/Plan: Sepsis ( now resolved) Patient hypotensive and hypothermic  with rapid A. fib.   Sepsis could be related to UTI versus pneumonia ( ?Aspiration) associated with acute encephalopathy. - Blood cx negative.narrowed abx to levaquin.  New-onset acute systolic CHF EF of 05% with global hypokinesia. Per cardiology differential is broad secondary to  multivessel coronary disease versus diabetic cardiomyopathy versus takatsubo cardiomyopathy. Not a good candidate for cardiac catheterization or bypass surgery given her possible lung cancer recurrence , advanced chronic kidney disease, anemia and sepsis. -continue aspirin . Added low-dose Coreg . BP better and rate controlled. - Not on ACE inhibitor, Lasix or Aldactone due to borderline blood pressure and AKI.  ? New-onset A. fib with RVR -tolerating low dose coreg. Tele showing MAT. On baby aspirin. Her CHADS VASC score is 4 . Was on coumadin in  2012 for afib which was dced as she did not have any recurrence. Given anemia and recurrence of lung ca, cardiology feel she is not a candidate for long term anticoagulation.  Acute encephalopathy  possibly related to sepsis and metabolic acidosis. Slightly improved today. MRI brain negative for stroke or metastases. Monitor for now.  hypokalemia Replenishing with KCl.   Elevated troponin Possibly demand ischemia with underlying acute CHF and rapid A. Fib. Continue aspirin.  Hypotension Requiring periodic IV fluid bolus. BP improved. D/c fluids  Left lower lobe pneumonia with small pleural effusion Continue O2 via nasal cannula. Transitioned to  oral levaquin  History of non-small cell lung cancer status post chemotherapy (completed in 2012), with CT evidence of recurrence. CT chest shows large soft tissue mass in the lower middle mediastinum representing an enlarged lymph node . Not in acute distress. Hospitalist discussed with patient's oncologist Dr. Julien Nordmann and recommended outpatient  follow-up.  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 Non anion gap acidosis resolved.  Avoid nephrotoxins. D/c IV bicarb.   Anemia of  chronic disease Hemoglobin stable after 2 units PRBC transfused  Thrombocytopenia Likely secondary to sepsis vs HIT. Progressive drop in plt noted. Discontinued heparin products and place SCDs. Her 4Ts score is 3 ( low probability). HIT ab sent. Monitor plt closely.  Code Status:full code Family Communication: none at bedside Disposition Plan: continue tele monitoring. Possible d/c to SNF early next week if improved.   Consultants:  PC CM  Cardiology  Procedures:  2-D echo  MRI brain  Antibiotics:  None  HPI/Subjective: Patient seen and examined. HR elevated to 130s-140 at timers.  Tolerating low dose coreg.  Objective: Filed Vitals:   08/22/14 1300  BP: 94/79  Pulse:   Temp:   Resp:      Intake/Output Summary (Last 24 hours) at 08/22/14 1426 Last data filed at 08/22/14 0900  Gross per 24 hour  Intake     30 ml  Output      0 ml  Net     30 ml   Filed Weights   08/20/14 0500 08/21/14 0400 08/22/14 0358  Weight: 59.421 kg (131 lb) 52.164 kg (115 lb) 54.16 kg (119 lb 6.4 oz)    Exam:   General:  Elderly thin built female lying in bed, confused  HEENT: No pallor, moist oral mucosa  Chest: Diminished breath sounds over bilateral lung bases  CVS: S1 and S2  irregular, no murmurs, rubs or gallop  Abdomen: Soft, nondistended, nontender, bowel sounds present  Extremities: Warm, no edema, no rash or purpura  CNS: AAOX1 ( more alert and oriented today) Data Reviewed: Basic Metabolic Panel:  Recent Labs Lab 08/19/14 0600  08/20/14 0830 08/20/14 1605 08/20/14 2210 08/21/14 0500 08/22/14 0455  NA 137  < > 139 145 144 146* 142  K 5.3*  < > 2.8* 3.6 4.0 3.7 3.3*  CL 118*  < > 107 110 106 101 103  CO2 12*  < > 17* 22 25 27 28   GLUCOSE 120*  < > 135* 135* 132* 134* 97  BUN 36*  < > 34* 33* 32* 28* 30*  CREATININE 2.40*  < > 2.60* 2.71* 2.67* 2.57* 2.60*  CALCIUM 8.2*  < > 7.7* 8.1* 7.8* 7.9* 7.8*  MG 1.1*  --   --  2.2  --   --   --   < > = values in this interval not displayed. Liver Function Tests:  Recent Labs Lab 08/16/14 1607  AST 16  ALT 13  ALKPHOS 64  BILITOT 1.5*  PROT 6.8  ALBUMIN 2.8*   No results for input(s): LIPASE, AMYLASE in the last 168 hours.  Recent Labs Lab 08/17/14 1025  AMMONIA 13   CBC:  Recent Labs Lab 08/16/14 1607  08/18/14 0550 08/19/14 0600 08/20/14 0830 08/21/14 0500 08/22/14 0455  WBC 9.2  < > 13.5* 11.1* 10.2 8.8 10.2  NEUTROABS 8.2*  --   --   --   --   --   --   HGB 8.5*  < > 11.9* 11.5* 10.0* 9.9* 10.7*  HCT 25.9*  < > 34.3* 33.1* 27.8* 28.2* 30.3*  MCV 92.5  < > 84.3 86.4 83.5 83.9 83.9  PLT 212  < > PLATELET CLUMPS NOTED ON SMEAR, COUNT APPEARS DECREASED 99* 88* 82* 61*  < > = values in this  interval not displayed. Cardiac Enzymes:  Recent Labs Lab 08/16/14 1825 08/16/14 2143 08/17/14 0310 08/17/14 1025 08/18/14 0900  TROPONINI 0.19* 0.19* 0.23* 0.28* 0.16*   BNP (last 3 results) No results for input(s): PROBNP in the last 8760 hours. CBG:  Recent Labs Lab 08/19/14 2133  GLUCAP 133*    Recent Results (from the past 240 hour(s))  Urine culture     Status: None   Collection Time: 08/16/14  2:32 PM  Result Value Ref Range Status   Specimen Description URINE, CATHETERIZED  Final   Special Requests NONE  Final   Colony Count   Final    95,000 COLONIES/ML Performed at [facility]    Culture   Final    Multiple bacterial morphotypes present, none predominant. Suggest appropriate recollection  if clinically indicated. Performed at [facility]    Report Status 08/18/2014 FINAL  Final  Culture, blood (routine x 2)     Status: None (Preliminary result)   Collection Time: 08/19/14 10:58 AM  Result Value Ref Range Status   Specimen Description BLOOD LEFT WRIST  Final   Special Requests BOTTLES DRAWN AEROBIC ONLY 5CC  Final   Culture   Final           BLOOD CULTURE RECEIVED NO GROWTH TO DATE CULTURE WILL BE HELD FOR 5 DAYS BEFORE ISSUING A FINAL NEGATIVE REPORT Performed at [facility]    Report Status PENDING  Incomplete  Culture, blood (routine x 2)     Status: None (Preliminary result)   Collection Time: 08/19/14  3:20 PM  Result Value Ref Range Status   Specimen Description BLOOD RIGHT HAND  Final   Special Requests BOTTLES DRAWN AEROBIC ONLY 0.5CC  Final   Culture   Final           BLOOD CULTURE RECEIVED NO GROWTH TO DATE CULTURE WILL BE HELD FOR 5 DAYS BEFORE ISSUING A FINAL NEGATIVE REPORT Performed at [facility]    Report Status PENDING  Incomplete  MRSA PCR Screening     Status: None   Collection Time: 08/19/14  5:36 PM  Result Value Ref Range Status   MRSA by PCR NEGATIVE NEGATIVE Final    Comment:        The  GeneXpert MRSA Assay (FDA approved for NASAL specimens only), is one component of a comprehensive MRSA colonization surveillance program. It is not intended to diagnose MRSA infection nor to guide or monitor treatment for MRSA infections.      Studies: No results found.  Scheduled Meds: . antiseptic oral rinse  7 mL Mouth Rinse BID  . aspirin  81 mg Oral Daily  . carvedilol  3.125 mg Oral BID WC  . folic acid  1 mg Oral Daily  . [START ON 08/23/2014] levofloxacin  500 mg Oral Q48H  . sodium chloride  3 mL Intravenous Q12H   Continuous Infusions:      Time spent: 25 minutes    Madison [name], [place]  [facility] Pager [phone] If 7PM-7AM, please contact night-coverage at [email or web address], password [facility] 08/22/2014, 2:26 PM  LOS: 6 days

## 2014-08-22 NOTE — Progress Notes (Signed)
Subjective: Cheerful today  Objective: Vital signs in last 24 hours: Temp:  [97.7 F (36.5 C)-98.2 F (36.8 C)] 97.7 F (36.5 C) (01/15 0750) Pulse Rate:  [88-94] 94 (01/15 0358) Resp:  [19-23] 23 (01/15 0358) BP: (114-137)/(69-94) 124/94 mmHg (01/15 0358) SpO2:  [97 %-98 %] 98 % (01/15 0358) Weight:  [119 lb 6.4 oz (54.16 kg)] 119 lb 6.4 oz (54.16 kg) (01/15 0358) Weight change: 4 lb 6.4 oz (1.996 kg) Last BM Date: 08/22/14 Intake/Output from previous day: +15 01/14 0701 - 01/15 0700 In: 15 [P.O.:15] Out: -  Intake/Output this shift: Total I/O In: 15 [P.O.:15] Out: -   PE: General:Pleasant affect, NAD Skin:Warm and dry, brisk capillary refill HEENT:normocephalic, sclera clear, mucus membranes moist Heart:S1S2 RRR with soft  murmur, gallup, rub or click Lungs:clear without rales, rhonchi, or wheezes KDX:IPJA, non tender, + BS, do not palpate liver spleen or masses Ext:no lower ext edema, 2+ pedal pulses, 2+ radial pulses Neuro:alert and oriented, MAE, follows commands, + facial symmetry Tele:  HR up to 140 at times, some Stach or SVT other appears more irregularly irregular.   Lab Results:  Recent Labs  08/21/14 0500 08/22/14 0455  WBC 8.8 10.2  HGB 9.9* 10.7*  HCT 28.2* 30.3*  PLT 82* 61*   BMET  Recent Labs  08/21/14 0500 08/22/14 0455  NA 146* 142  K 3.7 3.3*  CL 101 103  CO2 27 28  GLUCOSE 134* 97  BUN 28* 30*  CREATININE 2.57* 2.60*  CALCIUM 7.9* 7.8*    No results found for: HGBA1C   Lab Results  Component Value Date   TSH 2.900 08/17/2014     Studies/Results: No results found.  Medications: I have reviewed the patient's current medications. Scheduled Meds: . antiseptic oral rinse  7 mL Mouth Rinse BID  . aspirin  81 mg Oral Daily  . carvedilol  3.125 mg Oral BID WC  . folic acid  1 mg Oral Daily  . [START ON 08/23/2014] levofloxacin  500 mg Oral Q48H  . potassium chloride  40 mEq Oral Once  . sodium chloride  3 mL  Intravenous Q12H   Continuous Infusions:  PRN Meds:.sodium chloride  Assessment/Plan: This is a 69 y.o. female with a past medical history significant for stage III lung cancer with possible recurrence, HTN, PAF, and CKD3, who presented to [facility] 08/16/14 with AMS. She was also found to have significant anemia requiring 2U transfusion, UTI and CT of the abdomen and pelvis did show a 3.5 x 3 cm necrotic lymph node. Troponin was checked and mildly elevated. EKG shows persistent anterolateral T-wave inversions which are new compared to her most recent office visit in 2015. Cardiology was asked to consult regarding elevated troponin and abnormal EKG.   1. 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 multivessel coronary disease vs dilated CM vs Takatsubo cardiomyopathy. Euvolemic on exam.I&O's and weights have not been accurate. No chest pain or dyspnea. As previously noted, troponin had a flat trend  less likely to be related to acute coronary syndrome and more likely related to heart failure, though volume currently stable. Given her multiple comorbidities including a history of lung cancer with possible recurrence, stage III or 4 chronic kidney disease, urinary tract infection and recent anemia she is not a good candidate for cardiac catheterization or bypass surgery if she would be found to have multivessel  coronary disease. Based on this, stress testing would be of little utility. Cont medical Rx  ASA and low dose Coreg 3.125mg  BID and titrate for tachycardia.   Add low dose aceI/ARB as BP tolerates  2. Elevated troponin- Conservative therapy. See #1.  3. Hypotension - Stable. Required fluid bolus on the 12th. Coreg held at times due to BP  4. Stage III lung cancer-she will need restaging of her lung cancer and if it is found to have recurrent lung cancer, given her cardiomyopathy, hospice care should be considered, CCM  has been following  5.? PAF vs. MAT- appears to be sinus, 1st deg avb, and frequent PAC's with rates in the 80's to low 100's and PVCs. Asymptomatic. She was apparently treated with warfarin in 2012 however had no recurrence of A. fib and anticoagulation was stopped in 2013. Given possible recurrence of lung cancer and the fact that she was profoundly anemic on admission without a clear source, I do not feel that she is a good candidate for long-term warfarin as well as the fact that I'm not sure that she has had any PAF -all of her arrhythmias this admission on tele appear to be MAT. Continue ASA. Only receiving coreg once a day if that.  At times she will not take.  6. Anemia- She was found to be significantly anemic and transfused 2 units of packed red blood cells.   7. CKD- baseline creat looks to be around 2. Creat trending downwards and now 2.6 in setting of relative hypotension recently.  8. Hyperkalemia/Hypokalemia- s/p kayexalate. now low will replace.  9. UTI- Abx per IM.  10. Hypothermia- Resolved. Currently 98.2.R  11. Hypernatremia at 146  Now down to 142   LOS: 6 days   Time spent with pt. :15 minutes. [facility] R  Nurse Practitioner Certified Pager [phone] or after 5pm and on weekends call [phone] 08/22/2014, 9:21 AM   I have personally seen and examined patient and agree with note as outlined by Cecilie Kicks, NP with minor changes.  Patient appears very confused this am and thinks she is at home.  Tele shows continue MAT with HR 80's to low 100's.  Continue coreg as BP tolerates.  Add low dose ACE I or ARB as BP tolerates for LV dysfunction.  Signed: Fransico Him, MD [facility] 08/22/2014

## 2014-08-23 DIAGNOSIS — R911 Solitary pulmonary nodule: Secondary | ICD-10-CM

## 2014-08-23 DIAGNOSIS — C3411 Malignant neoplasm of upper lobe, right bronchus or lung: Secondary | ICD-10-CM

## 2014-08-23 DIAGNOSIS — D696 Thrombocytopenia, unspecified: Secondary | ICD-10-CM

## 2014-08-23 DIAGNOSIS — I4891 Unspecified atrial fibrillation: Secondary | ICD-10-CM

## 2014-08-23 LAB — BASIC METABOLIC PANEL
ANION GAP: 13 (ref 5–15)
BUN: 34 mg/dL — ABNORMAL HIGH (ref 6–23)
CO2: 26 mmol/L (ref 19–32)
Calcium: 8 mg/dL — ABNORMAL LOW (ref 8.4–10.5)
Chloride: 103 mEq/L (ref 96–112)
Creatinine, Ser: 2.48 mg/dL — ABNORMAL HIGH (ref 0.50–1.10)
GFR, EST AFRICAN AMERICAN: 22 mL/min — AB (ref >90)
GFR, EST NON AFRICAN AMERICAN: 19 mL/min — AB (ref >90)
Glucose, Bld: 121 mg/dL — ABNORMAL HIGH (ref 70–99)
Potassium: 3.8 mmol/L (ref 3.5–5.1)
Sodium: 142 mmol/L (ref 135–145)

## 2014-08-23 LAB — CBC
HCT: 28.9 % — ABNORMAL LOW (ref 36.0–46.0)
HEMOGLOBIN: 10.1 g/dL — AB (ref 12.0–15.0)
MCH: 29.5 pg (ref 26.0–34.0)
MCHC: 34.9 g/dL (ref 30.0–36.0)
MCV: 84.5 fL (ref 78.0–100.0)
Platelets: 56 10*3/uL — ABNORMAL LOW (ref 150–400)
RBC: 3.42 MIL/uL — AB (ref 3.87–5.11)
RDW: 19 % — AB (ref 11.5–15.5)
WBC: 12.4 10*3/uL — ABNORMAL HIGH (ref 4.0–10.5)

## 2014-08-23 LAB — SAVE SMEAR

## 2014-08-23 MED ORDER — DILTIAZEM LOAD VIA INFUSION
10.0000 mg | Freq: Once | INTRAVENOUS | Status: AC
  Administered 2014-08-23: 10 mg via INTRAVENOUS
  Filled 2014-08-23: qty 10

## 2014-08-23 MED ORDER — DILTIAZEM HCL 100 MG IV SOLR
5.0000 mg/h | INTRAVENOUS | Status: DC
  Administered 2014-08-23: 5 mg/h via INTRAVENOUS
  Filled 2014-08-23: qty 100

## 2014-08-23 MED ORDER — CARVEDILOL 6.25 MG PO TABS
6.2500 mg | ORAL_TABLET | Freq: Two times a day (BID) | ORAL | Status: DC
  Administered 2014-08-23 – 2014-08-26 (×5): 6.25 mg via ORAL
  Filled 2014-08-23 (×7): qty 1

## 2014-08-23 MED ORDER — DILTIAZEM LOAD VIA INFUSION
10.0000 mg | Freq: Once | INTRAVENOUS | Status: DC
Start: 2014-08-23 — End: 2014-08-23
  Filled 2014-08-23: qty 10

## 2014-08-23 MED ORDER — ENSURE COMPLETE PO LIQD
237.0000 mL | Freq: Two times a day (BID) | ORAL | Status: DC
  Administered 2014-08-24 – 2014-08-28 (×3): 237 mL via ORAL

## 2014-08-23 MED ORDER — DIGOXIN 0.25 MG/ML IJ SOLN
0.2500 mg | Freq: Four times a day (QID) | INTRAMUSCULAR | Status: AC
  Administered 2014-08-23 (×2): 0.25 mg via INTRAVENOUS
  Filled 2014-08-23 (×3): qty 1

## 2014-08-23 NOTE — Progress Notes (Signed)
[place] CONSULT NOTE  Patient Care Team: Concha Norway, MD as PCP - General (Internal Medicine) Curt Bears, MD as Consulting Physician (Oncology)  CHIEF COMPLAINTS/PURPOSE OF CONSULTATION:  Thrombocytopenia and lung cancer  HISTORY OF PRESENTING ILLNESS:  Madison [name] 69 y.o. 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 Over the years she had done extremely well with complete response to treatment. Recently she was set up to see Dr. Julien Nordmann. She was scheduled to undergo some scans as an outpatient. She presented to the hospital with change in mental status and was found to have congestive heart failure and urinary tract infection. It was felt that her symptoms may be related to encephalopathy related to UTI. She was being treated with high-dose antibiotics. On admission showed a platelet count of 194 and it has slowly declined to 56. Her infection appears to be improving significantly. She currently has slight confusion but able to converse normally. She does have occasional hallucinations. Recently she has not received any chemotherapy. CT scan of the chest abdomen pelvis done in the hospital reveals what appears to be a 4.1 x 2.7 cm soft tissue lesion in the middle mediastinum in close contact with the distal third of the esophagus. It is unclear what this is from.  I reviewed her records extensively and collaborated the history with the patient.   MEDICAL HISTORY:  Past Medical History  Diagnosis Date  . Hypertension   . Anemia   . Atrial fibrillation   . Chronic kidney disease   . lung ca     lung ca  . Lung cancer 11/11/10    SURGICAL HISTORY: Past Surgical History  Procedure Laterality Date  . Port a cath placement      SOCIAL HISTORY: History   Social History  . Marital Status: Married    Spouse Name: N/A   Number of Children: N/A  . Years of Education: N/A   Occupational History  . Not on file.   Social History Main Topics  . Smoking status: Former Smoker -- 1.00 packs/day for 40 years    Types: Cigarettes    Quit date: 11/09/2010  . Smokeless tobacco: Never Used  . Alcohol Use: No  . Drug Use: No  . Sexual Activity: Not on file   Other Topics Concern  . Not on file   Social History Narrative    FAMILY HISTORY: Family History  Problem Relation Age of Onset  . Diabetes Mother   . Heart attack Father   . Hypertension Father   . Cancer Sister   . Stroke Brother   . Hypertension Brother   . Heart attack Brother   . Hypertension Brother     ALLERGIES:  is allergic to lisinopril.  MEDICATIONS:  Current Facility-Administered Medications  Medication Dose Route Frequency Provider Last Rate Last Dose  . antiseptic oral rinse (CPC / CETYLPYRIDINIUM CHLORIDE 0.05%) solution 7 mL  7 mL Mouth Rinse BID Etta Quill, DO   7 mL at 08/23/14 1028  . aspirin chewable tablet 81 mg  81 mg Oral Daily Pixie Casino, MD   81 mg at 08/23/14 1027  . carvedilol (COREG) tablet 6.25 mg  6.25 mg Oral BID WC Jacolyn Reedy, MD      . digoxin (LANOXIN) 0.25 MG/ML injection 0.25 mg  0.25 mg Intravenous Q6H Jacolyn Reedy, MD   0.25 mg  at 08/23/14 1306  . folic acid (FOLVITE) tablet 1 mg  1 mg Oral Daily Etta Quill, DO   1 mg at 08/23/14 1026  . levofloxacin (LEVAQUIN) tablet 500 mg  500 mg Oral Q48H [place]   500 mg at 08/23/14 1317  . sodium chloride 0.9 % injection 10-40 mL  10-40 mL Intracatheter PRN Etta Quill, DO   10 mL at 08/22/14 2032  . sodium chloride 0.9 % injection 3 mL  3 mL Intravenous Q12H Etta Quill, DO   3 mL at 08/23/14 1308    REVIEW OF SYSTEMS:   Constitutional: Denies fevers, chills or abnormal night sweats Eyes: Denies blurriness of vision, double vision or watery eyes Ears, nose, mouth, throat, and face: Denies mucositis or sore  throat Respiratory: Denies cough, dyspnea or wheezes Cardiovascular: Denies palpitation, chest discomfort or lower extremity swelling Gastrointestinal:  Denies nausea, heartburn or change in bowel habits Skin: Denies abnormal skin rashes Neurological:Denies numbness, tingling or new weaknesses Behavioral/Psych: Hallucinations  All other systems were reviewed with the patient and are negative.  PHYSICAL EXAMINATION: ECOG PERFORMANCE STATUS: 3 - Symptomatic, >50% confined to bed  Filed Vitals:   08/23/14 0757  BP:   Pulse:   Temp: 97.4 F (36.3 C)  Resp:    Filed Weights   08/20/14 0500 08/21/14 0400 08/22/14 0358  Weight: 131 lb (59.421 kg) 115 lb (52.164 kg) 119 lb 6.4 oz (54.16 kg)    GENERAL:alert, no distress and comfortable SKIN: skin color, texture, turgor are normal, no rashes or significant lesions EYES: normal, conjunctiva are pink and non-injected, sclera clear OROPHARYNX:no exudate, no erythema and lips, buccal mucosa, and tongue normal  LUNGS: clear to auscultation and percussion with normal breathing effort HEART: regular rate & rhythm and no murmurs and no lower extremity edema ABDOMEN:abdomen soft, non-tender and normal bowel sounds Musculoskeletal:no cyanosis of digits and no clubbing  PSYCH: Occasional hallucinations about having a baby in the room NEURO: no focal motor/sensory deficits  LABORATORY DATA:  I have reviewed the data as listed Lab Results  Component Value Date   WBC 12.4* 08/23/2014   HGB 10.1* 08/23/2014   HCT 28.9* 08/23/2014   MCV 84.5 08/23/2014   PLT 56* 08/23/2014   Lab Results  Component Value Date   NA 142 08/23/2014   K 3.8 08/23/2014   CL 103 08/23/2014   CO2 26 08/23/2014     ASSESSMENT AND PLAN:  1. Thrombocytopenia: I reviewed her peripheral smear and there does not appear to be any schistocytes. 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 On other medication induced thrombocytopenia. All  offending medications have not been discontinued. It could take several days with a platelet count to improve. Since she is not any emergent risk of bleeding, we can watch and monitor her counts. I suspect her counts will improve slowly over time.  2. Lung cancer with a new nodule in the mediastinum. She would most likely require a PET CT scan which can be arranged by Dr. Julien Nordmann as an outpatient.  He will follow as needed.     Rulon Eisenmenger, MD @T @ 1:32 PM

## 2014-08-23 NOTE — Progress Notes (Signed)
Subjective:  She is confused today and thinks she is in a trailer house.  No complaints of shortness of breath or chest pain.  Making comments about being paranoid about having a woman bathe her.  Evidently had rapid atrial fibrillation last night and started on intravenous diltiazem.  Objective:  Vital Signs in the last 24 hours: BP 135/86 mmHg  Pulse 88  Temp(Src) 97.4 F (36.3 C) (Axillary)  Resp 17  Ht 5\' 6"  (1.676 m)  Wt 54.16 kg (119 lb 6.4 oz)  BMI 19.28 kg/m2  SpO2 100%  Physical Exam: Thin elderly female in no acute distress Lungs:  Clear  Cardiac:  Regular rhythm, normal S1 and S2, no S3 Abdomen:  Soft, nontender, no masses Extremities:  No edema present  Intake/Output from previous day: 01/15 0701 - 01/16 0700 In: 25 [P.O.:15; I.V.:10] Out: -  Weight Filed Weights   08/20/14 0500 08/21/14 0400 08/22/14 0358  Weight: 59.421 kg (131 lb) 52.164 kg (115 lb) 54.16 kg (119 lb 6.4 oz)    Lab Results: Basic Metabolic Panel:  Recent Labs  08/22/14 0455 08/23/14 0500  NA 142 142  K 3.3* 3.8  CL 103 103  CO2 28 26  GLUCOSE 97 121*  BUN 30* 34*  CREATININE 2.60* 2.48*    CBC:  Recent Labs  08/22/14 0455 08/23/14 0500  WBC 10.2 12.4*  HGB 10.7* 10.1*  HCT 30.3* 28.9*  MCV 83.9 84.5  PLT 61* 56*    BNP No results found for: PROBNP  PROTIME: Lab Results  Component Value Date   INR 1.46 08/19/2014   INR 1.15 04/19/2012   INR 1.00* 01/27/2012   PROTIME 12.0 01/27/2012   PROTIME 19.2* 12/30/2011   PROTIME 30.0* 12/15/2011    Telemetry: Currently sinus rhythm.  Review of strips reveal rapid atrial fibrillation last night.  Assessment/Plan:   1.  Acute systolic congestive heart failure-not good candidate for intervention due to comorbidities. 2.  Rapid atrial fibrillation currently in sinus rhythm. 3.  Confusion and encephalopathy. 4.  Stage IV chronic kidney disease   Recommendations:  May try to titrate Coreg up.  Might consider use of  Lanoxin with LV dysfunction to help with rate control.      Kerry Hough  MD [facility]  08/23/2014, 8:41 AM

## 2014-08-23 NOTE — Clinical Social Work Note (Signed)
Patient's son, Rana Snare chooses bed at [facility]. CSW notified facility. Facility and pt's son prepared for anticipated discharge for tomorrow.   Placement note updated.   Glendon Axe, MSW, LCSWA [phone] 08/23/2014 11:13 AM

## 2014-08-23 NOTE — Progress Notes (Signed)
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 Patient had increased difficulty removing remaining appointments, history of loose stools, flank pain and weight loss. On admission she was found to be hyperkalemic with  UTIand sinus tachycardia in the ED. CT scan of the abdomen and pelvis showed necrotic lymph node suspicious for recurrence of her prior lung cancer. She was admitted for further management. Her hemoglobin dropped to 6.9 on the second day of admission. She was transfused with 2 units PRBC. She was also given IV fluids. Hospital course completed due to development of lung infiltrate, new-onset Afib with RVR, hypotension and new onset acute systolic CHF.      Assessment/Plan: Sepsis ( now resolved) Patient hypotensive and hypothermic  with rapid A. fib.   Sepsis could be related to UTI versus pneumonia ( ?Aspiration) associated with acute encephalopathy. - Blood cx negative.narrowed abx to levaquin.  New-onset acute systolic CHF EF of 63% with global hypokinesia. Per cardiology differential is broad secondary to  multivessel coronary disease versus diabetic cardiomyopathy versus takatsubo cardiomyopathy. Not a good candidate for cardiac catheterization or bypass surgery given her possible lung cancer recurrence , advanced chronic kidney disease, anemia and sepsis. -continue aspirin . Continue low-dose Coreg (dose increased as tolerated).  - Not on ACE inhibitor, Lasix or Aldactone due to borderline blood pressure and AKI.   New-onset A. fib with RVR - Tele showing MAT with rapid A. fib overnight.. On baby aspirin. Her CHADS VASC score is 4 . Was on coumadin in  2012 for afib which was dced as she did not have any recurrence. Given anemia and recurrence of lung ca, cardiology feel she is not a candidate for long term anticoagulation. -And T new Coreg. Added digoxin x 2 doses cardiology today.    Acute encephalopathy  possibly related to sepsis and metabolic acidosis. Right improvement noted for past 2 days. MRI brain negative for stroke or metastases. Monitor for now.  Thrombocytopenia Likely secondary to sepsis . Progressive drop in plt noted. Discontinued heparin products and place SCDs. She was on vancomycin for sepsis and has been discontinued. Her 4Ts score is 3 ( low probability for HIT). HIT ab sent. Check peripheral smear. Spoke with hematology Dr. Sonny Dandy who  thinks this is likely related to underlying illness. Monitor plt closely. -   hypokalemia Replenishing with KCl.     Elevated troponin Possibly demand ischemia with underlying acute CHF and rapid A. Fib. Continue aspirin.  Hypotension Requiring periodic IV fluid bolus. BP improved. D/c fluids  Left lower lobe pneumonia with small pleural effusion Continue O2 via nasal cannula. Transitioned to  oral levaquin  History of non-small cell lung cancer status post chemotherapy (completed in 2012), with CT evidence of recurrence. CT chest shows large soft tissue mass in the lower middle mediastinum representing an enlarged lymph node . Not in acute distress. Hospitalist discussed with patient's oncologist Dr. Julien Nordmann and recommended outpatient  follow-up.  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 Non anion gap acidosis resolved. Renal function slowly better. Avoid nephrotoxins. D/c IV bicarb.   Anemia of  chronic disease Hemoglobin stable after 2 units PRBC transfused  Code Status:full code Family Communication: Husband updated on  the phone Disposition Plan: continue tele monitoring. Possible d/c to SNF early next week if  improved.   Consultants:  [facility] CM  Cardiology  Procedures:  2-D echo  MRI brain  Antibiotics:  IV vancomycin and cefepime 1/8-1/12  Levaquin 1/12-1/16    HPI/Subjective: Patient seen and examined. Was again in rapid A. fib overnight and given IV Cardizem push. Ordered for 2 doses of digoxin by cardiology.   Objective: Filed Vitals:   08/23/14 0757  BP:   Pulse:   Temp: 97.4 F (36.3 C)  Resp:     Intake/Output Summary (Last 24 hours) at 08/23/14 1127 Last data filed at 08/23/14 0939  Gross per 24 hour  Intake     25 ml  Output      0 ml  Net     25 ml   Filed Weights   08/20/14 0500 08/21/14 0400 08/22/14 0358  Weight: 59.421 kg (131 lb) 52.164 kg (115 lb) 54.16 kg (119 lb 6.4 oz)    Exam:   General:  Elderly thin built female lying in bed, confused  HEENT: No pallor, moist oral mucosa  Chest: Clear breath sounds bilaterally  CVS: S1 and S2  irregular, no murmurs, rubs or gallop  Abdomen: Soft, nondistended, nontender, bowel sounds present  Extremities: Warm, no edema, no rash or purpura  CNS: AAOX1-2 Data Reviewed: Basic Metabolic Panel:  Recent Labs Lab 08/19/14 0600  08/20/14 1605 08/20/14 2210 08/21/14 0500 08/22/14 0455 08/23/14 0500  NA 137  < > 145 144 146* 142 142  K 5.3*  < > 3.6 4.0 3.7 3.3* 3.8  CL 118*  < > 110 106 101 103 103  CO2 12*  < > 22 25 27 28 26   GLUCOSE 120*  < > 135* 132* 134* 97 121*  BUN 36*  < > 33* 32* 28* 30* 34*  CREATININE 2.40*  < > 2.71* 2.67* 2.57* 2.60* 2.48*  CALCIUM 8.2*  < > 8.1* 7.8* 7.9* 7.8* 8.0*  MG 1.1*  --  2.2  --   --   --   --   < > = values in this interval not displayed. Liver Function Tests:  Recent Labs Lab 08/16/14 1607  AST 16  ALT 13  ALKPHOS 64  BILITOT 1.5*  PROT 6.8  ALBUMIN 2.8*   No results for input(s): LIPASE, AMYLASE in the last 168 hours.  Recent Labs Lab 08/17/14 1025  AMMONIA 13   CBC:  Recent Labs Lab 08/16/14 1607  08/19/14 0600 08/20/14 0830  08/21/14 0500 08/22/14 0455 08/23/14 0500  WBC 9.2  < > 11.1* 10.2 8.8 10.2 12.4*  NEUTROABS 8.2*  --   --   --   --   --   --   HGB 8.5*  < > 11.5* 10.0* 9.9* 10.7* 10.1*  HCT 25.9*  < > 33.1* 27.8* 28.2* 30.3* 28.9*  MCV 92.5  < > 86.4 83.5 83.9 83.9 84.5  PLT 212  < > 99* 88* 82* 61* 56*  < > = values in this interval not displayed. Cardiac Enzymes:  Recent Labs Lab 08/16/14 1825 08/16/14 2143 08/17/14 0310 08/17/14 1025 08/18/14 0900  TROPONINI 0.19* 0.19* 0.23* 0.28* 0.16*   BNP (last 3 results) No results for input(s): PROBNP in the last 8760 hours. CBG:  Recent Labs Lab 08/19/14 2133  GLUCAP 133*    Recent Results (from the past 240 hour(s))  Urine culture     Status: None   Collection Time: 08/16/14  2:32 PM  Result Value Ref Range Status   Specimen Description URINE, CATHETERIZED  Final   Special Requests NONE  Final   Colony Count   Final    95,000 COLONIES/ML Performed at [facility]    Culture   Final    Multiple bacterial morphotypes present, none predominant. Suggest appropriate recollection if clinically indicated. Performed at [facility]    Report Status 08/18/2014 FINAL  Final  Culture, blood (routine x 2)     Status: None (Preliminary result)   Collection Time: 08/19/14 10:58 AM  Result Value Ref Range Status   Specimen Description BLOOD LEFT WRIST  Final   Special Requests BOTTLES DRAWN AEROBIC ONLY 5CC  Final   Culture   Final           BLOOD CULTURE RECEIVED NO GROWTH TO DATE CULTURE WILL BE HELD FOR 5 DAYS BEFORE ISSUING A FINAL NEGATIVE REPORT Performed at [facility]    Report Status PENDING  Incomplete  Culture, blood (routine x 2)     Status: None (Preliminary result)   Collection Time: 08/19/14  3:20 PM  Result Value Ref Range Status   Specimen Description BLOOD RIGHT HAND  Final   Special Requests BOTTLES DRAWN AEROBIC ONLY 0.5CC  Final   Culture   Final           BLOOD CULTURE RECEIVED NO GROWTH TO  DATE CULTURE WILL BE HELD FOR 5 DAYS BEFORE ISSUING A FINAL NEGATIVE REPORT Performed at [facility]    Report Status PENDING  Incomplete  MRSA PCR Screening     Status: None   Collection Time: 08/19/14  5:36 PM  Result Value Ref Range Status   MRSA by PCR NEGATIVE NEGATIVE Final    Comment:        The GeneXpert MRSA Assay (FDA approved for NASAL specimens only), is one component of a comprehensive MRSA colonization surveillance program. It is not intended to diagnose MRSA infection nor to guide or monitor treatment for MRSA infections.   Clostridium Difficile by PCR     Status: None   Collection Time: 08/22/14  3:11 PM  Result Value Ref Range Status   C difficile by pcr NEGATIVE NEGATIVE Final     Studies: No results found.  Scheduled Meds: . antiseptic oral rinse  7 mL Mouth Rinse BID  . aspirin  81 mg Oral Daily  . carvedilol  6.25 mg Oral BID WC  . digoxin  0.25 mg Intravenous P3I  . folic acid  1 mg Oral Daily  . levofloxacin  500 mg Oral Q48H  . sodium chloride  3 mL Intravenous Q12H   Continuous Infusions:      Time spent: 25 minutes    Cashlyn Huguley, [place]  [facility] Pager [phone] If 7PM-7AM, please contact night-coverage at [email or web address], password [facility] 08/23/2014, 11:27 AM  LOS: 7 days

## 2014-08-24 LAB — BASIC METABOLIC PANEL
ANION GAP: 12 (ref 5–15)
BUN: 35 mg/dL — ABNORMAL HIGH (ref 6–23)
CHLORIDE: 103 meq/L (ref 96–112)
CO2: 25 mmol/L (ref 19–32)
Calcium: 8.3 mg/dL — ABNORMAL LOW (ref 8.4–10.5)
Creatinine, Ser: 2.37 mg/dL — ABNORMAL HIGH (ref 0.50–1.10)
GFR calc Af Amer: 23 mL/min — ABNORMAL LOW (ref >90)
GFR calc non Af Amer: 20 mL/min — ABNORMAL LOW (ref >90)
Glucose, Bld: 91 mg/dL (ref 70–99)
POTASSIUM: 3.6 mmol/L (ref 3.5–5.1)
SODIUM: 140 mmol/L (ref 135–145)

## 2014-08-24 LAB — CBC
HCT: 30.5 % — ABNORMAL LOW (ref 36.0–46.0)
HEMOGLOBIN: 10.5 g/dL — AB (ref 12.0–15.0)
MCH: 29.9 pg (ref 26.0–34.0)
MCHC: 34.4 g/dL (ref 30.0–36.0)
MCV: 86.9 fL (ref 78.0–100.0)
Platelets: 64 10*3/uL — ABNORMAL LOW (ref 150–400)
RBC: 3.51 MIL/uL — ABNORMAL LOW (ref 3.87–5.11)
RDW: 19.2 % — ABNORMAL HIGH (ref 11.5–15.5)
WBC: 13.3 10*3/uL — AB (ref 4.0–10.5)

## 2014-08-24 MED ORDER — THIAMINE HCL 100 MG/ML IJ SOLN
100.0000 mg | Freq: Every day | INTRAMUSCULAR | Status: DC
  Administered 2014-08-24 – 2014-08-25 (×2): 100 mg via INTRAVENOUS
  Filled 2014-08-24 (×2): qty 2

## 2014-08-24 MED ORDER — DIGOXIN 125 MCG PO TABS
0.1250 mg | ORAL_TABLET | Freq: Every day | ORAL | Status: DC
Start: 2014-08-24 — End: 2014-08-25
  Administered 2014-08-24 – 2014-08-25 (×2): 0.125 mg via ORAL
  Filled 2014-08-24 (×2): qty 1

## 2014-08-24 MED ORDER — QUETIAPINE FUMARATE 25 MG PO TABS
25.0000 mg | ORAL_TABLET | Freq: Every day | ORAL | Status: DC
  Administered 2014-08-24 – 2014-08-25 (×2): 25 mg via ORAL
  Filled 2014-08-24 (×2): qty 1

## 2014-08-24 NOTE — Progress Notes (Signed)
Subjective:  Confused and hallucinating today.  Complains of chest wall soreness.  No definite shortness of breath.  Objective:  Vital Signs in the last 24 hours: BP 125/72 mmHg  Pulse 85  Temp(Src) 97.7 F (36.5 C) (Axillary)  Resp 20  Ht 5\' 6"  (1.676 m)  Wt 54.16 kg (119 lb 6.4 oz)  BMI 19.28 kg/m2  SpO2 100%  Physical Exam: Thin elderly female in no acute distress, confused Lungs:  Clear  Cardiac:  Irregular rhythm, normal S1 and S2, no S3 Extremities:  No edema present  Intake/Output from previous day: 01/16 0701 - 01/17 0700 In: 65 [P.O.:65] Out: -  Weight Filed Weights   08/20/14 0500 08/21/14 0400 08/22/14 0358  Weight: 59.421 kg (131 lb) 52.164 kg (115 lb) 54.16 kg (119 lb 6.4 oz)    Lab Results: Basic Metabolic Panel:  Recent Labs  08/23/14 0500 08/24/14 0415  NA 142 140  K 3.8 3.6  CL 103 103  CO2 26 25  GLUCOSE 121* 91  BUN 34* 35*  CREATININE 2.48* 2.37*    CBC:  Recent Labs  08/23/14 0500 08/24/14 0415  WBC 12.4* 13.3*  HGB 10.1* 10.5*  HCT 28.9* 30.5*  MCV 84.5 86.9  PLT 56* 64*    BNP No results found for: PROBNP  PROTIME: Lab Results  Component Value Date   INR 1.46 08/19/2014   INR 1.15 04/19/2012   INR 1.00* 01/27/2012   PROTIME 12.0 01/27/2012   PROTIME 19.2* 12/30/2011   PROTIME 30.0* 12/15/2011    Telemetry: Currently sinus rhythm.  Review of strips reveal rapid atrial fibrillation last night.  Assessment/Plan:   1.  Acute systolic congestive heart failure-not good candidate for intervention due to comorbidities. 2.  Recurrent atrial fibrillation somewhat better rate control now on digoxin 3.  Confusion and encephalopathy. 4.  Stage IV chronic kidney disease   Recommendations:  I would place on a low dose of Lanoxin to help with rate control.  She received 2 doses yesterday.      Kerry Hough  MD [facility]  08/24/2014, 11:29 AM

## 2014-08-24 NOTE — Progress Notes (Addendum)
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 Patient had increased difficulty removing remaining appointments, history of loose stools, flank pain and weight loss. On admission she was found to be hyperkalemic with  UTIand sinus tachycardia in the ED. CT scan of the abdomen and pelvis showed necrotic lymph node suspicious for recurrence of her prior lung cancer. She was admitted for further management. Her hemoglobin dropped to 6.9 on the second day of admission. She was transfused with 2 units PRBC. She was also given IV fluids. Hospital course completed due to development of lung infiltrate, new-onset Afib with RVR, hypotension and new onset acute systolic CHF.      Assessment/Plan: Sepsis ( now resolved) Patient was  hypotensive and hypothermic  with rapid A. fib.   Sepsis could be related to UTI versus pneumonia ( ?Aspiration) associated with acute encephalopathy. - Blood cx negative.narrowed abx to levaquin and dced today ( received >7 days of abx)  New-onset acute systolic CHF EF of 51% with global hypokinesia. Per cardiology differential is broad secondary to  multivessel coronary disease versus diabetic cardiomyopathy versus takatsubo cardiomyopathy. Not a good candidate for cardiac catheterization or bypass surgery given her possible lung cancer recurrence , advanced chronic kidney disease, anemia and sepsis. -hold ASA given thrombocytopenia. . Continue bid Coreg.  - Not on ACE inhibitor, Lasix or Aldactone due to borderline blood pressure and AKI.   New-onset A. fib with RVR -  . Her CHADS VASC score is 4 . Was on coumadin in 2012 for afib which was dced as  she did not have any recurrence. Given anemia and recurrence of lung ca, cardiology feel she is not a candidate for long term anticoagulation. continue Coreg. Placed on low dose digoxin.  -was placed on baby ASA but i have held it given thrombocytopenia.  Acute encephalopathy Thought to be related to metabolic acidosis and sepsis. However remains persistent. TSH, B12 and ammonia level normal. Check RPR and HIV. Check thiamine level. Will place on IV thiamine for 3 days. Patient reportedly hallucinating and agitated at times. MRI brain negative for stroke or metastases.  -Added low-dose Seroquel daily.  Thrombocytopenia Likely secondary to sepsis and medications . Progressive drop in plt noted. Discontinued heparin products and place SCDs. She was on vancomycin for sepsis and has been discontinued. Her 4Ts score is 3 ( low probability for HIT). HIT ab sent.  Seen by hematology and recommended to monitor for now. -   hypokalemia Replenishing with KCl.     Elevated troponin Possibly demand ischemia with underlying acute CHF and rapid A. Fib. Continue aspirin.  Hypotension Required periodic IV fluid bolus. BP improved.  Left lower lobe pneumonia with small pleural effusion Continue O2 via nasal cannula. Transitioned to  oral levaquin  History of non-small cell lung cancer status post chemotherapy (completed in 2012), with CT evidence of recurrence. CT chest shows large soft tissue mass in the lower middle mediastinum representing an enlarged lymph node . Not in acute distress. Hospitalist discussed with patient's oncologist Dr. Julien Nordmann and recommended outpatient  follow-up.  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 Non anion gap acidosis resolved. Renal function slowly better. Avoid nephrotoxins.   Anemia  of  chronic disease Hemoglobin stable after 2 units PRBC transfused  Code Status:full code Family Communication: Husband updated on the phone  on 1/16 Disposition Plan: continue tele monitoring. Possible d/c to SNF tomorrow   Consultants:  [facility] CM  Cardiology  Procedures:  2-D echo  MRI brain  Antibiotics:  IV vancomycin and cefepime 1/8-1/12  Levaquin 1/12-1/16    HPI/Subjective: Patient seen and examined. Heart rate better controlled today. Persistently confused.     Objective: Filed Vitals:   08/24/14 0756  BP:   Pulse: 85  Temp:   Resp:     Intake/Output Summary (Last 24 hours) at 08/24/14 1237 Last data filed at 08/23/14 1900  Gross per 24 hour  Intake     50 ml  Output      0 ml  Net     50 ml   Filed Weights   08/20/14 0500 08/21/14 0400 08/22/14 0358  Weight: 59.421 kg (131 lb) 52.164 kg (115 lb) 54.16 kg (119 lb 6.4 oz)    Exam:   General:  Elderly thin built female lying in bed, confused  HEENT: No pallor, moist oral mucosa  Chest: Clear breath sounds bilaterally  CVS: S1 and S2  irregular, no murmurs, rubs or gallop  Abdomen: Soft, nondistended, nontender, bowel sounds present  Extremities: Warm, no edema, no rash or purpura  CNS: AAOX0, post Data Reviewed: Basic Metabolic Panel:  Recent Labs Lab 08/19/14 0600  08/20/14 1605 08/20/14 2210 08/21/14 0500 08/22/14 0455 08/23/14 0500 08/24/14 0415  NA 137  < > 145 144 146* 142 142 140  K 5.3*  < > 3.6 4.0 3.7 3.3* 3.8 3.6  CL 118*  < > 110 106 101 103 103 103  CO2 12*  < > 22 25 27 28 26 25   GLUCOSE 120*  < > 135* 132* 134* 97 121* 91  BUN 36*  < > 33* 32* 28* 30* 34* 35*  CREATININE 2.40*  < > 2.71* 2.67* 2.57* 2.60* 2.48* 2.37*  CALCIUM 8.2*  < > 8.1* 7.8* 7.9* 7.8* 8.0* 8.3*  MG 1.1*  --  2.2  --   --   --   --   --   < > = values in this interval not displayed. Liver Function Tests: No results for input(s): AST, ALT, ALKPHOS, BILITOT, PROT, ALBUMIN in the last 168 hours. No results for input(s): LIPASE, AMYLASE in the last 168 hours. No results for input(s): AMMONIA in the last 168 hours. CBC:  Recent  Labs Lab 08/20/14 0830 08/21/14 0500 08/22/14 0455 08/23/14 0500 08/24/14 0415  WBC 10.2 8.8 10.2 12.4* 13.3*  HGB 10.0* 9.9* 10.7* 10.1* 10.5*  HCT 27.8* 28.2* 30.3* 28.9* 30.5*  MCV 83.5 83.9 83.9 84.5 86.9  PLT 88* 82* 61* 56* 64*   Cardiac Enzymes:  Recent Labs Lab 08/18/14 0900  TROPONINI 0.16*   BNP (last 3 results) No results for input(s): PROBNP in the last 8760 hours. CBG:  Recent Labs Lab 08/19/14 2133  GLUCAP 133*    Recent Results (from the past 240 hour(s))  Urine culture     Status: None   Collection Time: 08/16/14  2:32 PM  Result Value Ref Range Status   Specimen Description URINE, CATHETERIZED  Final   Special Requests NONE  Final   Colony Count   Final    95,000 COLONIES/ML Performed at [facility]    Culture   Final    Multiple bacterial morphotypes present, none predominant. Suggest  appropriate recollection if clinically indicated. Performed at [facility]    Report Status 08/18/2014 FINAL  Final  Culture, blood (routine x 2)     Status: None (Preliminary result)   Collection Time: 08/19/14 10:58 AM  Result Value Ref Range Status   Specimen Description BLOOD LEFT WRIST  Final   Special Requests BOTTLES DRAWN AEROBIC ONLY 5CC  Final   Culture   Final           BLOOD CULTURE RECEIVED NO GROWTH TO DATE CULTURE WILL BE HELD FOR 5 DAYS BEFORE ISSUING A FINAL NEGATIVE REPORT Performed at [facility]    Report Status PENDING  Incomplete  Culture, blood (routine x 2)     Status: None (Preliminary result)   Collection Time: 08/19/14  3:20 PM  Result Value Ref Range Status   Specimen Description BLOOD RIGHT HAND  Final   Special Requests BOTTLES DRAWN AEROBIC ONLY 0.5CC  Final   Culture   Final           BLOOD CULTURE RECEIVED NO GROWTH TO DATE CULTURE WILL BE HELD FOR 5 DAYS BEFORE ISSUING A FINAL NEGATIVE REPORT Performed at [facility]    Report Status PENDING  Incomplete  MRSA PCR Screening     Status:  None   Collection Time: 08/19/14  5:36 PM  Result Value Ref Range Status   MRSA by PCR NEGATIVE NEGATIVE Final    Comment:        The GeneXpert MRSA Assay (FDA approved for NASAL specimens only), is one component of a comprehensive MRSA colonization surveillance program. It is not intended to diagnose MRSA infection nor to guide or monitor treatment for MRSA infections.   Clostridium Difficile by PCR     Status: None   Collection Time: 08/22/14  3:11 PM  Result Value Ref Range Status   C difficile by pcr NEGATIVE NEGATIVE Final     Studies: No results found.  Scheduled Meds: . antiseptic oral rinse  7 mL Mouth Rinse BID  . aspirin  81 mg Oral Daily  . carvedilol  6.25 mg Oral BID WC  . digoxin  0.125 mg Oral Daily  . feeding supplement (ENSURE COMPLETE)  237 mL Oral BID BM  . folic acid  1 mg Oral Daily  . QUEtiapine  25 mg Oral Daily  . sodium chloride  3 mL Intravenous Q12H   Continuous Infusions:      Time spent: 25 minutes    Kimani Bedoya, [place]  [facility] Pager [phone] If 7PM-7AM, please contact night-coverage at [email or web address], password [facility] 08/24/2014, 12:37 PM  LOS: 8 days

## 2014-08-24 NOTE — Progress Notes (Signed)
SUBJECTIVE: Mild hallucinations  OBJECTIVE PHYSICAL EXAMINATION: ECOG PERFORMANCE STATUS: 3 - Symptomatic, >50% confined to bed  Filed Vitals:   08/24/14 0756  BP:   Pulse: 85  Temp:   Resp:    Filed Weights   08/20/14 0500 08/21/14 0400 08/22/14 0358  Weight: 131 lb (59.421 kg) 115 lb (52.164 kg) 119 lb 6.4 oz (54.16 kg)    GENERAL:alert, no distress and comfortable SKIN: skin color, texture, turgor are normal, no rashes or significant lesions EYES: normal, Conjunctiva are pink and non-injected, sclera clear LUNGS: clear to auscultation and percussion with normal breathing effort HEART: regular rate & rhythm and no murmurs and no lower extremity edema ABDOMEN:abdomen soft, non-tender and normal bowel sounds Musculoskeletal:no cyanosis of digits and no clubbing  NEURO: alert & oriented x 3 with fluent speech, no focal motor/sensory deficits  LABORATORY DATA:  I have reviewed the data as listed CMP Latest Ref Rng 08/24/2014 08/23/2014 08/22/2014  Glucose 70 - 99 mg/dL 91 121(H) 97  BUN 6 - 23 mg/dL 35(H) 34(H) 30(H)  Creatinine 0.50 - 1.10 mg/dL 2.37(H) 2.48(H) 2.60(H)  Sodium 135 - 145 mmol/L 140 142 142  Potassium 3.5 - 5.1 mmol/L 3.6 3.8 3.3(L)  Chloride 96 - 112 mEq/L 103 103 103  CO2 19 - 32 mmol/L 25 26 28   Calcium 8.4 - 10.5 mg/dL 8.3(L) 8.0(L) 7.8(L)  Total Protein 6.0 - 8.3 g/dL - - -  Total Bilirubin 0.3 - 1.2 mg/dL - - -  Alkaline Phos 39 - 117 U/L - - -  AST 0 - 37 U/L - - -  ALT 0 - 35 U/L - - -      Lab Results  Component Value Date   WBC 13.3* 08/24/2014   HGB 10.5* 08/24/2014   HCT 30.5* 08/24/2014   MCV 86.9 08/24/2014   PLT 64* 08/24/2014   NEUTROABS 8.2* 08/16/2014    ASSESSMENT AND PLAN:  1. Thrombocytopenia: Suspicious for medication-related thrombocytopenia. Platelet count has improved from yesterday. Symptoms 64 today. No further recommendations from hematology standpoint. We are still awaiting the results of the HIT antibody test. 2. H/O  lung cancer with a new nodule in the mediastinum: Dr.Mohamed will follow her as an outpatient and he will order a CT scan for further evaluation. Dr. Julien Nordmann to follow from tomorrow.

## 2014-08-25 DIAGNOSIS — N183 Chronic kidney disease, stage 3 unspecified: Secondary | ICD-10-CM | POA: Insufficient documentation

## 2014-08-25 DIAGNOSIS — I480 Paroxysmal atrial fibrillation: Secondary | ICD-10-CM | POA: Insufficient documentation

## 2014-08-25 DIAGNOSIS — N179 Acute kidney failure, unspecified: Secondary | ICD-10-CM

## 2014-08-25 LAB — CBC
HEMATOCRIT: 28.6 % — AB (ref 36.0–46.0)
HEMOGLOBIN: 9.7 g/dL — AB (ref 12.0–15.0)
MCH: 29 pg (ref 26.0–34.0)
MCHC: 33.9 g/dL (ref 30.0–36.0)
MCV: 85.4 fL (ref 78.0–100.0)
PLATELETS: 62 10*3/uL — AB (ref 150–400)
RBC: 3.35 MIL/uL — AB (ref 3.87–5.11)
RDW: 19.5 % — ABNORMAL HIGH (ref 11.5–15.5)
WBC: 13.3 10*3/uL — ABNORMAL HIGH (ref 4.0–10.5)

## 2014-08-25 LAB — BASIC METABOLIC PANEL
Anion gap: 5 (ref 5–15)
BUN: 39 mg/dL — ABNORMAL HIGH (ref 6–23)
CHLORIDE: 106 meq/L (ref 96–112)
CO2: 28 mmol/L (ref 19–32)
Calcium: 8.8 mg/dL (ref 8.4–10.5)
Creatinine, Ser: 2.72 mg/dL — ABNORMAL HIGH (ref 0.50–1.10)
GFR calc Af Amer: 19 mL/min — ABNORMAL LOW (ref >90)
GFR calc non Af Amer: 17 mL/min — ABNORMAL LOW (ref >90)
Glucose, Bld: 96 mg/dL (ref 70–99)
Potassium: 3.7 mmol/L (ref 3.5–5.1)
Sodium: 139 mmol/L (ref 135–145)

## 2014-08-25 LAB — HEPARIN INDUCED THROMBOCYTOPENIA PNL
HEPARIN INDUCED PLT AB: NEGATIVE
Patient O.D.: 0.055
UFH HIGH DOSE UFH H: 0 %
UFH LOW DOSE 0.5 IU/ML: 0 %
UFH Low Dose 0.1 IU/mL: 0 % Release
UFH SRA Result: NEGATIVE

## 2014-08-25 LAB — CULTURE, BLOOD (ROUTINE X 2)
Culture: NO GROWTH
Culture: NO GROWTH

## 2014-08-25 MED ORDER — QUETIAPINE FUMARATE 25 MG PO TABS
25.0000 mg | ORAL_TABLET | Freq: Two times a day (BID) | ORAL | Status: DC
  Administered 2014-08-26 – 2014-08-27 (×3): 25 mg via ORAL
  Filled 2014-08-25 (×5): qty 1

## 2014-08-25 MED ORDER — BOOST / RESOURCE BREEZE PO LIQD
1.0000 | Freq: Three times a day (TID) | ORAL | Status: DC
  Administered 2014-08-25 – 2014-08-28 (×5): 1 via ORAL

## 2014-08-25 NOTE — Progress Notes (Signed)
CSW (Clinical Education officer, museum) notified facility of plan for potential discharge tomorrow.  [place], [place]

## 2014-08-25 NOTE — Progress Notes (Addendum)
Pt taking medications crushed in applesauce, pocketing medications, refusing to swallow, spit part of medication out, tolerated breakfast this am with no difficulty swallowing noted, will continue to monitor closely

## 2014-08-25 NOTE — Consult Note (Signed)
Patient Profile: 69 y.o. female with a past medical history significant for stage III lung cancer with possible recurrence, hypertension, atrial fibrillation and CKD3, who presented with altered mental status. Found to have elevated troponin levels and acute systolic CHF/ new cardiomyopathy with EF of 20-25% (EF was 55% in 2013).   Subjective: States she feels "fair". No acute distress. Currently CP free. No dyspnea.   Objective: Vital signs in last 24 hours: Temp:  [97.4 F (36.3 C)-98.4 F (36.9 C)] 97.8 F (36.6 C) (01/18 0400) Pulse Rate:  [77-87] 84 (01/18 0400) Resp:  [15-22] 16 (01/18 0400) BP: (107-126)/(59-82) 119/75 mmHg (01/18 0400) SpO2:  [96 %-99 %] 96 % (01/18 0400) Last BM Date: 08/23/14  Intake/Output from previous day: 01/17 0701 - 01/18 0700 In: 180 [P.O.:180] Out: -  Intake/Output this shift: Total I/O In: 40 [P.O.:40] Out: -   Medications Current Facility-Administered Medications  Medication Dose Route Frequency Provider Last Rate Last Dose  . antiseptic oral rinse (CPC / CETYLPYRIDINIUM CHLORIDE 0.05%) solution 7 mL  7 mL Mouth Rinse BID Etta Quill, DO   7 mL at 08/24/14 1024  . aspirin chewable tablet 81 mg  81 mg Oral Daily Pixie Casino, MD   81 mg at 08/24/14 1022  . carvedilol (COREG) tablet 6.25 mg  6.25 mg Oral BID WC Jacolyn Reedy, MD   6.25 mg at 08/24/14 0756  . digoxin (LANOXIN) tablet 0.125 mg  0.125 mg Oral Daily Jacolyn Reedy, MD   0.125 mg at 08/24/14 1247  . feeding supplement (ENSURE COMPLETE) (ENSURE COMPLETE) liquid 237 mL  237 mL Oral BID BM Nishant Dhungel, MD   237 mL at 08/24/14 1022  . folic acid (FOLVITE) tablet 1 mg  1 mg Oral Daily Etta Quill, DO   1 mg at 08/24/14 1022  . QUEtiapine (SEROQUEL) tablet 25 mg  25 mg Oral Daily Nishant Dhungel, MD   25 mg at 08/24/14 1251  . sodium chloride 0.9 % injection 10-40 mL  10-40 mL Intracatheter PRN Etta Quill, DO   10 mL at 08/24/14 0453  . sodium chloride 0.9 %  injection 3 mL  3 mL Intravenous Q12H Etta Quill, DO   3 mL at 08/24/14 1023  . thiamine (B-1) injection 100 mg  100 mg Intravenous Daily Nishant Dhungel, MD   100 mg at 08/24/14 1338    PE: General appearance: alert, cooperative and no distress Neck: no JVD Lungs: clear to auscultation bilaterally Heart: irregularly irregular rhythm Extremities: no LEE Pulses: 2+ and symmetric Skin: warm and dry Neurologic: Grossly normal  Lab Results:   Recent Labs  08/23/14 0500 08/24/14 0415 08/25/14 0522  WBC 12.4* 13.3* 13.3*  HGB 10.1* 10.5* 9.7*  HCT 28.9* 30.5* 28.6*  PLT 56* 64* 62*   BMET  Recent Labs  08/23/14 0500 08/24/14 0415 08/25/14 0522  NA 142 140 139  K 3.8 3.6 3.7  CL 103 103 106  CO2 26 25 28   GLUCOSE 121* 91 96  BUN 34* 35* 39*  CREATININE 2.48* 2.37* 2.72*  CALCIUM 8.0* 8.3* 8.8   Cardiac Panel (last 3 results) No results for input(s): CKTOTAL, CKMB, TROPONINI, RELINDX in the last 72 hours.  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 The cavity size was severely dilated. Wall thickness was normal. Systolic function was severely reduced. The estimated ejection fraction was  in the range of 20% to 25%. - Aortic valve: Mildly dilated non coronary sinus of valsalva. - Atrial septum: No defect or patent foramen ovale was identified. - Pulmonary arteries: PA peak pressure: 42 mm Hg (S).  Assessment/Plan  Principal Problem:   Lung cancer Active Problems:   Anemia   CKD (chronic kidney disease) stage 3, GFR 30-59 ml/min   PAF (paroxysmal atrial fibrillation)   Metabolic acidosis   UTI (lower urinary tract infection)   Hyperkalemia   Altered mental status   Acute encephalopathy   Tachycardia   Elevated troponin   Abnormal EKG   Acute systolic congestive heart failure, NYHA class 4   Protein-calorie  malnutrition, severe   Blood poisoning   Hypokalemia   Sepsis   Atrial fibrillation with RVR   Thrombocytopenia   Multifocal atrial tachycardia  1. 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 No dyspnea. Given her multiple comorbidities including a history of lung cancer with possible recurrence, stage III or 4 chronic kidney disease, urinary tract infection and recent anemia she is not a good candidate for cardiac catheterization or bypass surgery if she would be found to have multivessel coronary disease. Based on this, stress testing felt to be of little utility. Plan is to continue optimizing her heart failure medications and treating this medically. Continue with carvedilol and digoxin, as well as ASA. No ACE/ARB given CKD.   2. Atrial Fibrillation: digoxin added yesterday for additional rate control. Resting rate is in the 70s. Continue BB and digoxin.     LOS: 9 days    Brittainy M. Ladoris Gene 08/25/2014 9:13 AM  History and all data above reviewed.  Patient examined.  I agree with the findings as above. She is answering questions by shaking her head.  She denies chest pain or SOB The patient exam reveals COR:  Irregular  ,  Lungs: Decreased breath sounds  ,  Abd: Positive bowel sounds, no rebound no guarding, Ext No edema  .  All available labs, radiology testing, previous records reviewed. Agree with documented assessment and plan. She was started on low dose dig yesterday.  However, with her rising creatinine I will stop this.    Jeneen Rinks Ayman Brull  10:18 AM  08/25/2014

## 2014-08-25 NOTE — Progress Notes (Addendum)
[facility] PROGRESS NOTE  Madison [name] WPY:[id number] DOB: Apr 14, 1946 DOA: 08/16/2014 PCP: Concha Norway, MD   Brief narrative 69 year old female with history of squamous cell carcinoma of the lung previously treated with chemotherapy, completed on 07/14/2011, [facility], hypertension presented from home due to worsening confusion and mental status changes for one week. Patient had increased difficulty removing remaining appointments, history of loose stools, flank pain and weight loss. On admission she was found to be hyperkalemic with  UTIand sinus tachycardia in the ED. CT scan of the abdomen and pelvis showed necrotic lymph node suspicious for recurrence of her prior lung cancer. She was admitted for further management. Her hemoglobin dropped to 6.9 on the second day of admission. She was transfused with 2 units PRBC. She was also given IV fluids. Hospital course completed due to development of lung infiltrate, new-onset Afib with RVR, hypotension and new onset acute systolic CHF.      Assessment/Plan: Sepsis ( now resolved) Patient was  hypotensive and hypothermic  with rapid A. fib.   Sepsis could be related to UTI versus pneumonia ( ?Aspiration) associated with acute encephalopathy. - Blood cx negative.narrowed abx to levaquin and dced  ( received >7 days of abx)  New-onset acute systolic CHF EF of 05% with global hypokinesia. Per cardiology differential is broad secondary to  multivessel coronary disease versus diabetic cardiomyopathy versus takatsubo cardiomyopathy. Not a good candidate for cardiac catheterization or bypass surgery given her possible lung cancer recurrence , advanced chronic kidney disease, anemia and sepsis. -hold ASA given thrombocytopenia. . Continue bid Coreg.  - Not on ACE inhibitor, Lasix or Aldactone due to borderline blood pressure and AKI.   New-onset A. fib with RVR -  . Her CHADS VASC score is 4 . Was on coumadin in 2012 for afib which was dced as she  did not have any recurrence. Given anemia and recurrence of lung ca, cardiology feel she is not a candidate for long term anticoagulation. continue Coreg. Placed on low dose digoxin but held given worsening renal fn -was placed on baby ASA but i have held it given thrombocytopenia.   Acute encephalopathy Thought to be related to metabolic acidosis and sepsis. However remains persistent. TSH, B12 and ammonia level normal. Pending RPR and HIV. ordered thiamine level. Will placed on IV thiamine for 3 days ( until 1/19). Patient reportedly hallucinating and agitated at times. MRI brain negative for stroke or metastases.  -Added low-dose Seroquel daily. Will increase it to bid.   Thrombocytopenia Likely secondary to sepsis and medications . Progressive drop in plt noted. Discontinued heparin products and place SCDs. She was on vancomycin for sepsis and has been discontinued. Her 4Ts score is 3 ( low probability for HIT). HIT ab sent.  Seen by hematology and recommended to monitor for now.    hypokalemia Replenishing with KCl.     Elevated troponin Possibly demand ischemia with underlying acute CHF and rapid A. Fib. Continue aspirin.  Hypotension Required periodic IV fluid bolus. BP improved.  Left lower lobe pneumonia with small pleural effusion Continue O2 via nasal cannula. Transitioned to  oral levaquin  History of non-small cell lung cancer status post chemotherapy (completed in 2012), with CT evidence of recurrence. CT chest shows large soft tissue mass in the lower middle mediastinum representing an enlarged lymph node . Not in acute distress. Hospitalist discussed with patient's oncologist Dr. Julien Nordmann and recommended outpatient  follow-up.  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 Non anion gap acidosis resolved. Renal function not yet improved.   Anemia of  chronic disease Hemoglobin stable after 2 units PRBC transfused  Code Status:full  code Family Communication: Husband updated on the phone on 1/16 Disposition Plan: continue tele monitoring. Possible d/c to SNF if HR remains stable overnight. Encephalopathy persists. May take several days to recover.   Consultants:  PCCM  Cardiology  Procedures:  2-D echo  MRI brain  Antibiotics:  IV vancomycin and cefepime 1/8-1/12  Levaquin 1/12-1/16    HPI/Subjective: Patient seen and examined. Oriented to self but still confused. No hallucinations or agitations noted.      Objective: Filed Vitals:   08/25/14 0400  BP: 119/75  Pulse: 84  Temp: 97.8 F (36.6 C)  Resp: 16    Intake/Output Summary (Last 24 hours) at 08/25/14 1410 Last data filed at 08/25/14 1230  Gross per 24 hour  Intake    280 ml  Output      0 ml  Net    280 ml   Filed Weights   08/20/14 0500 08/21/14 0400 08/22/14 0358  Weight: 59.421 kg (131 lb) 52.164 kg (115 lb) 54.16 kg (119 lb 6.4 oz)    Exam:   General:  Elderly thin built female lying in bed, confused  HEENT: No pallor, moist oral mucosa  Chest: Clear breath sounds bilaterally  CVS: S1 and S2  irregular, no murmurs, rubs or gallop  Abdomen: Soft, nondistended, nontender, bowel sounds present  Extremities: Warm, no edema, no rash or purpura  CNS: AAOX1,    Data Reviewed: Basic Metabolic Panel:  Recent Labs Lab 08/19/14 0600  08/20/14 1605  08/21/14 0500 08/22/14 0455 08/23/14 0500 08/24/14 0415 08/25/14 0522  NA 137  < > 145  < > 146* 142 142 140 139  K 5.3*  < > 3.6  < > 3.7 3.3* 3.8 3.6 3.7  CL 118*  < > 110  < > 101 103 103 103 106  CO2 12*  < > 22  < > 27 28 26 25 28   GLUCOSE 120*  < > 135*  < > 134* 97 121* 91 96  BUN 36*  < > 33*  < > 28* 30* 34* 35* 39*  CREATININE 2.40*  < > 2.71*  < > 2.57* 2.60* 2.48* 2.37* 2.72*  CALCIUM 8.2*  < > 8.1*  < > 7.9* 7.8* 8.0* 8.3* 8.8  MG 1.1*  --  2.2  --   --   --   --   --   --   < > = values in this interval not displayed. Liver Function Tests: No  results for input(s): AST, ALT, ALKPHOS, BILITOT, PROT, ALBUMIN in the last 168 hours. No results for input(s): LIPASE, AMYLASE in the last 168 hours. No results for input(s): AMMONIA in the last 168 hours. CBC:  Recent Labs Lab 08/21/14 0500 08/22/14 0455 08/23/14 0500 08/24/14 0415 08/25/14 0522  WBC 8.8 10.2 12.4* 13.3* 13.3*  HGB 9.9* 10.7* 10.1* 10.5* 9.7*  HCT 28.2* 30.3* 28.9* 30.5* 28.6*  MCV 83.9 83.9 84.5 86.9 85.4  PLT 82* 61* 56* 64* 62*   Cardiac Enzymes: No results for input(s): CKTOTAL, CKMB, CKMBINDEX, TROPONINI in the last 168 hours. BNP (last 3 results) No results for input(s): PROBNP in the last 8760 hours. CBG:  Recent Labs Lab 08/19/14 2133  GLUCAP 133*    Recent Results (from the past 240 hour(s))  Urine culture     Status: None  Collection Time: 08/16/14  2:32 PM  Result Value Ref Range Status   Specimen Description URINE, CATHETERIZED  Final   Special Requests NONE  Final   Colony Count   Final    95,000 COLONIES/ML Performed at [facility]    Culture   Final    Multiple bacterial morphotypes present, none predominant. Suggest appropriate recollection if clinically indicated. Performed at [facility]    Report Status 08/18/2014 FINAL  Final  Culture, blood (routine x 2)     Status: None   Collection Time: 08/19/14 10:58 AM  Result Value Ref Range Status   Specimen Description BLOOD LEFT WRIST  Final   Special Requests BOTTLES DRAWN AEROBIC ONLY 5CC  Final   Culture   Final    NO GROWTH 5 DAYS Performed at [facility]    Report Status 08/25/2014 FINAL  Final  Culture, blood (routine x 2)     Status: None   Collection Time: 08/19/14  3:20 PM  Result Value Ref Range Status   Specimen Description BLOOD RIGHT HAND  Final   Special Requests BOTTLES DRAWN AEROBIC ONLY 0.5CC  Final   Culture   Final    NO GROWTH 5 DAYS Performed at [facility]    Report Status 08/25/2014 FINAL  Final  MRSA PCR  Screening     Status: None   Collection Time: 08/19/14  5:36 PM  Result Value Ref Range Status   MRSA by PCR NEGATIVE NEGATIVE Final    Comment:        The GeneXpert MRSA Assay (FDA approved for NASAL specimens only), is one component of a comprehensive MRSA colonization surveillance program. It is not intended to diagnose MRSA infection nor to guide or monitor treatment for MRSA infections.   Clostridium Difficile by PCR     Status: None   Collection Time: 08/22/14  3:11 PM  Result Value Ref Range Status   C difficile by pcr NEGATIVE NEGATIVE Final     Studies: No results found.  Scheduled Meds: . antiseptic oral rinse  7 mL Mouth Rinse BID  . aspirin  81 mg Oral Daily  . carvedilol  6.25 mg Oral BID WC  . feeding supplement (ENSURE COMPLETE)  237 mL Oral BID BM  . feeding supplement (RESOURCE BREEZE)  1 Container Oral TID BM  . folic acid  1 mg Oral Daily  . QUEtiapine  25 mg Oral Daily  . sodium chloride  3 mL Intravenous Q12H  . thiamine IV  100 mg Intravenous Daily   Continuous Infusions:      Time spent: 25 minutes    Zanita Millman  [facility] Pager [phone] If 7PM-7AM, please contact night-coverage at [email or web address], password [facility] 08/25/2014, 2:10 PM  LOS: 9 days

## 2014-08-25 NOTE — Progress Notes (Signed)
NUTRITION FOLLOW UP  DOCUMENTATION CODES Per approved criteria  -Severe malnutrition in the context of acute illness or injury   Pt meets criteria for severe MALNUTRITION in the context of acute illness as evidenced by moderate fat and muscle depletion, 20.9% wt loss x 3 months.  Intervention:    Resource Breeze po TID, each supplement provides 250 kcal and 9 grams of protein  Recommend liberalize diet to regular to increase PO intake. Patient will self-limit sodium intake as she is consuming 0-25% of meals.  New Nutrition Dx:   Inadequate oral intake related to altered mental status and dysphagia as evidenced by 0-25% meal completion. Ongoing.  Goal:   Intake to meet >90% of estimated nutrition needs. Unmet.  Monitor:   PO intake, labs, weight trend.  Assessment:   Patient admitted on 1/9 with AMS and forgetfulness for one week with unexplained weight loss, diarrhea, and flank pain.   S/P swallow evaluation with SLP on 1/12, 1/13, and 1/15. SLP recomends regular diet with thin liquids. Per RN notes, patient is pocketing and spitting out foods and medications at times. Intake of meals is minimal, consuming 0-25% of meals.  Patient unable to provide much information during RD visit, except, "fine" when asked how she was eating. Per previous RD note, patient does not like Ensure/Boost supplements. Will order Resource Breeze po TID to maximize oral intake.  Height: Ht Readings from Last 1 Encounters:  08/16/14 5\' 6"  (1.676 m)    Weight Status:   Wt Readings from Last 1 Encounters:  08/22/14 119 lb 6.4 oz (54.16 kg)    Re-estimated needs:  Kcal: 1400-1600 Protein: 70-80 gm Fluid: >/= 1.5 L  Skin: no issues  Diet Order: Diet 2 gram sodium   Intake/Output Summary (Last 24 hours) at 08/25/14 1309 Last data filed at 08/25/14 1230  Gross per 24 hour  Intake    280 ml  Output      0 ml  Net    280 ml    Last BM: 1/18   Labs:   Recent Labs Lab 08/19/14 0600   08/20/14 1605  08/23/14 0500 08/24/14 0415 08/25/14 0522  NA 137  < > 145  < > 142 140 139  K 5.3*  < > 3.6  < > 3.8 3.6 3.7  CL 118*  < > 110  < > 103 103 106  CO2 12*  < > 22  < > 26 25 28   BUN 36*  < > 33*  < > 34* 35* 39*  CREATININE 2.40*  < > 2.71*  < > 2.48* 2.37* 2.72*  CALCIUM 8.2*  < > 8.1*  < > 8.0* 8.3* 8.8  MG 1.1*  --  2.2  --   --   --   --   GLUCOSE 120*  < > 135*  < > 121* 91 96  < > = values in this interval not displayed.  CBG (last 3)  No results for input(s): GLUCAP in the last 72 hours.  Scheduled Meds: . antiseptic oral rinse  7 mL Mouth Rinse BID  . aspirin  81 mg Oral Daily  . carvedilol  6.25 mg Oral BID WC  . feeding supplement (ENSURE COMPLETE)  237 mL Oral BID BM  . folic acid  1 mg Oral Daily  . QUEtiapine  25 mg Oral Daily  . sodium chloride  3 mL Intravenous Q12H  . thiamine IV  100 mg Intravenous Daily    Continuous Infusions:  Molli Barrows, RD, LDN, [place] Pager [phone] After Hours Pager [phone]

## 2014-08-25 NOTE — Progress Notes (Signed)
Physical Therapy Treatment Patient Details Name: Madison [name] MRN: [id number] DOB: 1946-05-24 Today's Date: 08/25/2014    History of Present Illness Pt admit with AMS.  Afib.    PT Comments    Patient making slow progress with mobility.  Agree with need for SNF at d/c.  Follow Up Recommendations  SNF;Supervision/Assistance - 24 hour     Equipment Recommendations  Wheelchair (measurements PT)    Recommendations for Other Services       Precautions / Restrictions Precautions Precautions: Fall Restrictions Weight Bearing Restrictions: No    Mobility  Bed Mobility Overal bed mobility: Needs Assistance;+2 for physical assistance Bed Mobility: Rolling;Sidelying to Sit;Sit to Supine Rolling: Max assist Sidelying to sit: Max assist;+2 for physical assistance   Sit to supine: Max assist;+2 for physical assistance   General bed mobility comments: Verbal and tactile cues for technique.  Hand-over-hand assist with RUE to use rail to roll.  Assist to bring LE's off of bed and to bring trunk to sitting position.  Patient required mod assist to maintain sitting balance.  Patient sat EOB x 6 minutes.  Returned to supine with +2 max assist.  Transfers                    Ambulation/Gait                 Stairs            Wheelchair Mobility    Modified Rankin (Stroke Patients Only)       Balance Overall balance assessment: Needs assistance Sitting-balance support: Bilateral upper extremity supported;Feet supported Sitting balance-Leahy Scale: Poor Sitting balance - Comments: Patient sat EOB x 6 minutes with mod assist for balance.  Patient able to maintain balance with min guard assist for brief periods of 10 seconds.  Patient with very flexed posture in sitting with head downward.  Cues to try to look up and extend trunk in sitting.   Postural control: Posterior lean                          Cognition Arousal/Alertness: Lethargic Behavior  During Therapy: Flat affect Overall Cognitive Status: No family/caregiver present to determine baseline cognitive functioning       Memory: Decreased short-term memory              Exercises      General Comments        Pertinent Vitals/Pain Pain Assessment: Faces Faces Pain Scale: Hurts a little bit Pain Location: Back in sitting Pain Descriptors / Indicators: Sore Pain Intervention(s): Limited activity within patient's tolerance;Repositioned    Home Living                      Prior Function            PT Goals (current goals can now be found in the care plan section) Progress towards PT goals: Progressing toward goals    Frequency  Min 2X/week    PT Plan Current plan remains appropriate    Co-evaluation             End of Session   Activity Tolerance: Patient limited by lethargy;Patient limited by fatigue Patient left: in bed;with call bell/phone within reach     Time: 1120-1133 PT Time Calculation (min) (ACUTE ONLY): 13 min  Charges:  $Therapeutic Activity: 8-22 mins  G Codes:      Madison [name] 08/25/2014, 11:46 AM Madison [name]. Madison [name], Madison [name] Pager [phone]

## 2014-08-26 ENCOUNTER — Inpatient Hospital Stay (HOSPITAL_COMMUNITY): Payer: Medicare Other

## 2014-08-26 ENCOUNTER — Ambulatory Visit (HOSPITAL_COMMUNITY): Payer: 59

## 2014-08-26 ENCOUNTER — Other Ambulatory Visit: Payer: 59

## 2014-08-26 LAB — URINE MICROSCOPIC-ADD ON

## 2014-08-26 LAB — URINALYSIS, ROUTINE W REFLEX MICROSCOPIC
Glucose, UA: NEGATIVE mg/dL
Ketones, ur: 15 mg/dL — AB
Nitrite: NEGATIVE
PROTEIN: 30 mg/dL — AB
SPECIFIC GRAVITY, URINE: 1.02 (ref 1.005–1.030)
Urobilinogen, UA: 0.2 mg/dL (ref 0.0–1.0)
pH: 5 (ref 5.0–8.0)

## 2014-08-26 LAB — BASIC METABOLIC PANEL
ANION GAP: 14 (ref 5–15)
BUN: 46 mg/dL — AB (ref 6–23)
CALCIUM: 8.8 mg/dL (ref 8.4–10.5)
CHLORIDE: 105 meq/L (ref 96–112)
CO2: 23 mmol/L (ref 19–32)
CREATININE: 3.01 mg/dL — AB (ref 0.50–1.10)
GFR calc Af Amer: 17 mL/min — ABNORMAL LOW (ref >90)
GFR, EST NON AFRICAN AMERICAN: 15 mL/min — AB (ref >90)
GLUCOSE: 94 mg/dL (ref 70–99)
Potassium: 3.8 mmol/L (ref 3.5–5.1)
SODIUM: 142 mmol/L (ref 135–145)

## 2014-08-26 LAB — CBC
HCT: 27.3 % — ABNORMAL LOW (ref 36.0–46.0)
HEMOGLOBIN: 9 g/dL — AB (ref 12.0–15.0)
MCH: 28.8 pg (ref 26.0–34.0)
MCHC: 33 g/dL (ref 30.0–36.0)
MCV: 87.5 fL (ref 78.0–100.0)
Platelets: 64 10*3/uL — ABNORMAL LOW (ref 150–400)
RBC: 3.12 MIL/uL — AB (ref 3.87–5.11)
RDW: 19.6 % — AB (ref 11.5–15.5)
WBC: 9.8 10*3/uL (ref 4.0–10.5)

## 2014-08-26 LAB — CREATININE, URINE, RANDOM: Creatinine, Urine: 175.51 mg/dL

## 2014-08-26 LAB — HIV ANTIBODY (ROUTINE TESTING W REFLEX): HIV-1/HIV-2 Ab: NONREACTIVE

## 2014-08-26 LAB — AMMONIA: Ammonia: 18 umol/L (ref 11–32)

## 2014-08-26 LAB — GLUCOSE, CAPILLARY: GLUCOSE-CAPILLARY: 93 mg/dL (ref 70–99)

## 2014-08-26 LAB — SODIUM, URINE, RANDOM: Sodium, Ur: 10 mmol/L

## 2014-08-26 MED ORDER — SODIUM CHLORIDE 0.45 % IV SOLN
INTRAVENOUS | Status: DC
  Administered 2014-08-26 – 2014-08-27 (×2): via INTRAVENOUS

## 2014-08-26 MED ORDER — VITAMIN B-1 100 MG PO TABS
100.0000 mg | ORAL_TABLET | Freq: Every day | ORAL | Status: DC
  Administered 2014-08-26 – 2014-08-28 (×3): 100 mg via ORAL
  Filled 2014-08-26 (×3): qty 1

## 2014-08-26 MED ORDER — SODIUM CHLORIDE 0.9 % IV BOLUS (SEPSIS)
500.0000 mL | Freq: Once | INTRAVENOUS | Status: AC
  Administered 2014-08-26: 500 mL via INTRAVENOUS

## 2014-08-26 NOTE — Progress Notes (Signed)
[facility] PROGRESS NOTE  Madison [name] AOZ:[id number] DOB: 05/04/1946 DOA: 08/16/2014 PCP: Concha Norway, MD   Off service note:  69 year old female with history of squamous cell carcinoma of the lung previously treated with chemotherapy, completed on 07/14/2011, [facility], hypertension presented from home due to worsening confusion and mental status changes for one week. Patient had increased difficulty removing remaining appointments, history of loose stools, flank pain and weight loss. On admission she was found to be hyperkalemic with  UTIand sinus tachycardia in the ED. CT scan of the abdomen and pelvis showed necrotic lymph node suspicious for recurrence of her prior lung cancer. She was admitted for further management. Her hemoglobin dropped to 6.9 on the second day of admission. She was transfused with 2 units PRBC. She was also given IV fluids. Hospital course completed due to development of lung infiltrate, new-onset Afib with RVR, hypotension and new onset acute systolic CHF.    Assessment/Plan: Sepsis ( seems resolved) Patient was  hypotensive and hypothermic  with rapid A. Fib following admission.   Sepsis could be related to UTI versus pneumonia ( ?Aspiration) associated with acute encephalopathy. - Blood cx negative.narrowed abx to levaquin and dced  ( received >7 days of abx)  Acute encephalopathy Thought to be related to metabolic acidosis and sepsis. However remains persistent. TSH, B12 and ammonia level normal. Pending RPR.  HIV ab negative. ordered thiamine level. Given  IV thiamine for 3 days ( until 1/19). Patient reportedly hallucinating and agitated at times. More lethargic on 1/19. Infectious and metabolic w/up repeated ( UA, cxr, ammonia level) MRI brain negative for stroke or metastases.  -AddedSeroquel bid.   New-onset acute systolic CHF EF of 96% with global hypokinesia. Per cardiology differential is broad secondary to  multivessel coronary disease versus  diabetic cardiomyopathy versus takatsubo cardiomyopathy. Not a good candidate for cardiac catheterization or bypass surgery given her possible lung cancer recurrence , advanced chronic kidney disease, anemia and sepsis. -hold ASA given thrombocytopenia. . Continue bid Coreg.  - Not on ACE inhibitor, Lasix or Aldactone due to borderline blood pressure and AKI.   New-onset A. fib with RVR -  . Her CHADS VASC score is 4 . Was on coumadin in 2012 for afib which was dced as she did not have any recurrence. Given anemia and recurrence of lung ca, cardiology feel she is not a candidate for long term anticoagulation. continue Coreg. Placed on low dose digoxin but held given worsening renal fn. HR has been stable past 48 hrs. -was placed on baby ASA but held  given thrombocytopenia.  Thrombocytopenia Likely secondary to sepsis and medications . Progressive drop in plt noted. Discontinued heparin products and place SCDs. She was on vancomycin for sepsis and has been discontinued.  HIT ab negative. Seen by hematology and recommended to monitor for now.  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 Non anion gap acidosis resolved. Renal function worsened today. Reportedly follows with Dr Florene Glen as outpt. Check [place] abd and repeat UA. renal consulted.   hypokalemia Replenishing with KCl.   Elevated troponin Possibly demand ischemia with underlying acute CHF and rapid A. Fib. Continue aspirin.  Hypotension Required periodic IV fluid bolus. BP now improved.  Left lower lobe pneumonia with small pleural effusion Continue O2 via nasal cannula. Transitioned to  oral levaquin  History of non-small cell lung cancer status post chemotherapy (completed in 2012), with CT evidence of recurrence. CT chest shows large soft tissue mass  in the lower middle mediastinum representing an enlarged lymph node . Not in acute distress. Hospitalist discussed with patient's oncologist Dr.  Julien Nordmann and recommended outpatient  follow-up.   Anemia of  chronic disease Hemoglobin stable after 2 units PRBC transfused.  Code Status:full code  Family Communication: spoke with sisters at bedside  Disposition Plan:  D/c to SNF planned but has been prolonged due to ongoing encephalopathy , worsening renal function and rapid Afib ( afib stable past 48 hrs).    Consultants:  PCCM  Cardiology  Procedures:  2-D echo  MRI brain  Antibiotics:  IV vancomycin and cefepime 1/8-1/12  Levaquin 1/12-1/16    HPI/Subjective: Patient seen and examined. Patient more confused and sleepy today. She has not been taking her medications. He is awake periodically and as per family was talking with them at bedside this morning. During the day she was more drowsy and not eating at all. Vitals stable.      Objective: Filed Vitals:   08/26/14 1400  BP: 98/51  Pulse:   Temp:   Resp: 23    Intake/Output Summary (Last 24 hours) at 08/26/14 1454 Last data filed at 08/26/14 0824  Gross per 24 hour  Intake    120 ml  Output      0 ml  Net    120 ml   Filed Weights   08/21/14 0400 08/22/14 0358 08/26/14 0400  Weight: 52.164 kg (115 lb) 54.16 kg (119 lb 6.4 oz) 51.982 kg (114 lb 9.6 oz)    Exam:   General:  Elderly thin built female lying in bed, sleepy and less interactive  HEENT: No pallor, moist oral mucosa  Chest: Clear breath sounds bilaterally  CVS: S1 and S2  irregular, no murmurs, rubs or gallop  Abdomen: Soft, nondistended, nontender, bowel sounds present  Extremities: Warm, no edema, no rash or purpura  CNS: AAOX0,    Data Reviewed: Basic Metabolic Panel:  Recent Labs Lab 08/20/14 1605  08/22/14 0455 08/23/14 0500 08/24/14 0415 08/25/14 0522 08/26/14 0351  NA 145  < > 142 142 140 139 142  K 3.6  < > 3.3* 3.8 3.6 3.7 3.8  CL 110  < > 103 103 103 106 105  CO2 22  < > 28 26 25 28 23   GLUCOSE 135*  < > 97 121* 91 96 94  BUN 33*  < > 30* 34* 35*  39* 46*  CREATININE 2.71*  < > 2.60* 2.48* 2.37* 2.72* 3.01*  CALCIUM 8.1*  < > 7.8* 8.0* 8.3* 8.8 8.8  MG 2.2  --   --   --   --   --   --   < > = values in this interval not displayed. Liver Function Tests: No results for input(s): AST, ALT, ALKPHOS, BILITOT, PROT, ALBUMIN in the last 168 hours. No results for input(s): LIPASE, AMYLASE in the last 168 hours. No results for input(s): AMMONIA in the last 168 hours. CBC:  Recent Labs Lab 08/22/14 0455 08/23/14 0500 08/24/14 0415 08/25/14 0522 08/26/14 0351  WBC 10.2 12.4* 13.3* 13.3* 9.8  HGB 10.7* 10.1* 10.5* 9.7* 9.0*  HCT 30.3* 28.9* 30.5* 28.6* 27.3*  MCV 83.9 84.5 86.9 85.4 87.5  PLT 61* 56* 64* 62* 64*   Cardiac Enzymes: No results for input(s): CKTOTAL, CKMB, CKMBINDEX, TROPONINI in the last 168 hours. BNP (last 3 results) No results for input(s): PROBNP in the last 8760 hours. CBG:  Recent Labs Lab 08/19/14 2133 08/26/14 1352  GLUCAP  133* 93    Recent Results (from the past 240 hour(s))  Culture, blood (routine x 2)     Status: None   Collection Time: 08/19/14 10:58 AM  Result Value Ref Range Status   Specimen Description BLOOD LEFT WRIST  Final   Special Requests BOTTLES DRAWN AEROBIC ONLY 5CC  Final   Culture   Final    NO GROWTH 5 DAYS Performed at [facility]    Report Status 08/25/2014 FINAL  Final  Culture, blood (routine x 2)     Status: None   Collection Time: 08/19/14  3:20 PM  Result Value Ref Range Status   Specimen Description BLOOD RIGHT HAND  Final   Special Requests BOTTLES DRAWN AEROBIC ONLY 0.5CC  Final   Culture   Final    NO GROWTH 5 DAYS Performed at [facility]    Report Status 08/25/2014 FINAL  Final  MRSA PCR Screening     Status: None   Collection Time: 08/19/14  5:36 PM  Result Value Ref Range Status   MRSA by PCR NEGATIVE NEGATIVE Final    Comment:        The GeneXpert MRSA Assay (FDA approved for NASAL specimens only), is one component of  a comprehensive MRSA colonization surveillance program. It is not intended to diagnose MRSA infection nor to guide or monitor treatment for MRSA infections.   Clostridium Difficile by PCR     Status: None   Collection Time: 08/22/14  3:11 PM  Result Value Ref Range Status   C difficile by pcr NEGATIVE NEGATIVE Final     Studies: No results found.  Scheduled Meds: . antiseptic oral rinse  7 mL Mouth Rinse BID  . aspirin  81 mg Oral Daily  . carvedilol  6.25 mg Oral BID WC  . feeding supplement (ENSURE COMPLETE)  237 mL Oral BID BM  . feeding supplement (RESOURCE BREEZE)  1 Container Oral TID BM  . folic acid  1 mg Oral Daily  . QUEtiapine  25 mg Oral BID  . sodium chloride  3 mL Intravenous Q12H  . thiamine  100 mg Oral Daily   Continuous Infusions:      Time spent: 25 minutes    Racquel Arkin  [facility] Pager [phone] If 7PM-7AM, please contact night-coverage at [email or web address], password [facility] 08/26/2014, 2:54 PM  LOS: 10 days

## 2014-08-26 NOTE — Consult Note (Signed)
Requesting Physician:  Dr. Clementeen Graham Reason for Consult:  AKI on CKD HPI: The patient is a 69 y.o. year-old AAF with  Baseline CKD 3 (creatinine as recently as 04/2014 1.8 though has had episodes of AKI on CKD in the past, related to chemotherapy, diarrhea and volume depletion - followed by Dr. Florene Glen but not seen since 05/2012), with additional background history of HTN, SCCA of the lung (treated in the past with chemotherapy, and now felt to have a possible recurrence based on CT scan findings), atrial fibrillation.   Admitted to the hospital with encephalopathy (AMS/confusion/forgetfulness), profound  anemia (Hb 6.9 requiring transfusion), flank pain/pyuria (treated for UTI), possible LLL infiltrate, and new systolic heart failure with EF of 20-25% by ECHO (55% in 2013) and recurrent AF with RVR. Metabolic acidosis on admission with CO2 15-18 and no gap which appears to be chronic though not on bicarbonate on admission (treated with a bicarb drip, then oral bicarb - now off supplements).   She has had issues with hypotension during this admission and with her cardiac issues has variously been treated both with intermittent doses of furosemide as well as fluid boluses.    Her creatinine on admission was 2, close to her baseline, but has varied over this admission.  Over the past 3 days it has increased from 2.37 to 2.72 to 3.01 and we are asked to see.  Blood pressure has been low - more so over past couple of days.    Family reports she is still not herself, and that she has consumed very little in the way of food or fluids since the weekend. There is no UOP recorded and a bladder scan done earlier today showed only 19 cc of urine in the bladder.  Has been getting carvedilol at low doses. Has a fluid bolus ordered for this afternoon. No ACE, ARB or NSAID. Ultrasound shows bilaterally small kidneys. UA from admission dipped negative for protein, just showed pyuria and bacteruria. Urine showed 95,000  colonies no particular organism.  Creatinine trending is as follows:  CREATININE, SER  Date/Time Value Ref Range Status  08/26/2014 03:51 AM 3.01* 0.50 - 1.10 mg/dL Final  08/25/2014 05:22 AM 2.72* 0.50 - 1.10 mg/dL Final  08/24/2014 04:15 AM 2.37* 0.50 - 1.10 mg/dL Final  08/23/2014 05:00 AM 2.48* 0.50 - 1.10 mg/dL Final  08/22/2014 04:55 AM 2.60* 0.50 - 1.10 mg/dL Final  08/21/2014 05:00 AM 2.57* 0.50 - 1.10 mg/dL Final  08/20/2014 10:10 PM 2.67* 0.50 - 1.10 mg/dL Final  08/20/2014 04:05 PM 2.71* 0.50 - 1.10 mg/dL Final  08/20/2014 08:30 AM 2.60* 0.50 - 1.10 mg/dL Final  08/20/2014 12:20 AM 2.57* 0.50 - 1.10 mg/dL Final  08/19/2014 03:33 PM 2.41* 0.50 - 1.10 mg/dL Final  08/19/2014 06:00 AM 2.40* 0.50 - 1.10 mg/dL Final  08/18/2014 05:50 AM 2.01* 0.50 - 1.10 mg/dL Final  08/17/2014 03:10 AM 1.78* 0.50 - 1.10 mg/dL Final  08/16/2014 04:07 PM 2.00* 0.50 - 1.10 mg/dL Final       03/04/2014 1.9         04/29/2014 1.8         04/25/2012 05:30 AM 3.84* 0.50 - 1.10 mg/dL Final  04/24/2012 04:10 AM 4.22* 0.50 - 1.10 mg/dL Final  04/23/2012 05:40 AM 4.44* 0.50 - 1.10 mg/dL Final  04/22/2012 05:05 AM 4.60* 0.50 - 1.10 mg/dL Final  04/21/2012 04:16 AM 4.74* 0.50 - 1.10 mg/dL Final  04/20/2012 02:00 PM 4.98* 0.50 - 1.10 mg/dL Final  04/20/2012 05:20 AM  5.15* 0.50 - 1.10 mg/dL Final  04/19/2012 01:40 PM 5.31* 0.50 - 1.10 mg/dL Final  04/19/2012 06:07 AM 5.35* 0.50 - 1.10 mg/dL Final  04/18/2012 08:45 PM 6.29* 0.50 - 1.10 mg/dL Final  03/05/2012 12:02 PM 1.35* 0.50 - 1.10 mg/dL Final  12/30/2011 08:30 AM 1.38* 0.50 - 1.10 mg/dL Final  11/04/2011 09:11 AM 1.15* 0.50 - 1.10 mg/dL Final  09/22/2011 08:15 AM 1.68* 0.50 - 1.10 mg/dL Final  08/16/2011 09:17 AM 1.45* 0.50 - 1.10 mg/dL Final  07/14/2011 09:57 AM 1.66* 0.50 - 1.10 mg/dL Final  06/23/2011 12:05 PM 1.61* 0.50 - 1.10 mg/dL Final  06/02/2011 09:55 AM 1.29* 0.50 - 1.10 mg/dL Final    Past Medical History  Diagnosis Date  .  Hypertension   . Anemia   . Atrial fibrillation   . Chronic kidney disease   . lung ca     lung ca  . Lung cancer 11/11/10    Past Surgical History  Procedure Laterality Date  . Port a cath placement      Family History  Problem Relation Age of Onset  . Diabetes Mother   . Heart attack Father   . Hypertension Father   . Cancer Sister   . Stroke Brother   . Hypertension Brother   . Heart attack Brother   . Hypertension Brother    Social History:  reports that she quit smoking about 3 years ago. Her smoking use included Cigarettes. She has a 40 pack-year smoking history. She has never used smokeless tobacco. She reports that she does not drink alcohol or use illicit drugs.  Allergies  Allergen Reactions  . Lisinopril Swelling    Makes face swell    Home medications: Prior to Admission medications   Medication Sig Start Date End Date Taking? Authorizing Provider  diltiazem (CARDIZEM CD) 180 MG 24 hr capsule Take 1 capsule (180 mg total) by mouth daily. 12/20/13  Yes Blane Ohara, MD  folic acid (FOLVITE) 1 MG tablet Take 1 mg by mouth daily.   Yes Historical Provider, MD  lidocaine-prilocaine (EMLA) cream Apply 1 application topically as needed. For port-a-cath access.   Yes Historical Provider, MD  loratadine (CLARITIN) 10 MG tablet Take 10 mg by mouth daily.     Yes Historical Provider, MD  methylPREDNIsolone (MEDROL DOSPACK) 4 MG tablet follow package directions 08/13/14  Yes Curt Bears, MD  potassium chloride SA (K-DUR,KLOR-CON) 20 MEQ tablet Take 2 tablets (40 mEq total) by mouth 2 (two) times daily. 08/13/14  Yes Curt Bears, MD  Probiotic Product (PROBIOTIC PO) Take 1 tablet by mouth daily.   Yes Historical Provider, MD    Inpatient medications: . antiseptic oral rinse  7 mL Mouth Rinse BID  . aspirin  81 mg Oral Daily  . carvedilol  6.25 mg Oral BID WC  . feeding supplement (ENSURE COMPLETE)  237 mL Oral BID BM  . feeding supplement (RESOURCE BREEZE)  1  Container Oral TID BM  . folic acid  1 mg Oral Daily  . QUEtiapine  25 mg Oral BID  . sodium chloride  500 mL Intravenous Once  . sodium chloride  3 mL Intravenous Q12H  . thiamine  100 mg Oral Daily    Review of Systems Very lethargic, and responds to pain but will not answer questions for me Per family, poor po intake, no chest pain or SOB, no cough No diarrhea or abd pain now No dysuria (and making very little urine) No swelling  Physical  Exam:  BP 98/51 mmHg  Pulse 63  Temp(Src) 97.7 F (36.5 C) (Axillary)  Resp 23  Ht 5\' 6"  (1.676 m)  Wt 51.982 kg (114 lb 9.6 oz)  BMI 18.51 kg/m2  SpO2 97%  Gen: Frail thin AAF Eyes closed, will not open to questioning, but very clearly with appropriate pain responses Has right sided portacath - accessed Has right arm edema Skin: no rash, cyanosis Neck: no JVD, no bruits or LAN Chest: Grossly clear though effort not great Heart: Irregular. S1S2 No S3  No audible murmur. Heart sounds somewhat distant Abdomen: soft, no focal tenderness Ext: No edema whatsoever.  Very dysesthetic. Neuro: Responds to pain but not to my questions   Recent Labs Lab 08/20/14 2210 08/21/14 0500 08/22/14 0455 08/23/14 0500 08/24/14 0415 08/25/14 0522 08/26/14 0351  NA 144 146* 142 142 140 139 142  K 4.0 3.7 3.3* 3.8 3.6 3.7 3.8  CL 106 101 103 103 103 106 105  CO2 25 27 28 26 25 28 23   GLUCOSE 132* 134* 97 121* 91 96 94  BUN 32* 28* 30* 34* 35* 39* 46*  CREATININE 2.67* 2.57* 2.60* 2.48* 2.37* 2.72* 3.01*  CALCIUM 7.8* 7.9* 7.8* 8.0* 8.3* 8.8 8.8   Recent Labs Lab 08/23/14 0500 08/24/14 0415 08/25/14 0522 08/26/14 0351  WBC 12.4* 13.3* 13.3* 9.8  HGB 10.1* 10.5* 9.7* 9.0*  HCT 28.9* 30.5* 28.6* 27.3*  MCV 84.5 86.9 85.4 87.5  PLT 56* 64* 62* 64*    Recent Labs Lab 08/19/14 2133 08/26/14 1352  GLUCAP 133* 93    Xrays/Other Studies: US Renal  08/26/2014   CLINICAL DATA:  Acute renal injury; hypertension  EXAM: RENAL/URINARY TRACT  ULTRASOUND COMPLETE  COMPARISON:  CT abdomen and pelvis August 16, 2014 ; renal ultrasound April 19, 2012  FINDINGS: Right Kidney:  Length: 8.7 cm. Echogenicity is increased. The renal cortical thickness is within normal limits. No mass or hydronephrosis visualized. There is a small amount of perinephric fluid on the right. There is no sonographically demonstrable calculus or ureterectasis.  Left Kidney:  Length: 9.3 cm. Echogenicity is increased. Renal cortical thickness is within normal limits. No mass, perinephric fluid, or hydronephrosis visualized. No sonographically demonstrable calculus or ureterectasis.  Bladder:  Appears normal for degree of bladder distention.  Incidental note is made of a left pleural effusion.  IMPRESSION: Kidneys are rather small and echogenic consistent with medical renal disease. There is no hydronephrosis on either side. A small amount of perinephric fluid on the right is of uncertain etiology. There is a left pleural effusion.   Electronically Signed   By: Lowella Grip M.D.   On: 08/26/2014 15:21    Background: 69 yo AAF with CKD3 (baseline creatinine 1.8-2 Sept 2015) admitted with encephalopathy, ?UTI (pyuria and bacteruria without predominant organism on culture), AF with RVR, new cardiomyopathy with EF 20-25% by ECHO (previously around 55%), possible lung cancer recurrent with mediastinal and gastrohepatic ligament adenopathy on CT scan, who has developed AKI on CKD with creatinine up to 3, with oliguria.  Impression/Plan 1. AKI on CKD3 - Small echodense kidneys. UA on admission without proteinuria. Prior CKD attributed to nephrosclerosis.  Looks clinically dry to me. Has new systolic dysfunction with low EF and has had low BP's with probable cardiorenal component. This is superimposed on CKD 3 with bilaterally small kidneys and no renal reserve. Would favor some slow IVF in the face of her poor po and would consider holding carvedilol if systolic blood pressure  under 100 to allow for better renal perfusion. 2. Encephalopathy - not at baseline per family. Has not corrected with ATB's or correction of acidosis.MRI motion degraded but nothing obvious or acute   3. Cardiomyopathy with severe systolic dysfunction, EF 37%- cards following 4. AF with RVR - rate controlled. Carvedilol though low dose may contribute to low BP ->reduced renal perfusion. 5. 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  With her encephalopathy, despite lack of focality, head CT would seem to me to be in order. 6. Anemia - profound on admission. Post transfusion. No iron deficiency. Has trended dopwn some past 3 days. 7. Thrombocytopenia - persistent despite treatment of ?urine/pulm infection  8. Metabolic acidosis - treated earlier in admission. Resolved. Following off bicarb supplementation 9. Right upper extremity edema - on same side as portacath. Maybe central stenosis but should imaging be done to r/o DVT?  Thanks for consult. Will follow.  Jamal Maes,  MD [facility] [phone] pager 08/26/2014, 3:07 PM

## 2014-08-26 NOTE — Progress Notes (Signed)
Patient Name: Madison [name] Date of Encounter: 08/26/2014     Principal Problem:   Lung cancer Active Problems:   Anemia   CKD (chronic kidney disease) stage 3, GFR 30-59 ml/min   PAF (paroxysmal atrial fibrillation)   Metabolic acidosis   UTI (lower urinary tract infection)   Hyperkalemia   Altered mental status   Acute encephalopathy   Tachycardia   Elevated troponin   Abnormal EKG   Acute systolic congestive heart failure, NYHA class 4   Protein-calorie malnutrition, severe   Blood poisoning   Hypokalemia   Sepsis   Atrial fibrillation with RVR   Thrombocytopenia   Multifocal atrial tachycardia   Acute renal failure superimposed on stage 3 chronic kidney disease   Paroxysmal atrial fibrillation    SUBJECTIVE  Not very talkative. No CP or SOB. No complaints  CURRENT MEDS . antiseptic oral rinse  7 mL Mouth Rinse BID  . aspirin  81 mg Oral Daily  . carvedilol  6.25 mg Oral BID WC  . feeding supplement (ENSURE COMPLETE)  237 mL Oral BID BM  . feeding supplement (RESOURCE BREEZE)  1 Container Oral TID BM  . folic acid  1 mg Oral Daily  . QUEtiapine  25 mg Oral BID  . sodium chloride  3 mL Intravenous Q12H  . thiamine IV  100 mg Intravenous Daily    OBJECTIVE  Filed Vitals:   08/25/14 2000 08/26/14 0000 08/26/14 0400 08/26/14 0718  BP: 109/53   98/60  Pulse:    62  Temp: 97.3 F (36.3 C) 97.5 F (36.4 C) 97.4 F (36.3 C) 97.5 F (36.4 C)  TempSrc: Axillary Axillary Axillary Oral  Resp: 18   25  Height:      Weight:   114 lb 9.6 oz (51.982 kg)   SpO2: 97%   98%    Intake/Output Summary (Last 24 hours) at 08/26/14 0903 Last data filed at 08/26/14 0824  Gross per 24 hour  Intake    180 ml  Output      0 ml  Net    180 ml   Filed Weights   08/21/14 0400 08/22/14 0358 08/26/14 0400  Weight: 115 lb (52.164 kg) 119 lb 6.4 oz (54.16 kg) 114 lb 9.6 oz (51.982 kg)    PHYSICAL EXAM  General: Pleasant, NAD. Neuro: Alert and oriented X 3. Moves all  extremities spontaneously. Psych: Normal affect. HEENT:  Normal  Neck: irregular.  Lungs:  Decreased breath sounds  Heart: RRR no s3, s4, or murmurs. Abdomen: Soft, non-tender, non-distended, BS + x 4.  Extremities: No clubbing, cyanosis or edema. DP/PT/Radials 2+ and equal bilaterally. SCDs in place  Accessory Clinical Findings  CBC  Recent Labs  08/25/14 0522 08/26/14 0351  WBC 13.3* 9.8  HGB 9.7* 9.0*  HCT 28.6* 27.3*  MCV 85.4 87.5  PLT 62* 64*   Basic Metabolic Panel  Recent Labs  08/25/14 0522 08/26/14 0351  NA 139 142  K 3.7 3.8  CL 106 105  CO2 28 23  GLUCOSE 96 94  BUN 39* 46*  CREATININE 2.72* 3.01*  CALCIUM 8.8 8.8    TELE  afib with HR in  60s  Radiology/Studies  Ct Abdomen Pelvis Wo Contrast  08/16/2014   CLINICAL DATA:  Low back/right flank pain x3 days, history of lung cancer  EXAM: CT ABDOMEN AND PELVIS WITHOUT CONTRAST  TECHNIQUE: Multidetector CT imaging of the abdomen and pelvis was performed following the standard protocol without IV contrast.  COMPARISON:  CT abdomen pelvis dated 04/19/2012. Partial comparison to CT chest dated 10/23/2013.  FINDINGS: Lower chest:  Left lower lobe scarring/atelectasis.  3.5 x 3.0 cm lesion adjacent to the descending thoracic aorta (series 21/ image 1), measuring at the upper limits of normal for fluid density, incompletely visualized but new from prior CT. This appearance is worrisome for an necrotic node.  Hepatobiliary: Liver is unremarkable.  Gallbladder is notable for layering gallstones (series 21/ image 23). No associated inflammatory changes. No intrahepatic or extrahepatic ductal dilatation.  Pancreas: Within normal limits.  Spleen: Within normal limits.  Adrenals/Urinary Tract: Adrenal glands are unremarkable.  Kidneys are grossly unremarkable, noting a 10 mm probable cyst along the right lower kidney (series 21/image 25). No renal calculi or hydronephrosis.  No ureteral or bladder calculi.  Bladder is  unremarkable.  Stomach/Bowel: Stomach is notable for a small hiatal or paraesophageal hernia (Series 21/ image 7).  No evidence of bowel obstruction.  Normal appendix.  Colonic diverticulosis, without associated inflammatory changes.  Vascular/Lymphatic: Atherosclerotic calcifications of the abdominal aorta and branch vessels.  1.8 cm short axis gastrohepatic node (series 21/ image 13), new.  Reproductive: Uterus is unremarkable.  No adnexal masses.  Other: No abdominopelvic ascites.  Musculoskeletal: Degenerative changes of the visualized thoracolumbar spine.  Mild superior endplate compression fracture deformity at L1, unchanged from prior CT chest.  IMPRESSION: No evidence of bowel obstruction.  Normal appendix.  Cholelithiasis, without associated inflammatory changes.  No renal, ureteral, or bladder calculi.  3.5 x 3.0 cm suspected necrotic node adjacent to the descending thoracic aorta, incompletely visualized. Additional 1.8 cm short axis gastrohepatic node. These are new from prior CT and suspicious for nodal metastases.  Consider dedicated CT of the chest for further evaluation.   Electronically Signed   By: Julian Hy M.D.   On: 08/16/2014 18:12   Ct Chest Wo Contrast  08/17/2014   CLINICAL DATA:  69 year old female with history of squamous cell carcinoma of the lung.  EXAM: CT CHEST WITHOUT CONTRAST  TECHNIQUE: Multidetector CT imaging of the chest was performed following the standard protocol without IV contrast.  COMPARISON:  Chest CT 10/23/2013.  FINDINGS: Mediastinum/Lymph Nodes: Right internal jugular single-lumen porta cath with tip terminating at the superior cavoatrial junction. Heart size is normal. Trace amount of pericardial fluid and/or thickening, unlikely to be of any hemodynamic significance at this time. No associated pericardial calcification. 4.1 x 2.7 cm soft tissue attenuation lesion in the middle mediastinum abutting the left side of the descending thoracic aorta (image 36 of  series 201), and in close contact with the distal third of the esophagus.  Lungs/Pleura: Chronic postradiation changes of mass-like fibrosis in the right perihilar region are unchanged. Small bilateral pleural effusions are new compared to the prior examination. Extensive volume loss, architectural distortion and airspace consolidation in the basal segments of the left lower lobe. Scattered micronodules in the lungs bilaterally, similar to the prior examination, with the largest of these clustered in the anterior aspect of the right upper lobe, similar to the prior examination, favored to reflect predominantly areas of mucoid impaction.  Musculoskeletal/Soft Tissues: Multiple old vertebral body compression fractures, unchanged compared to the prior examination, involving predominantly the superior endplates of T2, T7, E42, T12 and L1, most severe at L1 where there is approximately 15% loss of anterior vertebral body height. There are no aggressive appearing lytic or blastic lesions noted in the visualized portions of the skeleton.  Upper Abdomen: Enlarged gastrohepatic ligament lymph node  measuring 1.9 cm in short axis.  IMPRESSION: 1. Large soft tissue mass in the lower middle mediastinum measuring up to 4.1 x 2.7 cm, likely to represent an enlarged lymph node. Given the gastrohepatic ligament lymphadenopathy, findings are concerning for potential nodal spread of disease in this patient with history of lung cancer, but are nonspecific. Consider further evaluation with PET-CT and biopsy for further diagnostic and staging purposes. 2. New areas of volume loss, architectural distortion airspace consolidation in the left lower lobe, compatible with developing pneumonia or sequela of recent aspiration (this appears new compared to yesterday's examination). 3. Small bilateral pleural effusions layering dependently, also new compared to yesterday's examination. 4. Chronic postradiation changes of mass-like fibrosis in the  perihilar aspect of the right lung. 5. Additional incidental findings, as above.   Electronically Signed   By: Vinnie Langton M.D.   On: 08/17/2014 13:05   Mr Brain Wo Contrast  08/17/2014   CLINICAL DATA:  Acute encephalopathy. Development of altered mental status and forgetfulness over the past week. History of lung cancer.  EXAM: MRI HEAD WITHOUT CONTRAST  TECHNIQUE: Multiplanar, multiecho pulse sequences of the brain and surrounding structures were obtained without intravenous contrast.  COMPARISON:  12/31/2010  FINDINGS: Due to patient's altered mental status and inability to follow instructions, images are overall moderately degraded by motion artifact despite using more motion resistant imaging protocols. Due to patient's mental status, the examination was terminated prematurely, and axial T1 and coronal T2 weighted images were not obtained.  There is no evidence of acute infarct, intracranial hemorrhage, mass, midline shift, or extra-axial fluid collection. There is mild to moderate cerebral atrophy. Small foci of T2 hyperintensity in the cerebral white matter are grossly unchanged from the prior study and nonspecific but compatible with mild chronic small vessel ischemic disease. 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  Orbits are unremarkable. There is mild left maxillary sinus mucosal thickening. Mastoid air cells are clear. Distal left vertebral artery flow void again appears small, similar to prior. Other major intracranial vascular flow voids are preserved.  IMPRESSION: 1. Motion degraded, incomplete examination as above. No acute infarct. 2. Mild chronic small vessel ischemic disease and chronic lacunar infarcts as above.   Electronically Signed   By: Logan Bores   On: 08/17/2014 14:40   Dg Chest Port 1 View  08/20/2014   CLINICAL DATA:  Shortness of breath, pneumonia  EXAM: PORTABLE CHEST - 1 VIEW   COMPARISON:  08/19/2014  FINDINGS: Cardiomediastinal silhouette is stable. Right IJ Port-A-Cath is unchanged in position. Slight improvement in aeration. Residual left base retrocardiac small atelectasis or infiltrate. No pulmonary edema.  IMPRESSION: Improvement in aeration. No pulmonary edema. Residual left base retrocardiac atelectasis or infiltrate.   Electronically Signed   By: Lahoma Crocker M.D.   On: 08/20/2014 08:18   Dg Chest Port 1 View  08/19/2014   CLINICAL DATA:  Acute respiratory failure  EXAM: PORTABLE CHEST - 1 VIEW  COMPARISON:  CT chest dated 08/17/2014  FINDINGS: Retrocardiac opacity, suspicious for pneumonia.  Patchy right lower lobe opacity, possibly atelectasis. Small bilateral pleural effusions. No frank interstitial edema.  The heart is top-normal in size.  Right chest power port terminates cavoatrial junction.  IMPRESSION: Retrocardiac opacity, suspicious for pneumonia.  Small bilateral pleural effusions.   Electronically Signed   By: Julian Hy M.D.   On: 08/19/2014 10:11   Dg Chest  Port 1 View  08/16/2014   CLINICAL DATA:  Initial evaluation for tachycardia, history of hypertension and atrial fibrillation as well as lung cancer  EXAM: PORTABLE CHEST - 1 VIEW  COMPARISON:  10/23/2013  FINDINGS: Heart size and vascular pattern are normal. Left lung is clear. Abnormal opacity right perihilar region consistent with radiation therapy change as described on prior CT scan. Right Port-A-Cath noted. Right lung otherwise clear.  IMPRESSION: No acute findings. Evidence of radiation change right parahilar region.   Electronically Signed   By: Skipper Cliche M.D.   On: 08/16/2014 19:07    ASSESSMENT AND PLAN 69 y.o. female with a past medical history significant for stage III lung cancer with possible recurrence, hypertension, atrial fibrillation and CKD3, who presented with altered mental status. Found to have elevated troponin levels and acute systolic CHF/ new cardiomyopathy with EF of  20-25% (EF was 55% in 2013).   Acute Systolic CHF/ Cardiomyopathy with EF of 20%: Stable from a volume standpoint. No dyspnea. Given her multiple comorbidities including a history of lung cancer with possible recurrence, stage III or 4 chronic kidney disease, urinary tract infection and recent anemia she is not a good candidate for cardiac catheterization or bypass surgery if she would be found to have multivessel coronary disease. Based on this, stress testing felt to be of little utility. Plan is to continue optimizing her heart failure medications and treating this medically. Continue with carvedilol. No ACE/ARB given CKD. ASA held with thrombocytopenia  Atrial Fibrillation: digoxin added for additional rate control but then discontinued with her rising creatanine. Currently resting rate is in the 60s. Continue BB -- Not a candidate for long term anticoagulation due to anemia and recurrence of lung CA. -- ASA held with thrombocytopenia  Sepsis- now resolved. With continue encephalopathy. Work up per IM  Hypotension- required periodic IV fluid bolus. BP improved.  Acute on chronic Stage III-IV kidney disease- creat up to 3.01 today Baseline creatinine of 1.8-2.1. Non anion gap acidosis resolved. Renal function not yet improved.   Thrombocytopenia- likely secondary to sepsis and medications . Progressive drop in plt noted. Discontinued heparin products and place SCDs.  -- Seen by hematology and recommended to monitor for now.  Judy Pimple PA-C  Pager [phone]  History and all data above reviewed.  Patient examined.  I agree with the findings as above.  She denies pain or SOB but she complains of weakness The patient exam reveals WUX:[id number]  ,  Lungs: Decreased breath sounds  ,  Abd: Mild tenderness, Ext No edema  .  All available labs, radiology testing, previous records reviewed. Agree with documented assessment and plan. Atrial fib:  Rate controlled on current meds.   Acute systolic HF:  Probably somewhat dry.  She is not on ACE/ARB or diuretic with CKD and hypotension.      Jeneen Rinks Valory Wetherby  10:07 AM  08/26/2014

## 2014-08-26 NOTE — Progress Notes (Signed)
Pt lethargic less responsive then this morning, vs are 98/51-68-23-97% room air . Family at bed side very concerned. MD paged, new order given for ammonia level, urinalysis and CXR. Will continue to monitor closely.  Ferdinand Lango, RN

## 2014-08-27 LAB — RENAL FUNCTION PANEL
ANION GAP: 8 (ref 5–15)
Albumin: 1.8 g/dL — ABNORMAL LOW (ref 3.5–5.2)
BUN: 51 mg/dL — AB (ref 6–23)
CHLORIDE: 110 meq/L (ref 96–112)
CO2: 24 mmol/L (ref 19–32)
Calcium: 8.8 mg/dL (ref 8.4–10.5)
Creatinine, Ser: 3.38 mg/dL — ABNORMAL HIGH (ref 0.50–1.10)
GFR calc Af Amer: 15 mL/min — ABNORMAL LOW (ref >90)
GFR, EST NON AFRICAN AMERICAN: 13 mL/min — AB (ref >90)
Glucose, Bld: 101 mg/dL — ABNORMAL HIGH (ref 70–99)
Phosphorus: 4.7 mg/dL — ABNORMAL HIGH (ref 2.3–4.6)
Potassium: 3.5 mmol/L (ref 3.5–5.1)
Sodium: 142 mmol/L (ref 135–145)

## 2014-08-27 MED ORDER — RESOURCE THICKENUP CLEAR PO POWD
ORAL | Status: DC | PRN
  Filled 2014-08-27: qty 125

## 2014-08-27 MED ORDER — QUETIAPINE FUMARATE 25 MG PO TABS
12.5000 mg | ORAL_TABLET | Freq: Two times a day (BID) | ORAL | Status: DC
  Administered 2014-08-27 – 2014-08-28 (×2): 12.5 mg via ORAL
  Filled 2014-08-27 (×2): qty 1

## 2014-08-27 NOTE — Evaluation (Signed)
Clinical/Bedside Swallow Evaluation Patient Details  Name: Madison [name] MRN: [id number] Date of Birth: 1946/06/08  Today's Date: 08/27/2014 Time: 1022-1040 SLP Time Calculation (min) (ACUTE ONLY): 18 min  Past Medical History:  Past Medical History  Diagnosis Date  . Hypertension   . Anemia   . Atrial fibrillation   . Chronic kidney disease   . lung ca     lung ca  . Lung cancer 11/11/10   Past Surgical History:  Past Surgical History  Procedure Laterality Date  . Port a cath placement     HPI:  69 year old female with a history of squamous cell carcinoma of the lung previously treated with chemotherapy which she completed 07/14/2011, CKD, hypertension presented from home secondary to increasing forgetfulness and mental status change for one week. Diagnosed UTI.  CT of the chest revealed a new 4.1 x 2.7 soft tissue mass in the lower mediastinum. MRI- no sign of stroke. MBS 2012 no aspiration. Swallow evaluation 08/19/14 with functional swallow impacted primarily by poor mentation.  Regular diet, thin liquids recommended.  F/u noted oral holding of POs due to inattention and MS, but no s/s of aspiration.  Pt D/Cd from SLP services 08/22/14.  Scheduled for D/C to SNF, but developed rapid afib.  Acute systolic CHF/ new cardiomyopathy with EF of 20-25%. Now with continued worsening encephalopathy, eating very litte, hence SLP swallow re-ordered.    Assessment / Plan / Recommendation Clinical Impression  Pt presents with lethargy, increased confusion since she was D/Cd from SLP services on 1/15.  Today, she presents with an acute reversible dysphagia due to MS changes.  Demonstrates poor bolus recognition, inability to sustain sufficent alertness nor drink from a straw.  Immediately coughed and expectorated after consumption of water. Limited nectar-thick liquids were swallowed without s/s of aspiration.  Recommend downgrading diet to a dysphagia 1 with nectar-thick liquids; give meds crushed  in puree.  Feed only when alert. SLP to resume f/u for safety/diet toleration.     Aspiration Risk  Moderate    Diet Recommendation Dysphagia 1 (Puree);Nectar-thick liquid   Liquid Administration via: Cup Medication Administration: Crushed with puree Supervision: Patient able to self feed;Staff to assist with self feeding Compensations: Slow rate;Small sips/bites;Check for pocketing Postural Changes and/or Swallow Maneuvers: Seated upright 90 degrees    Other  Recommendations Oral Care Recommendations: Oral care BID Other Recommendations: Order thickener from pharmacy   Follow Up Recommendations  Skilled Nursing facility    Frequency and Duration min 2x/week  2 weeks       SLP Swallow Goals     Swallow Study Prior Functional Status       General Date of Onset: 08/16/14 HPI: 69 year old female with a history of squamous cell carcinoma of the lung previously treated with chemotherapy which she completed 07/14/2011, CKD, hypertension presented from home secondary to increasing forgetfulness and mental status change for one week. Diagnosed UTI.  CT of the chest revealed a new 4.1 x 2.7 soft tissue mass in the lower mediastinum. MRI- no sign of stroke. MBS 2012 no aspiration. Swallow evaluation 08/19/14 with functional swallow impacted primarily by poor mentation.  Regular diet, thin liquids recommended.  F/u noted oral holding of POs due to inattention and MS, but no s/s of aspiration.  Pt D/Cd from SLP services 08/22/14.  Scheduled for D/C to SNF, but developed rapid afib.  Acute systolic CHF/ new cardiomyopathy with EF of 20-25%. Now with continued worsening encephalopathy, eating very litte, hence SLP swallow re-ordered.  Type of Study: Bedside swallow evaluation Previous Swallow Assessment: 08/19/14 Diet Prior to this Study: Regular;Thin liquids Temperature Spikes Noted: No Respiratory Status: Room air Behavior/Cognition: Confused;Lethargic Oral Cavity - Dentition: Adequate  natural dentition Patient Positioning: Upright in bed Baseline Vocal Quality: Clear Volitional Cough: Strong Volitional Swallow: Unable to elicit    Oral/Motor/Sensory Function Overall Oral Motor/Sensory Function: Appears within functional limits for tasks assessed   Ice Chips Ice chips: Not tested   Thin Liquid Thin Liquid: Impaired Presentation: Cup;Straw Oral Phase Impairments: Reduced labial seal;Poor awareness of bolus Oral Phase Functional Implications: Oral holding Pharyngeal  Phase Impairments: Cough - Immediate (expectoration of water)    Nectar Thick Nectar Thick Liquid: Impaired Presentation: Cup Oral Phase Impairments: Reduced labial seal;Poor awareness of bolus Pharyngeal Phase Impairments: Suspected delayed Swallow   Honey Thick Honey Thick Liquid: Not tested   Puree Puree: Impaired Presentation: Spoon Oral Phase Impairments: Poor awareness of bolus Oral Phase Functional Implications: Oral holding   Solid  Christi Wirick L. [place], [place] CCC/SLP Pager [phone]     Solid: Not tested       Juan Quam Laurice 08/27/2014,10:43 AM

## 2014-08-27 NOTE — Progress Notes (Signed)
[facility] Rounding Note Subjective:  A little more awake for me today No family in Still no UOP recorded Getting slow IVF at 50 BO low again this AM  Objective Vital signs in last 24 hours: Filed Vitals:   08/26/14 2300 08/27/14 0000 08/27/14 0030 08/27/14 0549  BP:   108/65 85/52  Pulse:   86 61  Temp: 97.3 F (36.3 C)   97.4 F (36.3 C)  TempSrc:    Oral  Resp:  19 20 22   Height:      Weight:    53.978 kg (119 lb)  SpO2:   96% 98%   Weight change: 1.996 kg (4 lb 6.4 oz)  Intake/Output Summary (Last 24 hours) at 08/27/14 2683 Last data filed at 08/26/14 2227  Gross per 24 hour  Intake     30 ml  Output      0 ml  Net     30 ml   Physical Exam:  BP 85/52 mmHg  Pulse 61  Temp(Src) 97.4 F (36.3 C) (Oral)  Resp 22  Ht 5\' 6"  (1.676 m)  Wt 53.978 kg (119 lb)  BMI 19.22 kg/m2  SpO2 98% Gen: Frail thin AAF Eyes closed, but opening eyes and answering some questions today - much more so than yesterday Has right sided portacath - accessed Has right arm edema Skin: no rash, cyanosis Neck: no JVD, no bruits or LAN Chest: Grossly clear though effort not great Heart: Regular this AM in the 60's S1S2 No S3 No audible murmur.  Abdomen: soft, + BS Ext: No edema whatsoever. Very dysesthetic.   Recent Labs Lab 08/21/14 0500 08/22/14 0455 08/23/14 0500 08/24/14 0415 08/25/14 0522 08/26/14 0351 08/27/14 0450  NA 146* 142 142 140 139 142 142  K 3.7 3.3* 3.8 3.6 3.7 3.8 3.5  CL 101 103 103 103 106 105 110  CO2 27 28 26 25 28 23 24   GLUCOSE 134* 97 121* 91 96 94 101*  BUN 28* 30* 34* 35* 39* 46* 51*  CREATININE 2.57* 2.60* 2.48* 2.37* 2.72* 3.01* 3.38*  CALCIUM 7.9* 7.8* 8.0* 8.3* 8.8 8.8 8.8  PHOS  --   --   --   --   --   --  4.7*   Recent Labs Lab 08/27/14 0450  ALBUMIN 1.8*    Recent Labs Lab 08/26/14 1430  AMMONIA 18   CBC:  Recent Labs Lab 08/23/14 0500 08/24/14 0415 08/25/14 0522 08/26/14 0351  WBC 12.4* 13.3* 13.3* 9.8   HGB 10.1* 10.5* 9.7* 9.0*  HCT 28.9* 30.5* 28.6* 27.3*  MCV 84.5 86.9 85.4 87.5  PLT 56* 64* 62* 64*    Recent Labs Lab 08/26/14 1352  GLUCAP 93   Results for Madison [name], Madison [name] (MRN [id number]) as of 08/27/2014 08:24  Ref. Range 08/26/2014 16:35  Sodium, Ur Latest Units: mmol/L 10  Creatinine, Urine Latest Units: mg/dL 175.51  Calculated fractional sodium excretion 0.14%   Iron Studies: No results for input(s): IRON, TIBC, TRANSFERRIN, FERRITIN in the last 168 hours. Studies/Results: Dg Chest 2 View  08/26/2014   CLINICAL DATA:  Acute encephalopathy.  EXAM: CHEST  2 VIEW  COMPARISON:  08/20/2014  FINDINGS: Right jugular Port-A-Cath remains in place with tip new the cavoatrial junction. Cardiac silhouette appears mildly enlarged. The patient has taken a shallower inspiration than on the prior study and there are increased hazy and patchy opacities in both lung bases. No overt pulmonary edema or pneumothorax is identified. There are small bilateral pleural  effusions which may have increased in size from the prior study although some of the differences could be due to differences in patient positioning. No acute osseous abnormality is seen.  IMPRESSION: Shallow inspiration with small bilateral pleural effusions and increased bibasilar lung opacities which may reflect atelectasis versus infectious infiltrate.   Electronically Signed   By: Logan Bores   On: 08/26/2014 16:56   US Renal  08/26/2014   CLINICAL DATA:  Acute renal injury; hypertension  EXAM: RENAL/URINARY TRACT ULTRASOUND COMPLETE  COMPARISON:  CT abdomen and pelvis August 16, 2014 ; renal ultrasound April 19, 2012  FINDINGS: Right Kidney:  Length: 8.7 cm. Echogenicity is increased. The renal cortical thickness is within normal limits. No mass or hydronephrosis visualized. There is a small amount of perinephric fluid on the right. There is no sonographically demonstrable calculus or ureterectasis.  Left Kidney:  Length: 9.3 cm.  Echogenicity is increased. Renal cortical thickness is within normal limits. No mass, perinephric fluid, or hydronephrosis visualized. No sonographically demonstrable calculus or ureterectasis.  Bladder:  Appears normal for degree of bladder distention.  Incidental note is made of a left pleural effusion.  IMPRESSION: Kidneys are rather small and echogenic consistent with medical renal disease. There is no hydronephrosis on either side. A small amount of perinephric fluid on the right is of uncertain etiology. There is a left pleural effusion.   Electronically Signed   By: Lowella Grip M.D.   On: 08/26/2014 15:21   Medications: . sodium chloride 50 mL/hr at 08/26/14 1752   . antiseptic oral rinse  7 mL Mouth Rinse BID  . aspirin  81 mg Oral Daily  . carvedilol  6.25 mg Oral BID WC  . feeding supplement (ENSURE COMPLETE)  237 mL Oral BID BM  . feeding supplement (RESOURCE BREEZE)  1 Container Oral TID BM  . folic acid  1 mg Oral Daily  . QUEtiapine  25 mg Oral BID  . sodium chloride  3 mL Intravenous Q12H  . thiamine  100 mg Oral Daily   Background: 69 yo AAF with CKD3 (baseline creatinine 1.8-2 Sept 2015) admitted with encephalopathy, ?UTI (pyuria and bacteruria without predominant organism on culture), AF with RVR, new cardiomyopathy with EF 20-25% by ECHO (previously around 55%), possible lung cancer recurrent with mediastinal and gastrohepatic ligament adenopathy on CT scan, who developed AKI on CKD with creatinine up to 3, with oliguria, at time of consultation 1/19.  Impression/Plan 1. AKI on CKD3 - Small echodense kidneys. UA on admission without proteinuria. Prior CKD attributed to nephrosclerosis. Looks clinically dry (although some small effusions on CXR). Has new systolic dysfunction with low EF and has had low BP's with likely cardiorenal component. Pre-renal/hypoperfusion state supported by low fractional sodium excretion.  This is superimposed on CKD 3 with bilaterally small  kidneys and no renal reserve. Creatinine continues to rise. Would favor continuing slow IVF in the face of her poor po and would hold carvedilol if systolic blood pressure under 100 to allow for better renal perfusion. Will have to address with cardiology whether we can use midodrine (and even  stop carvedilol and maybe use something else). Without a better BP renal perfusion remains compromised. I would place foley to document if ANY UOP. 2. Encephalopathy - not at baseline per family. Has not corrected with ATB's or correction of acidosis.MRI motion degraded but nothing obvious or acute. A little better this AM  3. Cardiomyopathy with severe systolic dysfunction, EF 82%- cards following 4. AF with  RVR - rate controlled. Today is in NSR. Carvedilol even though though low dose may contribute to low BP ->reduced renal perfusion. 5. 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 With her encephalopathy, despite lack of focality, head CT would seem to me to be in order. 6. Anemia - profound on admission. Post transfusion. No iron deficiency. Has trended down some past 3 days. 7. Thrombocytopenia - persistent despite treatment of ?urine/pulm infection  8. Metabolic acidosis - treated earlier in admission. Resolved. Following off bicarb supplementation 9. Right upper extremity edema - on same side as portacath. Maybe central stenosis but should imaging be done to r/o DVT?   Jamal Maes, MD [facility] [phone] pager 08/27/2014, 8:21 AM

## 2014-08-27 NOTE — Progress Notes (Signed)
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 Patient had increased difficulty removing remaining appointments, history of loose stools, flank pain and weight loss. On admission she was found to be hyperkalemic with  UTI and sinus tachycardia in the ED. CT scan of the abdomen and pelvis showed necrotic lymph node suspicious for recurrence of her prior lung cancer. She was admitted for further management. Her hemoglobin dropped to 6.9 on the second day of admission. She was transfused with 2 units PRBC. She was also given IV fluids. Hospital course complicated due to development of lung infiltrate, new-onset Afib with RVR, hypotension and new onset acute systolic CHF.    Assessment/Plan: Sepsis  Patient was  hypotensive and hypothermic with rapid A. Fib at admission.   Sepsis could be related to UTI versus pneumonia ( ?Aspiration) associated with acute encephalopathy. - Blood cx negative.narrowed abx to levaquin and dced  (received >7 days of abx)  Acute encephalopathy Thought to be related to metabolic acidosis and sepsis. However remains persistent. TSH, B12 and ammonia level normal. Pending RPR.  HIV ab negative. ordered thiamine level. Given  IV thiamine for 3 days (until 1/19).  - More lethargic on 1/19- appears to be dehydration -MRI brain negative for stroke or metastases.  -wean down seroquel  New-onset acute systolic CHF EF of 21% with global hypokinesia. Per cardiology differential is broad secondary to  multivessel coronary disease versus diabetic cardiomyopathy versus takatsubo cardiomyopathy. Not a good candidate for cardiac catheterization or bypass  surgery given her possible lung cancer recurrence , advanced chronic kidney disease, anemia and sepsis. - Not on ACE inhibitor, Lasix or Aldactone due to borderline blood pressure and AKI. -holding coreg due to hypotension- ? Midodrine per renal   New-onset A. fib with RVR - Her CHADS VASC score is 4 . Was on coumadin in 2012 for afib which was dced as she did not have any recurrence. Given anemia and recurrence of lung ca, cardiology feel she is not a candidate for long term anticoagulation.   Thrombocytopenia Likely secondary to sepsis and medications . Progressive drop in plt noted. Discontinued heparin products and place SCDs. She was on vancomycin for sepsis and has been discontinued.  HIT ab negative. Seen by hematology and recommended to monitor for now.  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 Non anion gap acidosis resolved. Renal function worsening  hypokalemia Replenishing with KCl.   Elevated troponin Possibly demand ischemia with underlying acute CHF and rapid A. Fib. Continue aspirin.  Hypotension Required periodic IV fluid bolus. meds for HTN held  Left lower lobe pneumonia with small pleural effusion Continue O2 via nasal cannula. Transitioned to  oral levaquin and treated  History of non-small cell lung cancer status post chemotherapy (completed in 2012), with CT evidence of recurrence. CT chest shows large soft tissue mass in the lower middle mediastinum representing an enlarged lymph node . Not in acute distress. Hospitalist discussed with patient's oncologist Dr. Julien Nordmann and recommended outpatient  follow-up.  Anemia of  chronic disease Hemoglobin stable after 2 units PRBC transfused.  SLP eval- DYS diet  Will get palliative care consult for GOC- son who is MPOA is receptive  Code Status:full code  Family Communication: spoke with son  Disposition Plan:   pending   Consultants:  PCCM  Cardiology  Procedures:  2-D echo  MRI brain  Antibiotics:  IV vancomycin and cefepime 1/8-1/12  Levaquin 1/12-1/16    HPI/Subjective: Per family more awake today then yesterday      Objective: Filed Vitals:   08/27/14 1028  BP:   Pulse:   Temp: 97.5 F (36.4 C)  Resp:     Intake/Output Summary (Last 24 hours) at 08/27/14 1118 Last data filed at 08/26/14 2227  Gross per 24 hour  Intake     10 ml  Output      0 ml  Net     10 ml   Filed Weights   08/22/14 0358 08/26/14 0400 08/27/14 0549  Weight: 54.16 kg (119 lb 6.4 oz) 51.982 kg (114 lb 9.6 oz) 53.978 kg (119 lb)    Exam:   General:  Elderly thin built female lying in bed, will answer questions- knows year but not where she is  Chest: Clear breath sounds bilaterally  CVS: S1 and S2  irregular, no murmurs, rubs or gallop  Abdomen: Soft, nondistended, nontender, bowel sounds present  Extremities: Warm, no edema, no rash or purpura  CNS: AAOX0,    Data Reviewed: Basic Metabolic Panel:  Recent Labs Lab 08/20/14 1605  08/23/14 0500 08/24/14 0415 08/25/14 0522 08/26/14 0351 08/27/14 0450  NA 145  < > 142 140 139 142 142  K 3.6  < > 3.8 3.6 3.7 3.8 3.5  CL 110  < > 103 103 106 105 110  CO2 22  < > 26 25 28 23 24   GLUCOSE 135*  < > 121* 91 96 94 101*  BUN 33*  < > 34* 35* 39* 46* 51*  CREATININE 2.71*  < > 2.48* 2.37* 2.72* 3.01* 3.38*  CALCIUM 8.1*  < > 8.0* 8.3* 8.8 8.8 8.8  MG 2.2  --   --   --   --   --   --   PHOS  --   --   --   --   --   --  4.7*  < > = values in this interval not displayed. Liver Function Tests:  Recent Labs Lab 08/27/14 0450  ALBUMIN 1.8*   No results for input(s): LIPASE, AMYLASE in the last 168 hours.  Recent Labs Lab 08/26/14 1430  AMMONIA 18   CBC:  Recent Labs Lab 08/22/14 0455 08/23/14 0500 08/24/14 0415 08/25/14 0522 08/26/14 0351  WBC 10.2 12.4* 13.3* 13.3* 9.8  HGB 10.7* 10.1* 10.5* 9.7* 9.0*   HCT 30.3* 28.9* 30.5* 28.6* 27.3*  MCV 83.9 84.5 86.9 85.4 87.5  PLT 61* 56* 64* 62* 64*   Cardiac Enzymes: No results for input(s): CKTOTAL, CKMB, CKMBINDEX, TROPONINI in the last 168 hours. BNP (last 3 results) No results for input(s): PROBNP in the last 8760 hours. CBG:  Recent Labs Lab 08/26/14 1352  GLUCAP 93    Recent Results (from the past 240 hour(s))  Culture, blood (routine x 2)     Status: None   Collection Time: 08/19/14 10:58 AM  Result Value Ref Range Status   Specimen Description BLOOD LEFT WRIST  Final   Special Requests BOTTLES DRAWN AEROBIC ONLY 5CC  Final   Culture   Final    NO GROWTH 5 DAYS Performed at [facility]    Report Status 08/25/2014 FINAL  Final  Culture, blood (routine x 2)  Status: None   Collection Time: 08/19/14  3:20 PM  Result Value Ref Range Status   Specimen Description BLOOD RIGHT HAND  Final   Special Requests BOTTLES DRAWN AEROBIC ONLY 0.5CC  Final   Culture   Final    NO GROWTH 5 DAYS Performed at [facility]    Report Status 08/25/2014 FINAL  Final  MRSA PCR Screening     Status: None   Collection Time: 08/19/14  5:36 PM  Result Value Ref Range Status   MRSA by PCR NEGATIVE NEGATIVE Final    Comment:        The GeneXpert MRSA Assay (FDA approved for NASAL specimens only), is one component of a comprehensive MRSA colonization surveillance program. It is not intended to diagnose MRSA infection nor to guide or monitor treatment for MRSA infections.   Clostridium Difficile by PCR     Status: None   Collection Time: 08/22/14  3:11 PM  Result Value Ref Range Status   C difficile by pcr NEGATIVE NEGATIVE Final     Studies: Dg Chest 2 View  08/26/2014   CLINICAL DATA:  Acute encephalopathy.  EXAM: CHEST  2 VIEW  COMPARISON:  08/20/2014  FINDINGS: Right jugular Port-A-Cath remains in place with tip new the cavoatrial junction. Cardiac silhouette appears mildly enlarged. The patient has taken a  shallower inspiration than on the prior study and there are increased hazy and patchy opacities in both lung bases. No overt pulmonary edema or pneumothorax is identified. There are small bilateral pleural effusions which may have increased in size from the prior study although some of the differences could be due to differences in patient positioning. No acute osseous abnormality is seen.  IMPRESSION: Shallow inspiration with small bilateral pleural effusions and increased bibasilar lung opacities which may reflect atelectasis versus infectious infiltrate.   Electronically Signed   By: Logan Bores   On: 08/26/2014 16:56   US Renal  08/26/2014   CLINICAL DATA:  Acute renal injury; hypertension  EXAM: RENAL/URINARY TRACT ULTRASOUND COMPLETE  COMPARISON:  CT abdomen and pelvis August 16, 2014 ; renal ultrasound April 19, 2012  FINDINGS: Right Kidney:  Length: 8.7 cm. Echogenicity is increased. The renal cortical thickness is within normal limits. No mass or hydronephrosis visualized. There is a small amount of perinephric fluid on the right. There is no sonographically demonstrable calculus or ureterectasis.  Left Kidney:  Length: 9.3 cm. Echogenicity is increased. Renal cortical thickness is within normal limits. No mass, perinephric fluid, or hydronephrosis visualized. No sonographically demonstrable calculus or ureterectasis.  Bladder:  Appears normal for degree of bladder distention.  Incidental note is made of a left pleural effusion.  IMPRESSION: Kidneys are rather small and echogenic consistent with medical renal disease. There is no hydronephrosis on either side. A small amount of perinephric fluid on the right is of uncertain etiology. There is a left pleural effusion.   Electronically Signed   By: Lowella Grip M.D.   On: 08/26/2014 15:21    Scheduled Meds: . antiseptic oral rinse  7 mL Mouth Rinse BID  . aspirin  81 mg Oral Daily  . feeding supplement (ENSURE COMPLETE)  237 mL Oral BID BM   . feeding supplement (RESOURCE BREEZE)  1 Container Oral TID BM  . folic acid  1 mg Oral Daily  . QUEtiapine  25 mg Oral BID  . sodium chloride  3 mL Intravenous Q12H  . thiamine  100 mg Oral Daily   Continuous Infusions: .  sodium chloride 50 mL/hr at 08/26/14 1752      Time spent: 35 minutes    Eulogio Bear  [facility] Pager [phone] If 7PM-7AM, please contact night-coverage at [email or web address], password [email or web address] 08/27/2014, 11:18 AM  LOS: 11 days

## 2014-08-27 NOTE — Progress Notes (Signed)
Patient Name: Madison [name] Date of Encounter: 08/27/2014     Principal Problem:   Lung cancer Active Problems:   Anemia   CKD (chronic kidney disease) stage 3, GFR 30-59 ml/min   PAF (paroxysmal atrial fibrillation)   Metabolic acidosis   UTI (lower urinary tract infection)   Hyperkalemia   Altered mental status   Acute encephalopathy   Tachycardia   Elevated troponin   Abnormal EKG   Acute systolic congestive heart failure, NYHA class 4   Protein-calorie malnutrition, severe   Blood poisoning   Hypokalemia   Sepsis   Atrial fibrillation with RVR   Thrombocytopenia   Multifocal atrial tachycardia   Acute renal failure superimposed on stage 3 chronic kidney disease   Paroxysmal atrial fibrillation    SUBJECTIVE  Not very talkative. No CP or SOB. No complaints. Eating breakfast  CURRENT MEDS . antiseptic oral rinse  7 mL Mouth Rinse BID  . aspirin  81 mg Oral Daily  . carvedilol  6.25 mg Oral BID WC  . feeding supplement (ENSURE COMPLETE)  237 mL Oral BID BM  . feeding supplement (RESOURCE BREEZE)  1 Container Oral TID BM  . folic acid  1 mg Oral Daily  . QUEtiapine  25 mg Oral BID  . sodium chloride  3 mL Intravenous Q12H  . thiamine  100 mg Oral Daily    OBJECTIVE  Filed Vitals:   08/26/14 2300 08/27/14 0000 08/27/14 0030 08/27/14 0549  BP:   108/65 85/52  Pulse:   86 61  Temp: 97.3 F (36.3 C)   97.4 F (36.3 C)  TempSrc:    Oral  Resp:  19 20 22   Height:      Weight:    119 lb (53.978 kg)  SpO2:   96% 98%    Intake/Output Summary (Last 24 hours) at 08/27/14 0855 Last data filed at 08/26/14 2227  Gross per 24 hour  Intake     10 ml  Output      0 ml  Net     10 ml   Filed Weights   08/22/14 0358 08/26/14 0400 08/27/14 0549  Weight: 119 lb 6.4 oz (54.16 kg) 114 lb 9.6 oz (51.982 kg) 119 lb (53.978 kg)    PHYSICAL EXAM  Gen: Frail thin AAF Eyes closed, but opening eyes and answering some questions today Has right sided portacath -  accessed Has right arm edema Skin: no rash, cyanosis Neck: no JVD, no bruits or LAN Chest: Grossly clear though effort not great Heart: Regular this AM in the 60's S1S2 No S3 No audible murmur.  Abdomen: soft, + BS Ext: No edema whatsoever. Very dysesthetic. Accessory Clinical Findings  CBC  Recent Labs  08/25/14 0522 08/26/14 0351  WBC 13.3* 9.8  HGB 9.7* 9.0*  HCT 28.6* 27.3*  MCV 85.4 87.5  PLT 62* 64*   Basic Metabolic Panel  Recent Labs  08/26/14 0351 08/27/14 0450  NA 142 142  K 3.8 3.5  CL 105 110  CO2 23 24  GLUCOSE 94 101*  BUN 46* 51*  CREATININE 3.01* 3.38*  CALCIUM 8.8 8.8  PHOS  --  4.7*    TELE  afib with HR in  80s  Radiology/Studies  Ct Abdomen Pelvis Wo Contrast  08/16/2014   CLINICAL DATA:  Low back/right flank pain x3 days, history of lung cancer  EXAM: CT ABDOMEN AND PELVIS WITHOUT CONTRAST  TECHNIQUE: Multidetector CT imaging of the abdomen and pelvis was performed  following the standard protocol without IV contrast.  COMPARISON:  CT abdomen pelvis dated 04/19/2012. Partial comparison to CT chest dated 10/23/2013.  FINDINGS: Lower chest:  Left lower lobe scarring/atelectasis.  3.5 x 3.0 cm lesion adjacent to the descending thoracic aorta (series 21/ image 1), measuring at the upper limits of normal for fluid density, incompletely visualized but new from prior CT. This appearance is worrisome for an necrotic node.  Hepatobiliary: Liver is unremarkable.  Gallbladder is notable for layering gallstones (series 21/ image 23). No associated inflammatory changes. No intrahepatic or extrahepatic ductal dilatation.  Pancreas: Within normal limits.  Spleen: Within normal limits.  Adrenals/Urinary Tract: Adrenal glands are unremarkable.  Kidneys are grossly unremarkable, noting a 10 mm probable cyst along the right lower kidney (series 21/image 25). No renal calculi or hydronephrosis.  No ureteral or bladder calculi.  Bladder is unremarkable.  Stomach/Bowel:  Stomach is notable for a small hiatal or paraesophageal hernia (Series 21/ image 7).  No evidence of bowel obstruction.  Normal appendix.  Colonic diverticulosis, without associated inflammatory changes.  Vascular/Lymphatic: Atherosclerotic calcifications of the abdominal aorta and branch vessels.  1.8 cm short axis gastrohepatic node (series 21/ image 13), new.  Reproductive: Uterus is unremarkable.  No adnexal masses.  Other: No abdominopelvic ascites.  Musculoskeletal: Degenerative changes of the visualized thoracolumbar spine.  Mild superior endplate compression fracture deformity at L1, unchanged from prior CT chest.  IMPRESSION: No evidence of bowel obstruction.  Normal appendix.  Cholelithiasis, without associated inflammatory changes.  No renal, ureteral, or bladder calculi.  3.5 x 3.0 cm suspected necrotic node adjacent to the descending thoracic aorta, incompletely visualized. Additional 1.8 cm short axis gastrohepatic node. These are new from prior CT and suspicious for nodal metastases.  Consider dedicated CT of the chest for further evaluation.   Electronically Signed   By: Julian Hy M.D.   On: 08/16/2014 18:12   Dg Chest 2 View  08/26/2014   CLINICAL DATA:  Acute encephalopathy.  EXAM: CHEST  2 VIEW  COMPARISON:  08/20/2014  FINDINGS: Right jugular Port-A-Cath remains in place with tip new the cavoatrial junction. Cardiac silhouette appears mildly enlarged. The patient has taken a shallower inspiration than on the prior study and there are increased hazy and patchy opacities in both lung bases. No overt pulmonary edema or pneumothorax is identified. There are small bilateral pleural effusions which may have increased in size from the prior study although some of the differences could be due to differences in patient positioning. No acute osseous abnormality is seen.  IMPRESSION: Shallow inspiration with small bilateral pleural effusions and increased bibasilar lung opacities which may  reflect atelectasis versus infectious infiltrate.   Electronically Signed   By: Logan Bores   On: 08/26/2014 16:56   Ct Chest Wo Contrast  08/17/2014   CLINICAL DATA:  69 year old female with history of squamous cell carcinoma of the lung.  EXAM: CT CHEST WITHOUT CONTRAST  TECHNIQUE: Multidetector CT imaging of the chest was performed following the standard protocol without IV contrast.  COMPARISON:  Chest CT 10/23/2013.  FINDINGS: Mediastinum/Lymph Nodes: Right internal jugular single-lumen porta cath with tip terminating at the superior cavoatrial junction. Heart size is normal. Trace amount of pericardial fluid and/or thickening, unlikely to be of any hemodynamic significance at this time. No associated pericardial calcification. 4.1 x 2.7 cm soft tissue attenuation lesion in the middle mediastinum abutting the left side of the descending thoracic aorta (image 36 of series 201), and in close contact with  the distal third of the esophagus.  Lungs/Pleura: Chronic postradiation changes of mass-like fibrosis in the right perihilar region are unchanged. Small bilateral pleural effusions are new compared to the prior examination. Extensive volume loss, architectural distortion and airspace consolidation in the basal segments of the left lower lobe. Scattered micronodules in the lungs bilaterally, similar to the prior examination, with the largest of these clustered in the anterior aspect of the right upper lobe, similar to the prior examination, favored to reflect predominantly areas of mucoid impaction.  Musculoskeletal/Soft Tissues: Multiple old vertebral body compression fractures, unchanged compared to the prior examination, involving predominantly the superior endplates of T2, T7, I71, T12 and L1, most severe at L1 where there is approximately 15% loss of anterior vertebral body height. There are no aggressive appearing lytic or blastic lesions noted in the visualized portions of the skeleton.  Upper Abdomen:  Enlarged gastrohepatic ligament lymph node measuring 1.9 cm in short axis.  IMPRESSION: 1. Large soft tissue mass in the lower middle mediastinum measuring up to 4.1 x 2.7 cm, likely to represent an enlarged lymph node. Given the gastrohepatic ligament lymphadenopathy, findings are concerning for potential nodal spread of disease in this patient with history of lung cancer, but are nonspecific. Consider further evaluation with PET-CT and biopsy for further diagnostic and staging purposes. 2. New areas of volume loss, architectural distortion airspace consolidation in the left lower lobe, compatible with developing pneumonia or sequela of recent aspiration (this appears new compared to yesterday's examination). 3. Small bilateral pleural effusions layering dependently, also new compared to yesterday's examination. 4. Chronic postradiation changes of mass-like fibrosis in the perihilar aspect of the right lung. 5. Additional incidental findings, as above.   Electronically Signed   By: Vinnie Langton M.D.   On: 08/17/2014 13:05   Mr Brain Wo Contrast  08/17/2014   CLINICAL DATA:  Acute encephalopathy. Development of altered mental status and forgetfulness over the past week. History of lung cancer.  EXAM: MRI HEAD WITHOUT CONTRAST  TECHNIQUE: Multiplanar, multiecho pulse sequences of the brain and surrounding structures were obtained without intravenous contrast.  COMPARISON:  12/31/2010  FINDINGS: Due to patient's altered mental status and inability to follow instructions, images are overall moderately degraded by motion artifact despite using more motion resistant imaging protocols. Due to patient's mental status, the examination was terminated prematurely, and axial T1 and coronal T2 weighted images were not obtained.  There is no evidence of acute infarct, intracranial hemorrhage, mass, midline shift, or extra-axial fluid collection. There is mild to moderate cerebral atrophy. Small foci of T2 hyperintensity  in the cerebral white matter are grossly unchanged from the prior study and nonspecific but compatible with mild chronic small vessel ischemic disease. 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  Orbits are unremarkable. There is mild left maxillary sinus mucosal thickening. Mastoid air cells are clear. Distal left vertebral artery flow void again appears small, similar to prior. Other major intracranial vascular flow voids are preserved.  IMPRESSION: 1. Motion degraded, incomplete examination as above. No acute infarct. 2. Mild chronic small vessel ischemic disease and chronic lacunar infarcts as above.   Electronically Signed   By: Logan Bores   On: 08/17/2014 14:40   US Renal  08/26/2014   CLINICAL DATA:  Acute renal injury; hypertension  EXAM: RENAL/URINARY TRACT ULTRASOUND COMPLETE  COMPARISON:  CT abdomen and pelvis August 16, 2014 ; renal ultrasound  April 19, 2012  FINDINGS: Right Kidney:  Length: 8.7 cm. Echogenicity is increased. The renal cortical thickness is within normal limits. No mass or hydronephrosis visualized. There is a small amount of perinephric fluid on the right. There is no sonographically demonstrable calculus or ureterectasis.  Left Kidney:  Length: 9.3 cm. Echogenicity is increased. Renal cortical thickness is within normal limits. No mass, perinephric fluid, or hydronephrosis visualized. No sonographically demonstrable calculus or ureterectasis.  Bladder:  Appears normal for degree of bladder distention.  Incidental note is made of a left pleural effusion.  IMPRESSION: Kidneys are rather small and echogenic consistent with medical renal disease. There is no hydronephrosis on either side. A small amount of perinephric fluid on the right is of uncertain etiology. There is a left pleural effusion.   Electronically Signed   By: Lowella Grip M.D.   On: 08/26/2014 15:21   Dg Chest Port 1  View  08/20/2014   CLINICAL DATA:  Shortness of breath, pneumonia  EXAM: PORTABLE CHEST - 1 VIEW  COMPARISON:  08/19/2014  FINDINGS: Cardiomediastinal silhouette is stable. Right IJ Port-A-Cath is unchanged in position. Slight improvement in aeration. Residual left base retrocardiac small atelectasis or infiltrate. No pulmonary edema.  IMPRESSION: Improvement in aeration. No pulmonary edema. Residual left base retrocardiac atelectasis or infiltrate.   Electronically Signed   By: Lahoma Crocker M.D.   On: 08/20/2014 08:18   Dg Chest Port 1 View  08/19/2014   CLINICAL DATA:  Acute respiratory failure  EXAM: PORTABLE CHEST - 1 VIEW  COMPARISON:  CT chest dated 08/17/2014  FINDINGS: Retrocardiac opacity, suspicious for pneumonia.  Patchy right lower lobe opacity, possibly atelectasis. Small bilateral pleural effusions. No frank interstitial edema.  The heart is top-normal in size.  Right chest power port terminates cavoatrial junction.  IMPRESSION: Retrocardiac opacity, suspicious for pneumonia.  Small bilateral pleural effusions.   Electronically Signed   By: Julian Hy M.D.   On: 08/19/2014 10:11   Dg Chest Port 1 View  08/16/2014   CLINICAL DATA:  Initial evaluation for tachycardia, history of hypertension and atrial fibrillation as well as lung cancer  EXAM: PORTABLE CHEST - 1 VIEW  COMPARISON:  10/23/2013  FINDINGS: Heart size and vascular pattern are normal. Left lung is clear. Abnormal opacity right perihilar region consistent with radiation therapy change as described on prior CT scan. Right Port-A-Cath noted. Right lung otherwise clear.  IMPRESSION: No acute findings. Evidence of radiation change right parahilar region.   Electronically Signed   By: Skipper Cliche M.D.   On: 08/16/2014 19:07    ASSESSMENT AND PLAN 69 y.o. female with a past medical history significant for stage III lung cancer with possible recurrence, hypertension, atrial fibrillation and CKD3, who presented with altered mental  status. Found to have elevated troponin levels and acute systolic CHF/ new cardiomyopathy with EF of 20-25% (EF was 55% in 2013).   Acute Systolic CHF/ Cardiomyopathy with EF of 20%: Stable from a volume standpoint. No dyspnea. Given her multiple comorbidities including a history of lung cancer with possible recurrence, stage III or 4 chronic kidney disease, urinary tract infection and recent anemia she is not a good candidate for cardiac catheterization or bypass surgery if she would be found to have multivessel coronary disease. Based on this, stress testing felt to be of little utility. No ACE/ARB given CKD. ASA held with thrombocytopenia -- Consider discontinuing Coreg with hypotension and to increase renal perfusion.   Hypotension- stop coreg? question as to  whether we can add midodrine or not. Dr. Percival Spanish to see.   Atrial Fibrillation: digoxin added for additional rate control but then discontinued with her rising creatanine. Currently resting rate is in the 80s.  -- Not a candidate for long term anticoagulation due to anemia and recurrence of lung CA. -- ASA held with thrombocytopenia  AKI on CKD3 - NOTE FROM NEPHROLOGY "Small echodense kidneys. UA on admission without proteinuria. Prior CKD attributed to nephrosclerosis. Looks clinically dry (although some small effusions on CXR). Has new systolic dysfunction with low EF and has had low BP's with likely cardiorenal component. Pre-renal/hypoperfusion state supported by low fractional sodium excretion. This is superimposed on CKD 3 with bilaterally small kidneys and no renal reserve. Creatinine continues to rise. Would favor continuing slow IVF in the face of her poor po and would hold carvedilol if systolic blood pressure under 100 to allow for better renal perfusion. Will have to address with cardiology whether we can use midodrine (and even stop carvedilol and maybe use something else). Without a better BP renal perfusion remains  compromised. I would place foley to document if ANY UOP."  Sepsis- now resolved. With continued encephalopathy. Work up per IM  Hypotension- required periodic IV fluid bolus. BP improved.  Thrombocytopenia- likely secondary to sepsis and medications . Progressive drop in plt noted. Discontinued heparin products and place SCDs.  -- Seen by hematology and recommended to monitor for now.  Judy Pimple PA-C  Pager [phone]  History and all data above reviewed.  Patient examined.  I agree with the findings as above.  The patient is more somnolent and clearly confused which is a change form when I examined her yesterday.  She has had a progressive decline during this hospitalization.  Continued rise in creat.  Hypotensive.  The patient exam reveals [id number]  ,  Lungs: Decreased breath sounds  ,  Abd: Positive bowel sounds, no rebound no guarding, Ext No edema  .  All available labs, radiology testing, previous records reviewed. Agree with documented assessment and plan. Hypotension:  She has not had her last two doses of beta blocker.  I will stop this. I don't know that the intake and output are accurate. Foley was just put in.  I agree with palliative care consult.  No plans for milrinone at this point.      Jeneen Rinks Beckem Tomberlin  11:09 AM  08/27/2014

## 2014-08-28 ENCOUNTER — Ambulatory Visit: Payer: 59 | Admitting: Physician Assistant

## 2014-08-28 DIAGNOSIS — M7989 Other specified soft tissue disorders: Secondary | ICD-10-CM

## 2014-08-28 DIAGNOSIS — Z66 Do not resuscitate: Secondary | ICD-10-CM

## 2014-08-28 DIAGNOSIS — Z515 Encounter for palliative care: Secondary | ICD-10-CM

## 2014-08-28 LAB — RENAL FUNCTION PANEL
Albumin: 2 g/dL — ABNORMAL LOW (ref 3.5–5.2)
Anion gap: 11 (ref 5–15)
BUN: 53 mg/dL — AB (ref 6–23)
CO2: 21 mmol/L (ref 19–32)
Calcium: 8.6 mg/dL (ref 8.4–10.5)
Chloride: 108 mEq/L (ref 96–112)
Creatinine, Ser: 3.38 mg/dL — ABNORMAL HIGH (ref 0.50–1.10)
GFR calc Af Amer: 15 mL/min — ABNORMAL LOW (ref >90)
GFR calc non Af Amer: 13 mL/min — ABNORMAL LOW (ref >90)
Glucose, Bld: 87 mg/dL (ref 70–99)
PHOSPHORUS: 4.5 mg/dL (ref 2.3–4.6)
Potassium: 3.4 mmol/L — ABNORMAL LOW (ref 3.5–5.1)
SODIUM: 140 mmol/L (ref 135–145)

## 2014-08-28 MED ORDER — SODIUM BICARBONATE 650 MG PO TABS
650.0000 mg | ORAL_TABLET | Freq: Two times a day (BID) | ORAL | Status: DC
  Administered 2014-08-28: 650 mg via ORAL
  Filled 2014-08-28: qty 1

## 2014-08-28 NOTE — Progress Notes (Signed)
[facility] PROGRESS NOTE  Madison [name] BZJ:[id number] DOB: 1946/04/07 DOA: 08/16/2014 PCP: Concha Norway, MD   Off service note:  69 year old female with history of squamous cell carcinoma of the lung previously treated with chemotherapy, completed on 07/14/2011, [facility], hypertension presented from home due to worsening confusion and mental status changes for one week. Patient had increased difficulty removing remaining appointments, history of loose stools, flank pain and weight loss. On admission she was found to be hyperkalemic with  UTI and sinus tachycardia in the ED. CT scan of the abdomen and pelvis showed necrotic lymph node suspicious for recurrence of her prior lung cancer. She was admitted for further management. Her hemoglobin dropped to 6.9 on the second day of admission. She was transfused with 2 units PRBC. She was also given IV fluids. Hospital course complicated due to development of lung infiltrate, new-onset Afib with RVR, hypotension and new onset acute systolic CHF.    Assessment/Plan: Sepsis  Patient was  hypotensive and hypothermic with rapid A. Fib at admission.   Sepsis could be related to UTI versus pneumonia ( ?Aspiration) associated with acute encephalopathy. - Blood cx negative.narrowed abx to levaquin and dced  (received >7 days of abx)  Acute encephalopathy Thought to be related to metabolic acidosis and sepsis. However remains persistent. TSH, B12 and ammonia level normal. Pending RPR.  HIV ab negative. ordered thiamine level. Given  IV thiamine for 3 days (until 1/19).  - More lethargic on 1/19- appears to be dehydration as patient has improved -MRI brain negative for stroke or metastases.  -wean down seroquel  New-onset acute systolic CHF EF of 01% with global hypokinesia. Per cardiology differential is broad secondary to  multivessel coronary disease versus diabetic cardiomyopathy versus takatsubo cardiomyopathy. Not a good candidate for cardiac  catheterization or bypass surgery given her possible lung cancer recurrence , advanced chronic kidney disease, anemia and sepsis. - Not on ACE inhibitor, Lasix or Aldactone due to borderline blood pressure and AKI. -holding coreg due to hypotension   New-onset A. fib with RVR - Her CHADS VASC score is 4 . Was on coumadin in 2012 for afib which was dced as she did not have any recurrence. Given anemia and recurrence of lung ca, cardiology feel she is not a candidate for long term anticoagulation.   Thrombocytopenia Likely secondary to sepsis and medications . Progressive drop in plt noted. Discontinued heparin products and place SCDs. She was on vancomycin for sepsis and has been discontinued.  HIT ab negative. Seen by hematology and recommended to monitor for now.  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 Non anion gap acidosis resolved. Renal function worsening  hypokalemia Replenishing with KCl.   Elevated troponin Possibly demand ischemia with underlying acute CHF and rapid A. Fib. Continue aspirin.  Hypotension Required periodic IV fluid bolus. meds for HTN held  Left lower lobe pneumonia with small pleural effusion Continue O2 via nasal cannula. Transitioned to  oral levaquin and treated  History of non-small cell lung cancer status post chemotherapy (completed in 2012), with CT evidence of recurrence. CT chest shows large soft tissue mass in the lower middle mediastinum representing an enlarged lymph node . Not in acute distress. Hospitalist discussed with patient's oncologist Dr. Julien Nordmann and recommended outpatient  follow-up.  Anemia of  chronic disease Hemoglobin stable after 2 units PRBC transfused.  SLP eval- DYS diet  Will get palliative care consult for GOC- son who is MPOA is receptive  Code Status:full code  Family Communication: spoke with son 1/20  Disposition Plan:   pending   Consultants:  PCCM  Cardiology  Procedures:  2-D echo  MRI brain  Antibiotics:  IV vancomycin and cefepime 1/8-1/12  Levaquin 1/12-1/16    HPI/Subjective: More awake today      Objective: Filed Vitals:   08/28/14 0816  BP:   Pulse:   Temp: 97.7 F (36.5 C)  Resp:     Intake/Output Summary (Last 24 hours) at 08/28/14 0902 Last data filed at 08/28/14 0700  Gross per 24 hour  Intake    120 ml  Output    200 ml  Net    -80 ml   Filed Weights   08/22/14 0358 08/26/14 0400 08/27/14 0549  Weight: 54.16 kg (119 lb 6.4 oz) 51.982 kg (114 lb 9.6 oz) 53.978 kg (119 lb)    Exam:   General:  Opens eyes and responds to questions  Chest: Clear breath sounds bilaterally  CVS: S1 and S2  irregular, no murmurs, rubs or gallop  Abdomen: Soft, nondistended, nontender, bowel sounds present  Extremities: Warm, no edema, no rash or purpura   Data Reviewed: Basic Metabolic Panel:  Recent Labs Lab 08/24/14 0415 08/25/14 0522 08/26/14 0351 08/27/14 0450 08/28/14 0315  NA 140 139 142 142 140  K 3.6 3.7 3.8 3.5 3.4*  CL 103 106 105 110 108  CO2 25 28 23 24 21   GLUCOSE 91 96 94 101* 87  BUN 35* 39* 46* 51* 53*  CREATININE 2.37* 2.72* 3.01* 3.38* 3.38*  CALCIUM 8.3* 8.8 8.8 8.8 8.6  PHOS  --   --   --  4.7* 4.5   Liver Function Tests:  Recent Labs Lab 08/27/14 0450 08/28/14 0315  ALBUMIN 1.8* 2.0*   No results for input(s): LIPASE, AMYLASE in the last 168 hours.  Recent Labs Lab 08/26/14 1430  AMMONIA 18   CBC:  Recent Labs Lab 08/22/14 0455 08/23/14 0500 08/24/14 0415 08/25/14 0522 08/26/14 0351  WBC 10.2 12.4* 13.3* 13.3* 9.8  HGB 10.7* 10.1* 10.5* 9.7* 9.0*  HCT 30.3* 28.9* 30.5* 28.6* 27.3*  MCV 83.9 84.5 86.9 85.4 87.5  PLT 61* 56* 64* 62* 64*   Cardiac Enzymes: No results for input(s): CKTOTAL, CKMB, CKMBINDEX, TROPONINI in the last 168 hours. BNP (last 3 results) No results for input(s): PROBNP in the last  8760 hours. CBG:  Recent Labs Lab 08/26/14 1352  GLUCAP 93    Recent Results (from the past 240 hour(s))  Culture, blood (routine x 2)     Status: None   Collection Time: 08/19/14 10:58 AM  Result Value Ref Range Status   Specimen Description BLOOD LEFT WRIST  Final   Special Requests BOTTLES DRAWN AEROBIC ONLY 5CC  Final   Culture   Final    NO GROWTH 5 DAYS Performed at [facility]    Report Status 08/25/2014 FINAL  Final  Culture, blood (routine x 2)     Status: None   Collection Time: 08/19/14  3:20 PM  Result Value Ref Range Status   Specimen Description BLOOD RIGHT HAND  Final   Special Requests BOTTLES DRAWN AEROBIC ONLY 0.5CC  Final   Culture   Final    NO GROWTH 5 DAYS Performed at [facility]    Report Status 08/25/2014 FINAL  Final  MRSA PCR Screening     Status: None   Collection Time: 08/19/14  5:36 PM  Result Value Ref Range Status  MRSA by PCR NEGATIVE NEGATIVE Final    Comment:        The GeneXpert MRSA Assay (FDA approved for NASAL specimens only), is one component of a comprehensive MRSA colonization surveillance program. It is not intended to diagnose MRSA infection nor to guide or monitor treatment for MRSA infections.   Clostridium Difficile by PCR     Status: None   Collection Time: 08/22/14  3:11 PM  Result Value Ref Range Status   C difficile by pcr NEGATIVE NEGATIVE Final     Studies: Dg Chest 2 View  08/26/2014   CLINICAL DATA:  Acute encephalopathy.  EXAM: CHEST  2 VIEW  COMPARISON:  08/20/2014  FINDINGS: Right jugular Port-A-Cath remains in place with tip new the cavoatrial junction. Cardiac silhouette appears mildly enlarged. The patient has taken a shallower inspiration than on the prior study and there are increased hazy and patchy opacities in both lung bases. No overt pulmonary edema or pneumothorax is identified. There are small bilateral pleural effusions which may have increased in size from the prior study  although some of the differences could be due to differences in patient positioning. No acute osseous abnormality is seen.  IMPRESSION: Shallow inspiration with small bilateral pleural effusions and increased bibasilar lung opacities which may reflect atelectasis versus infectious infiltrate.   Electronically Signed   By: Logan Bores   On: 08/26/2014 16:56   US Renal  08/26/2014   CLINICAL DATA:  Acute renal injury; hypertension  EXAM: RENAL/URINARY TRACT ULTRASOUND COMPLETE  COMPARISON:  CT abdomen and pelvis August 16, 2014 ; renal ultrasound April 19, 2012  FINDINGS: Right Kidney:  Length: 8.7 cm. Echogenicity is increased. The renal cortical thickness is within normal limits. No mass or hydronephrosis visualized. There is a small amount of perinephric fluid on the right. There is no sonographically demonstrable calculus or ureterectasis.  Left Kidney:  Length: 9.3 cm. Echogenicity is increased. Renal cortical thickness is within normal limits. No mass, perinephric fluid, or hydronephrosis visualized. No sonographically demonstrable calculus or ureterectasis.  Bladder:  Appears normal for degree of bladder distention.  Incidental note is made of a left pleural effusion.  IMPRESSION: Kidneys are rather small and echogenic consistent with medical renal disease. There is no hydronephrosis on either side. A small amount of perinephric fluid on the right is of uncertain etiology. There is a left pleural effusion.   Electronically Signed   By: Lowella Grip M.D.   On: 08/26/2014 15:21    Scheduled Meds: . antiseptic oral rinse  7 mL Mouth Rinse BID  . aspirin  81 mg Oral Daily  . feeding supplement (ENSURE COMPLETE)  237 mL Oral BID BM  . feeding supplement (RESOURCE BREEZE)  1 Container Oral TID BM  . folic acid  1 mg Oral Daily  . QUEtiapine  12.5 mg Oral BID  . sodium bicarbonate  650 mg Oral BID  . sodium chloride  3 mL Intravenous Q12H  . thiamine  100 mg Oral Daily   Continuous  Infusions: . sodium chloride 50 mL/hr at 08/27/14 1314      Time spent: 25 minutes    Eulogio Bear  [facility] Pager [phone] If 7PM-7AM, please contact night-coverage at [email or web address], password [facility] 08/28/2014, 9:02 AM  LOS: 12 days

## 2014-08-28 NOTE — Progress Notes (Signed)
Pt continues to pocket food in her mouth. She attempts to swallow but is unable to. Was able to suction the food that the pt was unable to spit up herself. Night Dr. Ree Kida and pt has been made NPO. Will continue to monitor.   Madison [name]

## 2014-08-28 NOTE — Progress Notes (Signed)
Speech Language Pathology Treatment: Dysphagia  Patient Details Name: THANH POMERLEAU MRN: [id number] DOB: 1945/11/15 Today's Date: 08/28/2014 Time: 3151-7616 SLP Time Calculation (min) (ACUTE ONLY): 42 min  Assessment / Plan / Recommendation Clinical Impression  Physical therapist reports that pt was holding pudding mixed with saliva orally, and could not swallow or spit out.  Eventually, the pudding and saliva were removed by PT with washcloth and suction was obtained. SLP session consisted of providing oral care, family education with 2 sisters, and re-assessing swallow with ice chip and sip of water via tsp.  Pt swallowed the ice after brief oral holding, but held the water in her mouth and was unable to initiate a swallow.  Pt would not allow SLP to suction, but would not spit or swallow.  Eventually, pt began to cough and SLP suctioned.  Pt is only taking bites, and then holds boluses orally.  Discussed need for full supervision and assist during all po's with RN, with frequent oral care, and always checking mouth for pocketing.  Question if family would be open to Palliative Care and change in code status (pt is currently a full code).  Nutrition needs are not being met with po's, as aspiration is a risk, mostly due to cognitive issues.   HPI HPI: 69 year old female with a history of squamous cell carcinoma of the lung previously treated with chemotherapy which she completed 07/14/2011, CKD, hypertension presented from home secondary to increasing forgetfulness and mental status change for one week. Diagnosed UTI.  CT of the chest revealed a new 4.1 x 2.7 soft tissue mass in the lower mediastinum. MRI- no sign of stroke. MBS 2012 no aspiration. Swallow evaluation 08/19/14 with functional swallow impacted primarily by poor mentation.  Regular diet, thin liquids recommended.  F/u noted oral holding of POs due to inattention and MS, but no s/s of aspiration.  Pt D/Cd from SLP services 08/22/14.   Scheduled for D/C to SNF, but developed rapid afib.  Acute systolic CHF/ new cardiomyopathy with EF of 20-25%. Now with continued worsening encephalopathy, eating very litte, hence SLP swallow re-ordered.    Pertinent Vitals Pain Assessment: No/denies pain  SLP Plan  Continue with current plan of care    Recommendations Diet recommendations: Dysphagia 1 (puree);Nectar-thick liquid Liquids provided via: Cup Medication Administration: Crushed with puree Supervision: Staff to assist with self feeding;Full supervision/cueing for compensatory strategies Compensations: Slow rate;Small sips/bites;Check for pocketing Postural Changes and/or Swallow Maneuvers: Seated upright 90 degrees              Oral Care Recommendations: Oral care Q4 per protocol;Staff/trained caregiver to provide oral care Follow up Recommendations: Skilled Nursing facility Plan: Continue with current plan of care    GO     Quinn Axe T 08/28/2014, 3:33 PM

## 2014-08-28 NOTE — Progress Notes (Signed)
    SUBJECTIVE:  She is awake and denies pain or SOB   PHYSICAL EXAM Filed Vitals:   08/27/14 2045 08/28/14 0011 08/28/14 0400 08/28/14 0816  BP: 113/57 105/85 112/63   Pulse: 77 88 86   Temp: 97.9 F (36.6 C) 97.5 F (36.4 C) 97.7 F (36.5 C) 97.7 F (36.5 C)  TempSrc: Axillary Axillary Axillary Axillary  Resp: 20 24 20    Height:      Weight:      SpO2: 99% 96% 97%    General:  Chronically ill appearing Lungs:  Decreased breath sounds Heart:  RRR Abdomen:  Positive bowel sounds, no rebound no guarding Extremities:  No edema   LABS:  Results for orders placed or performed during the hospital encounter of 08/16/14 (from the past 24 hour(s))  Renal function panel     Status: Abnormal   Collection Time: 08/28/14  3:15 AM  Result Value Ref Range   Sodium 140 135 - 145 mmol/L   Potassium 3.4 (L) 3.5 - 5.1 mmol/L   Chloride 108 96 - 112 mEq/L   CO2 21 19 - 32 mmol/L   Glucose, Bld 87 70 - 99 mg/dL   BUN 53 (H) 6 - 23 mg/dL   Creatinine, Ser 3.38 (H) 0.50 - 1.10 mg/dL   Calcium 8.6 8.4 - 10.5 mg/dL   Phosphorus 4.5 2.3 - 4.6 mg/dL   Albumin 2.0 (L) 3.5 - 5.2 g/dL   GFR calc non Af Amer 13 (L) >90 mL/min   GFR calc Af Amer 15 (L) >90 mL/min   Anion gap 11 5 - 15    Intake/Output Summary (Last 24 hours) at 08/28/14 0949 Last data filed at 08/28/14 0700  Gross per 24 hour  Intake    120 ml  Output    200 ml  Net    -80 ml     ASSESSMENT AND PLAN:  CARDIOMYOPATHY:  Her BP is slightly better but I will not restart beta blocker.  I suspect that she is still dry.  She is taking in no PO.  I will increase her hydration slightly.  ATRIAL FIB:  She does go into atrial fib but her rate is well controlled.  No change in therapy.   CKD:  Creat is elevated but stable.    I/O incomplete.   She seems to be making little urine.    Madison [name] [facility] 08/28/2014 9:49 AM

## 2014-08-28 NOTE — Plan of Care (Signed)
Problem: SLP Dysphagia Goals Goal: Patient will utilize recommended strategies Patient will utilize recommended strategies during swallow to increase swallowing safety with  Outcome: Not Progressing Pt holding boluses orally due to cognitive deficits.

## 2014-08-28 NOTE — Consult Note (Signed)
Patient Madison [name]      DOB: Dec 19, 1945      XTK:[id number]     Consult Note from the Palliative Medicine Team at [facility] Requested by: Dr Eliseo Squires     PCP: Juliann Mule, DAVID, MD Reason for Consultation: Clarification of [place]     Phone [phone]  Assessment of patients Current state:  Continued physical and functional and cognitive decline 2/2 to multiple co-morbid ites and likely reoccurance of lung cancer as evidenced on CT.  Family faced with advanced directive decisions and anticipatory care needs.  Consult is for review of medical treatment options, clarification of goals of care and end of life issues, disposition and options, and symptom recommendation.  This NP Wadie Lessen reviewed medical records, received report from team, assessed the patient and then meet at the patient's bedside along with husband, two sisters and son by telephone  to discuss diagnosis prognosis, [place], EOL wishes disposition and options.  A detailed discussion was had today regarding advanced directives.  Concepts specific to code status, artifical feeding and hydration, continued IV antibiotics and rehospitalization was had.  The difference between a aggressive medical intervention path  and a palliative comfort care path for this patient at this time was had.  Values and goals of care important to patient and family were attempted to be elicited.  Concept of Hospice and Palliative Care were discussed  Natural trajectory and expectations at EOL were discussed.  Questions and concerns addressed.  Hard Choices booklet left for review. Family encouraged to call with questions or concerns.  PMT will continue to support holistically.  Family tell me they have a scheduled 6 pm meeting tonight with a physician, they are unsure of name of that person?   Goals of Care: 1.  Code Status: Full code  -strongly encouraged family to consider DNR status understanding known poor outcomes In like  patients  2. Scope of Treatment:  At this time family is in the difficult situation of making advanced directive decisions as it relates to artifical feeding and hydration, code status and anticipatory care needs.  I am available by phone for this family for all questions and concerns  3. Disposition: Pending outcomes to above decisions.  Family will needs continued support and education   4. Psychosocial:  Emotional support offered to family, questions and concerns addressed   Patient Documents Completed or Given: Document Given Completed  Advanced Directives Pkt X   MOST X   DNR    Gone from My Sight    Hard Choices X     Brief HPI:  The patient is a 69 y.o. year-old AAF with Baseline CKD 3 (creatinine as recently as 04/2014 1.8 though has had episodes of AKI on CKD in the past, related to chemotherapy, diarrhea and volume depletion - followed by Dr. Florene Glen but not seen since 05/2012), with additional background history of HTN, SCCA of the lung (treated in the past with chemotherapy, and now felt to have a possible recurrence based on CT scan findings), atrial fibrillation.   Admitted to the hospital with encephalopathy (AMS/confusion/forgetfulness), profound anemia (Hb 6.9 requiring transfusion), flank pain/pyuria (treated for UTI), possible LLL infiltrate, and new systolic heart failure with EF of 20-25% by ECHO (55% in 2013) and recurrent AF with RVR. Metabolic acidosis on admission with CO2 15-18 and no gap which appears to be chronic though not on bicarbonate on admission (treated with a bicarb drip, then oral bicarb - now off supplements). Marland Kitchen  Her creatinine on admission was 2, close to her baseline, but has varied over this admission. Over the past 3 days it has increased from 2.37 to 2.72 to 3.01    Lung cancer  DIAGNOSIS:  1) stage IIIa non-small cell lung cancer, squamous cell carcinoma diagnosed on 11/11/2010 involving the right upper lobe.  PRIOR THERAPY:  10  Status post a course of concurrent chemoradiation with weekly carboplatin and Taxotere under the care of Dr. Ralene Ok and Dr. Tammi Klippel completed 03/15/2011. 2) Status post consolidation chemotherapy with carboplatin and gemcitabine for a total of 4 cycles completed 07/14/2011.  CT 1- 10-16  Large sot tissue mass in LM mediastinum likely enlarged lymph node concerning for disease progression  ROS: unable to illicit due to decreased cognition   PMH:  Past Medical History  Diagnosis Date  . Hypertension   . Anemia   . Atrial fibrillation   . Chronic kidney disease   . lung ca     lung ca  . Lung cancer 11/11/10     PSH: Past Surgical History  Procedure Laterality Date  . Port a cath placement     I have reviewed the [place] and SH and  If appropriate update it with new information. Allergies  Allergen Reactions  . Lisinopril Swelling    Makes face swell   Scheduled Meds: . antiseptic oral rinse  7 mL Mouth Rinse BID  . aspirin  81 mg Oral Daily  . feeding supplement (ENSURE COMPLETE)  237 mL Oral BID BM  . feeding supplement (RESOURCE BREEZE)  1 Container Oral TID BM  . folic acid  1 mg Oral Daily  . QUEtiapine  12.5 mg Oral BID  . sodium bicarbonate  650 mg Oral BID  . sodium chloride  3 mL Intravenous Q12H  . thiamine  100 mg Oral Daily   Continuous Infusions: . sodium chloride 75 mL/hr at 08/28/14 1012   PRN Meds:.RESOURCE THICKENUP CLEAR, sodium chloride    BP 112/63 mmHg  Pulse 86  Temp(Src) 97.7 F (36.5 C) (Axillary)  Resp 20  Ht 5\' 6"  (1.676 m)  Wt 53.978 kg (119 lb)  BMI 19.22 kg/m2  SpO2 97%   PPS:20 % at best   Intake/Output Summary (Last 24 hours) at 08/28/14 1033 Last data filed at 08/28/14 0700  Gross per 24 hour  Intake    120 ml  Output    200 ml  Net    -80 ml   Physical Exam:  General: chronically ill appearing, non verbal, unable to follow commands HEENT:  Moist buccal membranes, pocketing saliva Chest:  decreased in bases CVS:  RRR Abdomen: soft NT +BS   Labs: CBC    Component Value Date/Time   WBC 9.8 08/26/2014 0351   WBC 10.7* 08/13/2014 0855   RBC 3.12* 08/26/2014 0351   RBC 2.79* 08/13/2014 0855   RBC 2.56* 04/24/2012 1035   HGB 9.0* 08/26/2014 0351   HGB 8.5* 08/13/2014 0855   HCT 27.3* 08/26/2014 0351   HCT 22.8* 08/17/2014 1025   HCT 27.2* 08/13/2014 0855   PLT 64* 08/26/2014 0351   PLT 186 08/13/2014 0855   MCV 87.5 08/26/2014 0351   MCV 97.6 08/13/2014 0855   MCH 28.8 08/26/2014 0351   MCH 30.5 08/13/2014 0855   MCHC 33.0 08/26/2014 0351   MCHC 31.3* 08/13/2014 0855   RDW 19.6* 08/26/2014 0351   RDW 19.0* 08/13/2014 0855   LYMPHSABS 0.6* 08/16/2014 1607   LYMPHSABS 0.8* 08/13/2014 1093  MONOABS 0.4 08/16/2014 1607   MONOABS 0.8 08/13/2014 0855   EOSABS 0.0 08/16/2014 1607   EOSABS 0.0 08/13/2014 0855   BASOSABS 0.0 08/16/2014 1607   BASOSABS 0.0 08/13/2014 0855    BMET    Component Value Date/Time   NA 140 08/28/2014 0315   NA 141 08/13/2014 0855   K 3.4* 08/28/2014 0315   K 2.9* 08/13/2014 0855   CL 108 08/28/2014 0315   CL 108* 11/26/2012 1025   CO2 21 08/28/2014 0315   CO2 18* 08/13/2014 0855   GLUCOSE 87 08/28/2014 0315   GLUCOSE 113 08/13/2014 0855   GLUCOSE 72 11/26/2012 1025   BUN 53* 08/28/2014 0315   BUN 31.4* 08/13/2014 0855   CREATININE 3.38* 08/28/2014 0315   CREATININE 1.8* 08/13/2014 0855   CREATININE 1.55* 01/27/2012 0921   CALCIUM 8.6 08/28/2014 0315   CALCIUM 9.1 08/13/2014 0855   GFRNONAA 13* 08/28/2014 0315   GFRAA 15* 08/28/2014 0315    CMP     Component Value Date/Time   NA 140 08/28/2014 0315   NA 141 08/13/2014 0855   K 3.4* 08/28/2014 0315   K 2.9* 08/13/2014 0855   CL 108 08/28/2014 0315   CL 108* 11/26/2012 1025   CO2 21 08/28/2014 0315   CO2 18* 08/13/2014 0855   GLUCOSE 87 08/28/2014 0315   GLUCOSE 113 08/13/2014 0855   GLUCOSE 72 11/26/2012 1025   BUN 53* 08/28/2014 0315   BUN 31.4* 08/13/2014 0855   CREATININE 3.38*  08/28/2014 0315   CREATININE 1.8* 08/13/2014 0855   CREATININE 1.55* 01/27/2012 0921   CALCIUM 8.6 08/28/2014 0315   CALCIUM 9.1 08/13/2014 0855   PROT 6.8 08/16/2014 1607   PROT 6.8 08/13/2014 0855   ALBUMIN 2.0* 08/28/2014 0315   ALBUMIN 2.6* 08/13/2014 0855   AST 16 08/16/2014 1607   AST 12 08/13/2014 0855   ALT 13 08/16/2014 1607   ALT 10 08/13/2014 0855   ALKPHOS 64 08/16/2014 1607   ALKPHOS 71 08/13/2014 0855   BILITOT 1.5* 08/16/2014 1607   BILITOT 0.95 08/13/2014 0855   GFRNONAA 13* 08/28/2014 0315   GFRAA 15* 08/28/2014 0315     Time In Time Out Total Time Spent with Patient Total Overall Time  1300 1430 75 min 90 min    Greater than 50%  of this time was spent counseling and coordinating care related to the above assessment and plan.   Wadie Lessen NP  Palliative Medicine Team Team Phone # [phone] Pager [phone]  Paged Dr Eliseo Squires

## 2014-08-28 NOTE — Progress Notes (Addendum)
Physical Therapy Treatment Patient Details Name: Madison [name] MRN: [id number] DOB: 15-Oct-1945 Today's Date: 08/28/2014    History of Present Illness Pt admit with AMS.  Afib.    PT Comments    Pt admitted with above diagnosis. Pt currently with functional limitations due to balance and endurance deficits. Pt continues to vary in mobility from day to day. Sitting balance better today than from last session. Met 0/4 goals originally set due to setbacks with diarrhea and confusion.  Goals revised.   NHP still correct recommendation for pt.  Pt will benefit from skilled PT to increase their independence and safety with mobility to allow discharge to the venue listed below.    Follow Up Recommendations  SNF;Supervision/Assistance - 24 hour     Equipment Recommendations  Wheelchair (measurements PT)    Recommendations for Other Services       Precautions / Restrictions Precautions Precautions: Fall Restrictions Weight Bearing Restrictions: No    Mobility  Bed Mobility Overal bed mobility: Needs Assistance;+2 for physical assistance Bed Mobility: Rolling;Sidelying to Sit;Sit to Supine Rolling: Max assist Sidelying to sit: Max assist;+2 for physical assistance   Sit to supine: Max assist;+2 for physical assistance   General bed mobility comments: Verbal and tactile cues for technique.  Hand-over-hand assist with RUE to use rail to roll.  Assist to bring LE's off of bed and to bring trunk to sitting position.  Patient required mod assist initially to maintain sitting balance but progressed to min guard assist with UE support.  .  Patient sat EOB x 10 minutes.  Returned to supine with +2 max assist.  Transfers                    Ambulation/Gait                 Stairs            Wheelchair Mobility    Modified Rankin (Stroke Patients Only)       Balance Overall balance assessment: Needs assistance;History of Falls Sitting-balance support: Bilateral  upper extremity supported;Feet supported Sitting balance-Leahy Scale: Poor Sitting balance - Comments: Patient sat EOB x10 minutes with mod to min guard  assist for balance.  Patient with very flexed posture in sitting with head downward.  Cues to try to look up and extend trunk in sitting.   Postural control: Posterior lean;Right lateral lean                          Cognition Arousal/Alertness: Lethargic Behavior During Therapy: Flat affect Overall Cognitive Status: No family/caregiver present to determine baseline cognitive functioning       Memory: Decreased short-term memory              Exercises General Exercises - Lower Extremity Ankle Circles/Pumps: AROM;Both;10 reps;Seated Long Arc Quad: AROM;10 reps;Seated;Both Hip Flexion/Marching: AROM;Both;10 reps;Seated    General Comments        Pertinent Vitals/Pain Pain Assessment: No/denies pain  VSS  Home Living                      Prior Function            PT Goals (current goals can now be found in the care plan section) Acute Rehab PT Goals PT Goal Formulation: With patient Time For Goal Achievement: 10/02/14 Potential to Achieve Goals: Good Progress towards PT goals: Progressing toward goals    Frequency  Min 2X/week    PT Plan Current plan remains appropriate    Co-evaluation             End of Session Equipment Utilized During Treatment: Gait belt Activity Tolerance: Patient limited by lethargy;Patient limited by fatigue Patient left: in bed;with call bell/phone within reach     Time: 1009-1031 PT Time Calculation (min) (ACUTE ONLY): 22 min  Charges:  $Therapeutic Activity: 8-22 mins                    G CodesIrwin Brakeman F September 19, 2014, 11:56 AM Amanda [facility] [phone] (pager)

## 2014-08-28 NOTE — Progress Notes (Signed)
VASCULAR LAB PRELIMINARY  PRELIMINARY  PRELIMINARY  PRELIMINARY  Right upper extremity venous duplex completed.    Preliminary report:  Right:  No obvious evidence of DVT or superficial thrombosis.    Jamone Garrido, [place], RVS 08/28/2014, 1:04 PM

## 2014-08-28 NOTE — Progress Notes (Signed)
[facility] Rounding Note Subjective:  More awake Says she feels better - more communicative BP's improved some (at least all systolics are over 342 right now) Minimal UOP but at least some (200/24 hours) Getting IVF at 50 Off BB  Objective Vital signs in last 24 hours: Filed Vitals:   08/27/14 2045 08/28/14 0011 08/28/14 0400 08/28/14 0816  BP: 113/57 105/85 112/63   Pulse: 77 88 86   Temp: 97.9 F (36.6 C) 97.5 F (36.4 C) 97.7 F (36.5 C) 97.7 F (36.5 C)  TempSrc: Axillary Axillary Axillary Axillary  Resp: 20 24 20    Height:      Weight:      SpO2: 99% 96% 97%    Weight change:   Intake/Output Summary (Last 24 hours) at 08/28/14 0817 Last data filed at 08/28/14 0700  Gross per 24 hour  Intake    120 ml  Output    200 ml  Net    -80 ml   Physical Exam:  BP 112/63 mmHg  Pulse 86  Temp(Src) 97.7 F (36.5 C) (Axillary)  Resp 20  Ht 5\' 6"  (1.676 m)  Wt 53.978 kg (119 lb)  BMI 19.22 kg/m2  SpO2 97% Gen: Frail thin AAF  Much more awake today Has right sided portacath - accessed Has worsening right arm edema Skin: no rash, cyanosis Neck: + JVD but lying flat Chest: Grossly clear though effort not great Heart: Regular this AM  S1S2 No S3 No audible murmur.  Abdomen: soft, + BS Ext: No edema whatsoever of the lower extremities. Pitting edema of the right arm.   Recent Labs Lab 08/22/14 0455 08/23/14 0500 08/24/14 0415 08/25/14 0522 08/26/14 0351 08/27/14 0450 08/28/14 0315  NA 142 142 140 139 142 142 140  K 3.3* 3.8 3.6 3.7 3.8 3.5 3.4*  CL 103 103 103 106 105 110 108  CO2 28 26 25 28 23 24 21   GLUCOSE 97 121* 91 96 94 101* 87  BUN 30* 34* 35* 39* 46* 51* 53*  CREATININE 2.60* 2.48* 2.37* 2.72* 3.01* 3.38* 3.38*  CALCIUM 7.8* 8.0* 8.3* 8.8 8.8 8.8 8.6  PHOS  --   --   --   --   --  4.7* 4.5    Recent Labs Lab 08/27/14 0450 08/28/14 0315  ALBUMIN 1.8* 2.0*    Recent Labs Lab 08/26/14 1430  AMMONIA 18   CBC:  Recent  Labs Lab 08/23/14 0500 08/24/14 0415 08/25/14 0522 08/26/14 0351  WBC 12.4* 13.3* 13.3* 9.8  HGB 10.1* 10.5* 9.7* 9.0*  HCT 28.9* 30.5* 28.6* 27.3*  MCV 84.5 86.9 85.4 87.5  PLT 56* 64* 62* 64*    Recent Labs Lab 08/26/14 1352  GLUCAP 93   Results for Madison, [name] (MRN [id number]) as of 08/27/2014 08:24  Ref. Range 08/26/2014 16:35  Sodium, Ur Latest Units: mmol/L 10  Creatinine, Urine Latest Units: mg/dL 175.51  Calculated fractional sodium excretion 0.14%   Iron Studies: No results for input(s): IRON, TIBC, TRANSFERRIN, FERRITIN in the last 168 hours. Studies/Results: Dg Chest 2 View  08/26/2014   CLINICAL DATA:  Acute encephalopathy.  EXAM: CHEST  2 VIEW  COMPARISON:  08/20/2014  FINDINGS: Right jugular Port-A-Cath remains in place with tip new the cavoatrial junction. Cardiac silhouette appears mildly enlarged. The patient has taken a shallower inspiration than on the prior study and there are increased hazy and patchy opacities in both lung bases. No overt pulmonary edema or pneumothorax is identified. There are small  bilateral pleural effusions which may have increased in size from the prior study although some of the differences could be due to differences in patient positioning. No acute osseous abnormality is seen.  IMPRESSION: Shallow inspiration with small bilateral pleural effusions and increased bibasilar lung opacities which may reflect atelectasis versus infectious infiltrate.   Electronically Signed   By: Logan Bores   On: 08/26/2014 16:56   US Renal  08/26/2014   CLINICAL DATA:  Acute renal injury; hypertension  EXAM: RENAL/URINARY TRACT ULTRASOUND COMPLETE  COMPARISON:  CT abdomen and pelvis August 16, 2014 ; renal ultrasound April 19, 2012  FINDINGS: Right Kidney:  Length: 8.7 cm. Echogenicity is increased. The renal cortical thickness is within normal limits. No mass or hydronephrosis visualized. There is a small amount of perinephric fluid on the right. There  is no sonographically demonstrable calculus or ureterectasis.  Left Kidney:  Length: 9.3 cm. Echogenicity is increased. Renal cortical thickness is within normal limits. No mass, perinephric fluid, or hydronephrosis visualized. No sonographically demonstrable calculus or ureterectasis.  Bladder:  Appears normal for degree of bladder distention.  Incidental note is made of a left pleural effusion.  IMPRESSION: Kidneys are rather small and echogenic consistent with medical renal disease. There is no hydronephrosis on either side. A small amount of perinephric fluid on the right is of uncertain etiology. There is a left pleural effusion.   Electronically Signed   By: Lowella Grip M.D.   On: 08/26/2014 15:21   Medications: . sodium chloride 50 mL/hr at 08/27/14 1314   . antiseptic oral rinse  7 mL Mouth Rinse BID  . aspirin  81 mg Oral Daily  . feeding supplement (ENSURE COMPLETE)  237 mL Oral BID BM  . feeding supplement (RESOURCE BREEZE)  1 Container Oral TID BM  . folic acid  1 mg Oral Daily  . QUEtiapine  12.5 mg Oral BID  . sodium chloride  3 mL Intravenous Q12H  . thiamine  100 mg Oral Daily   Background: 69 yo AAF with CKD3 (baseline creatinine 1.8-2 Sept 2015) admitted with encephalopathy, ?UTI (pyuria and bacteruria without predominant organism on culture), AF with RVR, new cardiomyopathy with EF 20-25% by ECHO (previously around 55%), possible lung cancer recurrent with mediastinal and gastrohepatic ligament adenopathy on CT scan, who developed AKI on CKD with creatinine up to 3, with oliguria, at time of consultation 1/19. Peak 3.38.  Impression/Plan  1. AKI on CKD3 - Small echodense kidneys. UA on admission without proteinuria. Prior CKD attributed to nephrosclerosis. Looks clinically dry (although some small effusions on CXR). Has new systolic dysfunction with low EF and has had low BP's with likely cardiorenal component. Pre-renal/hypoperfusion state supported by low fractional  sodium excretion.  This is superimposed on CKD 3 with bilaterally small kidneys and no renal reserve. Creatinine may be stabilizing - same today as yesterday. Would favor continuing slow fluids for now. Pt is off BB now and BP has improved some.  UOP very marginal but at least she has some now... Midodrine was not require for BP so far.  Fine line here between effective intravascular volume depletion and CHF. I hope she will continue to recover some kidney function.   2. Encephalopathy - improved today - best I have seen her.   3. Cardiomyopathy with severe systolic dysfunction, EF 10%- cards following 4. AF with RVR - rate controlled. Noww is in NSR. Carvedilol has been stopped for BP reasons. 5. 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  6. Anemia - profound on admission. Post transfusion. No iron deficiency. Has trended down some past few days.  7. Thrombocytopenia - persistent despite treatment of ?urine/pulm infection  8. Metabolic acidosis - treated earlier in admission. Bicarb supplements were stopped. CO2 drifting down. Restart oral sodium bicarbonate. 9. Right upper extremity edema - on same side as portacath. Maybe central stenosis but should imaging be done to r/o DVT? I have ordered doppler of right arm   Jamal Maes, MD [facility] [phone] pager 08/28/2014, 8:17 AM

## 2014-08-28 NOTE — Progress Notes (Signed)
Meds crushed in pudding, required much coaxing and encouragement to swallow meds. Pt conversing with Probation officer and looked as if all was clear in mouth, however, about 1hr. Later pt spit lg amt of saliva with pudding  and meds. MD made aware.

## 2014-08-29 DIAGNOSIS — R1314 Dysphagia, pharyngoesophageal phase: Secondary | ICD-10-CM

## 2014-08-29 DIAGNOSIS — C3430 Malignant neoplasm of lower lobe, unspecified bronchus or lung: Secondary | ICD-10-CM

## 2014-08-29 DIAGNOSIS — Z66 Do not resuscitate: Secondary | ICD-10-CM

## 2014-08-29 DIAGNOSIS — R531 Weakness: Secondary | ICD-10-CM

## 2014-08-29 LAB — RENAL FUNCTION PANEL
ALBUMIN: 2 g/dL — AB (ref 3.5–5.2)
Anion gap: 15 (ref 5–15)
BUN: 53 mg/dL — ABNORMAL HIGH (ref 6–23)
CO2: 19 mmol/L (ref 19–32)
CREATININE: 3.29 mg/dL — AB (ref 0.50–1.10)
Calcium: 8.8 mg/dL (ref 8.4–10.5)
Chloride: 109 mEq/L (ref 96–112)
GFR, EST AFRICAN AMERICAN: 15 mL/min — AB (ref >90)
GFR, EST NON AFRICAN AMERICAN: 13 mL/min — AB (ref >90)
Glucose, Bld: 78 mg/dL (ref 70–99)
PHOSPHORUS: 4.5 mg/dL (ref 2.3–4.6)
Potassium: 3.3 mmol/L — ABNORMAL LOW (ref 3.5–5.1)
SODIUM: 143 mmol/L (ref 135–145)

## 2014-08-29 LAB — VITAMIN B1

## 2014-08-29 MED ORDER — POTASSIUM CHLORIDE 10 MEQ/100ML IV SOLN
10.0000 meq | INTRAVENOUS | Status: AC
  Administered 2014-08-29 (×2): 10 meq via INTRAVENOUS
  Filled 2014-08-29 (×3): qty 100

## 2014-08-29 MED ORDER — SODIUM BICARBONATE 650 MG PO TABS: 650.0000 mg | ORAL_TABLET | Freq: Three times a day (TID) | ORAL | Status: DC

## 2014-08-29 NOTE — Progress Notes (Addendum)
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 Patient had increased difficulty removing remaining appointments, history of loose stools, flank pain and weight loss. On admission she was found to be hyperkalemic with  UTI and sinus tachycardia in the ED. CT scan of the abdomen and pelvis showed necrotic lymph node suspicious for recurrence of her prior lung cancer. She was admitted for further management. Her hemoglobin dropped to 6.9 on the second day of admission. She was transfused with 2 units PRBC. She was also given IV fluids. Hospital course complicated due to development of lung infiltrate, new-onset Afib with RVR, hypotension and new onset acute systolic CHF.    Assessment/Plan: Sepsis  Patient was  hypotensive and hypothermic with rapid A. Fib at admission.   Sepsis could be related to UTI versus pneumonia ( ?Aspiration) associated with acute encephalopathy. - Blood cx negative.narrowed abx to levaquin and dced  (received >7 days of abx)  Acute encephalopathy Thought to be related to metabolic acidosis and sepsis. However remains persistent. TSH, B12 and ammonia level normal. Pending RPR.  HIV ab negative. ordered thiamine level. Given  IV thiamine for 3 days (until 1/19).  - More lethargic on 1/19- appears to be dehydration as patient has improved -MRI brain negative for stroke or metastases.  -wean down seroquel  New-onset acute systolic CHF EF of 23% with global hypokinesia. Per cardiology differential is broad secondary to  multivessel coronary disease versus diabetic cardiomyopathy versus takatsubo cardiomyopathy. Not a good candidate for cardiac  catheterization or bypass surgery given her possible lung cancer recurrence , advanced chronic kidney disease, anemia and sepsis. - Not on ACE inhibitor, Lasix or Aldactone due to borderline blood pressure and AKI. -holding coreg due to hypotension   New-onset A. fib with RVR - Her CHADS VASC score is 4 . Was on coumadin in 2012 for afib which was dced as she did not have any recurrence. Given anemia and recurrence of lung ca, cardiology feel she is not a candidate for long term anticoagulation.   Thrombocytopenia Likely secondary to sepsis and medications . Progressive drop in plt noted. Discontinued heparin products and place SCDs. She was on vancomycin for sepsis and has been discontinued.  HIT ab negative. Seen by hematology and recommended to monitor for now.  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 Non anion gap acidosis resolved. Renal function worsening  hypokalemia Replenishing with KCl.   Elevated troponin Possibly demand ischemia with underlying acute CHF and rapid A. Fib. Continue aspirin.  Hypotension Required periodic IV fluid bolus. meds for HTN held  Left lower lobe pneumonia with small pleural effusion Continue O2 via nasal cannula. Transitioned to  oral levaquin and treated  History of non-small cell lung cancer status post chemotherapy (completed in 2012), with CT evidence of recurrence. CT chest shows large soft tissue mass in the lower middle mediastinum representing an enlarged lymph node . Not in acute distress. Hospitalist discussed with patient's oncologist Dr. Julien Nordmann and recommended outpatient  follow-up.  Anemia of  chronic disease Hemoglobin stable after 2 units PRBC transfused.  SLP eval- DYS diet  Await palliative care consult  Code Status:full code  Family Communication: spoke with son  1/20, called 1/22- NA  Disposition Plan:  pending   Consultants:  PCCM  Cardiology  Procedures:  2-D  echo  MRI brain  Antibiotics:  IV vancomycin and cefepime 1/8-1/12  Levaquin 1/12-1/16    HPI/Subjective: Confused but more awake -began to pocket food last PM     Objective: Filed Vitals:   08/29/14 0741  BP: 116/80  Pulse: 88  Temp: 97.6 F (36.4 C)  Resp: 21    Intake/Output Summary (Last 24 hours) at 08/29/14 0857 Last data filed at 08/29/14 0842  Gross per 24 hour  Intake    100 ml  Output    200 ml  Net   -100 ml   Filed Weights   08/22/14 0358 08/26/14 0400 08/27/14 0549  Weight: 54.16 kg (119 lb 6.4 oz) 51.982 kg (114 lb 9.6 oz) 53.978 kg (119 lb)    Exam:   General:  awake  Chest: Clear breath sounds bilaterally  CVS: S1 and S2  irregular, no murmurs, rubs or gallop  Abdomen: Soft, nondistended, nontender, bowel sounds present  Extremities: Warm, no edema, no rash or purpura   Data Reviewed: Basic Metabolic Panel:  Recent Labs Lab 08/25/14 0522 08/26/14 0351 08/27/14 0450 08/28/14 0315 08/29/14 0350  NA 139 142 142 140 143  K 3.7 3.8 3.5 3.4* 3.3*  CL 106 105 110 108 109  CO2 28 23 24 21 19   GLUCOSE 96 94 101* 87 78  BUN 39* 46* 51* 53* 53*  CREATININE 2.72* 3.01* 3.38* 3.38* 3.29*  CALCIUM 8.8 8.8 8.8 8.6 8.8  PHOS  --   --  4.7* 4.5 4.5   Liver Function Tests:  Recent Labs Lab 08/27/14 0450 08/28/14 0315 08/29/14 0350  ALBUMIN 1.8* 2.0* 2.0*   No results for input(s): LIPASE, AMYLASE in the last 168 hours.  Recent Labs Lab 08/26/14 1430  AMMONIA 18   CBC:  Recent Labs Lab 08/23/14 0500 08/24/14 0415 08/25/14 0522 08/26/14 0351  WBC 12.4* 13.3* 13.3* 9.8  HGB 10.1* 10.5* 9.7* 9.0*  HCT 28.9* 30.5* 28.6* 27.3*  MCV 84.5 86.9 85.4 87.5  PLT 56* 64* 62* 64*   Cardiac Enzymes: No results for input(s): CKTOTAL, CKMB, CKMBINDEX, TROPONINI in the last 168 hours. BNP (last 3 results) No results for input(s): PROBNP in the last 8760 hours. CBG:  Recent Labs Lab 08/26/14 1352  GLUCAP 93    Recent  Results (from the past 240 hour(s))  Culture, blood (routine x 2)     Status: None   Collection Time: 08/19/14 10:58 AM  Result Value Ref Range Status   Specimen Description BLOOD LEFT WRIST  Final   Special Requests BOTTLES DRAWN AEROBIC ONLY 5CC  Final   Culture   Final    NO GROWTH 5 DAYS Performed at [facility]    Report Status 08/25/2014 FINAL  Final  Culture, blood (routine x 2)     Status: None   Collection Time: 08/19/14  3:20 PM  Result Value Ref Range Status   Specimen Description BLOOD RIGHT HAND  Final   Special Requests BOTTLES DRAWN AEROBIC ONLY 0.5CC  Final   Culture   Final    NO GROWTH 5 DAYS Performed at [facility]    Report Status 08/25/2014 FINAL  Final  MRSA PCR Screening     Status: None   Collection Time: 08/19/14  5:36 PM  Result Value Ref Range Status   MRSA by PCR NEGATIVE NEGATIVE Final    Comment:  The GeneXpert MRSA Assay (FDA approved for NASAL specimens only), is one component of a comprehensive MRSA colonization surveillance program. It is not intended to diagnose MRSA infection nor to guide or monitor treatment for MRSA infections.   Clostridium Difficile by PCR     Status: None   Collection Time: 08/22/14  3:11 PM  Result Value Ref Range Status   C difficile by pcr NEGATIVE NEGATIVE Final     Studies: No results found.  Scheduled Meds: . antiseptic oral rinse  7 mL Mouth Rinse BID  . aspirin  81 mg Oral Daily  . feeding supplement (ENSURE COMPLETE)  237 mL Oral BID BM  . feeding supplement (RESOURCE BREEZE)  1 Container Oral TID BM  . folic acid  1 mg Oral Daily  . QUEtiapine  12.5 mg Oral BID  . sodium bicarbonate  650 mg Oral BID  . sodium chloride  3 mL Intravenous Q12H  . thiamine  100 mg Oral Daily   Continuous Infusions: . sodium chloride 75 mL/hr at 08/28/14 1012      Time spent: 25 minutes    Eulogio Bear  [facility] Pager [phone] If 7PM-7AM, please contact night-coverage  at [email or web address], password [facility] 08/29/2014, 8:57 AM  LOS: 13 days

## 2014-08-29 NOTE — Progress Notes (Signed)
[facility] Rounding Note Subjective:  Pt awake Conversant, but oriented only to place, not to day, date, year Not eating - pocketing food Still getting IVF Making a little urine but not much Right arm duplex no DVT  Objective Vital signs in last 24 hours: Filed Vitals:   08/28/14 2000 08/28/14 2344 08/29/14 0400 08/29/14 0741  BP: 117/70 124/65 118/70 116/80  Pulse: 83 85 87 88  Temp: 98.4 F (36.9 C) 97.7 F (36.5 C) 97.9 F (36.6 C) 97.6 F (36.4 C)  TempSrc: Oral Axillary Oral Axillary  Resp:  20 20 21   Height:      Weight:      SpO2: 100% 94% 96% 95%   Weight change:   Intake/Output Summary (Last 24 hours) at 08/29/14 1155 Last data filed at 08/29/14 0842  Gross per 24 hour  Intake     50 ml  Output    150 ml  Net   -100 ml   Physical Exam:  BP 116/80 mmHg  Pulse 88  Temp(Src) 97.6 F (36.4 C) (Axillary)  Resp 21  Ht 5\' 6"  (1.676 m)  Wt 53.978 kg (119 lb)  BMI 19.22 kg/m2  SpO2 95% Gen: Frail thin AAF  More awake but not oriented Has right sided portacath - accessed Right arm edema unchanged Skin: no rash, cyanosis Neck: + JVD but lying flat Chest: Grossly clear  Heart: Regular   S1S2 No S3 No audible murmur.  Abdomen: soft, + BS Ext: No edema whatsoever of the lower extremities.  Pitting edema of the right arm.   Recent Labs Lab 08/23/14 0500 08/24/14 0415 08/25/14 0522 08/26/14 0351 08/27/14 0450 08/28/14 0315 08/29/14 0350  NA 142 140 139 142 142 140 143  K 3.8 3.6 3.7 3.8 3.5 3.4* 3.3*  CL 103 103 106 105 110 108 109  CO2 26 25 28 23 24 21 19   GLUCOSE 121* 91 96 94 101* 87 78  BUN 34* 35* 39* 46* 51* 53* 53*  CREATININE 2.48* 2.37* 2.72* 3.01* 3.38* 3.38* 3.29*  CALCIUM 8.0* 8.3* 8.8 8.8 8.8 8.6 8.8  PHOS  --   --   --   --  4.7* 4.5 4.5    Recent Labs Lab 08/27/14 0450 08/28/14 0315 08/29/14 0350  ALBUMIN 1.8* 2.0* 2.0*    Recent Labs Lab 08/26/14 1430  AMMONIA 18    Recent Labs Lab 08/23/14 0500  08/24/14 0415 08/25/14 0522 08/26/14 0351  WBC 12.4* 13.3* 13.3* 9.8  HGB 10.1* 10.5* 9.7* 9.0*  HCT 28.9* 30.5* 28.6* 27.3*  MCV 84.5 86.9 85.4 87.5  PLT 56* 64* 62* 64*    Recent Labs Lab 08/26/14 1352  GLUCAP 93   Results for Madison, [name] (MRN [id number]) as of 08/27/2014 08:24  Ref. Range 08/26/2014 16:35  Sodium, Ur Latest Units: mmol/L 10  Creatinine, Urine Latest Units: mg/dL 175.51  Calculated fractional sodium excretion 0.14%   Medications: . sodium chloride 75 mL/hr at 08/28/14 1012   . antiseptic oral rinse  7 mL Mouth Rinse BID  . aspirin  81 mg Oral Daily  . feeding supplement (ENSURE COMPLETE)  237 mL Oral BID BM  . feeding supplement (RESOURCE BREEZE)  1 Container Oral TID BM  . folic acid  1 mg Oral Daily  . potassium chloride  10 mEq Intravenous Q1 Hr x 2  . QUEtiapine  12.5 mg Oral BID  . sodium bicarbonate  650 mg Oral BID  . sodium chloride  3 mL  Intravenous Q12H  . thiamine  100 mg Oral Daily   Background: 69 yo AAF with CKD3 (baseline creatinine 1.8-2 Sept 2015) admitted with encephalopathy, ?UTI (pyuria and bacteruria without predominant organism on culture), AF with RVR, new cardiomyopathy with EF 20-25% by ECHO (previously around 55%), possible lung cancer recurrent with mediastinal and gastrohepatic ligament adenopathy on CT scan, who developed AKI on CKD with creatinine up to 3, with oliguria, at time of consultation 1/19. Peak 3.38.  Impression/Plan  1. AKI on CKD3 - Small echodense kidneys. UA on admission without proteinuria. Prior CKD attributed to nephrosclerosis. Looks clinically dry (although some small effusions on CXR). Has new systolic dysfunction with low EF and has had low BP's with likely cardiorenal component. Pre-renal/hypoperfusion state supported by low fractional sodium excretion.  This is superimposed on CKD 3 with bilaterally small kidneys and no renal reserve. Creatinine appears to have reached a plateau. Making some urine but  not much. BP a little better. Taking in hardly anything po so needs to say on IVF. I don't know how much kidney function she is going to recover. With her severe systolic heart failure, low blood pressures and recurrent lung cancer would not be considered a candidate for long term dialysis.  I hope she will continue to recover some kidney function. I 2. Metabolic acidosis - increase sodium bicarb to TID  3. Encephalopathy - improved alertness but not orientation   4. Cardiomyopathy with severe systolic dysfunction, EF 87% 5. AF with RVR - rate controlled. In NSR now. Carvedilol has been stopped for BP reasons. 6. 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  7. Anemia - profound on admission. Post transfusion. No iron deficiency. Has trended down some past few days.  Last checked on 1/19. Repeat CBC 8. Thrombocytopenia - persistent despite treatment of ?urine/pulm infection  9. Metabolic acidosis - treated earlier in admission. Bicarb supplements were stopped. CO2 drifting down. Restart oral sodium bicarbonate. 10. Right upper extremity edema - on same side as portacath. No DVT on duplex 11. Disposition - very appropriately, Palliative Care is seeing.  Jamal Maes, MD [facility] [phone] pager 08/29/2014, 11:55 AM

## 2014-08-29 NOTE — Clinical Social Work Note (Signed)
CSW reviewed chart once the pt is medically stable she can transition to [facility]. CSW will contiune to follow and assist with discharge.     [place], MSW, [place]

## 2014-08-30 ENCOUNTER — Inpatient Hospital Stay (HOSPITAL_COMMUNITY): Payer: Medicare Other

## 2014-08-30 DIAGNOSIS — R1314 Dysphagia, pharyngoesophageal phase: Secondary | ICD-10-CM

## 2014-08-30 LAB — CBC
HEMATOCRIT: 22.3 % — AB (ref 36.0–46.0)
Hemoglobin: 7.6 g/dL — ABNORMAL LOW (ref 12.0–15.0)
MCH: 28.9 pg (ref 26.0–34.0)
MCHC: 34.1 g/dL (ref 30.0–36.0)
MCV: 84.8 fL (ref 78.0–100.0)
Platelets: 89 10*3/uL — ABNORMAL LOW (ref 150–400)
RBC: 2.63 MIL/uL — AB (ref 3.87–5.11)
RDW: 20.6 % — ABNORMAL HIGH (ref 11.5–15.5)
WBC: 11.1 10*3/uL — ABNORMAL HIGH (ref 4.0–10.5)

## 2014-08-30 LAB — RENAL FUNCTION PANEL
Albumin: 2 g/dL — ABNORMAL LOW (ref 3.5–5.2)
Anion gap: 14 (ref 5–15)
BUN: 52 mg/dL — ABNORMAL HIGH (ref 6–23)
CO2: 18 mmol/L — AB (ref 19–32)
CREATININE: 3.07 mg/dL — AB (ref 0.50–1.10)
Calcium: 8.4 mg/dL (ref 8.4–10.5)
Chloride: 108 mmol/L (ref 96–112)
GFR calc Af Amer: 17 mL/min — ABNORMAL LOW (ref >90)
GFR, EST NON AFRICAN AMERICAN: 14 mL/min — AB (ref >90)
Glucose, Bld: 73 mg/dL (ref 70–99)
PHOSPHORUS: 4.4 mg/dL (ref 2.3–4.6)
Potassium: 3.4 mmol/L — ABNORMAL LOW (ref 3.5–5.1)
Sodium: 140 mmol/L (ref 135–145)

## 2014-08-30 MED ORDER — POTASSIUM CHLORIDE 10 MEQ/100ML IV SOLN
10.0000 meq | INTRAVENOUS | Status: AC
  Administered 2014-08-30 (×3): 10 meq via INTRAVENOUS
  Filled 2014-08-30 (×2): qty 100

## 2014-08-30 MED ORDER — SODIUM BICARBONATE 8.4 % IV SOLN
INTRAVENOUS | Status: DC
  Administered 2014-08-30: 13:00:00 via INTRAVENOUS
  Filled 2014-08-30 (×3): qty 1000

## 2014-08-30 NOTE — Progress Notes (Signed)
[facility] Rounding Note Subjective:  She remains confused Does not remember seeing me at all and I have seen her every day sine the 19'th Randomly started saying something about breakfast that I could not understand Making minimal amounts of urine Eating and drinking virtually nothing Biggest complaint is abdominal pain Objective Vital signs in last 24 hours: Filed Vitals:   08/29/14 2000 08/29/14 2353 08/30/14 0400 08/30/14 0830  BP: 130/75 125/78 115/66   Pulse: 83 83 83   Temp: 97.8 F (36.6 C) 97.8 F (36.6 C) 97.6 F (36.4 C) 97.4 F (36.3 C)  TempSrc: Axillary Axillary Oral Axillary  Resp: 16 22 22    Height:      Weight:      SpO2: 95%  96%    Weight change:   Intake/Output Summary (Last 24 hours) at 08/30/14 1132 Last data filed at 08/30/14 0440  Gross per 24 hour  Intake      0 ml  Output    200 ml  Net   -200 ml    Physical Exam:  BP 115/66 mmHg  Pulse 83  Temp(Src) 97.4 F (36.3 C) (Axillary)  Resp 22  Ht 5\' 6"  (1.676 m)  Wt 53.978 kg (119 lb)  BMI 19.22 kg/m2  SpO2 96% Gen: Frail thin AAF  Seems more confused today Has right sided portacath - accessed Right arm edema unchanged Skin: no rash, cyanosis Neck: + JVD but lying flat Chest: Grossly clear and no increased WOB Heart: Regular   S1S2 No S3 No audible murmur.  Abdomen: + BS but moderate LLQ tenderness to palpation Ext: No edema of the LE's or abd wall Pitting edema of the right arm is unchanged   Recent Labs Lab 08/24/14 0415 08/25/14 0522 08/26/14 0351 08/27/14 0450 08/28/14 0315 08/29/14 0350 08/30/14 0520  NA 140 139 142 142 140 143 140  K 3.6 3.7 3.8 3.5 3.4* 3.3* 3.4*  CL 103 106 105 110 108 109 108  CO2 25 28 23 24 21 19  18*  GLUCOSE 91 96 94 101* 87 78 73  BUN 35* 39* 46* 51* 53* 53* 52*  CREATININE 2.37* 2.72* 3.01* 3.38* 3.38* 3.29* 3.07*  CALCIUM 8.3* 8.8 8.8 8.8 8.6 8.8 8.4  PHOS  --   --   --  4.7* 4.5 4.5 4.4    Recent Labs Lab 08/28/14 0315  08/29/14 0350 08/30/14 0520  ALBUMIN 2.0* 2.0* 2.0*    Recent Labs Lab 08/26/14 1430  AMMONIA 18    Recent Labs Lab 08/24/14 0415 08/25/14 0522 08/26/14 0351 08/30/14 0520  WBC 13.3* 13.3* 9.8 11.1*  HGB 10.5* 9.7* 9.0* 7.6*  HCT 30.5* 28.6* 27.3* 22.3*  MCV 86.9 85.4 87.5 84.8  PLT 64* 62* 64* 89*    Recent Labs Lab 08/26/14 1352  GLUCAP 93   Results for POONAM, WOEHRLE (MRN [id number]) as of 08/27/2014 08:24  Ref. Range 08/26/2014 16:35  Sodium, Ur Latest Units: mmol/L 10  Creatinine, Urine Latest Units: mg/dL 175.51  Calculated fractional sodium excretion 0.14%   Medications: . sodium chloride 75 mL/hr at 08/28/14 1012   . antiseptic oral rinse  7 mL Mouth Rinse BID  . aspirin  81 mg Oral Daily  . feeding supplement (ENSURE COMPLETE)  237 mL Oral BID BM  . feeding supplement (RESOURCE BREEZE)  1 Container Oral TID BM  . folic acid  1 mg Oral Daily  . QUEtiapine  12.5 mg Oral BID  . sodium bicarbonate  650 mg  Oral TID  . sodium chloride  3 mL Intravenous Q12H  . thiamine  100 mg Oral Daily   Background: 69 yo AAF with CKD3 (baseline creatinine 1.8-2 Sept 2015) admitted with encephalopathy, ?UTI (pyuria and bacteruria without predominant organism on culture), AF with RVR, new cardiomyopathy with EF 20-25% by ECHO (previously around 55%), possible lung cancer recurrent with mediastinal and gastrohepatic ligament adenopathy on CT scan, who developed AKI on CKD with creatinine up to 3, with oliguria, at time of consultation 1/19. Peak 3.38.  Impression/Plan  1. AKI on CKD3 - Small echodense kidneys. UA on admission without proteinuria. Prior CKD attributed to nephrosclerosis. Looked clinically dry at time of initial consult (although some small effusions on CXR). Has new systolic dysfunction with low EF and had low BP's with likely cardiorenal component. Pre-renal/hypoperfusion state supported by low fractional sodium excretion superimposed on CKD 3 with bilaterally  small kidneys and no renal reserve. Creatinine appears to have reached a plateau and is falling some, albeit slowly. Making very little urine - about 200 ml/day.  BP has improved. Taking in hardly anything po so needs to stay on IVF but I think can slow rate back to 50/hour. With her severe systolic heart failure and recurrent lung cancer would not be considered a candidate for long term dialysis.  I hope she will continue to recover some kidney function.  2. Hypokalemia - 2 runs of K yesterday, give 3 runs today 3. Metabolic acidosis - increased sodium bicarb to TID but not swallowing pills. Will change IVF to bicarb containing and stop oral bicarb  4. Encephalopathy - More confused today. Scan neg.   5. Cardiomyopathy with severe systolic dysfunction, EF 07% 6. AF with RVR - rate controlled. In NSR now. Carvedilol was stopped for BP reasons. 7. Abdominal pain - check KUB. May need CT 8. 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  9. Anemia - profound on admission. Post transfusion. No iron deficiency. Back down to 7.6. Will likely need additional transfusion. No obvious source of blood loss 10. Thrombocytopenia - persistent despite treatment of GU and pulm infections.  11. Right upper extremity edema - on same side as portacath. No DVT on duplex 12. Disposition - Needs Palliative Care followup.  She is clearly failing to thrive.  Jamal Maes, MD [facility] [phone] pager 08/30/2014, 11:32 AM

## 2014-08-30 NOTE — Progress Notes (Signed)
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 Patient had increased difficulty removing remaining appointments, history of loose stools, flank pain and weight loss. On admission she was found to be hyperkalemic with  UTI and sinus tachycardia in the ED. CT scan of the abdomen and pelvis showed necrotic lymph node suspicious for recurrence of her prior lung cancer. She was admitted for further management. Her hemoglobin dropped to 6.9 on the second day of admission. She was transfused with 2 units PRBC. She was also given IV fluids. Hospital course complicated due to development of lung infiltrate, new-onset Afib with RVR, hypotension and new onset acute systolic CHF.  Family has been working with palliative care but has been unable to make any decisions.  Patient now not eating.  Had a long conversation with son who is MPOA that there was nothing else for [place] to offer as she is not eating and they need to consider DNR status and comfort care    Assessment/Plan: Sepsis  Patient was  hypotensive and hypothermic with rapid A. Fib at admission.   Sepsis could be related to UTI versus pneumonia ( ?Aspiration) associated with acute encephalopathy. - Blood cx negative.narrowed abx to levaquin and dced  (received >7 days of abx)  Acute encephalopathy Thought to be related to metabolic acidosis and sepsis. However remains persistent. TSH, B12 and ammonia level normal. Pending RPR.  HIV ab negative. ordered thiamine level. Given  IV thiamine for 3 days (until 1/19).  - More lethargic on 1/19- appears to be dehydration as patient has improved -MRI brain negative for stroke or  metastases.  -wean down seroquel  New-onset acute systolic CHF EF of 83% with global hypokinesia. Per cardiology differential is broad secondary to  multivessel coronary disease versus diabetic cardiomyopathy versus takatsubo cardiomyopathy. Not a good candidate for cardiac catheterization or bypass surgery given her possible lung cancer recurrence , advanced chronic kidney disease, anemia and sepsis. - Not on ACE inhibitor, Lasix or Aldactone due to borderline blood pressure and AKI. -holding coreg due to hypotension   New-onset A. fib with RVR - Her CHADS VASC score is 4 . Was on coumadin in 2012 for afib which was dced as she did not have any recurrence. Given anemia and recurrence of lung ca, cardiology feel she is not a candidate for long term anticoagulation.   Thrombocytopenia Likely secondary to sepsis and medications . Progressive drop in plt noted. Discontinued heparin products and place SCDs. She was on vancomycin for sepsis and has been discontinued.  HIT ab negative. Seen by hematology and recommended to monitor for now.  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 Non anion gap acidosis resolved. Renal function worsening  hypokalemia Replenishing with KCl.   Elevated troponin Possibly demand ischemia with underlying acute CHF and rapid A. Fib. Continue aspirin.  Hypotension Required periodic IV fluid bolus. meds for HTN held  Left lower lobe pneumonia with small pleural effusion Continue O2 via nasal cannula. Transitioned to  oral levaquin and treated  History of non-small cell lung cancer status post chemotherapy (completed in 2012), with CT evidence of recurrence. CT chest shows large soft tissue mass in the lower middle mediastinum  representing an enlarged lymph node . Not in acute distress. Hospitalist discussed with patient's oncologist Dr. Julien Nordmann and recommended outpatient  follow-up.  Anemia of  chronic disease Hemoglobin  stable after 2 units PRBC transfused.  SLP eval- DYS diet  palliative care consult   Code Status:full code  Family Communication: spoke with son 1/20, called 1/22- NA on cell phone, called [phone]- and spoke with son  Disposition Plan:  pending   Consultants:  PCCM  Cardiology  Procedures:  2-D echo  MRI brain  Antibiotics:  IV vancomycin and cefepime 1/8-1/12  Levaquin 1/12-1/16   HPI/Subjective: Awake and more interactive but also more confused     Objective: Filed Vitals:   08/30/14 0400  BP: 115/66  Pulse: 83  Temp: 97.6 F (36.4 C)  Resp: 22    Intake/Output Summary (Last 24 hours) at 08/30/14 0914 Last data filed at 08/30/14 0440  Gross per 24 hour  Intake      0 ml  Output    200 ml  Net   -200 ml   Filed Weights   08/26/14 0400 08/27/14 0549  Weight: 51.982 kg (114 lb 9.6 oz) 53.978 kg (119 lb)    Exam:   General:  awake  Chest: Clear breath sounds bilaterally  CVS: S1 and S2  irregular, no murmurs, rubs or gallop  Abdomen: Soft, nondistended, nontender, bowel sounds present  Extremities: Warm, no edema, no rash or purpura   Data Reviewed: Basic Metabolic Panel:  Recent Labs Lab 08/26/14 0351 08/27/14 0450 08/28/14 0315 08/29/14 0350 08/30/14 0520  NA 142 142 140 143 140  K 3.8 3.5 3.4* 3.3* 3.4*  CL 105 110 108 109 108  CO2 23 24 21 19  18*  GLUCOSE 94 101* 87 78 73  BUN 46* 51* 53* 53* 52*  CREATININE 3.01* 3.38* 3.38* 3.29* 3.07*  CALCIUM 8.8 8.8 8.6 8.8 8.4  PHOS  --  4.7* 4.5 4.5 4.4   Liver Function Tests:  Recent Labs Lab 08/27/14 0450 08/28/14 0315 08/29/14 0350 08/30/14 0520  ALBUMIN 1.8* 2.0* 2.0* 2.0*   No results for input(s): LIPASE, AMYLASE in the last 168 hours.  Recent Labs Lab 08/26/14 1430  AMMONIA 18   CBC:  Recent Labs Lab 08/24/14 0415 08/25/14 0522 08/26/14 0351 08/30/14 0520  WBC 13.3* 13.3* 9.8 11.1*  HGB 10.5* 9.7* 9.0* 7.6*  HCT 30.5* 28.6* 27.3* 22.3*  MCV  86.9 85.4 87.5 84.8  PLT 64* 62* 64* 89*   Cardiac Enzymes: No results for input(s): CKTOTAL, CKMB, CKMBINDEX, TROPONINI in the last 168 hours. BNP (last 3 results) No results for input(s): PROBNP in the last 8760 hours. CBG:  Recent Labs Lab 08/26/14 1352  GLUCAP 93    Recent Results (from the past 240 hour(s))  Clostridium Difficile by PCR     Status: None   Collection Time: 08/22/14  3:11 PM  Result Value Ref Range Status   C difficile by pcr NEGATIVE NEGATIVE Final     Studies: No results found.  Scheduled Meds: . antiseptic oral rinse  7 mL Mouth Rinse BID  . aspirin  81 mg Oral Daily  . feeding supplement (ENSURE COMPLETE)  237 mL Oral BID BM  . feeding supplement (RESOURCE BREEZE)  1 Container Oral TID BM  . folic acid  1 mg Oral Daily  . QUEtiapine  12.5 mg Oral BID  . sodium bicarbonate  650 mg Oral TID  . sodium chloride  3 mL Intravenous Q12H  . thiamine  100 mg Oral Daily   Continuous Infusions: . sodium chloride 75 mL/hr at 08/28/14 1012      Time spent: 25 minutes    Eulogio Bear  [facility] Pager [phone] If 7PM-7AM, please contact night-coverage at [email or web address], password [facility] 08/30/2014, 9:14 AM  LOS: 14 days

## 2014-08-31 DIAGNOSIS — C3400 Malignant neoplasm of unspecified main bronchus: Secondary | ICD-10-CM

## 2014-08-31 DIAGNOSIS — R531 Weakness: Secondary | ICD-10-CM

## 2014-08-31 LAB — RENAL FUNCTION PANEL
Albumin: 1.9 g/dL — ABNORMAL LOW (ref 3.5–5.2)
Anion gap: 13 (ref 5–15)
BUN: 50 mg/dL — ABNORMAL HIGH (ref 6–23)
CO2: 21 mmol/L (ref 19–32)
Calcium: 8.3 mg/dL — ABNORMAL LOW (ref 8.4–10.5)
Chloride: 104 mmol/L (ref 96–112)
Creatinine, Ser: 2.76 mg/dL — ABNORMAL HIGH (ref 0.50–1.10)
GFR calc Af Amer: 19 mL/min — ABNORMAL LOW
GFR calc non Af Amer: 16 mL/min — ABNORMAL LOW
Glucose, Bld: 95 mg/dL (ref 70–99)
Phosphorus: 4 mg/dL (ref 2.3–4.6)
Potassium: 3.4 mmol/L — ABNORMAL LOW (ref 3.5–5.1)
Sodium: 138 mmol/L (ref 135–145)

## 2014-08-31 LAB — CBC
HCT: 21.8 % — ABNORMAL LOW (ref 36.0–46.0)
Hemoglobin: 7.4 g/dL — ABNORMAL LOW (ref 12.0–15.0)
MCH: 28.4 pg (ref 26.0–34.0)
MCHC: 33.9 g/dL (ref 30.0–36.0)
MCV: 83.5 fL (ref 78.0–100.0)
Platelets: 94 10*3/uL — ABNORMAL LOW (ref 150–400)
RBC: 2.61 MIL/uL — ABNORMAL LOW (ref 3.87–5.11)
RDW: 20.4 % — ABNORMAL HIGH (ref 11.5–15.5)
WBC: 10 10*3/uL (ref 4.0–10.5)

## 2014-08-31 MED ORDER — SODIUM BICARBONATE 650 MG PO TABS: 650.0000 mg | ORAL_TABLET | Freq: Three times a day (TID) | ORAL | Status: DC

## 2014-08-31 MED ORDER — DEXTROSE 5 % IV SOLN
INTRAVENOUS | Status: DC
  Administered 2014-08-31: 12:00:00 via INTRAVENOUS
  Filled 2014-08-31: qty 1000

## 2014-08-31 NOTE — Progress Notes (Signed)
[facility] PROGRESS NOTE  Madison [name] XTG:[id number] DOB: [birth year] DOA: 08/16/2014 PCP: Concha Norway, MD   Off service note:  69 year old female with history of squamous cell carcinoma of the lung previously treated with chemotherapy, completed on 07/14/2011, [facility], hypertension presented from home due to worsening confusion and mental status changes for one week. Patient had increased difficulty removing remaining appointments, history of loose stools, flank pain and weight loss. On admission she was found to be hyperkalemic with  UTI and sinus tachycardia in the ED. CT scan of the abdomen and pelvis showed necrotic lymph node suspicious for recurrence of her prior lung cancer. She was admitted for further management. Her hemoglobin dropped to 6.9 on the second day of admission. She was transfused with 2 units PRBC. She was also given IV fluids. Hospital course complicated due to development of lung infiltrate, new-onset Afib with RVR, hypotension and new onset acute systolic CHF.  Family has been working with palliative care but has been unable to make any decisions.  Patient now not eating.  Dr. Eliseo Squires discussed with son who is MPOA that there was nothing else for [place] to offer as she is not eating and they need to consider DNR status and comfort care  Assessment/Plan: Sepsis  Patient was  hypotensive and hypothermic with rapid A. Fib at admission.   Sepsis could be related to UTI versus pneumonia ( ?Aspiration) associated with acute encephalopathy. - Blood cx negative.narrowed abx to levaquin and dced  (received >7 days of abx)  Acute encephalopathy Thought to be related to metabolic acidosis and sepsis. However remains persistent. TSH, B12 and ammonia level normal. Pending RPR.  HIV ab negative. ordered thiamine level. Given  IV thiamine for 3 days (until 1/19). Paraneoplastic syndrome? -MRI brain negative for stroke or metastases.  D/c seroquel  New-onset acute systolic CHF EF of 27%  with global hypokinesia. Per cardiology differential is broad secondary to  multivessel coronary disease versus diabetic cardiomyopathy versus takatsubo cardiomyopathy. Not a good candidate for cardiac catheterization or bypass surgery given her possible lung cancer recurrence , advanced chronic kidney disease, anemia and sepsis. - Not on ACE inhibitor, Lasix or Aldactone due to borderline blood pressure and AKI. -holding coreg due to hypotension   New-onset A. fib with RVR - Her CHADS VASC score is 4 . Was on coumadin in 2012 for afib which was dced as she did not have any recurrence. Given anemia and recurrence of lung ca, cardiology feel she is not a candidate for long term anticoagulation.   Thrombocytopenia Likely secondary to sepsis and medications . Progressive drop in plt noted. Discontinued heparin products and place SCDs. She was on vancomycin for sepsis and has been discontinued.  HIT ab negative. Seen by hematology and recommended to monitor for now.  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 Non anion gap acidosis resolved. Renal function slightly improved  hypokalemia  Elevated troponin Possibly demand ischemia with underlying acute CHF and rapid A. Fib. Continue aspirin.  Hypotension Required periodic IV fluid bolus. Cardiac meds held. Normotensive today  Left lower lobe pneumonia with small pleural effusion Continue O2 via nasal cannula. Transitioned to  oral levaquin and treated  History of non-small cell lung cancer status post chemotherapy (completed in 2012), with CT evidence of recurrence. CT chest shows large soft tissue mass in the lower middle mediastinum representing an enlarged lymph node . Not in acute distress. Hospitalist discussed with patient's oncologist Dr. Julien Nordmann and  recommended outpatient  follow-up.  Anemia of  chronic disease Hemoglobin stable after 2 units PRBC transfused.  SLP eval- DYS diet  Prognosis  poor. palliative care consulted and will f/u with family tomorrow. D/w Dr. Deitra Mayo   Code Status:full code  Family Communication: Dr. Eliseo Squires discussed with son [phone]  Disposition Plan:  pending   Consultants:  PCCM  Cardiology  Procedures:  2-D echo  MRI brain  Antibiotics:  IV vancomycin and cefepime 1/8-1/12  Levaquin 1/12-1/16   HPI/Subjective: confused     Objective: Filed Vitals:   08/31/14 1525  BP: 112/77  Pulse: 71  Temp: 96.3 F (35.7 C)  Resp: 20    Intake/Output Summary (Last 24 hours) at 08/31/14 1539 Last data filed at 08/31/14 1014  Gross per 24 hour  Intake    550 ml  Output    326 ml  Net    224 ml   Filed Weights    Exam:   General:  Asleep. Wont answer questions of follow commands. Pushes my hand away when I examine  Chest: Clear breath sounds bilaterally  CVS: S1 and S2  irregular, no murmurs, rubs or gallop  Abdomen: Soft, nondistended, nontender, bowel sounds present  Extremities: Warm, no edema, no rash or purpura   Data Reviewed: Basic Metabolic Panel:  Recent Labs Lab 08/27/14 0450 08/28/14 0315 08/29/14 0350 08/30/14 0520 08/31/14 0540  NA 142 140 143 140 138  K 3.5 3.4* 3.3* 3.4* 3.4*  CL 110 108 109 108 104  CO2 24 21 19  18* 21  GLUCOSE 101* 87 78 73 95  BUN 51* 53* 53* 52* 50*  CREATININE 3.38* 3.38* 3.29* 3.07* 2.76*  CALCIUM 8.8 8.6 8.8 8.4 8.3*  PHOS 4.7* 4.5 4.5 4.4 4.0   Liver Function Tests:  Recent Labs Lab 08/27/14 0450 08/28/14 0315 08/29/14 0350 08/30/14 0520 08/31/14 0540  ALBUMIN 1.8* 2.0* 2.0* 2.0* 1.9*   No results for input(s): LIPASE, AMYLASE in the last 168 hours.  Recent Labs Lab 08/26/14 1430  AMMONIA 18   CBC:  Recent Labs Lab 08/25/14 0522 08/26/14 0351 08/30/14 0520 08/31/14 0540  WBC 13.3* 9.8 11.1* 10.0  HGB 9.7* 9.0* 7.6* 7.4*  HCT 28.6* 27.3* 22.3* 21.8*  MCV 85.4 87.5 84.8 83.5  PLT 62* 64* 89* 94*   Cardiac Enzymes: No results for  input(s): CKTOTAL, CKMB, CKMBINDEX, TROPONINI in the last 168 hours. BNP (last 3 results) No results for input(s): PROBNP in the last 8760 hours. CBG:  Recent Labs Lab 08/26/14 1352  GLUCAP 93    Recent Results (from the past 240 hour(s))  Clostridium Difficile by PCR     Status: None   Collection Time: 08/22/14  3:11 PM  Result Value Ref Range Status   C difficile by pcr NEGATIVE NEGATIVE Final     Studies: Dg Abd 1 View  08/30/2014   CLINICAL DATA:  Initial encounter for diarrhea and left-sided abdominal pain, onset this morning.  EXAM: ABDOMEN - 1 VIEW  COMPARISON:  Ultrasound 08/26/2014.  CT 08/16/2014.  FINDINGS: The bowel gas pattern is normal. No radio-opaque calculi or other significant radiographic abnormality are seen.  IMPRESSION: Negative.   Electronically Signed   By: Andreas Newport M.D.   On: 08/30/2014 12:48    Scheduled Meds: . antiseptic oral rinse  7 mL Mouth Rinse BID  . aspirin  81 mg Oral Daily  . feeding supplement (ENSURE COMPLETE)  237 mL Oral BID BM  . feeding supplement (RESOURCE BREEZE)  1 Container Oral TID BM  . folic acid  1 mg Oral Daily  . QUEtiapine  12.5 mg Oral BID  . sodium bicarbonate  650 mg Oral TID  . sodium chloride  3 mL Intravenous Q12H  . thiamine  100 mg Oral Daily   Continuous Infusions: . dextrose 5 % 1,000 mL with sodium bicarbonate 100 mEq infusion 10 mL/hr at 08/31/14 1200   Time spent: 25 minutes  [facility] [email or web address], password [facility] 08/31/2014, 3:39 PM  LOS: 15 days

## 2014-08-31 NOTE — Progress Notes (Signed)
[facility] Rounding Note Subjective:   Brighter today but still confused Says ate breakfast but can't remember what Nurse tech says she has "not been making sense"  Dr. Eliseo Squires had conversation with son who has medical POA about considering DNR status and comfort care I am not sure what the final decision is/will be  Objective Vital signs in last 24 hours: Filed Vitals:   08/30/14 1811 08/30/14 2000 08/30/14 2345 08/31/14 0400  BP: 110/61 120/66 115/68 114/74  Pulse:  76 83 84  Temp:  98.7 F (37.1 C) 98.2 F (36.8 C) 98.8 F (37.1 C)  TempSrc:      Resp: 15 18 16 13   Height:      SpO2:  97% 97% 97%   Weight change:   Intake/Output Summary (Last 24 hours) at 08/31/14 1054 Last data filed at 08/31/14 1014  Gross per 24 hour  Intake    550 ml  Output    326 ml  Net    224 ml    Physical Exam:  BP 114/74 mmHg  Pulse 84  Temp(Src) 98.8 F (37.1 C) (Axillary)  Resp 13  Ht 5\' 6"  (1.676 m)  Wt 53.978 kg (119 lb)  BMI 19.22 kg/m2  SpO2 97% Gen: Frail thin AAF  Pleasant bu confused Has right sided portacath - accessed Now with bilateral arm edema Skin: no rash, cyanosis Neck: + JVD but lying flat Chest: Grossly clear and no increased WOB Heart: Regular   S1S2 No S3 No audible murmur.  Abdomen: + BS Less tender than yesterday Ext: + bilateral arm edema and now some LE post thigh edema, pedal edema  Recent Labs Lab 08/25/14 0522 08/26/14 0351 08/27/14 0450 08/28/14 0315 08/29/14 0350 08/30/14 0520 08/31/14 0540  NA 139 142 142 140 143 140 138  K 3.7 3.8 3.5 3.4* 3.3* 3.4* 3.4*  CL 106 105 110 108 109 108 104  CO2 28 23 24 21 19  18* 21  GLUCOSE 96 94 101* 87 78 73 95  BUN 39* 46* 51* 53* 53* 52* 50*  CREATININE 2.72* 3.01* 3.38* 3.38* 3.29* 3.07* 2.76*  CALCIUM 8.8 8.8 8.8 8.6 8.8 8.4 8.3*  PHOS  --   --  4.7* 4.5 4.5 4.4 4.0    Recent Labs Lab 08/29/14 0350 08/30/14 0520 08/31/14 0540  ALBUMIN 2.0* 2.0* 1.9*    Recent Labs Lab  08/26/14 1430  AMMONIA 18    Recent Labs Lab 08/25/14 0522 08/26/14 0351 08/30/14 0520 08/31/14 0540  WBC 13.3* 9.8 11.1* 10.0  HGB 9.7* 9.0* 7.6* 7.4*  HCT 28.6* 27.3* 22.3* 21.8*  MCV 85.4 87.5 84.8 83.5  PLT 62* 64* 89* 94*    Recent Labs Lab 08/26/14 1352  GLUCAP 93   Results for KAYDENSE, RIZO (MRN [id number]) as of 08/27/2014 08:24  Ref. Range 08/26/2014 16:35  Sodium, Ur Latest Units: mmol/L 10  Creatinine, Urine Latest Units: mg/dL 175.51  Calculated fractional sodium excretion 0.14%   Medications:   . antiseptic oral rinse  7 mL Mouth Rinse BID  . aspirin  81 mg Oral Daily  . feeding supplement (ENSURE COMPLETE)  237 mL Oral BID BM  . feeding supplement (RESOURCE BREEZE)  1 Container Oral TID BM  . folic acid  1 mg Oral Daily  . QUEtiapine  12.5 mg Oral BID  . sodium bicarbonate  650 mg Oral TID  . sodium chloride  3 mL Intravenous Q12H  . thiamine  100 mg Oral Daily  Background: 69 yo AAF with CKD3 (baseline creatinine 1.8-2 Sept 2015) admitted with encephalopathy, ?UTI (pyuria and bacteruria without predominant organism on culture), AF with RVR, new cardiomyopathy with EF 20-25% by ECHO (previously around 55%), possible lung cancer recurrent with mediastinal and gastrohepatic ligament adenopathy on CT scan, who developed AKI on CKD with creatinine up to 3, with oliguria, at time of consultation 1/19. Peak 3.38.  Impression/Plan  1. AKI on CKD3 - Small echodense kidneys. UA on admission without proteinuria. Prior CKD attributed to nephrosclerosis. Looked clinically dry at time of initial consult (although some small effusions on CXR). Has new systolic dysfunction with low EF and had low BP's with likely cardiorenal component. Pre-renal/hypoperfusion state supported by low fractional sodium excretion superimposed on CKD 3 with bilaterally small kidneys and no renal reserve. Creatinine minimally improved and not much change in GFR.  Still makes very little urine.   BP better. Not sure what endpoint is here. (Family has been advised to consider DNR and comfort care)  With her severe systolic heart failure and recurrent lung cancer would not be considered a candidate for long term dialysis.  I hope she will continue to recover some kidney function. Stop IVF.   2. Hypokalemia - 3 runs of K yesterday, give 3 runs today 3. Metabolic acidosis - Stop IVF with bicarb (some vol excess now) Change back to po bicarb starting tomorrow if remains more awake though still may not be able to swallow pills. 4. Encephalopathy - More alert but remains confused. Scan neg.   5. Cardiomyopathy with severe systolic dysfunction, EF 57% 6. AF with RVR - rate controlled. In NSR now. Carvedilol was stopped for BP reasons. 7. Abdominal pain - KUB unremarkable. Pain seems better. 8. H/o SCCA lung with recurrence - not restaged and if comfort care pursued will not be 9. Anemia - profound on admission. Post transfusion. No iron deficiency. Hb ack down to 7.4. Will likely need additional transfusion. No obvious source of blood loss 10. Thrombocytopenia - persistent despite treatment of GU and pulm infections.  11. Right upper extremity edema - on same side as portacath. No DVT on duplex. Now bilateral. 12. Disposition - ? SNF. Family to consider DNR and comfort care.   Jamal Maes, MD [facility] [phone] pager 08/31/2014, 10:54 AM

## 2014-08-31 NOTE — Clinical Social Work Note (Signed)
CSW continues to follow for DC needs. Patient has bed with [facility] when medically ready for DC.   Liz Beach MSW, [place], [place], [phone]

## 2014-09-01 LAB — RENAL FUNCTION PANEL
Albumin: 2.1 g/dL — ABNORMAL LOW (ref 3.5–5.2)
Anion gap: 11 (ref 5–15)
BUN: 49 mg/dL — ABNORMAL HIGH (ref 6–23)
CALCIUM: 8.7 mg/dL (ref 8.4–10.5)
CHLORIDE: 107 mmol/L (ref 96–112)
CO2: 24 mmol/L (ref 19–32)
CREATININE: 2.56 mg/dL — AB (ref 0.50–1.10)
GFR calc Af Amer: 21 mL/min — ABNORMAL LOW (ref >90)
GFR calc non Af Amer: 18 mL/min — ABNORMAL LOW (ref >90)
Glucose, Bld: 127 mg/dL — ABNORMAL HIGH (ref 70–99)
PHOSPHORUS: 4.2 mg/dL (ref 2.3–4.6)
Potassium: 3.7 mmol/L (ref 3.5–5.1)
Sodium: 142 mmol/L (ref 135–145)

## 2014-09-01 LAB — RPR: RPR Ser Ql: NONREACTIVE

## 2014-09-01 MED ORDER — MORPHINE SULFATE 2 MG/ML IJ SOLN: 1.0000 mg | INTRAMUSCULAR | Status: DC | PRN

## 2014-09-01 MED ORDER — MORPHINE SULFATE 2 MG/ML IJ SOLN
1.0000 mg | INTRAMUSCULAR | Status: DC | PRN
  Administered 2014-09-02: 1 mg via INTRAVENOUS
  Filled 2014-09-01: qty 1

## 2014-09-01 MED ORDER — LORAZEPAM 2 MG/ML IJ SOLN: 1.0000 mg | INTRAMUSCULAR | Status: DC | PRN

## 2014-09-01 MED ORDER — ATROPINE SULFATE 1 % OP SOLN
2.0000 [drp] | Freq: Four times a day (QID) | OPHTHALMIC | Status: DC | PRN
  Administered 2014-09-02 (×2): 2 [drp] via SUBLINGUAL
  Filled 2014-09-01: qty 2

## 2014-09-01 NOTE — Progress Notes (Signed)
40 min discussion with family (son, Karle Starch, husband, 2 sistters) about poor prognosis, continued decline despite aggressive care, not a candidate for chemo, MSOF.  They report pt voiced she said she did not want life support. They report she was afraid of needles and wish to d/c all tests, stop all but comfort measures, DNR, and residential hospice. Prefer [employer].  Notified SW.  Doree Barthel, MD

## 2014-09-01 NOTE — Progress Notes (Signed)
Physical Therapy Treatment/Discharge Patient Details Name: Madison [name] MRN: [id number] DOB: 06/19/1946 Today's Date: 09/01/2014    History of Present Illness 69 y.o. female admitted to [facility] on 08/16/14 with AMS and A-fib.  Pt dx with metabolic acidosis and sepsis (UTI vs PNA with associated acute encephaolopathy), new onset CHF, new onset a-fib with RVR, hypotension, acute on chronic Stage III-IV CKD, elevated triponin (likely demand ischemia), and likely recurrent lung CA (based on CT findings).  Pt with other significant PMHx of HTN, and lung CA for which she recieved chemotherapy tx in 2012.       PT Comments    Pt is not progressing towards goals.  She continues to be very confused and fearful of falling even with transitions to EOB.  She tolerated PROM of bil lower and upper extremities well.  Noticed after session that PT order had been d/c'd and checked in with MD who stated that family is seeking comfort care only and that PT should sign off.  PT will sign off due to MD recommendations.    Follow Up Recommendations  SNF     Equipment Recommendations  None recommended by PT    Recommendations for Other Services   NA     Precautions / Restrictions Precautions Precautions: Fall    Mobility  Bed Mobility Overal bed mobility: Needs Assistance;+2 for physical assistance Bed Mobility: Supine to Sit;Sit to Supine Rolling: +2 for physical assistance;Max assist Sidelying to sit: +2 for physical assistance;Max assist Supine to sit: +2 for physical assistance;Max assist Sit to supine: +2 for physical assistance;Max assist   General bed mobility comments: Two person max assist to support trunk and legs to come to sitting EOB.  Pt resisting forward motion due to fear of falling by pushing into extension with both her trunk, arms, and bil legs.   Transfers                 General transfer comment: Unable- would require a total lift.          Balance Overall balance  assessment: Needs assistance Sitting-balance support: Feet supported;Bilateral upper extremity supported Sitting balance-Leahy Scale: Zero Sitting balance - Comments: PT sat EOB and could get to min assist level from max of two person assist for ~20 seconds x 3 boughts.  Pt mostly pushing posteriorly for fear of falling Postural control: Posterior lean                          Cognition Arousal/Alertness: Awake/alert Behavior During Therapy: Restless Overall Cognitive Status: History of cognitive impairments - at baseline (more impaired than baseline)                      Exercises General Exercises - Upper Extremity Shoulder Flexion: PROM;Both;10 reps;Supine Elbow Flexion: PROM;Both;10 reps;Supine Wrist Flexion: PROM;Both;10 reps;Supine Wrist Extension: PROM;Both;10 reps;Supine General Exercises - Lower Extremity Ankle Circles/Pumps: PROM;Both;10 reps;Supine Heel Slides: PROM;Both;10 reps;Supine Hip ABduction/ADduction: PROM;Both;10 reps;Supine Other Exercises Other Exercises: Hip internal rotation PROM both x 10 reps supine (did this because in sitting pt kept bil legs both very externally rotated and was unable to bring knees together).     General Comments General comments (skin integrity, edema, etc.): At one point the monitor had her HR in the 45-55 bpm range.  She was back up in the 90s quickly.       Pertinent Vitals/Pain Pain Assessment: Faces Faces Pain Scale: Hurts a little bit Pain  Location: with ROM and mobility of bil legs Pain Descriptors / Indicators: Grimacing Pain Intervention(s): Limited activity within patient's tolerance;Monitored during session;Repositioned           PT Goals (current goals can now be found in the care plan section) Acute Rehab PT Goals Patient Stated Goal: unable to state.  Per MD, family seeking comfort care.  Progress towards PT goals: Not progressing toward goals - comment (pt not progressing, MD asked for d/c of  PT. )    Frequency  Other (Comment) (d/c PT due to seeking comfort care)    PT Plan Other (comment) (MD asked for PT to be d/c- discoverd at end of session. )       End of Session Equipment Utilized During Treatment: Gait belt Activity Tolerance: Patient tolerated treatment well Patient left: in bed;with call bell/phone within reach;with family/visitor present (in bed in sitting position. )     Time: 1533-1601 PT Time Calculation (min) (ACUTE ONLY): 28 min  Charges:  $Therapeutic Activity: 23-37 mins            Nadya Hopwood B. [place], [place], DPT [phone]   09/01/2014, 4:28 PM

## 2014-09-01 NOTE — Progress Notes (Signed)
NUTRITION FOLLOW UP  DOCUMENTATION CODES Per approved criteria  -Severe malnutrition in the context of acute illness or injury   Pt meets criteria for severe MALNUTRITION in the context of acute illness as evidenced by moderate fat and muscle depletion, 20.9% wt loss x 3 months.  Intervention:   Diet per SLP/MD. RD to assist with maximizing oral intake once patient able to safely take PO's.  New Nutrition Dx:   Inadequate oral intake related to altered mental status and dysphagia as evidenced by NPO status. Ongoing.  Goal:   Intake to meet >90% of estimated nutrition needs. Unmet.  Monitor:   PO intake, labs, weight trend.  Assessment:   Patient admitted on 1/9 with AMS and forgetfulness for one week with unexplained weight loss, diarrhea, and flank pain.   Patient now NPO due to decline in mental status, causing inability to safely take PO's. Palliative Care Team is following. Noted poor prognosis.  Height: Ht Readings from Last 1 Encounters:  08/16/14 5\' 6"  (1.676 m)    Weight Status:   08/27/14 119 lb  Re-estimated needs:  Kcal: 1400-1600 Protein: 70-80 gm Fluid: >/= 1.5 L  Skin: MASD to perineum   Diet Order: Diet NPO time specified   Intake/Output Summary (Last 24 hours) at 09/01/14 1506 Last data filed at 09/01/14 0600  Gross per 24 hour  Intake    120 ml  Output    375 ml  Net   -255 ml    Last BM: 1/25   Labs:   Recent Labs Lab 08/30/14 0520 08/31/14 0540 09/01/14 0500  NA 140 138 142  K 3.4* 3.4* 3.7  CL 108 104 107  CO2 18* 21 24  BUN 52* 50* 49*  CREATININE 3.07* 2.76* 2.56*  CALCIUM 8.4 8.3* 8.7  PHOS 4.4 4.0 4.2  GLUCOSE 73 95 127*    CBG (last 3)  No results for input(s): GLUCAP in the last 72 hours.  Scheduled Meds: . antiseptic oral rinse  7 mL Mouth Rinse BID  . aspirin  81 mg Oral Daily  . folic acid  1 mg Oral Daily  . sodium bicarbonate  650 mg Oral TID  . sodium chloride  3 mL Intravenous Q12H  . thiamine  100 mg  Oral Daily    Continuous Infusions: . dextrose 5 % 1,000 mL with sodium bicarbonate 100 mEq infusion 10 mL/hr at 08/31/14 [place], RD, LDN, [place] Pager [phone] After Hours Pager [phone]

## 2014-09-01 NOTE — Plan of Care (Signed)
Problem: SLP Dysphagia Goals Goal: Patient will utilize recommended strategies Patient will utilize recommended strategies during swallow to increase swallowing safety with  Outcome: Not Applicable Date Met:  90/37/95 Downgraded due to change in status.   Problem: SLP Dysphagia Goals Goal: Patient will utilize recommended strategies Patient will utilize recommended strategies during swallow to increase swallowing safety with  Outcome: Not Applicable Date Met:  58/31/67 Downgraded due to change in status  Problem: SLP Dysphagia Goals Goal: Patient will utilize recommended strategies Patient will utilize recommended strategies during swallow to increase swallowing safety with  Outcome: Not Applicable Date Met:  42/55/25 Downgraded due to change in status

## 2014-09-01 NOTE — Progress Notes (Signed)
Subjective: Interval History: has no complaint, confused and not purposeful.  Objective: Vital signs in last 24 hours: Temp:  [96.3 F (35.7 C)-97.9 F (36.6 C)] 97.9 F (36.6 C) (01/25 0519) Pulse Rate:  [71-89] 89 (01/25 0519) Resp:  [20-21] 20 (01/25 0519) BP: (108-113)/(66-88) 113/88 mmHg (01/25 0519) SpO2:  [95 %-98 %] 98 % (01/25 0519) Weight change:   Intake/Output from previous day: 01/24 0701 - 01/25 0700 In: 120 [I.V.:120] Out: 376 [Urine:375; Stool:1] Intake/Output this shift:    General appearance: uncooperative and confused, not coop, moaning Resp: rhonchi bilaterally Cardio: S1, S2 normal GI: pos bs, soft, liver down 5 cm,  moans when touched anywhere Extremities: edema 1+  Lab Results:  Recent Labs  08/30/14 0520 08/31/14 0540  WBC 11.1* 10.0  HGB 7.6* 7.4*  HCT 22.3* 21.8*  PLT 89* 94*   BMET:  Recent Labs  08/31/14 0540 09/01/14 0500  NA 138 142  K 3.4* 3.7  CL 104 107  CO2 21 24  GLUCOSE 95 127*  BUN 50* 49*  CREATININE 2.76* 2.56*  CALCIUM 8.3* 8.7   No results for input(s): PTH in the last 72 hours. Iron Studies: No results for input(s): IRON, TIBC, TRANSFERRIN, FERRITIN in the last 72 hours.  Studies/Results: Dg Abd 1 View  08/30/2014   CLINICAL DATA:  Initial encounter for diarrhea and left-sided abdominal pain, onset this morning.  EXAM: ABDOMEN - 1 VIEW  COMPARISON:  Ultrasound 08/26/2014.  CT 08/16/2014.  FINDINGS: The bowel gas pattern is normal. No radio-opaque calculi or other significant radiographic abnormality are seen.  IMPRESSION: Negative.   Electronically Signed   By: Andreas Newport M.D.   On: 08/30/2014 12:48    I have reviewed the patient's current medications.  Assessment/Plan: 1 CKD 3-4/AKI  Presumably hypoperfusion, sepsis vs cardiac. Steady slow improvement , not sure what new baseline will be.   2 Anemia  3 Pneu on Levaquin 4 UTI  6 confusion  Sickness, ? Underlying dementia 7 PAF 8 Lung Ca P will s/o  at this time and see again at your request   LOS: 16 days   Kinsey Cowsert L 09/01/2014,9:00 AM

## 2014-09-01 NOTE — Progress Notes (Signed)
Speech Language Pathology Treatment: Dysphagia  Patient Details Name: NAKYIA DAU MRN: [id number] DOB: 11-06-1945 Today's Date: 09/01/2014 Time: 5997-7414 SLP Time Calculation (min) (ACUTE ONLY): 11 min  Assessment / Plan / Recommendation Clinical Impression  Discussed patient with RN prior to treatment today. Patient now NPO due to overt s/s of aspiration noted by RN with recommended diet. Diagnostic po trials provided by SLP today. Patient with a cognitively based dysphagia characterized by oral holding without attempts/ability to posteriorly transit pos for initiation of swallow despite max SLP verbal and physical intervention. Even with self feeding of a patient requested liquid, patient unable to orally transit bolus, requiring eventual oral suctioning. Again, at this time, mentation is largest barrier to ability to efficiently consume pos. SLP will continue to f/u. Noted pending palliative care consult to assist with goals of care.    HPI HPI: 69 year old female with a history of squamous cell carcinoma of the lung previously treated with chemotherapy which she completed 07/14/2011, CKD, hypertension presented from home secondary to increasing forgetfulness and mental status change for one week. Diagnosed UTI.  CT of the chest revealed a new 4.1 x 2.7 soft tissue mass in the lower mediastinum. MRI- no sign of stroke. MBS 2012 no aspiration. Swallow evaluation 08/19/14 with functional swallow impacted primarily by poor mentation.  Regular diet, thin liquids recommended.  F/u noted oral holding of POs due to inattention and MS, but no s/s of aspiration.  Pt D/Cd from SLP services 08/22/14.  Scheduled for D/C to SNF, but developed rapid afib.  Acute systolic CHF/ new cardiomyopathy with EF of 20-25%. Now with continued worsening encephalopathy, eating very litte, hence SLP swallow re-ordered.    Pertinent Vitals Pain Assessment: Faces Faces Pain Scale: No hurt  SLP Plan  Goals updated     Recommendations Diet recommendations: NPO Medication Administration: Via alternative means              Oral Care Recommendations:  (QID) Follow up Recommendations: Skilled Nursing facility Plan: Goals updated    [place], [place] [phone]  Chandrea Zellman Meryl 09/01/2014, 10:24 AM

## 2014-09-01 NOTE — Progress Notes (Addendum)
Met with patients family and Dr Conley Canal, family accepting of comfort care measures, expressed desire to keep pts wishes of not being on life support, would like patient to be kept comfortable.

## 2014-09-02 MED ORDER — ATROPINE SULFATE 1 % OP SOLN
2.0000 [drp] | Freq: Four times a day (QID) | OPHTHALMIC | Status: DC
  Administered 2014-09-02 – 2014-09-03 (×2): 2 [drp] via SUBLINGUAL
  Filled 2014-09-02: qty 2

## 2014-09-02 NOTE — Progress Notes (Addendum)
CSW received call from Dr. Conley Canal stating that patient's family has opted to seek residential hospice care for patient and she is appropriate for this level of care. CSW spoke with patient's husband Madison [name] via telephone to discuss residential hospice care.  He stated that the preferred provider would be [facility] if possible.  CSW discussed residential hospice list and stated that a full search for a bed must be completed in case [facility] is unable to accept patient. Mr. Zolman verbalized an understanding of this and denied, at this time- having a preference outside of [facility].  He agreed to full search for residential hospice placement. Patient is confused.  CSW left message for [place] Liaison re: referral for patient and awaiting call back re: possible bed availability.  Will follow up in the morning.  Lorie Phenix. Pauline Good, [place]

## 2014-09-02 NOTE — Progress Notes (Signed)
Attempted to feed patient DYS 1 diet.  Pt pocketing food and not swallowing, had to suction food from mouth and stopped feeding.  Will continue to monitor.

## 2014-09-02 NOTE — Progress Notes (Addendum)
CSW (Clinical Education officer, museum) notified by [employer] worker that pt is eligible. However, [employer] spoke with pt son and he stated that he cannot make the decision today and needs to speak with other family. CSW attempted to call pt son and left voicemail to offer assistance and inquire about delay. CSW understanding that pt family met with MD yesterday and were in agreement for comfort care at residential hospice. CSW also spoke with pt sister Joycelyn Schmid who confirmed decision was made yesterday but unable to speak to why pt son is undecided. Will continue to attempt to reach out to pt son. Will also notify MD.   ADDENDUM: CSW notified by MD that pt son understanding that pt will dc today. Pt son understanding and agreeable to [facility]. CSW notified Sales promotion account executive. Awaiting return phone call to confirm bed is still available.  ADDENDUM: Plan for pt to dc to [facility] on 09/03/2014. Facility confirmed plan. MD notified.   [place], [place]

## 2014-09-02 NOTE — Progress Notes (Signed)
[facility] PROGRESS NOTE  Madison [name] UTM:[id number] DOB: 11/27/1945 DOA: 08/16/2014 PCP: Concha Norway, MD  Continue comfort care. To [facility] tomorrow.  HPI/Subjective: confused     Objective: Filed Vitals:   09/02/14 1520  BP: 113/70  Pulse: 87  Temp:   Resp: 15    Intake/Output Summary (Last 24 hours) at 09/02/14 1712 Last data filed at 09/02/14 1424  Gross per 24 hour  Intake      0 ml  Output    252 ml  Net   -252 ml   Filed Weights    Exam:   General:  Awake. Speech unintelligable  Chest: Clear breath sounds bilaterally  CVS: S1 and S2  irregular, no murmurs, rubs or gallop  Abdomen: Soft, nondistended, nontender, bowel sounds present  Extremities: Warm, no edema, no rash or purpura   Data Reviewed: Basic Metabolic Panel:  Recent Labs Lab 08/28/14 0315 08/29/14 0350 08/30/14 0520 08/31/14 0540 09/01/14 0500  NA 140 143 140 138 142  K 3.4* 3.3* 3.4* 3.4* 3.7  CL 108 109 108 104 107  CO2 21 19 18* 21 24  GLUCOSE 87 78 73 95 127*  BUN 53* 53* 52* 50* 49*  CREATININE 3.38* 3.29* 3.07* 2.76* 2.56*  CALCIUM 8.6 8.8 8.4 8.3* 8.7  PHOS 4.5 4.5 4.4 4.0 4.2   Liver Function Tests:  Recent Labs Lab 08/28/14 0315 08/29/14 0350 08/30/14 0520 08/31/14 0540 09/01/14 0500  ALBUMIN 2.0* 2.0* 2.0* 1.9* 2.1*   No results for input(s): LIPASE, AMYLASE in the last 168 hours. No results for input(s): AMMONIA in the last 168 hours. CBC:  Recent Labs Lab 08/30/14 0520 08/31/14 0540  WBC 11.1* 10.0  HGB 7.6* 7.4*  HCT 22.3* 21.8*  MCV 84.8 83.5  PLT 89* 94*   Cardiac Enzymes: No results for input(s): CKTOTAL, CKMB, CKMBINDEX, TROPONINI in the last 168 hours. BNP (last 3 results) No results for input(s): PROBNP in the last 8760 hours. CBG: No results for input(s): GLUCAP in the last 168 hours.  No results found for this or any previous visit (from the past 240 hour(s)).   Studies: No results found.  Scheduled Meds: .  sodium chloride  3 mL Intravenous Q12H   Continuous Infusions:   Time spent: 15 minutes  [facility] [email or web address], password [facility] 09/02/2014, 5:12 PM  LOS: 17 days

## 2014-09-02 NOTE — Progress Notes (Addendum)
CSW sent text message to Erling Conte, [place] Liaison this morning requesting call back to discuss patient and will assist with residential hospice home search. CSW notified Gayla Medicus, RN Liaison [facility] of search for residential hospice home search.  Patient's husband was given option to return to SNF with hospice services following patient but husband declines this option at this time.  Husband stated that he would keep his son Everette informed of current bed search and options.   Lorie Phenix. Pauline Good, [place]

## 2014-09-03 MED ORDER — ATROPINE SULFATE 1 % OP SOLN: 2.0000 [drp] | Freq: Four times a day (QID) | OPHTHALMIC | Status: AC

## 2014-09-03 NOTE — Progress Notes (Signed)
CSW (Clinical Education officer, museum) prepared pt dc packet and placed with shadow chart. CSW arranged non-emergent ambulance transport. Pt family, pt nurse, and facility informed. CSW signing off.  [place], [place]

## 2014-09-03 NOTE — Progress Notes (Signed)
Report called to nurse receiving pt at [name].

## 2014-09-03 NOTE — Consult Note (Signed)
[facility] Liaison: [place] room available for patient yesterday and today. Family unable to be present until late yesterday afternoon so admission planned for today. Son [place] completed paper work late yesterday afternoon. Dr. Orpah Melter to assume care per family request. Please fax discharge summary to [phone]. RN please call report to [phone]. Please arrange transport for patient to arrive before noon if possible. Thank you. Erling Conte LCSW [phone]

## 2014-09-03 NOTE — Discharge Summary (Signed)
Physician Discharge Summary  Madison [name] GYJ:[id number] DOB: March 20, 1946 DOA: 08/16/2014  PCP: Concha Norway, MD  Admit date: 08/16/2014 Discharge date: 09/03/2014  Time spent: greater than 30 minutes  Recommendations for Outpatient Follow-up:  1. To [facility] residential hospice for end of life care  Discharge Diagnoses:  Principal Problem:   Lung cancer Active Problems:   Anemia   CKD (chronic kidney disease) stage 3, GFR 26-37 ml/min   Metabolic acidosis   UTI (lower urinary tract infection)   Hyperkalemia   Altered mental status   Acute encephalopathy   Tachycardia   Elevated troponin   Abnormal EKG   Acute systolic congestive heart failure, NYHA class 4   Protein-calorie malnutrition, severe   Hypokalemia   Sepsis   Atrial fibrillation with RVR   Thrombocytopenia   Multifocal atrial tachycardia   Acute renal failure superimposed on stage 3 chronic kidney disease   DNR (do not resuscitate)   Dysphagia, pharyngoesophageal phase   Weakness generalized  Discharge Condition: stable for hospice care  Diet recommendation: may have comfort feeds as tolerated.  Filed Weights    History of present illness/Hospital Course:  69 year old female with history of squamous cell carcinoma of the lung previously treated with chemotherapy, completed on 07/14/2011, [facility], hypertension presented from home due to worsening confusion and mental status changes for one week. Patient had increased difficulty making remaining appointments, history of loose stools, flank pain and weight loss. On admission she was found to be hyperkalemic with UTI and sinus tachycardia in the ED. CT scan of the abdomen and pelvis showed necrotic lymph node suspicious for recurrence of her prior lung cancer. Subsequent CT chest showed large mediastinal mass, likely enlarged lymph node. Her hemoglobin dropped to 6.9 on the second day of admission. She was transfused with 2 units PRBC.   Hospital course complicated  due to development of lung infiltrate, new-onset Afib with RVR, hypotension and new onset acute systolic CHF. unable to safely swallow.  Assessment/Plan: Sepsis  Patient was hypotensive and hypothermic with rapid A. Fib at admission.  Sepsis could be related to UTI versus pneumonia ( ?Aspiration) associated with acute encephalopathy. - Blood cx negative.narrowed abx to levaquin and dced (received >7 days of abx)  Acute encephalopathy Thought to be related to metabolic acidosis and sepsis. However remains persistent. TSH, B12 and ammonia level normal..  -MRI brain negative for stroke or metastases. 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  New-onset acute systolic CHF EF of 85% with global hypokinesia. Per cardiology differential is broad secondary to multivessel coronary disease versus diabetic cardiomyopathy versus takatsubo cardiomyopathy. Not a good candidate for cardiac catheterization or bypass surgery given her possible lung cancer recurrence , advanced chronic kidney disease, anemia and sepsis. - Not on ACE inhibitor, Lasix or Aldactone due to borderline blood pressure and AKI.  New-onset A. fib with RVR - Her CHADS VASC score is 4 . Was on coumadin in 2012 for afib which was dced as she did not have any recurrence. Given anemia and recurrence of lung ca, cardiology feel she is not a candidate for long term anticoagulation.  Thrombocytopenia Likely secondary to sepsis and medications . Progressive drop in plt noted. Discontinued heparin products and place SCDs. She was on vancomycin for sepsis and has been discontinued. HIT ab negative. Seen by hematology  Acute on chronic Stage III-IV kidney disease Baseline creatinine of 1.8-2.1. Peaked above 3. Nephrology consulted. Not a candidate for hemodialysis  Elevated troponin Possibly demand ischemia  with underlying acute CHF and rapid A. Fib.   Hypotension Required periodic IV fluid bolus. Cardiac  meds stopped  Left lower lobe pneumonia with small pleural effusion Treated with full course antibiotics  History of non-small cell lung cancer status post chemotherapy (completed in 2012), with CT evidence of recurrence. CT chest shows large soft tissue mass in the lower middle mediastinum representing an enlarged lymph node . Not in acute distress. Hospitalist discussed with patient's oncologist Dr. Julien Nordmann and recommended outpatient follow-up.  Patient has declined steadily and is not a candidate for chemo. Patient is now comfort care and will be going to residential hospice. Prognosis a few weeks at best  Anemia of chronic disease s/p 2 units PRBC transfused.  Dysphagia: Patient was holding food, not swallowing. Speech therapy worked with the patient and initially she was able to take a few sips of thickened liquids and a few bites of pured. Has progressed to the point where she does not swallow at all. She cannot manage her own secretions. All oral medications have been stopped and she has really taken nothing by mouth for several weeks now.   Consultants:  [facility]  Cardiology  Hematology  Palliative care medicine  nephrology  Antibiotics:  IV vancomycin and cefepime 1/8-1/12  Levaquin 1/12-1/16   Discharge Exam: Filed Vitals:   09/02/14 1920  BP: 120/72  Pulse:   Temp:   Resp: 17    General: Asleep. Arousable. Words unintelligible. Appears profoundly weak. Does not follow commands. Cardiovascular: Regular rate rhythm without murmurs gallops rubs Respiratory: Diminished throughout without wheezes rhonchi or rales Extremities no clubbing cyanosis or edema  Discharge Instructions   Discharge Instructions    Bed rest    Complete by:  As directed           Current Discharge Medication List    START taking these medications   Details  atropine 1 % ophthalmic solution Place 2 drops under the tongue every 6 (six) hours. Qty: 2 mL, Refills: 12      STOP  taking these medications     diltiazem (CARDIZEM CD) 180 MG 24 hr capsule      folic acid (FOLVITE) 1 MG tablet      lidocaine-prilocaine (EMLA) cream      loratadine (CLARITIN) 10 MG tablet      methylPREDNIsolone (MEDROL DOSPACK) 4 MG tablet      potassium chloride SA (K-DUR,KLOR-CON) 20 MEQ tablet      Probiotic Product (PROBIOTIC PO)        Allergies  Allergen Reactions  . Lisinopril Swelling    Makes face swell      The results of significant diagnostics from this hospitalization (including imaging, microbiology, ancillary and laboratory) are listed below for reference.    Significant Diagnostic Studies: Ct Abdomen Pelvis Wo Contrast  08/16/2014   CLINICAL DATA:  Low back/right flank pain x3 days, history of lung cancer  EXAM: CT ABDOMEN AND PELVIS WITHOUT CONTRAST  TECHNIQUE: Multidetector CT imaging of the abdomen and pelvis was performed following the standard protocol without IV contrast.  COMPARISON:  CT abdomen pelvis dated 04/19/2012. Partial comparison to CT chest dated 10/23/2013.  FINDINGS: Lower chest:  Left lower lobe scarring/atelectasis.  3.5 x 3.0 cm lesion adjacent to the descending thoracic aorta (series 21/ image 1), measuring at the upper limits of normal for fluid density, incompletely visualized but new from prior CT. This appearance is worrisome for an necrotic node.  Hepatobiliary: Liver is unremarkable.  Gallbladder is  notable for layering gallstones (series 21/ image 23). No associated inflammatory changes. No intrahepatic or extrahepatic ductal dilatation.  Pancreas: Within normal limits.  Spleen: Within normal limits.  Adrenals/Urinary Tract: Adrenal glands are unremarkable.  Kidneys are grossly unremarkable, noting a 10 mm probable cyst along the right lower kidney (series 21/image 25). No renal calculi or hydronephrosis.  No ureteral or bladder calculi.  Bladder is unremarkable.  Stomach/Bowel: Stomach is notable for a small hiatal or paraesophageal  hernia (Series 21/ image 7).  No evidence of bowel obstruction.  Normal appendix.  Colonic diverticulosis, without associated inflammatory changes.  Vascular/Lymphatic: Atherosclerotic calcifications of the abdominal aorta and branch vessels.  1.8 cm short axis gastrohepatic node (series 21/ image 13), new.  Reproductive: Uterus is unremarkable.  No adnexal masses.  Other: No abdominopelvic ascites.  Musculoskeletal: Degenerative changes of the visualized thoracolumbar spine.  Mild superior endplate compression fracture deformity at L1, unchanged from prior CT chest.  IMPRESSION: No evidence of bowel obstruction.  Normal appendix.  Cholelithiasis, without associated inflammatory changes.  No renal, ureteral, or bladder calculi.  3.5 x 3.0 cm suspected necrotic node adjacent to the descending thoracic aorta, incompletely visualized. Additional 1.8 cm short axis gastrohepatic node. These are new from prior CT and suspicious for nodal metastases.  Consider dedicated CT of the chest for further evaluation.   Electronically Signed   By: Julian Hy M.D.   On: 08/16/2014 18:12   Dg Chest 2 View  08/26/2014   CLINICAL DATA:  Acute encephalopathy.  EXAM: CHEST  2 VIEW  COMPARISON:  08/20/2014  FINDINGS: Right jugular Port-A-Cath remains in place with tip new the cavoatrial junction. Cardiac silhouette appears mildly enlarged. The patient has taken a shallower inspiration than on the prior study and there are increased hazy and patchy opacities in both lung bases. No overt pulmonary edema or pneumothorax is identified. There are small bilateral pleural effusions which may have increased in size from the prior study although some of the differences could be due to differences in patient positioning. No acute osseous abnormality is seen.  IMPRESSION: Shallow inspiration with small bilateral pleural effusions and increased bibasilar lung opacities which may reflect atelectasis versus infectious infiltrate.    Electronically Signed   By: Logan Bores   On: 08/26/2014 16:56   Dg Abd 1 View  08/30/2014   CLINICAL DATA:  Initial encounter for diarrhea and left-sided abdominal pain, onset this morning.  EXAM: ABDOMEN - 1 VIEW  COMPARISON:  Ultrasound 08/26/2014.  CT 08/16/2014.  FINDINGS: The bowel gas pattern is normal. No radio-opaque calculi or other significant radiographic abnormality are seen.  IMPRESSION: Negative.   Electronically Signed   By: Andreas Newport M.D.   On: 08/30/2014 12:48   Ct Chest Wo Contrast  08/17/2014   CLINICAL DATA:  69 year old female with history of squamous cell carcinoma of the lung.  EXAM: CT CHEST WITHOUT CONTRAST  TECHNIQUE: Multidetector CT imaging of the chest was performed following the standard protocol without IV contrast.  COMPARISON:  Chest CT 10/23/2013.  FINDINGS: Mediastinum/Lymph Nodes: Right internal jugular single-lumen porta cath with tip terminating at the superior cavoatrial junction. Heart size is normal. Trace amount of pericardial fluid and/or thickening, unlikely to be of any hemodynamic significance at this time. No associated pericardial calcification. 4.1 x 2.7 cm soft tissue attenuation lesion in the middle mediastinum abutting the left side of the descending thoracic aorta (image 36 of series 201), and in close contact with the distal third of the  esophagus.  Lungs/Pleura: Chronic postradiation changes of mass-like fibrosis in the right perihilar region are unchanged. Small bilateral pleural effusions are new compared to the prior examination. Extensive volume loss, architectural distortion and airspace consolidation in the basal segments of the left lower lobe. Scattered micronodules in the lungs bilaterally, similar to the prior examination, with the largest of these clustered in the anterior aspect of the right upper lobe, similar to the prior examination, favored to reflect predominantly areas of mucoid impaction.  Musculoskeletal/Soft Tissues: Multiple  old vertebral body compression fractures, unchanged compared to the prior examination, involving predominantly the superior endplates of T2, T7, R67, T12 and L1, most severe at L1 where there is approximately 15% loss of anterior vertebral body height. There are no aggressive appearing lytic or blastic lesions noted in the visualized portions of the skeleton.  Upper Abdomen: Enlarged gastrohepatic ligament lymph node measuring 1.9 cm in short axis.  IMPRESSION: 1. Large soft tissue mass in the lower middle mediastinum measuring up to 4.1 x 2.7 cm, likely to represent an enlarged lymph node. Given the gastrohepatic ligament lymphadenopathy, findings are concerning for potential nodal spread of disease in this patient with history of lung cancer, but are nonspecific. Consider further evaluation with PET-CT and biopsy for further diagnostic and staging purposes. 2. New areas of volume loss, architectural distortion airspace consolidation in the left lower lobe, compatible with developing pneumonia or sequela of recent aspiration (this appears new compared to yesterday's examination). 3. Small bilateral pleural effusions layering dependently, also new compared to yesterday's examination. 4. Chronic postradiation changes of mass-like fibrosis in the perihilar aspect of the right lung. 5. Additional incidental findings, as above.   Electronically Signed   By: Vinnie Langton M.D.   On: 08/17/2014 13:05   Mr Brain Wo Contrast  08/17/2014   CLINICAL DATA:  Acute encephalopathy. Development of altered mental status and forgetfulness over the past week. History of lung cancer.  EXAM: MRI HEAD WITHOUT CONTRAST  TECHNIQUE: Multiplanar, multiecho pulse sequences of the brain and surrounding structures were obtained without intravenous contrast.  COMPARISON:  12/31/2010  FINDINGS: Due to patient's altered mental status and inability to follow instructions, images are overall moderately degraded by motion artifact despite  using more motion resistant imaging protocols. Due to patient's mental status, the examination was terminated prematurely, and axial T1 and coronal T2 weighted images were not obtained.  There is no evidence of acute infarct, intracranial hemorrhage, mass, midline shift, or extra-axial fluid collection. There is mild to moderate cerebral atrophy. Small foci of T2 hyperintensity in the cerebral white matter are grossly unchanged from the prior study and nonspecific but compatible with mild chronic small vessel ischemic disease. 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  Orbits are unremarkable. There is mild left maxillary sinus mucosal thickening. Mastoid air cells are clear. Distal left vertebral artery flow void again appears small, similar to prior. Other major intracranial vascular flow voids are preserved.  IMPRESSION: 1. Motion degraded, incomplete examination as above. No acute infarct. 2. Mild chronic small vessel ischemic disease and chronic lacunar infarcts as above.   Electronically Signed   By: Logan Bores   On: 08/17/2014 14:40   US Renal  08/26/2014   CLINICAL DATA:  Acute renal injury; hypertension  EXAM: RENAL/URINARY TRACT ULTRASOUND COMPLETE  COMPARISON:  CT abdomen and pelvis August 16, 2014 ; renal ultrasound April 19, 2012  FINDINGS:  Right Kidney:  Length: 8.7 cm. Echogenicity is increased. The renal cortical thickness is within normal limits. No mass or hydronephrosis visualized. There is a small amount of perinephric fluid on the right. There is no sonographically demonstrable calculus or ureterectasis.  Left Kidney:  Length: 9.3 cm. Echogenicity is increased. Renal cortical thickness is within normal limits. No mass, perinephric fluid, or hydronephrosis visualized. No sonographically demonstrable calculus or ureterectasis.  Bladder:  Appears normal for degree of bladder distention.  Incidental note  is made of a left pleural effusion.  IMPRESSION: Kidneys are rather small and echogenic consistent with medical renal disease. There is no hydronephrosis on either side. A small amount of perinephric fluid on the right is of uncertain etiology. There is a left pleural effusion.   Electronically Signed   By: Lowella Grip M.D.   On: 08/26/2014 15:21   Dg Chest Port 1 View  08/20/2014   CLINICAL DATA:  Shortness of breath, pneumonia  EXAM: PORTABLE CHEST - 1 VIEW  COMPARISON:  08/19/2014  FINDINGS: Cardiomediastinal silhouette is stable. Right IJ Port-A-Cath is unchanged in position. Slight improvement in aeration. Residual left base retrocardiac small atelectasis or infiltrate. No pulmonary edema.  IMPRESSION: Improvement in aeration. No pulmonary edema. Residual left base retrocardiac atelectasis or infiltrate.   Electronically Signed   By: Lahoma Crocker M.D.   On: 08/20/2014 08:18   Dg Chest Port 1 View  08/19/2014   CLINICAL DATA:  Acute respiratory failure  EXAM: PORTABLE CHEST - 1 VIEW  COMPARISON:  CT chest dated 08/17/2014  FINDINGS: Retrocardiac opacity, suspicious for pneumonia.  Patchy right lower lobe opacity, possibly atelectasis. Small bilateral pleural effusions. No frank interstitial edema.  The heart is top-normal in size.  Right chest power port terminates cavoatrial junction.  IMPRESSION: Retrocardiac opacity, suspicious for pneumonia.  Small bilateral pleural effusions.   Electronically Signed   By: Julian Hy M.D.   On: 08/19/2014 10:11   Dg Chest Port 1 View  08/16/2014   CLINICAL DATA:  Initial evaluation for tachycardia, history of hypertension and atrial fibrillation as well as lung cancer  EXAM: PORTABLE CHEST - 1 VIEW  COMPARISON:  10/23/2013  FINDINGS: Heart size and vascular pattern are normal. Left lung is clear. Abnormal opacity right perihilar region consistent with radiation therapy change as described on prior CT scan. Right Port-A-Cath noted. Right lung otherwise  clear.  IMPRESSION: No acute findings. Evidence of radiation change right parahilar region.   Electronically Signed   By: Skipper Cliche M.D.   On: 08/16/2014 19:07    Microbiology: No results found for this or any previous visit (from the past 240 hour(s)).   Labs: Basic Metabolic Panel:  Recent Labs Lab 08/28/14 0315 08/29/14 0350 08/30/14 0520 08/31/14 0540 09/01/14 0500  NA 140 143 140 138 142  K 3.4* 3.3* 3.4* 3.4* 3.7  CL 108 109 108 104 107  CO2 21 19 18* 21 24  GLUCOSE 87 78 73 95 127*  BUN 53* 53* 52* 50* 49*  CREATININE 3.38* 3.29* 3.07* 2.76* 2.56*  CALCIUM 8.6 8.8 8.4 8.3* 8.7  PHOS 4.5 4.5 4.4 4.0 4.2   Liver Function Tests:  Recent Labs Lab 08/28/14 0315 08/29/14 0350 08/30/14 0520 08/31/14 0540 09/01/14 0500  ALBUMIN 2.0* 2.0* 2.0* 1.9* 2.1*   No results for input(s): LIPASE, AMYLASE in the last 168 hours. No results for input(s): AMMONIA in the last 168 hours. CBC:  Recent Labs Lab 08/30/14 0520 08/31/14 0540  WBC 11.1* 10.0  HGB 7.6* 7.4*  HCT 22.3* 21.8*  MCV 84.8 83.5  PLT 89* 94*   Cardiac Enzymes: No results for input(s): CKTOTAL, CKMB, CKMBINDEX, TROPONINI in the last 168 hours. BNP: BNP (last 3 results) No results for input(s): PROBNP in the last 8760 hours. CBG: No results for input(s): GLUCAP in the last 168 hours.  SignedDelfina Redwood  [facility] 09/03/2014, 9:54 AM

## 2014-10-07 DEATH — deceased
